# Patient Record
Sex: Female | Born: 1935 | Race: White | Hispanic: No | Marital: Single | Smoking: Never smoker
Health system: Southern US, Community
[De-identification: ages and names within clinical notes are randomized; demographics above are authoritative.]

## PROBLEM LIST (undated history)

## (undated) DIAGNOSIS — K5792 Diverticulitis of intestine, part unspecified, without perforation or abscess without bleeding: Secondary | ICD-10-CM

## (undated) DIAGNOSIS — I10 Essential (primary) hypertension: Secondary | ICD-10-CM

## (undated) DIAGNOSIS — F419 Anxiety disorder, unspecified: Secondary | ICD-10-CM

## (undated) DIAGNOSIS — R0600 Dyspnea, unspecified: Secondary | ICD-10-CM

## (undated) DIAGNOSIS — M1990 Unspecified osteoarthritis, unspecified site: Secondary | ICD-10-CM

## (undated) DIAGNOSIS — H9190 Unspecified hearing loss, unspecified ear: Secondary | ICD-10-CM

## (undated) DIAGNOSIS — F32A Depression, unspecified: Secondary | ICD-10-CM

## (undated) DIAGNOSIS — F329 Major depressive disorder, single episode, unspecified: Secondary | ICD-10-CM

## (undated) DIAGNOSIS — R609 Edema, unspecified: Secondary | ICD-10-CM

## (undated) HISTORY — PX: ABDOMINAL HYSTERECTOMY: SHX81

## (undated) HISTORY — PX: HYSTERECTOMY ABDOMINAL WITH SALPINGECTOMY: SHX6725

## (undated) HISTORY — PX: APPENDECTOMY: SHX54

## (undated) HISTORY — PX: CHOLECYSTECTOMY: SHX55

## (undated) HISTORY — PX: BACK SURGERY: SHX140

---

## 2005-07-30 ENCOUNTER — Ambulatory Visit: Payer: Self-pay | Admitting: *Deleted

## 2005-08-07 ENCOUNTER — Emergency Department: Payer: Self-pay | Admitting: Emergency Medicine

## 2005-08-07 ENCOUNTER — Other Ambulatory Visit: Payer: Self-pay

## 2005-08-11 ENCOUNTER — Ambulatory Visit: Payer: Self-pay | Admitting: *Deleted

## 2006-06-21 ENCOUNTER — Ambulatory Visit: Payer: Self-pay | Admitting: Unknown Physician Specialty

## 2006-07-13 ENCOUNTER — Other Ambulatory Visit: Payer: Self-pay

## 2006-07-13 ENCOUNTER — Inpatient Hospital Stay: Payer: Self-pay | Admitting: Unknown Physician Specialty

## 2008-04-13 ENCOUNTER — Ambulatory Visit: Payer: Self-pay | Admitting: *Deleted

## 2009-07-12 ENCOUNTER — Emergency Department: Payer: Self-pay | Admitting: Emergency Medicine

## 2011-08-24 ENCOUNTER — Emergency Department: Payer: Self-pay | Admitting: Emergency Medicine

## 2011-12-02 ENCOUNTER — Ambulatory Visit: Payer: Self-pay | Admitting: Unknown Physician Specialty

## 2012-08-25 LAB — CBC
HCT: 47 % (ref 35.0–47.0)
HGB: 15.9 g/dL (ref 12.0–16.0)
MCH: 29.4 pg (ref 26.0–34.0)
MCHC: 33.9 g/dL (ref 32.0–36.0)
MCV: 87 fL (ref 80–100)
Platelet: 344 10*3/uL (ref 150–440)
RBC: 5.41 10*6/uL — ABNORMAL HIGH (ref 3.80–5.20)
RDW: 14 % (ref 11.5–14.5)
WBC: 8.5 10*3/uL (ref 3.6–11.0)

## 2012-08-25 LAB — COMPREHENSIVE METABOLIC PANEL
Albumin: 3.6 g/dL (ref 3.4–5.0)
Alkaline Phosphatase: 92 U/L (ref 50–136)
Anion Gap: 13 (ref 7–16)
BUN: 10 mg/dL (ref 7–18)
Bilirubin,Total: 0.7 mg/dL (ref 0.2–1.0)
Calcium, Total: 8.8 mg/dL (ref 8.5–10.1)
Chloride: 104 mmol/L (ref 98–107)
Co2: 23 mmol/L (ref 21–32)
Creatinine: 1.12 mg/dL (ref 0.60–1.30)
EGFR (African American): 56 — ABNORMAL LOW
EGFR (Non-African Amer.): 48 — ABNORMAL LOW
Glucose: 93 mg/dL (ref 65–99)
Osmolality: 278 (ref 275–301)
Potassium: 3.2 mmol/L — ABNORMAL LOW (ref 3.5–5.1)
SGOT(AST): 28 U/L (ref 15–37)
SGPT (ALT): 16 U/L (ref 12–78)
Sodium: 140 mmol/L (ref 136–145)
Total Protein: 7.8 g/dL (ref 6.4–8.2)

## 2012-08-25 LAB — URINALYSIS, COMPLETE
Bilirubin,UR: NEGATIVE
Glucose,UR: NEGATIVE mg/dL (ref 0–75)
Hyaline Cast: 3
Nitrite: POSITIVE
Ph: 6 (ref 4.5–8.0)
Protein: 30
RBC,UR: 2 /HPF (ref 0–5)
Specific Gravity: 1.019 (ref 1.003–1.030)
Squamous Epithelial: 1
WBC UR: 86 /HPF (ref 0–5)

## 2012-08-25 LAB — LIPASE, BLOOD: Lipase: 140 U/L (ref 73–393)

## 2012-08-26 LAB — MAGNESIUM: Magnesium: 2 mg/dL

## 2012-08-27 ENCOUNTER — Inpatient Hospital Stay: Payer: Self-pay | Admitting: Internal Medicine

## 2012-08-27 LAB — CBC WITH DIFFERENTIAL/PLATELET
Basophil #: 0.1 10*3/uL (ref 0.0–0.1)
Basophil %: 2.2 %
Eosinophil #: 0.2 10*3/uL (ref 0.0–0.7)
Eosinophil %: 3.9 %
HCT: 40.6 % (ref 35.0–47.0)
HGB: 13.5 g/dL (ref 12.0–16.0)
Lymphocyte #: 0.9 10*3/uL — ABNORMAL LOW (ref 1.0–3.6)
Lymphocyte %: 20.2 %
MCH: 28.9 pg (ref 26.0–34.0)
MCHC: 33.3 g/dL (ref 32.0–36.0)
MCV: 87 fL (ref 80–100)
Monocyte #: 0.5 x10 3/mm (ref 0.2–0.9)
Monocyte %: 10.7 %
Neutrophil #: 2.8 10*3/uL (ref 1.4–6.5)
Neutrophil %: 63 %
Platelet: 297 10*3/uL (ref 150–440)
RBC: 4.68 10*6/uL (ref 3.80–5.20)
RDW: 14 % (ref 11.5–14.5)
WBC: 4.5 10*3/uL (ref 3.6–11.0)

## 2012-08-27 LAB — COMPREHENSIVE METABOLIC PANEL
Albumin: 2.8 g/dL — ABNORMAL LOW (ref 3.4–5.0)
Alkaline Phosphatase: 82 U/L (ref 50–136)
Anion Gap: 8 (ref 7–16)
BUN: 4 mg/dL — ABNORMAL LOW (ref 7–18)
Bilirubin,Total: 0.6 mg/dL (ref 0.2–1.0)
Calcium, Total: 7.8 mg/dL — ABNORMAL LOW (ref 8.5–10.1)
Chloride: 112 mmol/L — ABNORMAL HIGH (ref 98–107)
Co2: 25 mmol/L (ref 21–32)
Creatinine: 0.94 mg/dL (ref 0.60–1.30)
EGFR (African American): >60
EGFR (Non-African Amer.): 60 — ABNORMAL LOW
Glucose: 91 mg/dL (ref 65–99)
Osmolality: 285 (ref 275–301)
Potassium: 3.7 mmol/L (ref 3.5–5.1)
SGOT(AST): 31 U/L (ref 15–37)
SGPT (ALT): 15 U/L (ref 12–78)
Sodium: 145 mmol/L (ref 136–145)
Total Protein: 6.1 g/dL — ABNORMAL LOW (ref 6.4–8.2)

## 2012-08-27 LAB — OCCULT BLOOD X 1 CARD TO LAB, STOOL: Occult Blood, Feces: NEGATIVE

## 2012-08-27 LAB — CLOSTRIDIUM DIFFICILE BY PCR

## 2012-08-28 LAB — WBCS, STOOL

## 2012-08-28 LAB — URINE CULTURE

## 2012-08-30 LAB — STOOL CULTURE

## 2012-08-31 LAB — CULTURE, BLOOD (SINGLE)

## 2013-03-27 ENCOUNTER — Inpatient Hospital Stay: Payer: Self-pay | Admitting: Student

## 2013-03-27 LAB — COMPREHENSIVE METABOLIC PANEL
Albumin: 3.3 g/dL — ABNORMAL LOW (ref 3.4–5.0)
Alkaline Phosphatase: 97 U/L (ref 50–136)
Anion Gap: 8 (ref 7–16)
BUN: 19 mg/dL — ABNORMAL HIGH (ref 7–18)
Bilirubin,Total: 0.6 mg/dL (ref 0.2–1.0)
Calcium, Total: 8.8 mg/dL (ref 8.5–10.1)
Chloride: 102 mmol/L (ref 98–107)
Co2: 31 mmol/L (ref 21–32)
Creatinine: 1.11 mg/dL (ref 0.60–1.30)
EGFR (African American): 56 — ABNORMAL LOW
EGFR (Non-African Amer.): 49 — ABNORMAL LOW
Glucose: 109 mg/dL — ABNORMAL HIGH (ref 65–99)
Osmolality: 284 (ref 275–301)
Potassium: 3.2 mmol/L — ABNORMAL LOW (ref 3.5–5.1)
SGOT(AST): 26 U/L (ref 15–37)
SGPT (ALT): 18 U/L (ref 12–78)
Sodium: 141 mmol/L (ref 136–145)
Total Protein: 7.3 g/dL (ref 6.4–8.2)

## 2013-03-27 LAB — URINALYSIS, COMPLETE
Bilirubin,UR: NEGATIVE
Glucose,UR: NEGATIVE mg/dL (ref 0–75)
Hyaline Cast: 24
Leukocyte Esterase: NEGATIVE
Nitrite: NEGATIVE
Ph: 5 (ref 4.5–8.0)
Protein: 30
RBC,UR: 1 /HPF (ref 0–5)
Specific Gravity: 1.026 (ref 1.003–1.030)
Squamous Epithelial: 2
WBC UR: 1 /HPF (ref 0–5)

## 2013-03-27 LAB — CBC
HCT: 44.9 % (ref 35.0–47.0)
HGB: 15.2 g/dL (ref 12.0–16.0)
MCH: 28.8 pg (ref 26.0–34.0)
MCHC: 33.8 g/dL (ref 32.0–36.0)
MCV: 85 fL (ref 80–100)
Platelet: 310 10*3/uL (ref 150–440)
RBC: 5.27 10*6/uL — ABNORMAL HIGH (ref 3.80–5.20)
RDW: 13.3 % (ref 11.5–14.5)
WBC: 10.5 10*3/uL (ref 3.6–11.0)

## 2013-03-27 LAB — LIPASE, BLOOD: Lipase: 69 U/L — ABNORMAL LOW (ref 73–393)

## 2013-03-28 LAB — BASIC METABOLIC PANEL
Anion Gap: 6 — ABNORMAL LOW (ref 7–16)
BUN: 14 mg/dL (ref 7–18)
Calcium, Total: 7.9 mg/dL — ABNORMAL LOW (ref 8.5–10.1)
Chloride: 104 mmol/L (ref 98–107)
Co2: 28 mmol/L (ref 21–32)
Creatinine: 1.2 mg/dL (ref 0.60–1.30)
EGFR (African American): 51 — ABNORMAL LOW
EGFR (Non-African Amer.): 44 — ABNORMAL LOW
Glucose: 93 mg/dL (ref 65–99)
Osmolality: 276 (ref 275–301)
Potassium: 3.4 mmol/L — ABNORMAL LOW (ref 3.5–5.1)
Sodium: 138 mmol/L (ref 136–145)

## 2013-03-28 LAB — CBC WITH DIFFERENTIAL/PLATELET
Basophil #: 0 10*3/uL (ref 0.0–0.1)
Basophil %: 0.4 %
Eosinophil #: 0 10*3/uL (ref 0.0–0.7)
Eosinophil %: 0.5 %
HCT: 38.8 % (ref 35.0–47.0)
HGB: 13.2 g/dL (ref 12.0–16.0)
Lymphocyte #: 1.1 10*3/uL (ref 1.0–3.6)
Lymphocyte %: 14.7 %
MCH: 29 pg (ref 26.0–34.0)
MCHC: 34.1 g/dL (ref 32.0–36.0)
MCV: 85 fL (ref 80–100)
Monocyte #: 0.8 x10 3/mm (ref 0.2–0.9)
Monocyte %: 10.1 %
Neutrophil #: 5.8 10*3/uL (ref 1.4–6.5)
Neutrophil %: 74.3 %
Platelet: 269 10*3/uL (ref 150–440)
RBC: 4.56 10*6/uL (ref 3.80–5.20)
RDW: 13.3 % (ref 11.5–14.5)
WBC: 7.8 10*3/uL (ref 3.6–11.0)

## 2013-03-28 LAB — CLOSTRIDIUM DIFFICILE BY PCR

## 2013-03-29 LAB — BASIC METABOLIC PANEL
Anion Gap: 5 — ABNORMAL LOW (ref 7–16)
BUN: 5 mg/dL — ABNORMAL LOW (ref 7–18)
Calcium, Total: 7.7 mg/dL — ABNORMAL LOW (ref 8.5–10.1)
Chloride: 110 mmol/L — ABNORMAL HIGH (ref 98–107)
Co2: 27 mmol/L (ref 21–32)
Creatinine: 1.12 mg/dL (ref 0.60–1.30)
EGFR (African American): 56 — ABNORMAL LOW
EGFR (Non-African Amer.): 48 — ABNORMAL LOW
Glucose: 92 mg/dL (ref 65–99)
Osmolality: 280 (ref 275–301)
Potassium: 3.8 mmol/L (ref 3.5–5.1)
Sodium: 142 mmol/L (ref 136–145)

## 2013-03-29 LAB — STOOL CULTURE

## 2013-03-31 LAB — BASIC METABOLIC PANEL
Anion Gap: 6 — ABNORMAL LOW (ref 7–16)
BUN: 4 mg/dL — ABNORMAL LOW (ref 7–18)
Calcium, Total: 8 mg/dL — ABNORMAL LOW (ref 8.5–10.1)
Chloride: 109 mmol/L — ABNORMAL HIGH (ref 98–107)
Co2: 28 mmol/L (ref 21–32)
Creatinine: 0.95 mg/dL (ref 0.60–1.30)
EGFR (African American): >60
EGFR (Non-African Amer.): 59 — ABNORMAL LOW
Glucose: 109 mg/dL — ABNORMAL HIGH (ref 65–99)
Osmolality: 282 (ref 275–301)
Potassium: 3.7 mmol/L (ref 3.5–5.1)
Sodium: 143 mmol/L (ref 136–145)

## 2014-12-19 ENCOUNTER — Inpatient Hospital Stay: Payer: Self-pay | Admitting: Internal Medicine

## 2015-01-02 ENCOUNTER — Emergency Department: Payer: Self-pay | Admitting: Emergency Medicine

## 2015-01-21 ENCOUNTER — Other Ambulatory Visit: Payer: Self-pay | Admitting: Gastroenterology

## 2015-02-07 ENCOUNTER — Other Ambulatory Visit
Admit: 2015-02-07 | Disposition: A | Discharge status: Home / Self Care | Payer: Self-pay | Attending: Gastroenterology | Admitting: Gastroenterology

## 2015-02-07 LAB — CLOSTRIDIUM DIFFICILE(ARMC)

## 2015-02-19 NOTE — Consult Note (Signed)
Chief Complaint:   Subjective/Chief Complaint Patietn seen and examined chart reviewed, please see full Gi consult.  Admitted with n/v abdominal pain and reported diarrhea.  No bm since admission.  Previous EGD and colonoscopy in [id number].  Small hiatal hernia and diverticulosis and interal hemorrhoids.  No reported or noted blood in stool, black stools or mucus per patient.  Treated in the past with PPI and antispasmodics  for IBS.  Awaiting CT with contrast to rule out mesenteric ischemia.  Following.  Discussed with Dr Hilton SinclairWeiting.   Electronic Signatures: Barnetta ChapelSkulskie, Martin (MD)  (Signed 25-Oct-13 19:07)  Authored: Chief Complaint   Last Updated: 25-Oct-13 19:07 by Barnetta ChapelSkulskie, Martin (MD)

## 2015-02-19 NOTE — Consult Note (Signed)
Brief Consult Note: Diagnosis: GI consultation for recurrent N/V/D.  In the setting of known history of IBS.  Personal history of colonic polyps -TA.  Generalized abdominal pain with more localized discomfort to epigastrium and left lower quadrant.  UTI, acute on chronic.  Hypokalemia.   Consult note dictated.   Discussed with Attending MD.   Comments: Pateint's presentation discussed with Dr. Barnetta ChapelMartin Skulskie.  CT scan angiography ordered for mesentric involvement, ischemia.  Pending results.  Recognize personal history of adenomatous colonic polyps but recommendation for colonoscopy is on an outpatient at this point in time.  Given upper GI symptoms, recommendation is for patient to be placed on PPI therapy as NERD a consideration.  Continued antibiotic therapy.  Pending stool study results.  Electronic Signatures: Rodman KeyHarrison, Dawn S (NP)  (Signed 25-Oct-13 15:24)  Authored: Brief Consult Note   Last Updated: 25-Oct-13 15:24 by Rodman KeyHarrison, Dawn S (NP)

## 2015-02-19 NOTE — H&P (Signed)
PATIENT NAME:  Marilyn Andrews, Marilyn Andrews MR#:  [id number] DATE OF BIRTH:  10/04/1936  DATE OF ADMISSION:  08/26/2012  PRIMARY CARE PHYSICIAN: [facility]   CHIEF COMPLAINT: Intractable nausea, vomiting, and diarrhea associated with abdominal pain.   HISTORY OF PRESENT ILLNESS: The patient is a 79 year old female with a past medical history of irritable bowel syndrome, diverticulosis, hypertension, internal and external hemorrhoids, rheumatic fever with previous normal echocardiogram approximately in the year 2001, anxiety, chronic history of gastritis and duodenitis as per upper endoscopy in the year 2004, dysphagia and TIA who is presenting to the ER with the chief complaint of one week history of nausea and vomiting associated with diarrhea and abdominal pain. The patient is reporting that she is feeling sick and weak as she has been vomiting and having diarrhea for the past one week. Vomit was whitish-yellow in color, but denies any blood. Diarrhea was watery but no blood. The patient is so much worried that she vomits soon after she eats and also she has loose watery bowel movement 10 to 15 minutes after eating. She is saying that this is an ongoing problem for several years and was seen by gastroenterology, Dr. Mechele Collin.  The patient had an EGD and colonoscopy done in the past and she was diagnosed with polyps, as reported by the patient. The patient denies any recent history of travel or sick contacts. She is not quite sure if she took some antibiotics in the past for six weeks or not. The patient is complaining of diffuse abdominal pain, but more in the epigastric and left lower quadrant area. The patient's daughter-in-law and caregiver were at bedside and the patient's caregiver was worried as the patient has not been eating or drinking and this is an ongoing, recurrent problem for the past several years. The patient is requesting to continue her anxiety medication as she has history of anxiety. CAT scan of the  abdomen and pelvis was done in the ER as the patient is complaining of abdominal pain, but no acute findings were identified.  The patient is diagnosed with a urinary tract infection and hospitalist team is contacted for a patient with acute gastroenteritis and urinary tract infection.   PAST MEDICAL HISTORY:  1. Hypertension. 2. Irritable bowel syndrome. 3. Transient ischemic attack. 4. Anxiety disorder. 5. Gastritis/duodenitis diagnosed by 2004 upper endoscopy. 6. Diverticulosis/diverticulitis. 7. Rheumatic fever with previous normal echocardiogram approximately in the year 2001. 8. History of dysphagia with hoarseness with negative barium swallow in the year 2004.  PAST SURGICAL HISTORY:  1. Back surgery x2. 2. Right knee surgery in 1998. 3. Upper endoscopy in the year 2004. 4. Colonoscopy by Dr. Henrene Hawking in the year 2002, notable for diverticulosis. 5. Bladder tack with hysterectomy surgery. 6. Cholecystectomy. 7. Vaginal hysterectomy with unilateral salpingo-oophorectomy for uterine prolapse.  SOCIAL HISTORY: Denies smoking or alcohol. Living with caretaker for the past 40 years.   DRUG ALLERGIES: Penicillin and morphine.   HOME MEDICATIONS: 1. Tylenol extra-strength 2 tablets p.o. every 6 hours. 2. Norvasc 5 mg p.o. once daily. 3. Lexapro 20 mg one tablet p.o. once daily. 4. Fluticasone 50 mcg two sprays to each nostril once daily. 5. Clonazepam 0.5 mg one tablet p.o. twice a day for anxiety.  FAMILY HISTORY: Reviewed. Denies any diabetes mellitus or heart problems. Denies any history of cancer.   REVIEW OF SYSTEMS: CONSTITUTIONAL: The patient is complaining of fatigue and weakness but denies any fever. Denies any weight loss. EYES: Denies blurry vision or hearing loss.  Denies any epistaxis, discharge, snoring, or postnasal drip. RESPIRATORY: Denies coughing, wheezing, hemoptysis, or dyspnea. CARDIOVASCULAR: Denies any chest pain, orthopnea, edema, dyspnea on exertion, or  palpitations. GASTROINTESTINAL: Soft. Bowel sounds are positive in all four quadrants. Diffuse tenderness is present but no rebound tenderness. More tender in the epigastric and left lower quadrant area. No masses felt. ENDOCRINE: Denies polyuria, dysarthria, nocturia. NEURO: Awake, alert and oriented x3. Denies any vertigo, tremors, or ataxia. Denies any dysarthria.     PHYSICAL EXAMINATION:  VITALS: Temperature 97.9 degrees Fahrenheit, pulse 89, respiratory rate 18, and blood pressure 167/80.  GENERAL APPEARANCE: The patient looks dehydrated with dry mucous membranes.   HEENT: Normocephalic, atraumatic. Pupils are equally round and reactive to light and accommodation. No scleral icterus.  NECK: Supple. No JVD.   PULMONARY: Lungs are clear to auscultation bilaterally.   CARDIAC: S1 and S2 normal. Regular rate and rhythm.   ABDOMEN: Soft. Bowel sounds are positive in all four quadrants. Positive epigastric tenderness and left lower quadrant tenderness. No rebound tenderness.   NEURO: Awake, alert, and oriented x3. Cranial nerves II through XII grossly intact. Normal reflexes.   EXTREMITIES: No edema. No cyanosis. No clubbing.   LABS/IMAGING STUDIES: Glucose 93, sodium 140, potassium 3.2, chloride 104, CO2 23, BUN 10, creatinine 1.12.  White count 8,500, hemoglobin 15.9, hematocrit 7.0, platelet count 344,000.  Of note, the patient's hemoglobin was high in October 2012 and it was at 15.1 during that time also.   Urinalysis shows 1+ ketones, 2+ leukocyte esterase, positive nitrites.  CT of the abdomen has revealed no acute abnormality.  ASSESSMENT AND PLAN:  1. Acute gastroenteritis. 2. Acute respiratory distress. 3. Dehydration. 4. Hypokalemia.  SECONDARY ADMITTING DIAGNOSES: 1. Irritable bowel syndrome. 2. Chronic history of transient ischemic attack. 3. History of hypertension. 4. History of rheumatic fever with previous normal echocardiogram. 5. Anxiety with  depression 6. Transient ischemic attack. 7. Dysphagia with hoarseness with negative barium swallow in the year 2004.  PLAN:  1. Admit to observation status. 2. IV fluids, D5 0.5 normal with 20 mL potassium.  3. Check CBC in the a.m. 4. Check stool culture, Hemoccult, stool for WBC.  5. The patient will be placed on enteric precautions. 6. If these symptoms are recurring,and  no improvement in the next 24 hours  we will consider gastroenterology consultation. 7. Zofran on a p.r.n. basis. 8. We will continue anxiety medication, Klonopin, which is her home medication. 9. If there is no improvement in clinical situation, please consider adding gastroenterology consultation in the a.m. We will provide GI prophylaxis with Protonix IV. 10. DVT prophylaxis with Lovenox subcutaneous.   CODE STATUS: FULL CODE.  The assessment and plan of care was discussed in detail with the patient and caretaker who is present and they voiced their understanding of the plan.   TOTAL TIME SPENT: 55 minutes.  ____________________________ Ramonita LabAruna Deberah Adolf, MD ag:slb D: 08/26/2012 02:24:20 ET     T: 08/26/2012 07:57:06 ET        JOB#: [id number] cc: Ramonita LabAruna Ladelle Teodoro, MD, <Dictator> [facility] Ramonita LabARUNA Sherrel Ploch MD ELECTRONICALLY SIGNED 08/28/2012 0:51

## 2015-02-19 NOTE — Consult Note (Signed)
Chief Complaint:   Subjective/Chief Complaint some nausea today, no emesis.  some loose stool.   VITAL SIGNS/ANCILLARY NOTES: **Vital Signs.:   26-Oct-13 14:09   Vital Signs Type Routine   Temperature Temperature (F) 98.3   Celsius 36.8   Temperature Source Oral   Pulse Pulse 68   Respirations Respirations 18   Systolic BP Systolic BP 563   Diastolic BP (mmHg) Diastolic BP (mmHg) 80   Mean BP 95   Pulse Ox % Pulse Ox % 95   Pulse Ox Activity Level  At rest   Oxygen Delivery Room Air/ 21 %  *Intake and Output.:   26-Oct-13 09:08   Stool  liquid brown   Brief Assessment:   Cardiac Regular    Respiratory clear BS    Gastrointestinal details normal Soft  Nondistended  Bowel sounds normal  mild to moderate diffuse tenderness to palpation   Lab Results: Hepatic:  26-Oct-13 06:04    Bilirubin, Total 0.6   Alkaline Phosphatase 82   SGPT (ALT) 15   SGOT (AST) 31   Total Protein, Serum  6.1   Albumin, Serum  2.8  Routine Micro:  26-Oct-13 09:42    Micro Text Report CLOS.DIFF ASSAY, RT-PCR   COMMENT                   POSITIVE-CLOS.DIFFICILE TOXIN DETECTED BY PCR   ANTIBIOTIC                        Clostridium Diff Toxin by RT-PCR POSITIVE-CLOS.DIFFICILE TOXIN DETECTED BY PCR ---------------------------------- Test procedure integrates sample purification, nucleic acid amplification, and detection of the target Clostridium difficile sequence in simple or complex samples using real-time PCR and RT-PCR assays.  Routine Chem:  26-Oct-13 06:04    Glucose, Serum 91   BUN  4   Creatinine (comp) 0.94   Sodium, Serum 145   Potassium, Serum 3.7   Chloride, Serum  112   CO2, Serum 25   Calcium (Total), Serum  7.8   Osmolality (calc) 285   eGFR (African American) >60   eGFR (Non-African American)  60 (eGFR values <68m/min/1.73 m2 may be an indication of chronic kidney disease (CKD). Calculated eGFR is useful in patients with stable renal function. The eGFR calculation  will not be reliable in acutely ill patients when serum creatinine is changing rapidly. It is not useful in  patients on dialysis. The eGFR calculation may not be applicable to patients at the low and high extremes of body sizes, pregnant women, and vegetarians.)   Anion Gap 8    09:42    Result Comment POSITIVE C. DIFFICILE - NOTIFIED OF CRITICAL VALUE  - NYO TO LESLIE CULVERSON/1303/08-26-12  - READ-BACK PROCESS PERFORMED.  - COPY TO INFECTION CONTROL  Result(s) reported on 27 Aug 2012 at 01:05PM.  Routine Hem:  26-Oct-13 06:04    WBC (CBC) 4.5   RBC (CBC) 4.68   Hemoglobin (CBC) 13.5   Hematocrit (CBC) 40.6   Platelet Count (CBC) 297   MCV 87   MCH 28.9   MCHC 33.3   RDW 14.0   Neutrophil % 63.0   Lymphocyte % 20.2   Monocyte % 10.7   Eosinophil % 3.9   Basophil % 2.2   Neutrophil # 2.8   Lymphocyte #  0.9   Monocyte # 0.5   Eosinophil # 0.2   Basophil # 0.1 (Result(s) reported on 27 Aug 2012 at 07:03AM.)   Assessment/Plan:  Assessment/Plan:   Assessment 1) abdominal pain, n/v loose stools-some amount of chronic issues for several years, following with Dr Vira Agar as o/p.  Currently C. Diff positive and now on flagyl.    Plan 1) continue flagyl, awaiting result of CTA/   Electronic Signatures: Loistine Simas (MD)  (Signed 26-Oct-13 15:21)  Authored: Chief Complaint, VITAL SIGNS/ANCILLARY NOTES, Brief Assessment, Lab Results, Assessment/Plan   Last Updated: 26-Oct-13 15:21 by Loistine Simas (MD)

## 2015-02-19 NOTE — Consult Note (Signed)
Chief Complaint:   Subjective/Chief Complaint feeling better, no nausea or vomiting, abdominal apin improving, continues some loose stools.   VITAL SIGNS/ANCILLARY NOTES: **Vital Signs.:   27-Oct-13 14:45   Vital Signs Type Routine   Temperature Temperature (F) 98.2   Celsius 36.7   Temperature Source Oral   Pulse Pulse 68   Respirations Respirations 20   Systolic BP Systolic BP 161   Diastolic BP (mmHg) Diastolic BP (mmHg) 76   Mean BP 104   Pulse Ox % Pulse Ox % 94   Pulse Ox Activity Level  At rest   Oxygen Delivery Room Air/ 21 %  *Intake and Output.:   27-Oct-13 12:15   Stool  small brown mucus like stool   Brief Assessment:   Cardiac Regular    Respiratory clear BS    Gastrointestinal details normal Soft  Nontender  Nondistended  No masses palpable  Bowel sounds normal   Lab Results: Routine Micro:  27-Oct-13 10:05    Micro Text Report WBCS, STOOL   COMMENT                   NO RBC'S OR WBC'S SEEN   ANTIBIOTIC                        Comment .1. NO RBC'S OR WBC'S SEEN  Result(s) reported on 28 Aug 2012 at 03:19PM.  Routine Chem:  26-Oct-13 09:42    Result Comment POSITIVE C. DIFFICILE - NOTIFIED OF CRITICAL VALUE  - NYO TO LESLIE CULVERSON/1303/08-26-12  - READ-BACK PROCESS PERFORMED.  - COPY TO INFECTION CONTROL  Result(s) reported on 27 Aug 2012 at 01:05PM.  Routine Sero:  26-Oct-13 09:42    Occult Blood, Feces NEGATIVE (Result(s) reported on 27 Aug 2012 at 11:04PM.)   Assessment/Plan:  Assessment/Plan:   Assessment 1) abdominal pain n/v diarrhea-positive C diff-now on metronidazole, feeling some better.    2) history of dyspepsia and IBS    Plan 1) continue current, change metronidazole to po tomorrow. add FLORASTOR tomorrow 500 mg po bid.   Electronic Signatures: Barnetta ChapelSkulskie, Vyncent Overby (MD)  (Signed 27-Oct-13 17:06)  Authored: Chief Complaint, VITAL SIGNS/ANCILLARY NOTES, Brief Assessment, Lab Results, Assessment/Plan   Last Updated: 27-Oct-13 17:06  by Barnetta ChapelSkulskie, Yurem Viner (MD)

## 2015-02-19 NOTE — Consult Note (Signed)
PATIENT NAME:  Marilyn Andrews, Marilyn Andrews MR#:  [id number] DATE OF BIRTH:  September 02, 1936  DATE OF CONSULTATION:  08/26/2012  REFERRING PHYSICIAN:  Loletha Grayer, MD   CONSULTING PHYSICIAN:  Loistine Simas, MD/Dawn Ruthe Mannan, NP PRIMARY CARE PHYSICIAN: [facility] FOR CONSULTATION: Nausea, vomiting, abdominal pain.    HISTORY OF PRESENT ILLNESS: Ms. Lenart is a 79 year old Caucasian female with significant past medical history of irritable bowel syndrome, diverticulosis, hypertension, internal and external hemorrhoids, rheumatic fever with previous normal echocardiogram in 2001,  anxiety, chronic history of gastritis and duodenitis by upper endoscopy in 2004, dysphagia, and transient ischemic attack. Additionally, medical history is significant for personal history of colonic polyps, adenomatous one polyp was resected from ascending colon. Colonoscopy was performed by Dr. Gaylyn Cheers on 07/15/2006. Size of polyp was 4 mm.   The patient states that the symptoms which we are being consulted for are very similar in presentation. For years and years, the patient states that she has always suffered from nausea as well as intermittent episodes of vomiting. The patient says every time that she eats she ends up having a bowel movement on days in which she is experiencing loose stools, diarrhea. Other times she can go two to three days without a bowel movement. Previously, she had been prescribed NuLev 0.125 mg tablets to use sublingually as needed for pain by Dr. Vira Agar. She has not been seen in our office for several months. No rectal bleeding. No nausea, no vomiting. No melena. The vomiting does not occur on a daily basis, but she does state that she is "sick to her stomach on a daily basis," experiencing heartburn as well as dyspepsia symptoms at times. She denies any difficulty swallowing.   She has been treated intermittently for recurrent urinary tract infections. Significant for dysuria. She is unable to  recall the last time in which she was given antibiotic therapy other than being admitted at this time. She had a CT scan of the abdomen and pelvis without contrast done during her ER evaluation prior to admission which showed atelectasis versus infiltrate within the lung bases, otherwise no evidence of obstructive or inflammatory abnormalities.   LABORATORY, DIAGNOSTIC AND RADIOLOGICAL DATA: Laboratory evaluation thus far: Chemistry panel: Potassium was 3.2 EGFR was 48, lipase was 140, otherwise within normal limits. Magnesium is 2.0. Hepatic panel within normal limits. CBC: RBC is 5.41, otherwise within normal limits. Urinalysis:  +1 blood, +1 ketones, positive for nitrites, 30 mg/dL of protein, WBC 86 per high-power field, RBC is 2 per high-power field, +3 bacteria, and hyaline casts are 3 per low power field. EKG: Sinus rhythm. A colonoscopy was done 07/15/2006 with finding of one polyp in the ascending colon as previously noted, diverticulosis and internal hemorrhoids. EGD was done as well on this date which showed a small hiatal hernia. Stomach was normal, duodenum was normal. Stomach biopsies revealed gastric mucosa fundic type, no specific history of pathological findings, no  Helicobacter pylori organisms noted.   PAST MEDICAL HISTORY:  1. Hypertension. 2. Irritable bowel.  3. Transient ischemic attack.  4. Anxiety disorder. 5. Cataracts. 6. Gastritis/ duodenitis diagnosed is 2004 on upper endoscopy with reconfirmation on 2007.  7. Diverticulosis/diverticulitis.  8. Rheumatic fever with previous normal echocardiogram in 2001.  9. Dysphagia and hoarseness with negative barium swallow in 2004.  10. Personal history of colonic polyps.   PAST SURGICAL HISTORY:  1. Colonoscopy in 2002 by Dr. Rhona Leavens found diverticulosis. 2. Colonoscopy by Dr. Gaylyn Cheers with findings  of diverticulosis, internal hemorrhoids and one polyp being resected revealing to be tubular adenomatous.  3. Back  surgery x2.  4. Right knee surgery in 1998. 5. Upper endoscopy in 2004. 6. Upper endoscopy in 2007. 7. Colonoscopy in 2002. 8. Colonoscopy in 2007. 9. Bladder tack with hysterectomy. 10. Cholecystectomy and vaginal hysterectomy with unilateral oophorectomy for uterine prolapse.   FAMILY HISTORY: Brother with history of stomach cancer diagnosed at the age of 77. Aunt with breast cancer. Uncle with prostate cancer. No family history of colonic polyps.   SOCIAL HISTORY: No tobacco. No alcohol use. Living with a caretaker for the past 40 years.   ALLERGIES: Penicillin and morphine.  HOME MEDICATIONS:  1. Tylenol Extra Strength 2 tablets every 6 hours. 2. Norvasc 5 mg a day. 3. Lexapro 20 mg a day. 4. Fluconazole  58 mcg two sprays each nostril once a day.  5. Clonazepam 0.5 mg tablet b.i.d.     REVIEW OF SYSTEMS: CONSTITUTIONAL: Significant for fatigue, weakness. Denies any weight loss. EYES: No blurred vision, double vision. No epistaxis. Significant for nasal congestion. She states she has recently undergone evaluation by Dr. Tami Ribas. RESPIRATORY: No coughing, no wheezing, hemoptysis. CARDIOVASCULAR: No chest pain, orthopnea, heart palpitations. GI: See history of present illness. GU: Significant for dysuria, hesitancy with voiding. ENDOCRINE: No polyuria, polydipsia. NEUROLOGIC: History of transient ischemic attack. No history of seizure disorder. Denies headaches, some mild dizziness with feeling bad at this time. PSYCHIATRIC: Significant for anxiety. Otherwise, stated all 10 systems reviewed and checked, otherwise unremarkable as stated above   PHYSICAL EXAMINATION:  VITAL SIGNS: Temperature 98.0, pulse 65, respirations 20, blood pressure 156/84, pulse oximetry 93% on room air.   GENERAL: Well-developed, well-nourished 79 year old Caucasian female, no acute distress noted, appear pleasant, resting in bed grandchildren at bedside.   HEENT: Normocephalic, atraumatic. Pupils are equal,  reactive to light. Conjunctivae are clear. Sclerae are anicteric.   NECK: Supple. Trachea midline. No lymphadenopathy or thyromegaly.   PULMONARY: Symmetric rise and fall of chest. Clear to auscultation throughout.   CARDIOVASCULAR: Regular rate and rhythm, S1, S2. No murmurs, no gallops.   ABDOMEN: Soft, nondistended. Hypoactive bowel sounds. Moderate discomfort noted in the left lower quadrant as well as epigastric.   RECTAL: Deferred.   MUSCULOSKELETAL: Moving all four extremities. No contractures. No clubbing.  EXTREMITIES: No edema.   PSYCHIATRIC: Alert and oriented x4. Flat affect. Depressive mood.    NEUROLOGICAL: No gross neurological deficits   IMPRESSION:  1. Long-standing history of intractable nausea and vomiting.  2. History of gastritis as well as duodenitis.  3. Personal history of colonic polyps, adenomatous.  4. Long-standing history of abdominal pain as well which is probably in correlation with history of irritable bowel syndrome.  5. Diarrhea, which she was experiencing Tuesday of this week has since resolved. She has been able to defecate since being admitted.  6. Urinary tract infection. Known history of recurrent UTIs. 7. Hypertension.   PLAN:  1. The patient's presentation will be discussed with Dr. Loistine Simas. Pending stool study results of comprehensive stool occult blood feces, WBC fecal as well as C. difficile toxin A and B.  2. The patient is currently on Levaquin 750 mg IV every 48 hours for treatment of urinary tract infection. I do feel the patient would warrant endoscopic evaluation, upper endoscopy as well as a colonoscopy given her history of tubular adenomatous polyp. Timeframe to be decided whether to proceed as an inpatient versus an outpatient. Depending on the patient's  progress. We will continue to monitor.   These services provided by Payton Emerald, MS, APRN, [facility], FNP,  under collaborative agreement with Loistine Simas, MD.      ____________________________ Payton Emerald, NP dsh:cbb D: 08/26/2012 15:11:52 ET T: 08/26/2012 16:24:04 ET JOB#: [id number]  cc: Payton Emerald, NP, <Dictator> Payton Emerald MD ELECTRONICALLY SIGNED 09/05/2012 7:55

## 2015-02-19 NOTE — Consult Note (Signed)
Chief Complaint:   Subjective/Chief Complaint patient not feeling as well today, some mile nausea, denies abdominal pain currently.   VITAL SIGNS/ANCILLARY NOTES: **Vital Signs.:   28-Oct-13 08:40   Vital Signs Type Routine   Temperature Temperature (F) 98.2   Celsius 36.7   Temperature Source oral   Pulse Pulse 74   Respirations Respirations 20   Systolic BP Systolic BP 152   Diastolic BP (mmHg) Diastolic BP (mmHg) 69   Mean BP 96   Pulse Ox % Pulse Ox % 92   Pulse Ox Activity Level  At rest   Oxygen Delivery Room Air/ 21 %   Brief Assessment:   Cardiac Regular    Respiratory clear BS    Gastrointestinal details normal Soft  Nontender  Nondistended  No masses palpable  Bowel sounds normal   Assessment/Plan:  Assessment/Plan:   Assessment 1) n/v abdominal apin in the setting of new diagnosis of C diff.  increased nausea today, probably a effect of metronizole.    Plan 1) will change metronidazole to 250 mg po qid.  if nausea continues, may need to change to vanco or dificid.  following   Electronic Signatures: Barnetta ChapelSkulskie, Saide Lanuza (MD)  (Signed 28-Oct-13 15:47)  Authored: Chief Complaint, VITAL SIGNS/ANCILLARY NOTES, Brief Assessment, Assessment/Plan   Last Updated: 28-Oct-13 15:47 by Barnetta ChapelSkulskie, Ivylynn Hoppes (MD)

## 2015-02-19 NOTE — Consult Note (Signed)
Chief Complaint:   Subjective/Chief Complaint feeling better today, minimal nausea, no emesis, tolerating po.  stools soft forming.  no diarrhea. minimal llq abdominal discomfort.   VITAL SIGNS/ANCILLARY NOTES: **Vital Signs.:   29-Oct-13 14:48   Vital Signs Type Routine   Temperature Temperature (F) 98.7   Celsius 37   Temperature Source oral   Pulse Pulse 69   Respirations Respirations 18   Systolic BP Systolic BP 110   Diastolic BP (mmHg) Diastolic BP (mmHg) 70   Mean BP 83   Pulse Ox % Pulse Ox % 93   Pulse Ox Activity Level  At rest   Oxygen Delivery Room Air/ 21 %   Brief Assessment:   Cardiac Regular    Respiratory clear BS    Gastrointestinal details normal Soft  Nontender  Nondistended  No masses palpable  Bowel sounds normal   Assessment/Plan:  Assessment/Plan:   Assessment 1) abdominal pain, nausea, vomiting, diarrhea-improved.   2) c. diff diarrhea-improving on flagyl, now on 250 q 6 hours.    Plan 1) continue flagyl as above for 10 day course, taper one dose every 3 days until off.  Add FLORASTOR  500mg  po bid starting tomorrow, continue for one month post abx.  Will need antinausea meds for home, could d/c tomorrow, GI fu in 2 weeks.   Electronic Signatures: Barnetta ChapelSkulskie, Cyril Woodmansee (MD)  (Signed 29-Oct-13 18:54)  Authored: Chief Complaint, VITAL SIGNS/ANCILLARY NOTES, Brief Assessment, Assessment/Plan   Last Updated: 29-Oct-13 18:54 by Barnetta ChapelSkulskie, Gloria Ricardo (MD)

## 2015-02-19 NOTE — Discharge Summary (Signed)
PATIENT NAME:  Marilyn Andrews, Marilyn Andrews MR#:  [id number] DATE OF BIRTH:  Feb 09, 1936  DATE OF ADMISSION:  08/27/2012 DATE OF DISCHARGE:  08/31/2012  ADMISSION DIAGNOSIS: Intractable nausea, vomiting, and diarrhea with abdominal pain.   DISCHARGE DIAGNOSES:  1. Intractable nausea, vomiting, diarrhea due to Clostridium difficile colitis, now symptoms resolved.  2. Anxiety/depression.  3. Hypertension.  4. Electrolyte imbalances, status post replacement.  5. Urinary tract infection, status post treatment with antibiotics.  6. History of irritable bowel syndrome.  7. Previous history of transient ischemic attack.  8. Anxiety disorder.  9. History of gastritis, duodenitis in 2004.  10. History of diverticulosis and diverticulitis.  11. Rheumatic fever with previous normal echogram in 2001.  12. History of dysphagia with hoarseness, with negative barium swallow study in 2004.   CONSULTANTS: Dr. Marva PandaSkulskie.  PERTINENT LABORATORY, DIAGNOSTIC AND RADIOLOGICAL DATA: Admitting glucose was 93, BUN 10, creatinine was 1.12, sodium 140, potassium 3.2, chloride 104, CO2 23, calcium 8.8. LFTs were normal. WBC 8.5, hemoglobin 15.9, platelet count was 344. Urine culture showed greater than 100,000 Escherichia coli pansensitive. C. difficile was positive on October 26th. Stool cultures were negative. Urinalysis showed 2+ leukocytes, WBCs 86, bacteria 3+.   HOSPITAL COURSE: Please refer to History and Physical done by the admitting physician. The patient is a 79 year old female with past medical history of irritable bowel syndrome, diverticulitis, hypertension, internal/external hemorrhoids, rheumatic fever with previous history of normal echocardiogram, presented to the ED with history of nausea and vomiting associated with diarrhea. The patient also was having whitish-yellowish colored emesis. She was evaluated in the ED, and we were asked to admit the patient. She also had a CT scan of the abdomen and pelvis which showed no  acute abnormality. The patient was admitted for further evaluation. After being hospitalized for a few days, her stool cultures did come back positive for Clostridium difficile. The patient was treated with p.Andrews. vancomycin with resolution of her diarrhea. She also had a CT angiogram which was negative. The patient had to be switched over to p.Andrews. Flagyl in light of her inability to afford oral vancomycin. The patient on discharge was doing much better and is stable for discharge.   DISCHARGE MEDICATIONS:  1. Clonazepam 0.5, 1 tab p.Andrews. b.i.d. as needed. 2. Lexapro 20 mg daily.  3. Norvasc 5 mg daily.  4. Triamcinolone 0.1% topical cream, apply to affected area t.i.d.  5. Fluticasone 50 mcg, 2 sprays once a day.  6. Tylenol 650 mg every 6 hours p.r.n. for pain. 7. Flagyl 500 mg, 1 tab p.Andrews. every 8 hours to finish a course as written.  8. Promethazine 12.5 every 6 hours p.r.n. nausea. 9. Florastor 250, 2 caps b.i.d.  for 30 days.   DIET: Low sodium, low residual, regular consistency.   ACTIVITY: As tolerated.   FOLLOWUP: Follow up with [facility] in 1 to 2 weeks.   TIME SPENT:   35 minutes. ____________________________ Lacie ScottsShreyang H. Allena KatzPatel, MD shp:cbb D: 09/01/2012 14:49:37 ET T: 09/01/2012 18:18:33 ET JOB#: [id number]  cc: Tynetta Bachmann H. Allena KatzPatel, MD, <Dictator> [facility] Molinda BailiffH Nolia Tschantz MD ELECTRONICALLY SIGNED 09/02/2012 17:35

## 2015-02-22 NOTE — Discharge Summary (Signed)
PATIENT NAME:  Marilyn Andrews, Marilyn Andrews MR#:  [id number] DATE OF BIRTH:  July 02, 1936  DATE OF ADMISSION:  03/27/2013  DATArby Barrette OF DISCHARGE:  03/31/2013  PRIMARY CARE PHYSICIAN:  Dr. Ulanda EdisonEmma Williams.    CHIEF COMPLAINT:  Nausea, vomiting, abdominal pain.   DISCHARGE DIAGNOSES: 1.  Clostridium difficile colitis.  2.  History of Clostridium difficile in the past.  3.  Dehydration.  4.  Hypokalemia.  5.  Hypertension.  6.  Irritable bowel syndrome.  7.  History of depression.  8.  History of back surgery and cholecystectomy.   DISCHARGE MEDICATIONS:  1.  Flagyl 500 mg q.8h, to be taken for a total of 14 days.  2.  Clonazepam 0.5 mg 1 tab 2 times a day.  3.  Lexapro 20 mg daily.  4.  Norvasc 5 mg daily.  5.  Triamcinolone topical 0.1%, apply to effected area 3 times a day as needed for dryness.  6.  Fluticasone 50 mcg nasal spray 2 sprays once a day.  7.  Promethazine 12.5 mg every 6 hours as needed for nausea.  8.  Florastor 250 mg 2 caps 2 times a day.   DIET:  Low sodium.   ACTIVITY:  As tolerated.   FOLLOWUP:  Please follow with PCP within 1 to 2 weeks. Please follow to gastroenterologist within 1 to 2 weeks. If diarrhea comes back, call your doctor right away.   HISTORY OF PRESENT ILLNESS AND HOSPITAL COURSE:  For full details of H and P, please the dictation on 03/27/2013, but briefly this is a morbidly obese 79 year old female with history of C. diff in the past, irritable bowel syndrome, hypertension, who came in for the above chief complaint. Of note, the patient had recent UTI symptoms and was started on an antibiotic. She was admitted to the hospitalist service for observation. Stools on postop were sent. The patient was started on some IV fluids and electrolytes was repleted. Stool cultures came back positive for C. diff and was, therefore, started on Flagyl. The patient was treated with this and slowly got better. She has been diarrhea free for greater than 24 hours. I discussed with the  patient the need for being not very liberal with antibiotics and telling her PCP about a history of C. diff x 2 in the past year or so now. At this point, she is tolerating advanced diet, blood pressure remains stable and her diarrhea appears to have resolved and will be discharged.   ____________________________ Krystal EatonShayiq Reggie Welge, MD sa:nts D: 03/31/2013 18:44:58 ET T: 04/01/2013 10:47:53 ET JOB#: [id number]  cc: Krystal EatonShayiq Brock Larmon, MD, <Dictator> Lucillie GarfinkelEmma R. Mayford KnifeWilliams, MD Krystal EatonSHAYIQ Adonia Porada MD ELECTRONICALLY SIGNED 04/28/2013 20:56

## 2015-02-22 NOTE — H&P (Signed)
PATIENT NAME:  Marilyn BarretteWARD, Izzabell O MR#:  [id number] DATE OF BIRTH:  Dec 26, 1935  DATE OF ADMISSION:  03/27/2013  REFERRING PHYSICIAN: Dr. Cyril LoosenKinner  [facility].   REASON FOR ADMISSION: Abdominal pain with nausea and vomiting.   HISTORY OF PRESENT ILLNESS: The patient is a 79 year old female, who lives with her sister with a history of depression, irritable bowel syndrome and hypertension. Presents to the Emergency Room with a two-day history of constipation associated with nausea, vomiting, and abdominal pain. In the Emergency Room, the patient was given  IV Dilaudid and IV Zofran with no improvement of her symptoms. She is now admitted for further evaluation.   PAST MEDICAL HISTORY: 1.  Irritable bowel syndrome.  2.  History of C. difficile colitis.  3.  Benign hypertension.  4.  Depression.  5.  Status post cholecystectomy.  6.  Status post hysterectomy.  7.  Status post back surgery.   MEDICATIONS: 1.  Norvasc 5 mg p.o. daily.  2.  Flonase 2 puffs each nostril daily.  3.  Lexapro 20 mg p.o. daily.  4.  Klonopin 0.5 mg p.o. b.i.d.  5.  Probiotic one p.o. b.i.d.   ALLERGIES: PENICILLIN.   SOCIAL HISTORY: The patient lives with her sister. No history of alcohol or tobacco abuse.   FAMILY HISTORY: Positive for hypertension and coronary artery disease. Negative for diabetes, colon cancer or breast cancer.   REVIEW OF SYSTEMS: CONSTITUTIONAL: No fever or change in weight.  EYES: No blurred or double vision. No glaucoma.  ENT: No tinnitus or hearing loss. No nasal discharge or bleeding. No difficulty swallowing.  RESPIRATORY: No cough or wheezing. Denies hemoptysis. No painful respiration.  CARDIOVASCULAR: No chest pain or orthopnea. No palpitations or syncope.  GASTROINTESTINAL: No diarrhea. No melena or bright red blood per rectum. Denies hematemesis.  GENITOURINARY: No dysuria or hematuria. No incontinence.  ENDOCRINE: No polyuria or polydipsia. No heat or cold  intolerance.  HEMATOLOGIC: The patient denies anemia, easy bruising or bleeding.  LYMPHATIC: No swollen glands.  MUSCULOSKELETAL: The patient denies pain in her neck, back, shoulders, knees or hips. No gout.  NEUROLOGIC: No numbness or migraines. Does have weakness. Denies stroke or seizures.  PSYCHIATRIC: The patient denies anxiety, insomnia or depression.   PHYSICAL EXAMINATION: GENERAL: The patient is in no acute distress.  VITAL SIGNS: Vital signs are currently remarkable for a blood pressure of 124/61, with a heart rate of 90 and a respiratory rate of 18. She is afebrile.  HEENT: Normocephalic, atraumatic. Pupils equally round and reactive to light and accommodation. Extraocular movements are intact. Sclerae anicteric. Conjunctivae are clear.  Oropharynx is clear.    NECK: Supple without jugular venous distention or bruits. No adenopathy or thyromegaly was noted.  LUNGS: Clear to auscultation and percussion without wheezes, rales or rhonchi. No dullness. Respiratory effort is normal.  CARDIAC: Regular rate and rhythm with normal S1, S2. No significant rubs, murmurs or gallops. PMI is nondisplaced. Chest wall is nontender.  ABDOMEN: Soft, but tender in the epigastrium and left upper quadrant. No rebound or guarding. Normoactive bowel sounds. No organomegaly or masses were appreciated. No hernias or bruits were noted.  EXTREMITIES: Without clubbing, cyanosis or edema. Pulses were 2+ bilaterally.  SKIN: Warm and dry without rash or lesions.   NEUROLOGIC: Cranial nerves II through XII grossly intact. Deep tendon reflexes were symmetric. Motor and sensory examination is nonfocal.   PSYCHIATRIC: Revealed the patient was alert and oriented to person, place and time. She was  cooperative and used good judgment.   LABORATORY, RADIOLOGICAL AND DIAGNOSTIC DATA: Lipase was 69 with a glucose of 109 and a BUN of 19, creatinine of 1.11 with a GFR of 49 and a potassium of 3.2. White count was 10.5 with a  hemoglobin of 15.2.   Urinalysis was unremarkable.   Abdominal/pelvic CT revealed no acute abnormality.   ASSESSMENT: 1.  Abdominal pain.  2.  Nausea and vomiting.  3.  Dehydration.  4.  Hypokalemia.   PLAN: The patient will be observed in the hospital for presumed gastroenteritis. She will be placed on IV fluids with potassium supplementation. We will begin a clear liquid diet at this time and use morphine and Zofran as needed for pain and nausea. We will follow up routine abdominal films in the morning as well as routine labs. Further treatment and evaluation will depend upon the patient's progress.   TOTAL TIME SPENT ON THIS PATIENT: 45 minutes.    ____________________________ Duane Lope Judithann Sheen, MD jds:cc D: 03/27/2013 17:51:07 ET T: 03/27/2013 18:39:47 ET JOB#: [id number]  cc: Duane Lope. Judithann Sheen, MD, <Dictator> [facility] Bernard Donahoo Rodena Medin MD ELECTRONICALLY SIGNED 03/27/2013 21:44

## 2015-03-03 NOTE — H&P (Signed)
History of Present Illness [age] yo G2P2F who presented to outside clinic with LLQ pain and dysuria. The outside provider was concerned for diverticulitis, but also sent a UA. This revealed an infection, but it is unclear if this was treated or not. [facility]. She was diagnosed with UTI and admitted for hydration, antiemetics and antibiotics.   Over the course of admission Ms. Gielow developed vaginal pain and itching. She denies vaginal bleeding. She is s/p hysterectomy. CT did not reveal pelvic pathology.   Past History PMH: HTN, anxiety/depression, diverticulitis, C diff (per notes) PSH: Cholecystectomy, Back surgery x 2, knee surgery, hysterectomy   Past Med/Surgical Hx:  Diverticulitis:   cdiff:   partial hysterectomy:   Depression:   Hypertension:   gall bladder removed:   back surgery:   ALLERGIES:  Penicillin: Unknown  Lexapro: Other  HOME MEDICATIONS:  Medication Instructions Status  promethazine 12.5 mg oral tablet 1 tab(s) orally every 6 hours, As Needed - for Nausea, Vomiting Active  K-Tab 20 mEq oral tablet, extended release 1 tab twice a day for 3 days then 1 tab once a day for 3 days. Active  Cipro 250 mg tablet 1 tab(s) orally every 12 hours x 7 days Active  Flagyl 500 mg oral tablet 1 tab(s) orally every 8 hours Active  Florastor 250 mg oral capsule 2 cap(s) orally 2 times a day x 30 days Active  clonazepam 0.5 mg oral tablet 1 tab(s) orally 2 times a day, As Needed for anxiety Active  Lexapro 20 mg oral tablet 1 tab(s) orally once a day Active  Norvasc 5 mg oral tablet 1 tab(s) orally once a day Active  triamcinolone 0.1% topical cream Apply topically to affected area 3 times a day, As Needed for dryness Active  fluticasone 50 mcg/inh nasal spray 2 spray(s) nasal once a day Active   Family and Social History:  Family History Non-Contributory    Place of Living Home    Review of Systems:  Subjective/Chief Complaint  Vaginal itching/burning   Physical Exam:  ABD Mild suprapubic tenderness    GU superpubic tenderness  Vaginal cuff intact, no palpable masses. S/p hysterectomy    Radiology Results:  CT:    16-Feb-16 15:47, CT Abdomen and Pelvis With Contrast  CT Abdomen and Pelvis With Contrast  REASON FOR EXAM:    (1) abd pain , remote and questionable hx of   diverticulitis; (2) abd pain , remo  COMMENTS:       PROCEDURE: CT  - CT ABDOMEN / PELVIS  W  - Dec 18 2014  3:47PM     CLINICAL DATA:  Intermittent LEFT lower quadrant pain for 2 months,  history of diverticulitis, recent diagnosis UTI, pain increased over  past 24 hours, nausea, vomiting, diarrhea, hypertension    EXAM:  CT ABDOMEN AND PELVIS WITH CONTRAST    TECHNIQUE:  Multidetector CT imaging of the abdomen and pelvis was performed  using the standard protocol following bolus administration of  intravenous contrast. Sagittal and coronal MPR images reconstructed  from axial data set.    CONTRAST:  100 cc Omnipaque 300 IV.  Dilute oral contrast.    COMPARISON:  03/27/2013    FINDINGS:  Lung bases clear.    Gallbladder and uterus surgically absent.    Tiny suspected cyst 4 mm diameter RIGHT lobe liver image 29.  Liver, spleen, pancreas, kidneys, and adrenal glands normal.    Appendix not visualized.  Sigmoid diverticulosis without definite evidence of diverticulitis.    Stomach and bowel loops otherwise normal appearance.    Bladder, ureters and ovaries unremarkable.    Splenule adjacent to spleen.    No mass, adenopathy, free fluid, free air, or inflammatory process.    Tiny umbilical hernia containing fat.  Bones slightly demineralized without acute abnormalities.     IMPRESSION:  Sigmoid diverticulosis without definite evidence of acute  diverticulitis.    Tiny umbilical hernia.    No definite acute intra-abdominal or intrapelvic abnormalities.      Electronically Signed    By: Ulyses Southward M.D.     On: 12/18/2014 16:01     Verified By: Lollie Marrow, M.D.,    Assessment/Admission Diagnosis Vaginal candidiasis s/p antibiotic treatment for complicated UTI   Plan 1. Vaginal candidiasis: This is a common finding after antibiotic treatment. Wet prep revealed yeast which is consistent with history and physical. Would recommend treatment with flucanzole  PO x 1 and repeat in 48-72 hrs given her prolonged antibiotic exposure.   Electronic Signatures: Areta Haber (MD)  (Signed 19-Feb-16 12:27)  Authored: CHIEF COMPLAINT and HISTORY, PAST MEDICAL/SURGIAL HISTORY, ALLERGIES, HOME MEDICATIONS, FAMILY AND SOCIAL HISTORY, REVIEW OF SYSTEMS, PHYSICAL EXAM, Radiology, ASSESSMENT AND PLAN   Last Updated: 19-Feb-16 12:27 by Areta Haber (MD)

## 2015-03-03 NOTE — Discharge Summary (Signed)
PATIENT NAME:  Marilyn Andrews, Marilyn Andrews MR#:  [id number] DATE OF BIRTH:  [birth year]  DATE OF ADMISSION:  12/19/2014 DATE OF DISCHARGE:  12/25/2014  DISCHARGE DIAGNOSES:  1.  Clostridium difficile colitis.  2.  Intractable nausea and vomiting.  3.  Urinary tract infection.  4.  Yeast vaginitis.  5.  Depression.   MEDICATIONS ON DISCHARGE:  1.  Clonazepam 0.5 mg oral tablet 2 times a day as needed for anxiety.  2.  Norvasc 5 mg oral once a day.  3.  Fluticasone 2 sprays nasal once a day.  4.  Escitalopram 20 mg oral tablet once a day.  5.  Flagyl 500 mg oral every 8 hours for 9 days.  6.  Acetaminophen 325 mg oral take 2 tablets every 4 hours as needed for pain and fever.  7.  Percocet 5 mg tablets 4 times a day as needed for moderate pain.  8.  Ondansetron 4 mg oral tablet disintegrating 3 times a day as needed for nausea and vomiting.  9.  Vancomycin 250 mg oral every 6 hours for 9 days.  10.  Lactobacilli acidophilus 1 capsule 2 times a day.   DISCHARGE INSTRUCTIONS:  1.  Advised to have physical therapy nurse, nurse aide, and psychiatric nurse at home on discharge, which was arranged by case manager.  2.  Low sodium and regular consistency diet.  3.  Followup in GI clinic in 1 to 2 weeks by Dr. Lutricia FeilPaul Oh.   HISTORY OF PRESENTING ILLNESS: A 79 year old Caucasian female with a history of hypertension, depression, presenting with abdominal pain, nausea and vomiting, described intermittent abdominal pain for 2 months in total duration, left lower quadrant location, sharp in the quality, and somewhat between 6 and 7 out of 10 in intensity, worsening, associated with nausea and vomiting, nonbilious vomiting, and with decreased p.o. intake, so she came to the hospital for primarily this complaint. She was treated recently for UTI in outpatient clinic. Symptoms still persisted after being treated for that.   HOSPITAL COURSE AND STAY:  1.  C. difficile colitis: Initially on admission, the patient's  symptoms appeared to be resolving  and she was treated for cystitis with antibiotic as she was treated outpatient, but still the symptoms persisted. We are assuming it is mainly the episode of UTI, but the patient had some loose stool episodes also, which were not collected initially. After 3 days the sample was sent to the laboratory, which found to be having C. difficile. I strongly suspect this was present and was the main reason for her presentation on admission and we started treating her with Flagyl. After 2 days treatment with Flagyl her diarrhea still persisted, so GI suggested to add vancomycin to her treatment. The patient was also supposed to have a colonoscopy last year which she missed the appointment, so GI suggested to follow in the clinic after her C. difficile resolves within 1 to 2 weeks and they can schedule her as outpatient for that. Her nausea and diarrhea came under control and so we were able to discharge her with oral antibiotics to finish the course.  2.  Acute cystitis: She failed outpatient treatment. In the hospital continued Rocephin. Urine culture did not grow anything, so after 5 to 6 days of course we stopped Rocephin in the hospital.  3.  Intractable nausea: It resolved later on after treating C. difficile and we continued IV fluid for hydration initially, and the patient was able to tolerate diet.  4.  Vulvovaginal candidiasis: The patient had complaint of itching in her genitals. There was some whitish secretions. We called a GYN consult and they suggested Diflucan 150 mg 2 doses. That was given and the patient had improvement in the condition.  5.  Hypokalemia: We replaced the potassium and it stayed stable.  6.  Hypertension: We continued Norvasc.  7.  Depression: The patient had a flat affect and was very sad. Psychiatric consult was called in and they started her on Lexapro. We discharged with that.  8.  Generalized weakness: Physical therapy evaluation was done and she  was discharged with PT and nurse on discharge at home.   CONSULTANTS IN THE HOSPITAL: Teena Irani. Derrill Kay, MD for GYN, Ezzard Standing. Bluford Kaufmann, MD for GI, and Audery Amel, MD for psychiatric.  IMPORTANT LABORATORY RESULTS IN THE HOSPITAL: On presentation, troponin was negative. WBC 6.1, hemoglobin 14.6, and platelet count 316,000, MCV was 87. BUN was 6, creatinine 0.89, sodium 141, and potassium 2.8 on presentation, chloride 105, CO2 of 28, and calcium level of 8.4. Magnesium 1.8. Urinalysis was positive with 14 WBCs and 2+ leukocyte esterase. C. difficile which was February 20 reported to be positive for antigen and toxigenic C. difficile.   TOTAL TIME SPENT ON THIS DISCHARGE: 40 minutes.    ____________________________ Marilyn Andrews Marilyn Pigeon, MD vgv:ts D: 12/28/2014 09:25:00 ET T: 12/28/2014 22:24:38 ET JOB#: [id number]  cc: Marilyn Andrews. Marilyn Pigeon, MD, <Dictator> Ezzard Standing. Bluford Kaufmann, MD   Altamese Dilling MD ELECTRONICALLY SIGNED 01/14/2015 12:54

## 2015-03-03 NOTE — Consult Note (Signed)
Chief Complaint:  Subjective/Chief Complaint Feels worse today than yesterday. 3 BM;s so far.   VITAL SIGNS/ANCILLARY NOTES: **Vital Signs.:   22-Feb-16 08:12  Vital Signs Type Q 8hr  Temperature Temperature (F) 98.7  Celsius 37  Pulse Pulse 72  Respirations Respirations 18  Systolic BP Systolic BP 128  Diastolic BP (mmHg) Diastolic BP (mmHg) 62  Mean BP 84  Pulse Ox % Pulse Ox % 94  Pulse Ox Activity Level  At rest  Oxygen Delivery Room Air/ 21 %; Trach Aerosol Mask   Brief Assessment:  GEN no acute distress   Cardiac Regular   Respiratory clear BS   Gastrointestinal mild tenderness   Lab Results: Routine Hem:  21-Feb-16 04:39   Platelet Count (CBC) 243 (Result(s) reported on 23 Dec 2014 at 05:09AM.)   Assessment/Plan:  Assessment/Plan:  Assessment C.diff   Plan Can add vanco 250 qid to flagyl. Would prefer to wait for colonoscopy until later so infection can clear.   Electronic Signatures: Lutricia Feilh, Devone Bonilla (MD)  (Signed [phone]-Feb-16 16:03)  Authored: Chief Complaint, VITAL SIGNS/ANCILLARY NOTES, Brief Assessment, Lab Results, Assessment/Plan   Last Updated: 22-Feb-16 16:03 by Lutricia Feilh, Lyall Faciane (MD)

## 2015-03-03 NOTE — H&P (Signed)
PATIENT NAME:  Marilyn Andrews, RAHILLY MR#:  [id number] DATE OF BIRTH:  July 23, 1936  DATE OF ADMISSION:  12/18/2014  REFERRING PHYSICIAN:  Darien Ramus, MD  PRIMARY CARE PHYSICIAN:  Lucillie Garfinkel. Mayford Knife, MD  CHIEF COMPLAINT:  Abdominal pain.   HISTORY OF PRESENT ILLNESS:  This is a 79 year old Caucasian female with a history of hypertension and depression, presenting with abdominal pain, nausea, and vomiting. She describes intermittent abdominal pain for 2 months in total duration, left lower quadrant in location, sharp in quality, somewhere between 6 and 7 out of 10 in intensity, worsening recently with associated nausea and vomiting with nonbloody, nonbilious emesis and decreased p.o. intake. She denies any diarrhea, constipation, fevers, or chills; however, positive for dysuria as well as increased urinary frequency. She was recently treated with antibiotics for a UTI. Despite this, symptoms remain. She is uncertain of which antibiotic she actually received.   REVIEW OF SYSTEMS: CONSTITUTIONAL:  Denies fevers or chills. Positive for fatigue and weakness.  EYES:  Denies blurred vision, double vision, or eye pain.  EARS, NOSE, AND THROAT:  Denies tinnitus, ear pain, or hearing loss.  RESPIRATORY:  Denies cough or shortness of breath. CARDIOVASCULAR:  Denies chest pain or palpitations.  GASTROINTESTINAL:  Positive for nausea, vomiting, and abdominal pain as described above.  GENITOURINARY:  Positive for dysuria as well as increased urinary frequency.  ENDOCRINE:  Denies nocturia or thyroid problems.  HEMATOLOGIC AND LYMPHATIC:  Denies easy bruising or bleeding. SKIN:  Denies rash or lesions.  MUSCULOSKELETAL:  Denies pain in the neck, back, shoulders, knees, or hips or arthritic symptoms.  NEUROLOGIC:  Denies paralysis or paresthesias.  PSYCHIATRIC:  Denies anxiety or depressive symptoms.  Otherwise, full review of systems performed by me is negative.   PAST MEDICAL HISTORY:  Includes essential  hypertension and history of diverticulitis as well as depression not otherwise specified.   SOCIAL HISTORY:  No alcohol, tobacco, or drug usage.   FAMILY HISTORY:  Positive for coronary artery disease, as well as hypertension.   ALLERGIES:  LEXAPRO, PENICILLIN.   HOME MEDICATIONS:  Include clonazepam 0.5 mg p.o. b.i.d. as needed for anxiety, Lexapro 20 mg p.o. daily, and Norvasc 5 mg p.o. daily.   PHYSICAL EXAMINATION: VITAL SIGNS:  Temperature is 97.9, heart rate 66, respirations 20, blood pressure 127/60, and saturating 95% on room air. Weight 78.5 kg, BMI 33.8.  GENERAL:  Weak-appearing Caucasian female currently in no acute distress.  HEAD:  Normocephalic, atraumatic.  EYES:  Pupils are equal, round, and reactive to light. Extraocular muscles are intact. No scleral icterus.  MOUTH:  Moist mucosal membrane. Dentition is intact. No abscess noted.  EARS, NOSE, AND THROAT:  Clear without exudates. No external lesions.  NECK:  Supple. No thyromegaly. No nodules. No JVD.  PULMONARY:  Clear to auscultation bilaterally without wheezes, rales, or rhonchi. No use of accessory muscles. Good respiratory effort.  CHEST:  Nontender to palpation.  CARDIOVASCULAR:  S1 and S2, regular rate and rhythm. No murmurs, rubs, or gallops. No edema. Pedal pulses are 2+ bilaterally. GASTROINTESTINAL:  Soft. Minimal tenderness to palpation periumbilically as well as left lower quadrant without rebound, guarding, or motion tenderness. No distention. Positive bowel sounds. No hepatosplenomegaly.  MUSCULOSKELETAL:  No swelling, clubbing, or edema. Range of motion is full in all extremities.  NEUROLOGIC:  Cranial nerves II through XII are intact. No gross focal neurological deficits. Sensation is intact. Reflexes are intact.  SKIN:  No ulceration, lesion, rashes, or cyanosis. Skin is warm  and dry. Turgor is intact.  PSYCHIATRIC:  Mood and affect are blunted. She is awake, alert, and oriented x 3. Insight and judgment  are intact.   LABORATORY DATA:  Sodium is 141, potassium 2.8, chloride 105, bicarbonate 28, BUN 6, creatinine 0.89, glucose 87, and albumin is 3.1; otherwise, LFTs are within normal limits. WBC is 6.1, hemoglobin 14.6, and platelets are 316,000.   Urinalysis:  WBC 14, RBC less than 1, leukocyte esterase 2+, epithelial cells 2, bacteria 3+.   CT of the abdomen and pelvis performed, which reveals no acute intra-abdominal processes.   ASSESSMENT AND PLAN:  This is a 79 year old Caucasian female with a history of essential hypertension as well as depression not otherwise specified, presenting with abdominal pain and nausea.   1.  Intractable nausea. Continue with p.r.n. medications and intravenous fluid hydration.  2.  Urinary tract infection, failed outpatient treatment. We will dose with ceftriaxone. Uncertain of what she actually received as an outpatient. We will follow urine culture.  3.  Hypokalemia. Replace potassium with goal of 4 to 5. We will check a magnesium level.  4.  Hypertension, essential. Continue with home dosage of Norvasc.  5.  Venous thromboembolism prophylaxis with heparin subcutaneously.   CODE STATUS:  The patient is a full code.   TIME SPENT:  45 minutes.    ____________________________ Cletis Athensavid K. Hower, MD dkh:nb D: 12/18/2014 21:51:47 ET T: 12/18/2014 22:34:56 ET JOB#: [id number]  cc: Cletis Athensavid K. Hower, MD, <Dictator> DAVID Synetta ShadowK HOWER MD ELECTRONICALLY SIGNED 12/19/2014 20:40

## 2015-03-03 NOTE — Consult Note (Signed)
PATIENT NAME:  Marilyn Andrews, [name] MR#:  [id number] DATE OF BIRTH:  February 21, 1936  DATE OF CONSULTATION:  12/23/2014  REASON FOR REFERRAL: Clostridium difficile infection.   DESCRIPTION: The patient is a 79 year old white female, who was admitted with cystitis, vaginal candidiasis and intractable nausea. The nausea is finally better. However, the patient was found to have positive C. diff. The patient was just placed on Flagyl yesterday, which is 12/22/2014. The patient had been complaining of left lower quadrant abdominal pain on and off for the past 3 months or so. She does have a history of colon polyps. The last colonoscopy was back in 2007 when the polyps were removed. She had a bout of C. diff. in 2013 after antibiotic use. She was supposed to have a routine colonoscopy in 2012, but she never followed through. The nausea, again, it has improved, but she still has this persistent abdominal pain and some diarrhea.   PAST MEDICAL HISTORY: Notable for diverticulitis and C. difficile and anxiety, depression.   PAST SURGICAL HISTORY: Includes back surgery, cholecystectomy hysterectomy.   HOME MEDICATIONS: Include Phenergan, potassium, Cipro and Flagyl and clonazepam, Lexapro, Norvasc.   FAMILY HISTORY: Noncontributory   PHYSICAL EXAMINATION:  GENERAL: The patient is in no acute distress right now.  VITAL SIGNS: Stable. She is afebrile.  HEAD AND NECK: Within normal limits.  CARDIOVASCULAR: Regular rhythm and rate.  LUNGS: Clear bilaterally.  ABDOMEN: Normoactive bowel sounds, soft. There is mainly tenderness in the left lower quadrant abdominal area. There is no rebound or guarding. There is no hepatomegaly.  EXTREMITIES: No clubbing, cyanosis, or edema.   LABORATORY, DIAGNOSTIC AND RADIOLOGICAL DATA: On 12/22/2014, electrolytes normal, except for chloride 108, BUN was 4, liver enzymes were normal on admission, hemoglobin was 12.8. White count 7.4 and platelet count was 262 on admission on 12/19/2014.  Again, stool is positive for C. difficile.  This is her second bout of C. difficile. It is unclear whether the C. difficile is what is causing the pain, although the patient has had this pain for the last 3 months or so. She also has a known history of colon polyps in the past. I recommend that we check this C. difficile infection first. If the pain persists, she certainly needs to have a repeat colonoscopy done. That can be done while she is in the hospital, if the infection is prolonged. Otherwise, we can arrange the colonoscopy as an outpatient later after the infection is cleared.   Thank you for the referral.  ____________________________ Ezzard StandingPaul Y. Bluford Kaufmannh, MD pyo:ap D: 12/26/2014 11:31:46 ET T: 12/26/2014 11:52:47 ET JOB#: [id number]  cc: Ezzard StandingPaul Y. Bluford Kaufmannh, MD, <Dictator> Ezzard StandingPAUL Y Takeo Harts MD ELECTRONICALLY SIGNED 12/26/2014 13:26

## 2015-03-03 NOTE — Consult Note (Signed)
PATIENT NAME:  Marilyn, Andrews MR#:  [id number] DATE OF BIRTH:  18-Nov-1935  DATE OF CONSULTATION:  12/21/2014  REFERRING PHYSICIAN:   CONSULTING PHYSICIAN:  Audery Amel, MD  IDENTIFYING INFORMATION AND CHIEF COMPLAINT: A 79 year old woman with a longstanding history of depression and anxiety who has been admitted to the hospital for evaluation of abdominal pain and vomiting. Consult is for anxiety and depression.   HISTORY OF PRESENT ILLNESS: Information obtained from the patient and the chart. The patient came into the hospital because of ongoing complaints of lower abdomen pain as well as persistent vomiting. The patient reports to me that she has had chronic depression and anxiety, but for the last month or so it has been getting worse. She feels nervous and worried all the time. Her main focus of her anxiety is that her outpatient prescriber has informed her that she will no longer prescribe clonazepam. Apparently the patient switched to a new physician's assistant recently who is cutting her off from her clonazepam for what the patient perceives as being no particular reason. The patient has been able to continue scraping together some clonazepam so far, but is very worried about what will happen when she runs out. Her mood stays nervous and mildly depressed much of the time. Her sleep habits are erratic. She denies any suicidal ideation. Denies homicidal ideation. Denies hallucinations. She says she has occasional panic attacks of about once-a-week frequency. Also worries chronically about her own health as well as the health of her sister.   PAST PSYCHIATRIC HISTORY: The patient has been treated with medication for years. She had been on several serotonin reuptake inhibitors. She also has been on clonazepam 0.5 mg twice a day for many years at a stable rate. She has been on her current Lexapro for quite a while. This combination looks like it had been working as well as anything for her. No clear  past history of psychiatric hospitalization. No history of suicide attempts.   FAMILY HISTORY: Positive for anxiety.   SOCIAL HISTORY: The patient lives with her sister. She does have adult children and grandchildren with whom she stays in close contact.   PAST MEDICAL HISTORY: The patient has a history of recurrent abdominal symptoms including pain and nausea. Also has a history of high blood pressure.   SUBSTANCE ABUSE HISTORY: Denies any history of alcohol or drug abuse. No evidence that she has had a real history of abusing any of her prescriptions.   REVIEW OF SYSTEMS: Mood is stated as anxious. Denies suicidal ideation. Denies hallucinations. Having abdominal pain, mild nausea, but no acute vomiting right now.   MENTAL STATUS EXAMINATION: Neatly groomed woman, looks her stated age, interviewed in a hospital bed. Makes good eye contact. Psychomotor activity normal given her circumstances. Speech normal rate, tone, and volume. Affect is anxious. She has a slightly paranoid tone about her. She refused to speak to me while the nurse was in the room, which seemed a little unusual. Denies, however, any hallucinations. No evidence of actual psychotic thinking. No suicidal or homicidal ideation. She could repeat 3 words immediately, 2 out of 3 at 3 minutes. Alert and oriented x 4. Intelligence normal.   LABORATORY RESULTS: Today, chemistry panel low BUN at 4, low calcium 7.9. Most recent CBC all normal.   VITAL SIGNS: Blood pressure 149/81, respirations 18, pulse 65, temperature 98.7.   ASSESSMENT: A 79 year old woman with a history of chronic anxiety and depression. Mostly she is anxious about the possibility  of having her clonazepam discontinued. I note that she is actually being prescribed the clonazepam at her usual dose here in the hospital. She is also getting her Lexapro at the usual dose. I do not see any indication right now to change any of that.   TREATMENT PLAN: Continue Lexapro 20 mg a  day and clonazepam 0.5 twice a day. I did some psychoeducation and informed the patient that if she was dissatisfied with her outpatient provider, she could look into finding a different one. I told her I could not promise what her current provider would do after discharge, but it seemed reasonable to me to continue her clonazepam at the current dose for now. I will follow up after the weekend if she is still in the hospital.   DIAGNOSIS, PRINCIPAL AND PRIMARY:  AXIS I: Depression, not otherwise specified.   SECONDARY DIAGNOSIS: Generalized anxiety disorder.  AXIS II: Deferred.  AXIS III: Abdominal pain of unclear etiology    ____________________________ Audery AmelJohn T. Clapacs, MD jtc:ST D: 12/21/2014 23:45:18 ET T: 12/22/2014 00:03:33 ET JOB#: [id number]  cc: Audery AmelJohn T. Clapacs, MD, <Dictator> Audery AmelJOHN T CLAPACS MD ELECTRONICALLY SIGNED 01/08/2015 10:35

## 2015-03-03 NOTE — Consult Note (Signed)
Pt seen and examined. Admitted with cystitis, vaginal candidiasis, intractable nausea. Nausea finally better. However, now has positive c.diff. On flagyl since yesterday. C/O LLQ abdominal pain on and off x 3 months. Hx of colon polyps. Last colon in 2007 when polyp removed. Had a bout of c.diff in 2013. Was supposed to repeat colon by 2012 but never followed through. Pt does need to have repeat colonoscopy repeated. However, would like to treat c.diff 1st for at least few days before considering colonscopy. Will follow. Thanks.  Electronic Signatures: Lutricia Feilh, Remas Sobel (MD)  (Signed on 21-Feb-16 11:02)  Authored  Last Updated: 21-Feb-16 11:02 by Lutricia Feilh, Nekayla Heider (MD)

## 2015-03-18 ENCOUNTER — Encounter: Payer: Self-pay | Admitting: Certified Registered Nurse Anesthetist

## 2015-03-18 ENCOUNTER — Encounter: Admission: RE | Payer: Self-pay | Source: Ambulatory Visit

## 2015-03-18 ENCOUNTER — Ambulatory Visit: Admission: RE | Admit: 2015-03-18 | Payer: Medicare Other | Source: Ambulatory Visit | Admitting: Gastroenterology

## 2015-03-18 SURGERY — COLONOSCOPY WITH PROPOFOL
Anesthesia: Monitor Anesthesia Care

## 2016-02-28 ENCOUNTER — Emergency Department: Payer: Medicare Other

## 2016-02-28 ENCOUNTER — Emergency Department
Admission: EM | Admit: 2016-02-28 | Discharge: 2016-02-28 | Disposition: A | Discharge status: Home / Self Care | Payer: Medicare Other | Attending: Emergency Medicine | Admitting: Emergency Medicine

## 2016-02-28 ENCOUNTER — Encounter: Payer: Self-pay | Admitting: *Deleted

## 2016-02-28 DIAGNOSIS — N390 Urinary tract infection, site not specified: Secondary | ICD-10-CM | POA: Insufficient documentation

## 2016-02-28 DIAGNOSIS — R0602 Shortness of breath: Secondary | ICD-10-CM | POA: Insufficient documentation

## 2016-02-28 DIAGNOSIS — R109 Unspecified abdominal pain: Secondary | ICD-10-CM | POA: Diagnosis present

## 2016-02-28 DIAGNOSIS — R0981 Nasal congestion: Secondary | ICD-10-CM | POA: Diagnosis not present

## 2016-02-28 DIAGNOSIS — Z79899 Other long term (current) drug therapy: Secondary | ICD-10-CM | POA: Insufficient documentation

## 2016-02-28 DIAGNOSIS — I10 Essential (primary) hypertension: Secondary | ICD-10-CM | POA: Insufficient documentation

## 2016-02-28 HISTORY — DX: Essential (primary) hypertension: I10

## 2016-02-28 LAB — COMPREHENSIVE METABOLIC PANEL
ALT: 12 U/L — ABNORMAL LOW (ref 14–54)
AST: 20 U/L (ref 15–41)
Albumin: 3.7 g/dL (ref 3.5–5.0)
Alkaline Phosphatase: 67 U/L (ref 38–126)
Anion gap: 10 (ref 5–15)
BUN: 12 mg/dL (ref 6–20)
CO2: 23 mmol/L (ref 22–32)
Calcium: 8.8 mg/dL — ABNORMAL LOW (ref 8.9–10.3)
Chloride: 107 mmol/L (ref 101–111)
Creatinine, Ser: 0.86 mg/dL (ref 0.44–1.00)
GFR calc Af Amer: >60 mL/min (ref >60)
GFR calc non Af Amer: >60 mL/min (ref >60)
Glucose, Bld: 102 mg/dL — ABNORMAL HIGH (ref 65–99)
Potassium: 3 mmol/L — ABNORMAL LOW (ref 3.5–5.1)
Sodium: 140 mmol/L (ref 135–145)
Total Bilirubin: 0.6 mg/dL (ref 0.3–1.2)
Total Protein: 6.9 g/dL (ref 6.5–8.1)

## 2016-02-28 LAB — URINALYSIS COMPLETE WITH MICROSCOPIC (ARMC ONLY)
Bilirubin Urine: NEGATIVE
Glucose, UA: NEGATIVE mg/dL
Hgb urine dipstick: NEGATIVE
Ketones, ur: NEGATIVE mg/dL
Nitrite: NEGATIVE
Protein, ur: NEGATIVE mg/dL
Specific Gravity, Urine: 1.014 (ref 1.005–1.030)
pH: 5 (ref 5.0–8.0)

## 2016-02-28 LAB — CBC
HCT: 40.3 % (ref 35.0–47.0)
Hemoglobin: 13.8 g/dL (ref 12.0–16.0)
MCH: 30 pg (ref 26.0–34.0)
MCHC: 34.3 g/dL (ref 32.0–36.0)
MCV: 87.6 fL (ref 80.0–100.0)
Platelets: 319 10*3/uL (ref 150–440)
RBC: 4.6 MIL/uL (ref 3.80–5.20)
RDW: 13.7 % (ref 11.5–14.5)
WBC: 7.1 10*3/uL (ref 3.6–11.0)

## 2016-02-28 LAB — TROPONIN I: Troponin I: <0.03 ng/mL (ref <0.031)

## 2016-02-28 LAB — LIPASE, BLOOD: Lipase: 26 U/L (ref 11–51)

## 2016-02-28 MED ORDER — ONDANSETRON 4 MG PO TBDP: 4.0000 mg | ORAL_TABLET | Freq: Four times a day (QID) | ORAL | Status: DC | PRN

## 2016-02-28 MED ORDER — CEFTRIAXONE SODIUM 1 G IJ SOLR
1.0000 g | Freq: Once | INTRAMUSCULAR | Status: AC
  Administered 2016-02-28: 1 g via INTRAVENOUS
  Filled 2016-02-28: qty 10

## 2016-02-28 MED ORDER — MORPHINE SULFATE (PF) 4 MG/ML IV SOLN
4.0000 mg | Freq: Once | INTRAVENOUS | Status: AC
  Administered 2016-02-28: 4 mg via INTRAVENOUS
  Filled 2016-02-28: qty 1

## 2016-02-28 MED ORDER — POTASSIUM CHLORIDE CRYS ER 20 MEQ PO TBCR
40.0000 meq | EXTENDED_RELEASE_TABLET | Freq: Once | ORAL | Status: AC
  Administered 2016-02-28: 40 meq via ORAL
  Filled 2016-02-28: qty 2

## 2016-02-28 MED ORDER — DIATRIZOATE MEGLUMINE & SODIUM 66-10 % PO SOLN
15.0000 mL | Freq: Once | ORAL | Status: AC
  Administered 2016-02-28: 15 mL via ORAL
  Filled 2016-02-28: qty 30

## 2016-02-28 MED ORDER — CEPHALEXIN 500 MG PO CAPS: 500.0000 mg | ORAL_CAPSULE | Freq: Four times a day (QID) | ORAL | Status: DC

## 2016-02-28 MED ORDER — DIPHENHYDRAMINE HCL 50 MG/ML IJ SOLN
INTRAMUSCULAR | Status: AC
  Filled 2016-02-28: qty 1

## 2016-02-28 MED ORDER — ONDANSETRON HCL 4 MG/2ML IJ SOLN
4.0000 mg | INTRAMUSCULAR | Status: AC
  Administered 2016-02-28: 4 mg via INTRAVENOUS
  Filled 2016-02-28: qty 2

## 2016-02-28 MED ORDER — DIPHENHYDRAMINE HCL 50 MG/ML IJ SOLN
25.0000 mg | Freq: Once | INTRAMUSCULAR | Status: AC
  Administered 2016-02-28: 25 mg via INTRAVENOUS

## 2016-02-28 MED ORDER — IOPAMIDOL (ISOVUE-300) INJECTION 61%
100.0000 mL | Freq: Once | INTRAVENOUS | Status: AC | PRN
  Administered 2016-02-28: 100 mL via INTRAVENOUS

## 2016-02-28 NOTE — Discharge Instructions (Signed)
You have been seen in the Emergency Department (ED) today for pain when urinating.  Your workup today suggests that you have a urinary tract infection (UTI). He also believes that the slight feeling of nasal congestion and having may be related to some seasonal-type allergies, however recommend close follow-up with her primary care doctor for both issues. Is very important to return to the emergency room if you develop trouble breathing, chest pain, have increasing pain in the lower abdomen or weakness, or other new concerns arise.  Please take your antibiotic as prescribed and over-the-counter pain medication (Tylenol or Motrin) as needed, but no more than recommended on the label instructions.  Drink PLENTY of fluids.  Call your regular doctor to schedule the next available appointment to follow up on todays ED visit, or return immediately to the ED if your pain worsens, you have decreased urine production, develop fever, persistent vomiting, or other symptoms that concern you.   Urinary Tract Infection Urinary tract infections (UTIs) can develop anywhere along your urinary tract. Your urinary tract is your body's drainage system for removing wastes and extra water. Your urinary tract includes two kidneys, two ureters, a bladder, and a urethra. Your kidneys are a pair of bean-shaped organs. Each kidney is about the size of your fist. They are located below your ribs, one on each side of your spine. CAUSES Infections are caused by microbes, which are microscopic organisms, including fungi, viruses, and bacteria. These organisms are so small that they can only be seen through a microscope. Bacteria are the microbes that most commonly cause UTIs. SYMPTOMS  Symptoms of UTIs may vary by age and gender of the patient and by the location of the infection. Symptoms in young women typically include a frequent and intense urge to urinate and a painful, burning feeling in the bladder or urethra during urination.  Older women and men are more likely to be tired, shaky, and weak and have muscle aches and abdominal pain. A fever may mean the infection is in your kidneys. Other symptoms of a kidney infection include pain in your back or sides below the ribs, nausea, and vomiting. DIAGNOSIS To diagnose a UTI, your caregiver will ask you about your symptoms. Your caregiver will also ask you to provide a urine sample. The urine sample will be tested for bacteria and white blood cells. White blood cells are made by your body to help fight infection. TREATMENT  Typically, UTIs can be treated with medication. Because most UTIs are caused by a bacterial infection, they usually can be treated with the use of antibiotics. The choice of antibiotic and length of treatment depend on your symptoms and the type of bacteria causing your infection. HOME CARE INSTRUCTIONS  If you were prescribed antibiotics, take them exactly as your caregiver instructs you. Finish the medication even if you feel better after you have only taken some of the medication.  Drink enough water and fluids to keep your urine clear or pale yellow.  Avoid caffeine, tea, and carbonated beverages. They tend to irritate your bladder.  Empty your bladder often. Avoid holding urine for long periods of time.  Empty your bladder before and after sexual intercourse.  After a bowel movement, women should cleanse from front to back. Use each tissue only once. SEEK MEDICAL CARE IF:   You have back pain.  You develop a fever.  Your symptoms do not begin to resolve within 3 days. SEEK IMMEDIATE MEDICAL CARE IF:   You have severe  back pain or lower abdominal pain.  You develop chills.  You have nausea or vomiting.  You have continued burning or discomfort with urination. MAKE SURE YOU:   Understand these instructions.  Will watch your condition.  Will get help right away if you are not doing well or get worse.   This information is not  intended to replace advice given to you by your health care provider. Make sure you discuss any questions you have with your health care provider.   Document Released: 07/29/2005 Document Revised: 07/10/2015 Document Reviewed: 11/27/2011 [employer] Interactive Patient Education [employer]

## 2016-02-28 NOTE — ED Provider Notes (Signed)
[facility] Emergency Department Provider Note  ____________________________________________  Time seen: Approximately 5:55 PM  I have reviewed the triage vital signs and the nursing notes.   HISTORY  Chief Complaint Shortness of Breath    HPI Marilyn Andrews is a 80 y.o. female previous history of C. difficile colitis, treated with oral vancomycin.  The patient notes that she's had increasing discomfort over the left side of the abdomen, some occasional crampy left-sided abdominal pain and increased loose stools over the last month. She reports his symptoms just gradually seem to be worsening. She denies any fevers or chills. She feels nauseated almost every day for the last 6 months, reports that this is not new but she has mild ongoing nausea today.  She also reports that she is having a feeling of some mild congestion. She describes as feeling like her sinuses are flaring up, and she is occasionally feeling like it's hard to breathe through her nose. She denies any wheezing, shortness of breath, nausea or vomiting to me. She describes instead rather more of a nasal congestion the makes her feel little short of breath at night, but she denies actually feeling like she cannot breathe or that she is actually short of breath.   Past Medical History  Diagnosis Date  . Hypertension     There are no active problems to display for this patient.   History reviewed. No pertinent past surgical history.  Current Outpatient Rx  Name  Route  Sig  Dispense  Refill  . acetaminophen (TYLENOL) 325 MG tablet   Oral   Take 650 mg by mouth every 6 (six) hours as needed.         Marland Kitchen. amLODipine (NORVASC) 5 MG tablet   Oral   Take 5 mg by mouth daily.         . cephALEXin (KEFLEX) 500 MG capsule   Oral   Take 1 capsule (500 mg total) by mouth 4 (four) times daily.   40 capsule   0   . clonazePAM (KLONOPIN) 0.5 MG tablet   Oral   Take 0.5 mg by mouth daily.          Marland Kitchen. escitalopram (LEXAPRO) 20 MG tablet   Oral   Take 20 mg by mouth daily.         Marland Kitchen. ibuprofen (ADVIL,MOTRIN) 600 MG tablet   Oral   Take 600 mg by mouth every 8 (eight) hours as needed for moderate pain.         Marland Kitchen. ondansetron (ZOFRAN ODT) 4 MG disintegrating tablet   Oral   Take 1 tablet (4 mg total) by mouth every 6 (six) hours as needed for nausea or vomiting.   20 tablet   0   . ondansetron (ZOFRAN) 4 MG tablet   Oral   Take 4 mg by mouth every 8 (eight) hours as needed for nausea or vomiting.         . ranitidine (ZANTAC) 150 MG tablet   Oral   Take 150 mg by mouth daily.         Marland Kitchen. saccharomyces boulardii (FLORASTOR) 250 MG capsule   Oral   Take 250 mg by mouth 2 (two) times daily.         Marland Kitchen. triamcinolone cream (KENALOG) 0.1 %   Topical   Apply 1 application topically daily as needed (rash on hands).           Allergies Penicillins  History reviewed. No pertinent  family history.  Social History Social History  Substance Use Topics  . Smoking status: Never Smoker   . Smokeless tobacco: None  . Alcohol Use: None    Review of Systems Constitutional: No fever/chills Eyes: No visual changes. ENT: No sore throat.She does report that occasionally her throat feels slightly scratchy, that she's had a slight runny nose and nasal congestion. Cardiovascular: Denies chest pain. Respiratory: Denies shortness of breath. Gastrointestinal: no vomiting.  No constipation Genitourinary: Negative for dysuria. Musculoskeletal: Negative for back pain. Skin: Negative for rash. Neurological: Negative for headaches, focal weakness or numbness.  10-point ROS otherwise negative.  ____________________________________________   PHYSICAL EXAM:  VITAL SIGNS: ED Triage Vitals  Enc Vitals Group     BP 02/28/16 1638 131/71 mmHg     Pulse Rate 02/28/16 1638 68     Resp 02/28/16 1638 25     Temp 02/28/16 1638 98.3 F (36.8 C)     Temp Source 02/28/16 1638 Oral      SpO2 02/28/16 1638 94 %     Weight 02/28/16 1638 190 lb (86.183 kg)     Height 02/28/16 1638 5' (1.524 m)     Head Cir --      Peak Flow --      Pain Score 02/28/16 1644 9     Pain Loc --      Pain Edu? --      Excl. in GC? --    Constitutional: Alert and oriented. Well appearing and in no acute distress. Eyes: Conjunctivae are normal. PERRL. EOMI. Head: Atraumatic. Nose: Minimal clear nasal congestion with slight clear curry's. Mouth/Throat: Mucous membranes are moist.  Oropharynx non-erythematous. Neck: No stridor.   Cardiovascular: Normal rate, regular rhythm. Grossly normal heart sounds.  Good peripheral circulation. Respiratory: Normal respiratory effort.  No retractions. Lungs CTAB. Speaks in full and normal sentences. Oxygen saturation noted 97% on room air. Gastrointestinal: Soft and nontender except for some mild discomfort primarily suprapubically without rebound or guarding. No distention. No abdominal bruits. No CVA tenderness. Musculoskeletal: No lower extremity tenderness nor edema.  No joint effusions. Neurologic:  Normal speech and language. No gross focal neurologic deficits are appreciated. Skin:  Skin is warm, dry and intact. No rash noted. Psychiatric: Mood and affect are normal. Speech and behavior are normal.  ____________________________________________   LABS (all labs ordered are listed, but only abnormal results are displayed)  Labs Reviewed  COMPREHENSIVE METABOLIC PANEL - Abnormal; Notable for the following:    Potassium 3.0 (*)    Glucose, Bld 102 (*)    Calcium 8.8 (*)    ALT 12 (*)    All other components within normal limits  URINALYSIS COMPLETEWITH MICROSCOPIC ([facility] ONLY) - Abnormal; Notable for the following:    Color, Urine YELLOW (*)    APPearance HAZY (*)    Leukocytes, UA 3+ (*)    Bacteria, UA MANY (*)    Squamous Epithelial / LPF 6-30 (*)    All other components within normal limits  C DIFFICILE QUICK SCREEN W PCR REFLEX   GASTROINTESTINAL PANEL BY PCR, STOOL (REPLACES STOOL CULTURE)  LIPASE, BLOOD  CBC  TROPONIN I   ____________________________________________  EKG  Reviewed and are only at 1635 Ventricular rate 70 PR 160 QRS 75 QTC 420 Reviewed and interpreted as normal sinus rhythm, nonspecific flattening and minimal inversions noted in 1 and aVL, no evidence of ST elevation MI or an obvious ischemic change. In reviewing her previous EKG from February 2016, the patient  had fairly flat-appearing T waves in similar leads. A suspect no significant change noted. ____________________________________________  RADIOLOGY   CT Abdomen Pelvis W Contrast (Final result) Result time: 02/28/16 20:47:14   Final result by Rad Results In Interface (02/28/16 20:47:14)   Narrative:   CLINICAL DATA: Right flank pain, nausea, vomiting, diarrhea.  EXAM: CT ABDOMEN AND PELVIS WITH CONTRAST  TECHNIQUE: Multidetector CT imaging of the abdomen and pelvis was performed using the standard protocol following bolus administration of intravenous contrast.  CONTRAST: ISOVUE-300 IOPAMIDOL (ISOVUE-300) INJECTION 61%  COMPARISON: CT scan of December 18, 2014.  FINDINGS: Multilevel degenerative disc disease is noted in the lumbar spine. Visualized lung bases appear intact.  Status post cholecystectomy. Stable hepatic cysts are noted. Mild intrahepatic and extrahepatic biliary dilatation is stable consistent with post cholecystectomy status. The spleen and pancreas appear normal. Adrenal glands and kidneys appear normal. No hydronephrosis or renal obstruction is noted. No renal or ureteral calculi are noted. There is no evidence of bowel obstruction. No abnormal fluid collection is noted. Atherosclerosis of abdominal aorta is noted without aneurysm formation. Status post hysterectomy. Ovaries are unremarkable. Urinary bladder appears normal. Sigmoid diverticulosis is noted without inflammation. No  significant adenopathy is noted.  IMPRESSION: Sigmoid diverticulosis is noted without inflammation.  Atherosclerosis of abdominal aorta is noted without aneurysm formation.  No acute abnormality seen in the abdomen or pelvis.   Electronically Signed By: Lupita Raider, M.D. On: 02/28/2016 20:47          DG Chest 2 View (Final result) Result time: 02/28/16 17:27:17   Final result by Rad Results In Interface (02/28/16 17:27:17)   Narrative:   CLINICAL DATA: 80 year old female with worsening shortness of breath and lower abdominal pain which radiates into the pelvis and both legs. Intermittent nausea and diarrhea for the past 6 months.  EXAM: CHEST 2 VIEW  COMPARISON: Chest x-ray 07/14/2006.  FINDINGS: Lung volumes are normal. No consolidative airspace disease. No pleural effusions. No pneumothorax. No pulmonary nodule or mass noted. Pulmonary vasculature and the cardiomediastinal silhouette are within normal limits. Atherosclerosis in the thoracic aorta.  IMPRESSION: 1. No radiographic evidence of acute cardiopulmonary disease. 2. Atherosclerosis.   Electronically Signed By: Trudie Reed M.D. On: 02/28/2016 17:27    ____________________________________________   PROCEDURES  Procedure(s) performed: None  Critical Care performed: No  ____________________________________________   INITIAL IMPRESSION / ASSESSMENT AND PLAN / ED COURSE  Pertinent labs & imaging results that were available during my care of the patient were reviewed by me and considered in my medical decision making (see chart for details).  Patient presents for evaluation of abdominal discomfort, cramping and loose stools. She reports lower and left-sided abdominal discomfort. Overall her clinical examination is reassuring and only demonstrate some mild suprapubic tenderness at this time. She denies fevers chills chest pain or trouble breathing. She does report however  having some nasal congestion, and she does demonstrate some mild clear rhinorrhea which she's been having for several days. Her oxygen saturation is normal on room air, and she speaking in full normal sentences.  No acute cardiac or pulmonary symptoms. EKG reassuring and normal troponin. Her chest x-ray demonstrates no acute abnormality. She denies any pleuritic pain, unilateral edema, and she really does not have any significant risk factors for pulmonary embolism is not complaining of any chest pain or other symptoms.  ----------------------------------------- 9:21 PM on 02/28/2016 -----------------------------------------  Would like to be able to check a C. difficile test, but the patient has not stooled in  it's been several hours in the ER. I will for refer back to primary care doctor for further evaluation. Does appear she may have a urinary tract infection which likely would explain some of the crampy lower abdominal discomfort.  She is awake alert, amicable and in no distress this time. Discharged with prescription for cephalexin and close follow-up. Careful return precautions advised. ____________________________________________   FINAL CLINICAL IMPRESSION(S) / ED DIAGNOSES  Final diagnoses:  Lower urinary tract infection, acute  Nasal congestion      Sharyn Creamer, MD 02/28/16 2121

## 2016-02-28 NOTE — ED Notes (Addendum)
States SOB and lower abd pain and groin pain, states this has been going on 6 months but worsening SOB, pt awake and alert, speaking in full sentances

## 2016-02-28 NOTE — ED Notes (Signed)
Pt reports n/v/d x 6 months.  States it seems worse x 3 wks.  Also reports pain in right flank are and bilat legs and sob.  No significant edema noted.  No labored breathing or resp distress. Skin w/d with good color.  2L O2 applied for comfort.

## 2016-02-28 NOTE — ED Notes (Signed)
Pt also states vomiting and diarrhea for 6 months

## 2016-06-30 ENCOUNTER — Inpatient Hospital Stay
Admission: EM | Admit: 2016-06-30 | Discharge: 2016-07-07 | DRG: 392 | Disposition: A | Discharge status: Skilled Nursing Facility | Payer: Medicare Other | Attending: Internal Medicine | Admitting: Internal Medicine

## 2016-06-30 ENCOUNTER — Emergency Department: Payer: Medicare Other

## 2016-06-30 DIAGNOSIS — R531 Weakness: Secondary | ICD-10-CM | POA: Diagnosis present

## 2016-06-30 DIAGNOSIS — K5792 Diverticulitis of intestine, part unspecified, without perforation or abscess without bleeding: Secondary | ICD-10-CM | POA: Diagnosis present

## 2016-06-30 DIAGNOSIS — K5732 Diverticulitis of large intestine without perforation or abscess without bleeding: Secondary | ICD-10-CM | POA: Diagnosis not present

## 2016-06-30 DIAGNOSIS — F419 Anxiety disorder, unspecified: Secondary | ICD-10-CM | POA: Diagnosis present

## 2016-06-30 DIAGNOSIS — R1310 Dysphagia, unspecified: Secondary | ICD-10-CM | POA: Diagnosis present

## 2016-06-30 DIAGNOSIS — R109 Unspecified abdominal pain: Secondary | ICD-10-CM

## 2016-06-30 DIAGNOSIS — F329 Major depressive disorder, single episode, unspecified: Secondary | ICD-10-CM | POA: Diagnosis present

## 2016-06-30 DIAGNOSIS — Z79899 Other long term (current) drug therapy: Secondary | ICD-10-CM

## 2016-06-30 DIAGNOSIS — Z880 Allergy status to penicillin: Secondary | ICD-10-CM

## 2016-06-30 DIAGNOSIS — Z8249 Family history of ischemic heart disease and other diseases of the circulatory system: Secondary | ICD-10-CM

## 2016-06-30 DIAGNOSIS — I10 Essential (primary) hypertension: Secondary | ICD-10-CM | POA: Diagnosis present

## 2016-06-30 HISTORY — DX: Anxiety disorder, unspecified: F41.9

## 2016-06-30 LAB — DIFFERENTIAL
Basophils Absolute: 0.1 10*3/uL (ref 0–0.1)
Basophils Relative: 1 %
Eosinophils Absolute: 0.2 10*3/uL (ref 0–0.7)
Eosinophils Relative: 2 %
Lymphocytes Relative: 14 %
Lymphs Abs: 1.2 10*3/uL (ref 1.0–3.6)
Monocytes Absolute: 0.7 10*3/uL (ref 0.2–0.9)
Monocytes Relative: 9 %
Neutro Abs: 6 10*3/uL (ref 1.4–6.5)
Neutrophils Relative %: 74 %

## 2016-06-30 LAB — URINALYSIS COMPLETE WITH MICROSCOPIC (ARMC ONLY)
Bilirubin Urine: NEGATIVE
Glucose, UA: NEGATIVE mg/dL
Hgb urine dipstick: NEGATIVE
Ketones, ur: NEGATIVE mg/dL
Nitrite: NEGATIVE
Protein, ur: NEGATIVE mg/dL
Specific Gravity, Urine: 1.004 — ABNORMAL LOW (ref 1.005–1.030)
pH: 8 (ref 5.0–8.0)

## 2016-06-30 LAB — CBC
HCT: 40 % (ref 35.0–47.0)
Hemoglobin: 13.6 g/dL (ref 12.0–16.0)
MCH: 31.3 pg (ref 26.0–34.0)
MCHC: 34.1 g/dL (ref 32.0–36.0)
MCV: 91.7 fL (ref 80.0–100.0)
Platelets: 307 10*3/uL (ref 150–440)
RBC: 4.37 MIL/uL (ref 3.80–5.20)
RDW: 13.1 % (ref 11.5–14.5)
WBC: 8.1 10*3/uL (ref 3.6–11.0)

## 2016-06-30 LAB — COMPREHENSIVE METABOLIC PANEL
ALT: 12 U/L — ABNORMAL LOW (ref 14–54)
AST: 23 U/L (ref 15–41)
Albumin: 3.9 g/dL (ref 3.5–5.0)
Alkaline Phosphatase: 73 U/L (ref 38–126)
Anion gap: 7 (ref 5–15)
BUN: 10 mg/dL (ref 6–20)
CO2: 28 mmol/L (ref 22–32)
Calcium: 8.7 mg/dL — ABNORMAL LOW (ref 8.9–10.3)
Chloride: 105 mmol/L (ref 101–111)
Creatinine, Ser: 0.94 mg/dL (ref 0.44–1.00)
GFR calc Af Amer: >60 mL/min (ref >60)
GFR calc non Af Amer: 57 mL/min — ABNORMAL LOW (ref >60)
Glucose, Bld: 100 mg/dL — ABNORMAL HIGH (ref 65–99)
Potassium: 3.8 mmol/L (ref 3.5–5.1)
Sodium: 140 mmol/L (ref 135–145)
Total Bilirubin: 0.6 mg/dL (ref 0.3–1.2)
Total Protein: 7.6 g/dL (ref 6.5–8.1)

## 2016-06-30 MED ORDER — ONDANSETRON HCL 4 MG/2ML IJ SOLN
INTRAMUSCULAR | Status: AC
  Administered 2016-06-30: 4 mg via INTRAVENOUS
  Filled 2016-06-30: qty 2

## 2016-06-30 MED ORDER — ONDANSETRON HCL 4 MG/2ML IJ SOLN
4.0000 mg | Freq: Once | INTRAMUSCULAR | Status: AC
  Administered 2016-06-30: 4 mg via INTRAVENOUS

## 2016-06-30 MED ORDER — IOPAMIDOL (ISOVUE-300) INJECTION 61%
30.0000 mL | Freq: Once | INTRAVENOUS | Status: AC | PRN
  Administered 2016-06-30: 30 mL via ORAL
  Filled 2016-06-30: qty 30

## 2016-06-30 MED ORDER — IOPAMIDOL (ISOVUE-300) INJECTION 61%
100.0000 mL | Freq: Once | INTRAVENOUS | Status: AC | PRN
  Administered 2016-07-01: 100 mL via INTRAVENOUS

## 2016-06-30 NOTE — ED Triage Notes (Signed)
Per EMS, pt has had LLQ abdominal pain that radiates to lower L back x4 days.  Pt was having issues with constipation 4 days ago, and upon finally having a BM states that there was a tearing sensation.  Pt's PCP told her to take laxatives/stool softeners in order to prevent the "tearing" sensation again.  Pt states that she has been having regular BMs since them.  Pt states that there is no foul odor or frequency/pain with urination.  Pt has had a hysterectomy, has had gallbladder removed and had had appendix removed.  Pt denies nausea and vomiting at this time.

## 2016-06-30 NOTE — ED Provider Notes (Signed)
[facility] Emergency Department Provider Note    ____________________________________________   I have reviewed the triage vital signs and the nursing notes.   HISTORY  Chief Complaint Abdominal Pain   History limited by: Not Limited   HPI Marilyn Andrews is a 80 y.o. female who presents to the emergency department today via EMS because of concerns for abdominal pain. The pain is located in the left lower quadrant. The pain started roughly 4-5 days ago. It started while the patient was trying to strain to have a bowel movement.She had been constipated. While she was straining she felt a tearing sensation. She has since had that pain. It has been constant and will become severe at times. The patient has since then had further bowel movements. She has not noticed any blood with bowel movements. She has not had any associated nausea or vomiting. No fevers.   Past Medical History:  Diagnosis Date  . Anxiety   . Hypertension     There are no active problems to display for this patient.   Past Surgical History:  Procedure Laterality Date  . APPENDECTOMY    . CHOLECYSTECTOMY    . HYSTERECTOMY ABDOMINAL WITH SALPINGECTOMY      Prior to Admission medications   Medication Sig Start Date End Date Taking? Authorizing Provider  acetaminophen (TYLENOL) 325 MG tablet Take 650 mg by mouth every 6 (six) hours as needed.    Historical Provider, MD  amLODipine (NORVASC) 5 MG tablet Take 5 mg by mouth daily.    Historical Provider, MD  cephALEXin (KEFLEX) 500 MG capsule Take 1 capsule (500 mg total) by mouth 4 (four) times daily. 02/28/16   Sharyn CreamerMark Quale, MD  clonazePAM (KLONOPIN) 0.5 MG tablet Take 0.5 mg by mouth daily.    Historical Provider, MD  escitalopram (LEXAPRO) 20 MG tablet Take 20 mg by mouth daily.    Historical Provider, MD  ibuprofen (ADVIL,MOTRIN) 600 MG tablet Take 600 mg by mouth every 8 (eight) hours as needed for moderate pain.    Historical Provider, MD   ondansetron (ZOFRAN ODT) 4 MG disintegrating tablet Take 1 tablet (4 mg total) by mouth every 6 (six) hours as needed for nausea or vomiting. 02/28/16   Sharyn CreamerMark Quale, MD  ondansetron (ZOFRAN) 4 MG tablet Take 4 mg by mouth every 8 (eight) hours as needed for nausea or vomiting.    Historical Provider, MD  ranitidine (ZANTAC) 150 MG tablet Take 150 mg by mouth daily.    Historical Provider, MD  saccharomyces boulardii (FLORASTOR) 250 MG capsule Take 250 mg by mouth 2 (two) times daily.    Historical Provider, MD  triamcinolone cream (KENALOG) 0.1 % Apply 1 application topically daily as needed (rash on hands).    Historical Provider, MD    Allergies Penicillins  No family history on file.  Social History Social History  Substance Use Topics  . Smoking status: Never Smoker  . Smokeless tobacco: Never Used  . Alcohol use No    Review of Systems  Constitutional: Negative for fever. Cardiovascular: Negative for chest pain. Respiratory: Negative for shortness of breath. Gastrointestinal: Positive for abdominal pain. Neurological: Negative for headaches, focal weakness or numbness.  10-point ROS otherwise negative.  ____________________________________________   PHYSICAL EXAM:  VITAL SIGNS: ED Triage Vitals  Enc Vitals Group     BP 06/30/16 2211 (!) 182/73     Pulse Rate 06/30/16 2211 67     Resp --      Temp 06/30/16  2211 98.2 F (36.8 C)     Temp Source 06/30/16 2211 Oral     SpO2 06/30/16 2211 96 %     Weight 06/30/16 2212 175 lb (79.4 kg)     Height 06/30/16 2212 5' (1.524 m)     Head Circumference --      Peak Flow --      Pain Score 06/30/16 2213 5   Constitutional: Alert and oriented. Well appearing and in no distress. Eyes: Conjunctivae are normal. Normal extraocular movements. ENT   Head: Normocephalic and atraumatic.   Nose: No congestion/rhinnorhea.   Mouth/Throat: Mucous membranes are moist.   Neck: No  stridor. Hematological/Lymphatic/Immunilogical: No cervical lymphadenopathy. Cardiovascular: Normal rate, regular rhythm.  No murmurs, rubs, or gallops. Respiratory: Normal respiratory effort without tachypnea nor retractions. Breath sounds are clear and equal bilaterally. No wheezes/rales/rhonchi. Gastrointestinal: Soft and tender to palpation in the left lower quadrant. No rebound. No guarding.  Genitourinary: Deferred Musculoskeletal: Normal range of motion in all extremities. No lower extremity edema. Neurologic:  Normal speech and language. No gross focal neurologic deficits are appreciated.  Skin:  Skin is warm, dry and intact. No rash noted. Psychiatric: Mood and affect are normal. Speech and behavior are normal. Patient exhibits appropriate insight and judgment.  ____________________________________________    LABS (pertinent positives/negatives)  Pending  ____________________________________________   EKG  None  ____________________________________________    RADIOLOGY  CT abd/pel pending  ____________________________________________   PROCEDURES  Procedures  ____________________________________________   INITIAL IMPRESSION / ASSESSMENT AND PLAN / ED COURSE  Pertinent labs & imaging results that were available during my care of the patient were reviewed by me and considered in my medical decision making (see chart for details).  Patient presents to the emergency department today because of concerns for left lower quadrant abdominal pain for 4-5 days. Pain did start after the patient was treated for bowel movement. On exam she is tender to palpation in the left lower quadrant. We have concern for diverticulitis, volvulus or other intestinal disorder. Additionally they have concern for potential aortic pathology. Will plan on getting blood work and ct. ____________________________________________   FINAL CLINICAL IMPRESSION(S) / ED DIAGNOSES  Abdominal  pain  Note: This dictation was prepared with Dragon dictation. Any transcriptional errors that result from this process are unintentional    Phineas Semen, MD 06/30/16 2300

## 2016-06-30 NOTE — ED Notes (Signed)
Unsuccessful IV attempt x 2. MD notified and Iris, RN will attempt IV access.

## 2016-07-01 ENCOUNTER — Encounter: Payer: Self-pay | Admitting: Internal Medicine

## 2016-07-01 DIAGNOSIS — R531 Weakness: Secondary | ICD-10-CM | POA: Diagnosis present

## 2016-07-01 DIAGNOSIS — K5732 Diverticulitis of large intestine without perforation or abscess without bleeding: Secondary | ICD-10-CM | POA: Diagnosis present

## 2016-07-01 DIAGNOSIS — I10 Essential (primary) hypertension: Secondary | ICD-10-CM | POA: Diagnosis present

## 2016-07-01 DIAGNOSIS — F419 Anxiety disorder, unspecified: Secondary | ICD-10-CM | POA: Diagnosis present

## 2016-07-01 DIAGNOSIS — K5792 Diverticulitis of intestine, part unspecified, without perforation or abscess without bleeding: Secondary | ICD-10-CM | POA: Diagnosis present

## 2016-07-01 DIAGNOSIS — R1310 Dysphagia, unspecified: Secondary | ICD-10-CM | POA: Diagnosis present

## 2016-07-01 DIAGNOSIS — Z8249 Family history of ischemic heart disease and other diseases of the circulatory system: Secondary | ICD-10-CM | POA: Diagnosis not present

## 2016-07-01 DIAGNOSIS — Z79899 Other long term (current) drug therapy: Secondary | ICD-10-CM | POA: Diagnosis not present

## 2016-07-01 DIAGNOSIS — Z880 Allergy status to penicillin: Secondary | ICD-10-CM | POA: Diagnosis not present

## 2016-07-01 DIAGNOSIS — F329 Major depressive disorder, single episode, unspecified: Secondary | ICD-10-CM | POA: Diagnosis present

## 2016-07-01 LAB — HEMOGLOBIN A1C: Hgb A1c MFr Bld: 5.3 % (ref 4.0–6.0)

## 2016-07-01 LAB — TSH: TSH: 1.709 u[IU]/mL (ref 0.350–4.500)

## 2016-07-01 MED ORDER — SODIUM CHLORIDE 0.9 % IV SOLN
INTRAVENOUS | Status: DC
  Administered 2016-07-01 – 2016-07-03 (×2): via INTRAVENOUS

## 2016-07-01 MED ORDER — PROMETHAZINE HCL 25 MG/ML IJ SOLN
12.5000 mg | INTRAMUSCULAR | Status: DC | PRN
  Administered 2016-07-01 – 2016-07-06 (×11): 12.5 mg via INTRAVENOUS
  Filled 2016-07-01 (×11): qty 1

## 2016-07-01 MED ORDER — MORPHINE SULFATE (PF) 2 MG/ML IV SOLN
2.0000 mg | Freq: Once | INTRAVENOUS | Status: AC
  Administered 2016-07-01: 2 mg via INTRAVENOUS
  Filled 2016-07-01: qty 1

## 2016-07-01 MED ORDER — ONDANSETRON HCL 4 MG/2ML IJ SOLN
4.0000 mg | Freq: Once | INTRAMUSCULAR | Status: AC
  Administered 2016-07-01: 4 mg via INTRAVENOUS

## 2016-07-01 MED ORDER — CIPROFLOXACIN HCL 500 MG PO TABS
500.0000 mg | ORAL_TABLET | Freq: Once | ORAL | Status: DC
  Filled 2016-07-01: qty 1

## 2016-07-01 MED ORDER — CIPROFLOXACIN IN D5W 400 MG/200ML IV SOLN
400.0000 mg | Freq: Two times a day (BID) | INTRAVENOUS | Status: DC
  Administered 2016-07-01 – 2016-07-07 (×13): 400 mg via INTRAVENOUS
  Filled 2016-07-01 (×14): qty 200

## 2016-07-01 MED ORDER — FAMOTIDINE 20 MG PO TABS
20.0000 mg | ORAL_TABLET | Freq: Every day | ORAL | Status: DC
  Administered 2016-07-01: 20 mg via ORAL
  Filled 2016-07-01: qty 1

## 2016-07-01 MED ORDER — METRONIDAZOLE 500 MG PO TABS
500.0000 mg | ORAL_TABLET | Freq: Once | ORAL | Status: DC
  Filled 2016-07-01: qty 1

## 2016-07-01 MED ORDER — MORPHINE SULFATE (PF) 2 MG/ML IV SOLN
2.0000 mg | INTRAVENOUS | Status: DC | PRN
  Administered 2016-07-01 – 2016-07-06 (×6): 2 mg via INTRAVENOUS
  Filled 2016-07-01 (×8): qty 1

## 2016-07-01 MED ORDER — DOCUSATE SODIUM 100 MG PO CAPS
100.0000 mg | ORAL_CAPSULE | Freq: Two times a day (BID) | ORAL | Status: DC
  Administered 2016-07-01 – 2016-07-03 (×5): 100 mg via ORAL
  Filled 2016-07-01 (×5): qty 1

## 2016-07-01 MED ORDER — ONDANSETRON HCL 4 MG PO TABS: 4.0000 mg | ORAL_TABLET | Freq: Four times a day (QID) | ORAL | Status: DC | PRN

## 2016-07-01 MED ORDER — METRONIDAZOLE IN NACL 5-0.79 MG/ML-% IV SOLN
500.0000 mg | Freq: Three times a day (TID) | INTRAVENOUS | Status: DC
  Administered 2016-07-01 – 2016-07-07 (×19): 500 mg via INTRAVENOUS
  Filled 2016-07-01 (×21): qty 100

## 2016-07-01 MED ORDER — HYDRALAZINE HCL 20 MG/ML IJ SOLN: 10.0000 mg | INTRAMUSCULAR | Status: DC | PRN

## 2016-07-01 MED ORDER — ACETAMINOPHEN 650 MG RE SUPP
650.0000 mg | Freq: Four times a day (QID) | RECTAL | Status: DC | PRN
  Filled 2016-07-01: qty 1

## 2016-07-01 MED ORDER — ONDANSETRON HCL 4 MG/2ML IJ SOLN
4.0000 mg | Freq: Once | INTRAMUSCULAR | Status: AC
  Administered 2016-07-01: 4 mg via INTRAVENOUS
  Filled 2016-07-01: qty 2

## 2016-07-01 MED ORDER — ONDANSETRON HCL 4 MG/2ML IJ SOLN
INTRAMUSCULAR | Status: AC
  Administered 2016-07-01: 4 mg via INTRAVENOUS
  Filled 2016-07-01: qty 2

## 2016-07-01 MED ORDER — ACETAMINOPHEN 325 MG PO TABS: 650.0000 mg | ORAL_TABLET | Freq: Four times a day (QID) | ORAL | Status: DC | PRN

## 2016-07-01 MED ORDER — OXYCODONE HCL 5 MG PO TABS
5.0000 mg | ORAL_TABLET | ORAL | Status: DC | PRN
  Administered 2016-07-02 – 2016-07-06 (×16): 5 mg via ORAL
  Filled 2016-07-01 (×16): qty 1

## 2016-07-01 MED ORDER — ONDANSETRON HCL 4 MG/2ML IJ SOLN
4.0000 mg | Freq: Four times a day (QID) | INTRAMUSCULAR | Status: DC | PRN
  Administered 2016-07-01 – 2016-07-02 (×2): 4 mg via INTRAVENOUS
  Filled 2016-07-01 (×2): qty 2

## 2016-07-01 MED ORDER — ESCITALOPRAM OXALATE 10 MG PO TABS
20.0000 mg | ORAL_TABLET | Freq: Every day | ORAL | Status: DC
  Administered 2016-07-01: 20 mg via ORAL
  Filled 2016-07-01: qty 2

## 2016-07-01 MED ORDER — AMLODIPINE BESYLATE 5 MG PO TABS
5.0000 mg | ORAL_TABLET | Freq: Every day | ORAL | Status: DC
  Administered 2016-07-01 – 2016-07-07 (×7): 5 mg via ORAL
  Filled 2016-07-01 (×7): qty 1

## 2016-07-01 MED ORDER — ENOXAPARIN SODIUM 40 MG/0.4ML ~~LOC~~ SOLN
40.0000 mg | SUBCUTANEOUS | Status: DC
  Administered 2016-07-01 – 2016-07-07 (×6): 40 mg via SUBCUTANEOUS
  Filled 2016-07-01 (×6): qty 0.4

## 2016-07-01 NOTE — H&P (Signed)
Marilyn Andrews is an 80 y.o. female.   Chief Complaint: Abdominal pain HPI: The patient with past medical history of hypertension and diverticulitis presents to the emergency department complaining of abdominal pain. It is located in her left lower quadrant and has been gradually worsening over the last 3-4 days. The patient denies blood in her stool but admits to feeling nauseous. She has had a number of episodes of emesis although none in the emergency department. The patient states that her chest hurts in that she is short of breath and that pain seems to be radiating from her abdomen all over her body. Notably, the patient has had shortness of breath for more than 6 months. In the emergency department, oxygen saturations were 100% on room air. CT of the abdomen revealed acute diverticulitis of the descending colon which prompted the emergency department to call for admission.  Past Medical History:  Diagnosis Date  . Anxiety   . Hypertension     Past Surgical History:  Procedure Laterality Date  . APPENDECTOMY    . CHOLECYSTECTOMY    . HYSTERECTOMY ABDOMINAL WITH SALPINGECTOMY      Family History  Problem Relation Age of Onset  . Hypertension Father   . CAD Brother    Social History:  reports that she has never smoked. She has never used smokeless tobacco. She reports that she does not drink alcohol. Her drug history is not on file.  Allergies:  Allergies  Allergen Reactions  . Penicillins Other (See Comments)    Reaction: unknown    Prior to Admission medications   Medication Sig Start Date End Date Taking? Authorizing Provider  acetaminophen (TYLENOL) 325 MG tablet Take 650 mg by mouth every 6 (six) hours as needed for mild pain.    Yes Historical Provider, MD  amLODipine (NORVASC) 5 MG tablet Take 5 mg by mouth daily.   Yes Historical Provider, MD  escitalopram (LEXAPRO) 20 MG tablet Take 20 mg by mouth daily.   Yes Historical Provider, MD  ibuprofen (ADVIL,MOTRIN) 600 MG  tablet Take 600 mg by mouth every 8 (eight) hours as needed for moderate pain.   Yes Historical Provider, MD  ondansetron (ZOFRAN ODT) 4 MG disintegrating tablet Take 1 tablet (4 mg total) by mouth every 6 (six) hours as needed for nausea or vomiting. 02/28/16  Yes Delman Kitten, MD  ranitidine (ZANTAC) 150 MG tablet Take 150 mg by mouth daily.   Yes Historical Provider, MD  triamcinolone cream (KENALOG) 0.1 % Apply 1 application topically daily as needed (rash on hands).   Yes Historical Provider, MD     Results for orders placed or performed during the hospital encounter of 06/30/16 (from the past 48 hour(s))  Comprehensive metabolic panel     Status: Abnormal   Collection Time: 06/30/16 10:20 PM  Result Value Ref Range   Sodium 140 135 - 145 mmol/L   Potassium 3.8 3.5 - 5.1 mmol/L   Chloride 105 101 - 111 mmol/L   CO2 28 22 - 32 mmol/L   Glucose, Bld 100 (H) 65 - 99 mg/dL   BUN 10 6 - 20 mg/dL   Creatinine, Ser 0.94 0.44 - 1.00 mg/dL   Calcium 8.7 (L) 8.9 - 10.3 mg/dL   Total Protein 7.6 6.5 - 8.1 g/dL   Albumin 3.9 3.5 - 5.0 g/dL   AST 23 15 - 41 U/L   ALT 12 (L) 14 - 54 U/L   Alkaline Phosphatase 73 38 - 126 U/L  Total Bilirubin 0.6 0.3 - 1.2 mg/dL   GFR calc non Af Amer 57 (L) >60 mL/min   GFR calc Af Amer >60 >60 mL/min    Comment: (NOTE) The eGFR has been calculated using the CKD EPI equation. This calculation has not been validated in all clinical situations. eGFR's persistently <60 mL/min signify possible Chronic Kidney Disease.    Anion gap 7 5 - 15  CBC     Status: None   Collection Time: 06/30/16 10:20 PM  Result Value Ref Range   WBC 8.1 3.6 - 11.0 K/uL   RBC 4.37 3.80 - 5.20 MIL/uL   Hemoglobin 13.6 12.0 - 16.0 g/dL   HCT 40.0 35.0 - 47.0 %   MCV 91.7 80.0 - 100.0 fL   MCH 31.3 26.0 - 34.0 pg   MCHC 34.1 32.0 - 36.0 g/dL   RDW 13.1 11.5 - 14.5 %   Platelets 307 150 - 440 K/uL  Differential     Status: None   Collection Time: 06/30/16 10:20 PM  Result Value  Ref Range   Neutrophils Relative % 74 %   Neutro Abs 6.0 1.4 - 6.5 K/uL   Lymphocytes Relative 14 %   Lymphs Abs 1.2 1.0 - 3.6 K/uL   Monocytes Relative 9 %   Monocytes Absolute 0.7 0.2 - 0.9 K/uL   Eosinophils Relative 2 %   Eosinophils Absolute 0.2 0 - 0.7 K/uL   Basophils Relative 1 %   Basophils Absolute 0.1 0 - 0.1 K/uL  Urinalysis complete, with microscopic ([facility] only)     Status: Abnormal   Collection Time: 06/30/16 11:08 PM  Result Value Ref Range   Color, Urine STRAW (A) YELLOW   APPearance CLEAR (A) CLEAR   Glucose, UA NEGATIVE NEGATIVE mg/dL   Bilirubin Urine NEGATIVE NEGATIVE   Ketones, ur NEGATIVE NEGATIVE mg/dL   Specific Gravity, Urine 1.004 (L) 1.005 - 1.030   Hgb urine dipstick NEGATIVE NEGATIVE   pH 8.0 5.0 - 8.0   Protein, ur NEGATIVE NEGATIVE mg/dL   Nitrite NEGATIVE NEGATIVE   Leukocytes, UA 2+ (A) NEGATIVE   RBC / HPF 0-5 0 - 5 RBC/hpf   WBC, UA 0-5 0 - 5 WBC/hpf   Bacteria, UA RARE (A) NONE SEEN   Squamous Epithelial / LPF 0-5 (A) NONE SEEN   Mucous PRESENT    Ct Abdomen Pelvis W Contrast  Result Date: 07/01/2016 CLINICAL DATA:  Abdominal pain, left lower quadrant. Duration 5 days. EXAM: CT ABDOMEN AND PELVIS WITH CONTRAST TECHNIQUE: Multidetector CT imaging of the abdomen and pelvis was performed using the standard protocol following bolus administration of intravenous contrast. CONTRAST:  151m ISOVUE-300 IOPAMIDOL (ISOVUE-300) INJECTION 61% COMPARISON:  02/28/2016 FINDINGS: Lower chest:  No significant abnormality Hepatobiliary: The liver is remarkable only for a benign-appearing sub cm hypodensity in the posterior right hepatic lobe, unchanged. This most likely is a cyst although it is too small for definitive characterization. There is mild post cholecystectomy expansion of the extrahepatic bile ducts. Pancreas: Normal Spleen: Normal Adrenals/Urinary Tract: The adrenals and kidneys are normal in appearance. There is no urinary calculus evident. There is no  hydronephrosis or ureteral dilatation. Collecting systems and ureters appear unremarkable. The urinary bladder is unremarkable. Stomach/Bowel: There is acute inflammation surrounding a diverticulum of the distal descending colon with appearances typical of acute diverticulitis. There is no abscess. There is no bowel obstruction. Moderate diverticulosis is present the remainder of the left hemicolon. The stomach and small bowel are unremarkable. Vascular/Lymphatic: The  abdominal aorta is normal in caliber. There is mild atherosclerotic calcification. There is no adenopathy in the abdomen or pelvis. Reproductive: Hysterectomy.  No adnexal abnormalities. Other: No ascites. Musculoskeletal: No significant skeletal lesion. Moderately severe lower lumbar facet arthropathy, greater on the left. IMPRESSION: Acute diverticulitis, distal descending colon.  No abscess. Electronically Signed   By: Andreas Newport M.D.   On: 07/01/2016 00:53    Review of Systems  Constitutional: Negative for chills and fever.  HENT: Negative for sore throat and tinnitus.   Eyes: Negative for blurred vision and redness.  Respiratory: Positive for shortness of breath (chronic). Negative for cough.   Cardiovascular: Negative for chest pain, palpitations, orthopnea and PND.  Gastrointestinal: Positive for abdominal pain and nausea. Negative for diarrhea and vomiting.  Genitourinary: Negative for dysuria, frequency and urgency.  Musculoskeletal: Negative for joint pain and myalgias.  Skin: Negative for rash.       No lesions  Neurological: Negative for speech change, focal weakness and weakness.  Endo/Heme/Allergies: Does not bruise/bleed easily.       No temperature intolerance  Psychiatric/Behavioral: Negative for depression and suicidal ideas.    Blood pressure (!) 149/52, pulse 84, temperature 98.2 F (36.8 C), temperature source Oral, resp. rate (!) 22, height 5' (1.524 m), weight 79.4 kg (175 lb), SpO2 97 %. Physical  Exam  Vitals reviewed. Constitutional: She is oriented to person, place, and time. She appears well-developed and well-nourished. No distress.  HENT:  Head: Normocephalic and atraumatic.  Mouth/Throat: Oropharynx is clear and moist.  Eyes: Conjunctivae and EOM are normal. Pupils are equal, round, and reactive to light. No scleral icterus.  Neck: Normal range of motion. Neck supple. No JVD present. No tracheal deviation present. No thyromegaly present.  Cardiovascular: Normal rate, regular rhythm and normal heart sounds.  Exam reveals no gallop and no friction rub.   No murmur heard. Respiratory: Effort normal and breath sounds normal.  GI: Soft. Bowel sounds are normal. She exhibits no distension and no mass. There is tenderness (LLQ). There is guarding (voluntary). There is no rebound.  Genitourinary:  Genitourinary Comments: Deferred  Musculoskeletal: Normal range of motion. She exhibits no edema.  Lymphadenopathy:    She has no cervical adenopathy.  Neurological: She is alert and oriented to person, place, and time. No cranial nerve deficit. She exhibits normal muscle tone.  Skin: Skin is warm and dry. No rash noted. No erythema.  Psychiatric: She has a normal mood and affect. Her behavior is normal. Judgment and thought content normal.     Assessment/Plan This is a 80 year old female admitted for diverticulitis.  1. Diverticulitis: The patient has no signs or symptoms of sepsis. She was unable to tolerate oral antibiotics in the emergency department thus we will administer Cipro and Flagyl IV. I place patient on a clear liquid diet which we will advance as tolerated. 2. Hypertension: Controlled; continue amlodipine 3. Depression: Continue Lexapro 4. DVT prophylaxis: Lovenox 5. GI prophylaxis: Pepcid The patient is a full code. Time spent on admission orders and patient care approximately 45 minutes  Harrie Foreman, MD 07/01/2016, 5:27 AM

## 2016-07-01 NOTE — Progress Notes (Signed)
Patient was sleeping and noted left leg twitching. Assessed BP to find 171/60. PRN Hydralazine ordered, not in parameters to give. Notified MD. IV access lost, multiple attempts w/o success, notified nurse supervisor.

## 2016-07-01 NOTE — ED Notes (Signed)
Dr Diamond at bedside. 

## 2016-07-01 NOTE — ED Notes (Signed)
Upon this RN entry to room pt sleeping, respirations even and unlabored. Pt awoken to inform pt will be transferred to admitting room.

## 2016-07-01 NOTE — ED Notes (Signed)
Patient transported to CT via stretcher.

## 2016-07-01 NOTE — Progress Notes (Signed)
[facility] [place] [facility]   PATIENT NAME: Marilyn Andrews    MRN#:  [id number]  DATE OF BIRTH:  [birth year]  SUBJECTIVE:  Hospital Day: 0 days Marilyn Andrews is a 80 y.o. female presenting with Abdominal Pain .   Overnight events: No acute overnight events Interval Events: Complains of nausea since this morning  REVIEW OF SYSTEMS:  CONSTITUTIONAL: No fever, fatigue or weakness.  EYES: No blurred or double vision.  EARS, NOSE, AND THROAT: No tinnitus or ear pain.  RESPIRATORY: No cough, shortness of breath, wheezing or hemoptysis.  CARDIOVASCULAR: No chest pain, orthopnea, edema.  GASTROINTESTINAL: Positive nausea, vomiting,  abdominal pain. Denies diarrhea constipation GENITOURINARY: No dysuria, hematuria.  ENDOCRINE: No polyuria, nocturia,  HEMATOLOGY: No anemia, easy bruising or bleeding SKIN: No rash or lesion. MUSCULOSKELETAL: No joint pain or arthritis.   NEUROLOGIC: No tingling, numbness, weakness.  PSYCHIATRY: No anxiety or depression.   DRUG ALLERGIES:   Allergies  Allergen Reactions  . Penicillins Other (See Comments)    Reaction: unknown    VITALS:  Blood pressure (!) 152/65, pulse 96, temperature 97.9 F (36.6 C), temperature source Oral, resp. rate 18, height 5' (1.524 m), weight 79.4 kg (175 lb), SpO2 94 %.  PHYSICAL EXAMINATION:  VITAL SIGNS: Vitals:   07/01/16 0551 07/01/16 0740  BP: (!) 183/74 (!) 152/65  Pulse: 90 96  Resp: 16 18  Temp: 97.8 F (36.6 C) 97.9 F (36.6 C)   GENERAL:80 y.o.female currently in no acute distress.  HEAD: Normocephalic, atraumatic.  EYES: Pupils equal, round, reactive to light. Extraocular muscles intact. No scleral icterus.  MOUTH: Moist mucosal membrane. Dentition intact. No abscess noted.  EAR, NOSE, THROAT: Clear without exudates. No external lesions.  NECK: Supple. No thyromegaly. No nodules. No JVD.  PULMONARY: Clear to ascultation, without wheeze rails or rhonci. No use of accessory  muscles, Good respiratory effort. good air entry bilaterally CHEST: Nontender to palpation.  CARDIOVASCULAR: S1 and S2. Regular rate and rhythm. No murmurs, rubs, or gallops. No edema. Pedal pulses 2+ bilaterally.  GASTROINTESTINAL: Soft, nontender, nondistended. No masses. Positive bowel sounds. No hepatosplenomegaly.  MUSCULOSKELETAL: No swelling, clubbing, or edema. Range of motion full in all extremities.  NEUROLOGIC: Cranial nerves II through XII are intact. No gross focal neurological deficits. Sensation intact. Reflexes intact.  SKIN: No ulceration, lesions, rashes, or cyanosis. Skin warm and dry. Turgor intact.  PSYCHIATRIC: Mood, affect within normal limits. The patient is awake, alert and oriented x 3. Insight, judgment intact.      LABORATORY PANEL:   CBC  Recent Labs Lab 06/30/16 2220  WBC 8.1  HGB 13.6  HCT 40.0  PLT 307   ------------------------------------------------------------------------------------------------------------------  Chemistries   Recent Labs Lab 06/30/16 2220  NA 140  K 3.8  CL 105  CO2 28  GLUCOSE 100*  BUN 10  CREATININE 0.94  CALCIUM 8.7*  AST 23  ALT 12*  ALKPHOS 73  BILITOT 0.6   ------------------------------------------------------------------------------------------------------------------  Cardiac Enzymes No results for input(s): TROPONINI in the last 168 hours. ------------------------------------------------------------------------------------------------------------------  RADIOLOGY:  Ct Abdomen Pelvis W Contrast  Result Date: 07/01/2016 CLINICAL DATA:  Abdominal pain, left lower quadrant. Duration 5 days. EXAM: CT ABDOMEN AND PELVIS WITH CONTRAST TECHNIQUE: Multidetector CT imaging of the abdomen and pelvis was performed using the standard protocol following bolus administration of intravenous contrast. CONTRAST:  ISOVUE-300 IOPAMIDOL (ISOVUE-300) INJECTION 61% COMPARISON:  02/28/2016 FINDINGS: Lower chest:  No  significant abnormality Hepatobiliary: The liver is remarkable only  for a benign-appearing sub cm hypodensity in the posterior right hepatic lobe, unchanged. This most likely is a cyst although it is too small for definitive characterization. There is mild post cholecystectomy expansion of the extrahepatic bile ducts. Pancreas: Normal Spleen: Normal Adrenals/Urinary Tract: The adrenals and kidneys are normal in appearance. There is no urinary calculus evident. There is no hydronephrosis or ureteral dilatation. Collecting systems and ureters appear unremarkable. The urinary bladder is unremarkable. Stomach/Bowel: There is acute inflammation surrounding a diverticulum of the distal descending colon with appearances typical of acute diverticulitis. There is no abscess. There is no bowel obstruction. Moderate diverticulosis is present the remainder of the left hemicolon. The stomach and small bowel are unremarkable. Vascular/Lymphatic: The abdominal aorta is normal in caliber. There is mild atherosclerotic calcification. There is no adenopathy in the abdomen or pelvis. Reproductive: Hysterectomy.  No adnexal abnormalities. Other: No ascites. Musculoskeletal: No significant skeletal lesion. Moderately severe lower lumbar facet arthropathy, greater on the left. IMPRESSION: Acute diverticulitis, distal descending colon.  No abscess. Electronically Signed   By: Ellery Plunkaniel R Mitchell M.D.   On: 07/01/2016 00:53    EKG:   Orders placed or performed during the hospital encounter of 02/28/16  . EKG 12-Lead  . EKG 12-Lead  . ED EKG  . ED EKG  . EKG    ASSESSMENT AND PLAN:   Marilyn Andrews is a 80 y.o. female presenting with Abdominal Pain . Admitted 06/30/2016 : Day #: 0 days 1. Acute diverticulitis: Continue Cipro Flagyl, change diet nothing by mouth as she is unable to tolerate 2. Essential hypertension Norvasc, add as needed hydralazine 3. Venous thromboembolism prophylactic: Lovenox   All the records are  reviewed and case discussed with Care Management/Social Workerr. Management plans discussed with the patient, family and they are in agreement.  CODE STATUS: full TOTAL TIME TAKING CARE OF THIS PATIENT: 28 minutes.   POSSIBLE D/C IN 2-3DAYS, DEPENDING ON CLINICAL CONDITION.   Khyren Hing,  Mardi MainlandDavid K M.D on 07/01/2016 at 12:09 PM  Between 7am to 6pm - Pager - [phone]  After 6pm: House Pager: - [phone]  Fabio [facility]  Office  [phone]  CC: Primary care physician; [facility]

## 2016-07-01 NOTE — ED Provider Notes (Signed)
I assumed care of the patient from Dr. Derrill KayGoodman 11:00 p.m. CT scan of the abdomen and pelvis revealed: CLINICAL DATA:  Abdominal pain, left lower quadrant. Duration 5 days.  EXAM: CT ABDOMEN AND PELVIS WITH CONTRAST  TECHNIQUE: Multidetector CT imaging of the abdomen and pelvis was performed using the standard protocol following bolus administration of intravenous contrast.  CONTRAST:  100mL ISOVUE-300 IOPAMIDOL (ISOVUE-300) INJECTION 61%  COMPARISON:  02/28/2016  FINDINGS: Lower chest:  No significant abnormality  Hepatobiliary: The liver is remarkable only for a benign-appearing sub cm hypodensity in the posterior right hepatic lobe, unchanged. This most likely is a cyst although it is too small for definitive characterization. There is mild post cholecystectomy expansion of the extrahepatic bile ducts.  Pancreas: Normal  Spleen: Normal  Adrenals/Urinary Tract: The adrenals and kidneys are normal in appearance. There is no urinary calculus evident. There is no hydronephrosis or ureteral dilatation. Collecting systems and ureters appear unremarkable. The urinary bladder is unremarkable.  Stomach/Bowel: There is acute inflammation surrounding a diverticulum of the distal descending colon with appearances typical of acute diverticulitis. There is no abscess. There is no bowel obstruction. Moderate diverticulosis is present the remainder of the left hemicolon.  The stomach and small bowel are unremarkable.  Vascular/Lymphatic: The abdominal aorta is normal in caliber. There is mild atherosclerotic calcification. There is no adenopathy in the abdomen or pelvis.  Reproductive: Hysterectomy.  No adnexal abnormalities.  Other: No ascites.  Musculoskeletal: No significant skeletal lesion. Moderately severe lower lumbar facet arthropathy, greater on the left.  IMPRESSION: Acute diverticulitis, distal descending colon.  No abscess.   Electronically  Signed   By: Ellery Plunkaniel R Mitchell M.D.   On: 07/01/2016 00:53  Given patient's ongoing pain and inability to control her pain with IV analgesic patient discussed with Dr. Everlene FarrierPabon general surgery who will consult on the patient. Patient admitted to Dr. Sheryle Haildiamond hospitalist   Darci Currentandolph N Emily Forse, MD 07/01/16 [phone]

## 2016-07-01 NOTE — Progress Notes (Signed)
Pharmacy Antibiotic Note  Marilyn Andrews is a 80 y.o. female admitted on 06/30/2016 with IAI.  Pharmacy has been consulted for ciprofloxacin dosing.  Plan: Ciprofloxacin 400 mg IV Q12H  Height: 5' (152.4 cm) Weight: 175 lb (79.4 kg) IBW/kg (Calculated) : 45.5  Temp (24hrs), Avg:98 F (36.7 C), Min:97.8 F (36.6 C), Max:98.2 F (36.8 C)   Recent Labs Lab 06/30/16 2220  WBC 8.1  CREATININE 0.94    Estimated Creatinine Clearance: 46 mL/min (by C-G formula based on SCr of 0.94 mg/dL).    Allergies  Allergen Reactions  . Penicillins Other (See Comments)    Reaction: unknown    Thank you for allowing pharmacy to be a part of this patient's care.  Marilyn Andrews, Pharm.D., BCPS Clinical Pharmacist 07/01/2016 6:07 AM

## 2016-07-01 NOTE — ED Notes (Signed)
Pt voided on pads, had a diarrhea episode. MD notified. Pt cleaned and new bed sheets provided on stretcher. Warm blankets provided to patient.

## 2016-07-02 ENCOUNTER — Encounter: Payer: Self-pay | Admitting: Internal Medicine

## 2016-07-02 MED ORDER — ESCITALOPRAM OXALATE 10 MG PO TABS
20.0000 mg | ORAL_TABLET | Freq: Every day | ORAL | Status: DC
  Administered 2016-07-02 – 2016-07-07 (×6): 20 mg via ORAL
  Filled 2016-07-02 (×6): qty 2

## 2016-07-02 MED ORDER — ONDANSETRON HCL 4 MG/2ML IJ SOLN
4.0000 mg | Freq: Four times a day (QID) | INTRAMUSCULAR | Status: DC | PRN
  Administered 2016-07-02 – 2016-07-05 (×8): 4 mg via INTRAVENOUS
  Filled 2016-07-02 (×9): qty 2

## 2016-07-02 MED ORDER — ONDANSETRON HCL 4 MG PO TABS
4.0000 mg | ORAL_TABLET | Freq: Four times a day (QID) | ORAL | Status: DC | PRN
  Administered 2016-07-03 – 2016-07-07 (×8): 4 mg via ORAL
  Filled 2016-07-02 (×8): qty 1

## 2016-07-02 NOTE — Plan of Care (Signed)
Problem: Bowel/Gastric: Goal: Will not experience complications related to bowel motility Outcome: Progressing Pt has remained free of falls/injury this shift. Pt is making minimal progress toward goals.

## 2016-07-02 NOTE — Care Management Note (Signed)
Case Management Note  Patient Details  Name: Laure Kidneyeggy M Foot MRN: [id number] Date of Birth: [birth year]  Subjective/Objective: Spoke with patient for discharge planning. Patient is up to bedside chair. Son is present in room . Patient stated that she lives at home with here sister and that her sister isn't able to help her. She stated that her sister is home with the dog and the dog takes care of her. Her son stated that the patient sister drives sometimes to grocery store and local places and that his brother and sister in law live close by and visit frequently and help out. The patient has a rolling walker at home ans stated she ambulates using that hand holding on to the walls. Son stated that the patient just got pain medications and that she was a little out of it right now. Will continue to follow.                     Action/Plan: Anticipated discharge plan is home with Home Health.   Expected Discharge Date:                  Expected Discharge Plan:  Home w Home Health Services  In-House Referral:     Discharge planning Services  CM Consult  Post Acute Care Choice:    Choice offered to:     DME Arranged:    DME Agency:     HH Arranged:    HH Agency:     Status of Service:  In process, will continue to follow  If discussed at Long Length of Stay Meetings, dates discussed:    Additional Comments:  Adonis HugueninBerkhead, Neeraj Housand L, RN 07/02/2016, 3:49 PM

## 2016-07-02 NOTE — Progress Notes (Signed)
[facility] [place] at [facility]   PATIENT NAME: Marilyn Andrews    MRN#:  [id number]  DATE OF BIRTH:  [birth year]  SUBJECTIVE:  Hospital Day: 1 day Marilyn Andrews is a 80 y.o. female presenting with Abdominal Pain .  Continues to have lower abdominal pain. Nausea but no vomiting. Feels extremely weak.  REVIEW OF SYSTEMS:  CONSTITUTIONAL: No fever, fatigue or weakness.  EYES: No blurred or double vision.  EARS, NOSE, AND THROAT: No tinnitus or ear pain.  RESPIRATORY: No cough, shortness of breath, wheezing or hemoptysis.  CARDIOVASCULAR: No chest pain, orthopnea, edema.  GASTROINTESTINAL: Positive nausea, vomiting,  abdominal pain. Denies diarrhea constipation GENITOURINARY: No dysuria, hematuria.  ENDOCRINE: No polyuria, nocturia,  HEMATOLOGY: No anemia, easy bruising or bleeding SKIN: No rash or lesion. MUSCULOSKELETAL: No joint pain or arthritis.   NEUROLOGIC: No tingling, numbness, weakness.  PSYCHIATRY: No anxiety or depression.   DRUG ALLERGIES:   Allergies  Allergen Reactions  . Penicillins Other (See Comments)    Reaction as a child, patient does not recall  Has patient had a PCN reaction causing immediate rash, facial/tongue/throat swelling, SOB or lightheadedness with hypotension: unknown Has patient had a PCN reaction causing severe rash involving mucus membranes or skin necrosis: unknown Has patient had a PCN reaction that required hospitalization: unknown Has patient had a PCN reaction occurring within the last 10 years: No If all of the above answers are "NO", then may proceed with Cephalosporin use.    VITALS:  Blood pressure (!) 148/55, pulse 62, temperature 98.4 F (36.9 C), temperature source Oral, resp. rate 16, height 5' (1.524 m), weight 80.6 kg (177 lb 12.3 oz), SpO2 94 %.  PHYSICAL EXAMINATION:  VITAL SIGNS: Vitals:   07/02/16 0403 07/02/16 0722  BP: (!) 159/67 (!) 148/55  Pulse: 66 62  Resp: 16   Temp: 98 F (36.7 C) 98.4  F (36.9 C)   GENERAL:80 y.o.female currently in no acute distress.  HEAD: Normocephalic, atraumatic.  EYES: Pupils equal, round, reactive to light. Extraocular muscles intact. No scleral icterus.  MOUTH: Moist mucosal membrane. Dentition intact. No abscess noted.  EAR, NOSE, THROAT: Clear without exudates. No external lesions.  NECK: Supple. No thyromegaly. No nodules. No JVD.  PULMONARY: Clear to ascultation, without wheeze rails or rhonci. No use of accessory muscles, Good respiratory effort. good air entry bilaterally CHEST: Nontender to palpation.  CARDIOVASCULAR: S1 and S2. Regular rate and rhythm. No murmurs, rubs, or gallops. No edema. Pedal pulses 2+ bilaterally.  GASTROINTESTINAL: Soft, nondistended. No masses. Positive bowel sounds. No hepatosplenomegaly. Lower abdominal tenderness MUSCULOSKELETAL: No swelling, clubbing, or edema. Range of motion full in all extremities.  NEUROLOGIC: Cranial nerves II through XII are intact. No gross focal neurological deficits. Sensation intact. Reflexes intact.  SKIN: No ulceration, lesions, rashes, or cyanosis. Skin warm and dry. Turgor intact.  PSYCHIATRIC: Mood, affect within normal limits. The patient is awake, alert and oriented x 3. Insight, judgment intact.   LABORATORY PANEL:   CBC  Recent Labs Lab 06/30/16 2220  WBC 8.1  HGB 13.6  HCT 40.0  PLT 307   ------------------------------------------------------------------------------------------------------------------  Chemistries   Recent Labs Lab 06/30/16 2220  NA 140  K 3.8  CL 105  CO2 28  GLUCOSE 100*  BUN 10  CREATININE 0.94  CALCIUM 8.7*  AST 23  ALT 12*  ALKPHOS 73  BILITOT 0.6   ------------------------------------------------------------------------------------------------------------------  Cardiac Enzymes No results for input(s): TROPONINI in the last 168  hours. ------------------------------------------------------------------------------------------------------------------  RADIOLOGY:  Ct Abdomen Pelvis W Contrast  Result Date: 07/01/2016 CLINICAL DATA:  Abdominal pain, left lower quadrant. Duration 5 days. EXAM: CT ABDOMEN AND PELVIS WITH CONTRAST TECHNIQUE: Multidetector CT imaging of the abdomen and pelvis was performed using the standard protocol following bolus administration of intravenous contrast. CONTRAST:  ISOVUE-300 IOPAMIDOL (ISOVUE-300) INJECTION 61% COMPARISON:  02/28/2016 FINDINGS: Lower chest:  No significant abnormality Hepatobiliary: The liver is remarkable only for a benign-appearing sub cm hypodensity in the posterior right hepatic lobe, unchanged. This most likely is a cyst although it is too small for definitive characterization. There is mild post cholecystectomy expansion of the extrahepatic bile ducts. Pancreas: Normal Spleen: Normal Adrenals/Urinary Tract: The adrenals and kidneys are normal in appearance. There is no urinary calculus evident. There is no hydronephrosis or ureteral dilatation. Collecting systems and ureters appear unremarkable. The urinary bladder is unremarkable. Stomach/Bowel: There is acute inflammation surrounding a diverticulum of the distal descending colon with appearances typical of acute diverticulitis. There is no abscess. There is no bowel obstruction. Moderate diverticulosis is present the remainder of the left hemicolon. The stomach and small bowel are unremarkable. Vascular/Lymphatic: The abdominal aorta is normal in caliber. There is mild atherosclerotic calcification. There is no adenopathy in the abdomen or pelvis. Reproductive: Hysterectomy.  No adnexal abnormalities. Other: No ascites. Musculoskeletal: No significant skeletal lesion. Moderately severe lower lumbar facet arthropathy, greater on the left. IMPRESSION: Acute diverticulitis, distal descending colon.  No abscess. Electronically  Signed   By: Ellery Plunk M.D.   On: 07/01/2016 00:53    EKG:   Orders placed or performed during the hospital encounter of 06/30/16  . ED EKG  . ED EKG    ASSESSMENT AND PLAN:   Marilyn Andrews is a 80 y.o. female presenting with Abdominal Pain  Admitted 06/30/2016 : Day #: 1 day   1. Acute diverticulitis, descending colon No improvement Clear liquids. IV Cipro and Flagyl.  2. Essential hypertension Norvasc, add as needed hydralazine  3. Venous thromboembolism prophylactic: Lovenox  4. Weakness Consult physical therapy  All the records are reviewed and case discussed with Care Management/Social Workerr. Management plans discussed with the patient, family and they are in agreement.  CODE STATUS: full   TOTAL TIME TAKING CARE OF THIS PATIENT: 30 minutes.   POSSIBLE D/C IN 2-3 DAYS, DEPENDING ON CLINICAL CONDITION.  Milagros Loll R M.D on 07/02/2016 at 10:50 AM  Between 7am to 6pm - Pager - [phone]  After 6pm: House Pager: - [phone]  [facility]  Office  [phone]  CC: Primary care physician; [facility]

## 2016-07-02 NOTE — Progress Notes (Signed)
Shift assessment completed at 0800. Pt is HOH, flat affect, answered question with soft voice. Respirations are shallow, lungs are clear bilat, pt is alert and oriented. HR si regular, abdomen is soft, pt c/o pain to her lower quads, is incontinent of urine, bs are hyperactive. Pt stated she cannot recall when her last bm was. PPP, no edema noted. PIV#22 intact to L hand with iv ns infusign at 17600mls/hr, PIV#22 intact to Rac, both sites are free of redness and swelling. Pt received phenergan for nasuea and oxycodone for pain at 1000. Pt has had no vomiting. Dr. Elpidio AnisSudini rounded on pt, increased pt's diet to clear liquids, pt stated she was unable to tolerate this at lunch. IVF has been decreased to 7350mls/hr. Physical therapy is working with pt at this time.

## 2016-07-02 NOTE — Evaluation (Signed)
Physical Therapy Evaluation Patient Details Name: Marilyn Andrews MRN: [id number] DOB: [birth year] Today's Date: 07/02/2016   History of Present Illness  Pt is a 80 y.o. female presenting to hospital with L lower quadrant abdominal pain.  Pt admitted with acute diverticulitis of descending colon.  PMH includes htn and anxiety.  Clinical Impression  Prior to admission, pt was independent (pt reports sometimes using RW for ambulation as needed).  Pt lives with her sister in 1 level home with 4 STE with B railing.  Currently pt is SBA supine to sit and CGA with transfers and ambulation with RW a few feet bed to chair.  Limited distance ambulated d/t pt c/o significant dizziness sitting on edge of bed and dizziness unchanged once getting to chair (pt agreeable to sitting in chair though end of session).  Pt then reporting she had the same amount of dizziness laying in bed (prior to getting up with PT).  BP 143/73 in sitting with HR 65 bpm and O2 91% on RA (nursing notified of vitals and dizziness).  Pt would benefit from skilled PT to address noted impairments and functional limitations.  Anticipate once pt's dizziness improves, pt will progress well with functional mobility and be able to discharge home with HHPT and use of RW when medically appropriate.     Follow Up Recommendations  (HHPT pending pt's progress)    Equipment Recommendations   (pt already owns RW)    Recommendations for Other Services       Precautions / Restrictions Precautions Precautions: Fall Restrictions Weight Bearing Restrictions: No      Mobility  Bed Mobility Overal bed mobility: Needs Assistance Bed Mobility: Supine to Sit     Supine to sit: Supervision;HOB elevated     General bed mobility comments: mild increased time to perform; SBA  Transfers Overall transfer level: Needs assistance Equipment used: Rolling walker (2 wheeled) Transfers: Sit to/from Stand Sit to Stand: Min guard         General  transfer comment: mild increased time to perform but steady without loss of balance  Ambulation/Gait Ambulation/Gait assistance: Min guard Ambulation Distance (Feet): 3 Feet (bed to chair) Assistive device:  (youth sized RW)   Gait velocity: decreased   General Gait Details: decreased B step length/foot clearance/heelstrike; mild unsteadiness but no overt loss of balance  Stairs            Wheelchair Mobility    Modified Rankin (Stroke Patients Only)       Balance Overall balance assessment: Needs assistance Sitting-balance support: Bilateral upper extremity supported;Feet supported Sitting balance-Leahy Scale: Good     Standing balance support: Bilateral upper extremity supported (on RW) Standing balance-Leahy Scale: Fair                               Pertinent Vitals/Pain Pain Assessment: 0-10 Pain Score: 8  Pain Location: L lower quadrant abdominal pain Pain Descriptors / Indicators: Tender;Aching Pain Intervention(s): Limited activity within patient's tolerance;Monitored during session;Repositioned;Patient requesting pain meds-RN notified    Home Living Family/patient expects to be discharged to:: Private residence Living Arrangements: Other relatives (Sister) Available Help at Discharge: Family Type of Home: House Home Access: Stairs to enter Entrance Stairs-Rails: Right;Left;Can reach both Secretary/administrator of Steps: 4 Home Layout: One level Home Equipment: Walker - 2 wheels      Prior Function Level of Independence: Independent with assistive device(s)  Comments: Pt sometimes uses RW but otherwise no AD.     Hand Dominance        Extremity/Trunk Assessment   Upper Extremity Assessment: Generalized weakness           Lower Extremity Assessment: Generalized weakness         Communication   Communication: HOH  Cognition Arousal/Alertness: Awake/alert Behavior During Therapy: WFL for tasks  assessed/performed Overall Cognitive Status: Within Functional Limits for tasks assessed                      General Comments General comments (skin integrity, edema, etc.): Pt laying in bed resting upon PT arrival.  Pt agreeable to PT session.    Exercises Other Exercises Other Exercises: Pt refused LE ex's d/t concerns of making her "legs hurt"      Assessment/Plan    PT Assessment Patient needs continued PT services  PT Diagnosis Difficulty walking;Generalized weakness   PT Problem List Decreased strength;Decreased activity tolerance;Decreased balance;Decreased mobility  PT Treatment Interventions DME instruction;Gait training;Stair training;Functional mobility training;Therapeutic activities;Therapeutic exercise;Balance training;Patient/family education   PT Goals (Current goals can be found in the Care Plan section) Acute Rehab PT Goals Patient Stated Goal: to have less pain and not be dizzy PT Goal Formulation: With patient Time For Goal Achievement: 07/16/16 Potential to Achieve Goals: Fair    Frequency Min 2X/week   Barriers to discharge        Co-evaluation               End of Session Equipment Utilized During Treatment: Gait belt Activity Tolerance: Other (comment) (Limited d/t c/o dizziness (nursing notified)) Patient left: in chair;with call bell/phone within reach;with chair alarm set Nurse Communication: Mobility status;Precautions (dizziness and vitals)         Time: [id number] PT Time Calculation (min) (ACUTE ONLY): 26 min   Charges:   PT Evaluation $PT Eval Low Complexity: 1 Procedure     PT G CodesHendricks Limes:        Giles Currie 07/02/2016, 3:26 PM Hendricks LimesEmily Aisea Bouldin, PT [phone]

## 2016-07-02 NOTE — Progress Notes (Signed)
Pt continues to c/o nausea, did not attempt to tolerate clears for dinner. Pt stated that she is dizzy when this writer asked, pt does not know how long she has been dizzy, or when the dizziness started,etc. Family member at bedside questioning if meds for pain/nausea could be causing the dizziness. This Clinical research associatewriter explained that pt did not mention this symptom when the doctor rounded this morning, only when physical therapy worked with pt. At this time, pt is sitting up in bed and talking to visitor. Pt is able to move all four extremities, is not leaning or favoring one side, denied headache, etc. Pt has had no emesis this shift.

## 2016-07-03 ENCOUNTER — Inpatient Hospital Stay: Payer: Medicare Other

## 2016-07-03 MED ORDER — HYDROCORTISONE 0.5 % EX CREA
TOPICAL_CREAM | CUTANEOUS | Status: DC | PRN
  Administered 2016-07-03 – 2016-07-04 (×4): via TOPICAL
  Filled 2016-07-03: qty 28.35

## 2016-07-03 MED ORDER — SENNOSIDES-DOCUSATE SODIUM 8.6-50 MG PO TABS
2.0000 | ORAL_TABLET | Freq: Two times a day (BID) | ORAL | Status: DC
  Administered 2016-07-03 – 2016-07-07 (×8): 2 via ORAL
  Filled 2016-07-03 (×8): qty 2

## 2016-07-03 MED ORDER — POLYETHYLENE GLYCOL 3350 17 G PO PACK: 17.0000 g | PACK | Freq: Every day | ORAL | Status: DC | PRN

## 2016-07-03 NOTE — Care Management Important Message (Signed)
Important Message  Patient Details  Name: Marilyn Andrews MRN: [id number] Date of Birth: 03/08/1936   Medicare Important Message Given:  Yes    Adonis HugueninBerkhead, Karl Erway L, RN 07/03/2016, 8:02 AM

## 2016-07-03 NOTE — Progress Notes (Signed)
PT Cancellation Note  Patient Details Name: Laure Kidneyeggy M Cravens MRN: [id number] DOB: February 22, 1936   Cancelled Treatment:    Reason Eval/Treat Not Completed: Patient declined, no reason specified.  Upon PT entering room, pt laying in bed with washcloth to forehead and emesis bag in lap.  Pt reports not feeling well and with emesis earlier (nursing aware).  Will re-attempt PT treatment at a later date/time as able.   Hendricks Limesmily Guida Asman 07/03/2016, 2:21 PM Hendricks LimesEmily Miller [place], PT [phone]

## 2016-07-03 NOTE — Progress Notes (Signed)
Pharmacy Antibiotic Note  Cleotis Nippereggy M Calcaterra is a 80 y.o. female admitted on 06/30/2016 with IAI.  Pharmacy has been consulted for ciprofloxacin dosing.  Plan: Ciprofloxacin 400 mg IV Q12H  Patient continues to complain of nausea/vomiting, requiring antiemetics and not tolerating diet. Will continue IV antibiotics at this time.   Height: 5' (152.4 cm) Weight: 177 lb 4.8 oz (80.4 kg) IBW/kg (Calculated) : 45.5  Temp (24hrs), Avg:98.3 F (36.8 C), Min:98.1 F (36.7 C), Max:98.6 F (37 C)   Recent Labs Lab 06/30/16 2220  WBC 8.1  CREATININE 0.94    Estimated Creatinine Clearance: 46.3 mL/min (by C-G formula based on SCr of 0.94 mg/dL).    Allergies  Allergen Reactions  . Penicillins Other (See Comments)    Reaction as a child, patient does not recall  Has patient had a PCN reaction causing immediate rash, facial/tongue/throat swelling, SOB or lightheadedness with hypotension: unknown Has patient had a PCN reaction causing severe rash involving mucus membranes or skin necrosis: unknown Has patient had a PCN reaction that required hospitalization: unknown Has patient had a PCN reaction occurring within the last 10 years: No If all of the above answers are "NO", then may proceed with Cephalosporin use.    Thank you for allowing pharmacy to be a part of this patient's care.  Lotta Frankenfield C, Pharm.D., BCPS Clinical Pharmacist 07/03/2016 9:43 AM

## 2016-07-03 NOTE — Progress Notes (Signed)
[facility] [place] at [facility]   PATIENT NAME: Marilyn Andrews    MRN#:  [id number]  DATE OF BIRTH:  [birth year]  SUBJECTIVE:  Hospital Day: 2 days Marilyn Andrews is a 80 y.o. female presenting with Abdominal Pain .  Continues to have lower abdominal pain. Worsening vomiting. No improvement in abdominal pain. Afebrile.  REVIEW OF SYSTEMS:  CONSTITUTIONAL: Positive fatigue or weakness.  EYES: No blurred or double vision.  EARS, NOSE, AND THROAT: No tinnitus or ear pain.  RESPIRATORY: No cough, shortness of breath, wheezing or hemoptysis.  CARDIOVASCULAR: No chest pain, orthopnea, edema.  GASTROINTESTINAL: Positive nausea, vomiting,  abdominal pain. Denies diarrhea constipation GENITOURINARY: No dysuria, hematuria.  ENDOCRINE: No polyuria, nocturia,  HEMATOLOGY: No anemia, easy bruising or bleeding SKIN: No rash or lesion. MUSCULOSKELETAL: No joint pain or arthritis.   NEUROLOGIC: No tingling, numbness, weakness.  PSYCHIATRY: No anxiety or depression.   DRUG ALLERGIES:   Allergies  Allergen Reactions  . Penicillins Other (See Comments)    Reaction as a child, patient does not recall  Has patient had a PCN reaction causing immediate rash, facial/tongue/throat swelling, SOB or lightheadedness with hypotension: unknown Has patient had a PCN reaction causing severe rash involving mucus membranes or skin necrosis: unknown Has patient had a PCN reaction that required hospitalization: unknown Has patient had a PCN reaction occurring within the last 10 years: No If all of the above answers are "NO", then may proceed with Cephalosporin use.    VITALS:  Blood pressure (!) 150/63, pulse 78, temperature 98.4 F (36.9 C), temperature source Oral, resp. rate 18, height 5' (1.524 m), weight 80.4 kg (177 lb 4.8 oz), SpO2 (!) 89 %.  PHYSICAL EXAMINATION:  VITAL SIGNS: Vitals:   07/03/16 0714 07/03/16 1110  BP: (!) 138/58 (!) 150/63  Pulse: 71 78  Resp: 18    Temp: 98.1 F (36.7 C) 98.4 F (36.9 C)   GENERAL:80 y.o.female currently in no acute distress. Decreased hearing HEAD: Normocephalic, atraumatic.  EYES: Pupils equal, round, reactive to light. Extraocular muscles intact. No scleral icterus.  MOUTH: Moist mucosal membrane. Dentition intact. No abscess noted.  EAR, NOSE, THROAT: Clear without exudates. No external lesions.  NECK: Supple. No thyromegaly. No nodules. No JVD.  PULMONARY: Clear to ascultation, without wheeze rails or rhonci. No use of accessory muscles, Good respiratory effort. good air entry bilaterally CHEST: Nontender to palpation.  CARDIOVASCULAR: S1 and S2. Regular rate and rhythm. No murmurs, rubs, or gallops. No edema. Pedal pulses 2+ bilaterally.  GASTROINTESTINAL: Soft, nondistended. No masses. Positive bowel sounds. No hepatosplenomegaly. Lower abdominal tenderness MUSCULOSKELETAL: No swelling, clubbing, or edema. Range of motion full in all extremities.  NEUROLOGIC: Cranial nerves II through XII are intact. No gross focal neurological deficits. Sensation intact. Reflexes intact.  SKIN: No ulceration, lesions, rashes, or cyanosis. Skin warm and dry. Turgor intact.  PSYCHIATRIC: Mood, affect within normal limits. The patient is awake, alert and oriented x 3. Insight, judgment intact.   LABORATORY PANEL:   CBC  Recent Labs Lab 06/30/16 2220  WBC 8.1  HGB 13.6  HCT 40.0  PLT 307   ------------------------------------------------------------------------------------------------------------------  Chemistries   Recent Labs Lab 06/30/16 2220  NA 140  K 3.8  CL 105  CO2 28  GLUCOSE 100*  BUN 10  CREATININE 0.94  CALCIUM 8.7*  AST 23  ALT 12*  ALKPHOS 73  BILITOT 0.6   ------------------------------------------------------------------------------------------------------------------  Cardiac Enzymes No results for input(s): TROPONINI in the last 168  hours. ------------------------------------------------------------------------------------------------------------------  RADIOLOGY:  No results found.  EKG:   Orders placed or performed during the hospital encounter of 06/30/16  . ED EKG  . ED EKG    ASSESSMENT AND PLAN:   Marilyn Andrews is a 80 y.o. female presenting with Abdominal Pain  Admitted 06/30/2016 : Day #: 2 days   1. Acute diverticulitis, descending colon No improvement Clear liquids. IV Cipro and Flagyl.  Worsening vomiting. Check Abd xray for ileus/SBO.  2. Essential hypertension Norvasc needed hydralazine  3. Venous thromboembolism prophylactic: Lovenox  4. Weakness Consulted physical therapy  All the records are reviewed and case discussed with Care Management/Social Workerr. Management plans discussed with the patient, family and they are in agreement.  CODE STATUS: full   TOTAL TIME TAKING CARE OF THIS PATIENT: 30 minutes.   POSSIBLE D/C IN 2-3 DAYS, DEPENDING ON CLINICAL CONDITION.  Milagros Loll R M.D on 07/03/2016 at 11:13 AM  Between 7am to 6pm - Pager - [phone]  After 6pm: House Pager: - [phone]  [facility]  Office  [phone]  CC: Primary care physician; [facility]

## 2016-07-03 NOTE — Care Management Note (Signed)
Case Management Note  Patient Details  Name: Marilyn Kidneyeggy M Andrews MRN: [id number] Date of Birth: [birth year]  Subjective/Objective:     Spoke with patient for continued assessment. Discussed Home Health with patient who is agreeable to this service. Reviewed agencies and patient would like to go with [facility]. Referral placed with Abby at [facility].                 Action/Plan: Plan at this time is Home with Home Health    Expected Discharge Date:                  Expected Discharge Plan:  Home w Home Health Services  In-House Referral:     Discharge planning Services  CM Consult  Post Acute Care Choice:    Choice offered to:     DME Arranged:    DME Agency:     HH Arranged:    HH Agency:     Status of Service:  In process, will continue to follow  If discussed at Long Length of Stay Meetings, dates discussed:    Additional Comments:  Adonis HugueninBerkhead, Jamil Armwood L, RN 07/03/2016, 2:09 PM

## 2016-07-04 LAB — CBC WITH DIFFERENTIAL/PLATELET
Basophils Absolute: 0 10*3/uL (ref 0–0.1)
Basophils Relative: 1 %
Eosinophils Absolute: 0.3 10*3/uL (ref 0–0.7)
Eosinophils Relative: 4 %
HCT: 39.6 % (ref 35.0–47.0)
Hemoglobin: 13.4 g/dL (ref 12.0–16.0)
Lymphocytes Relative: 16 %
Lymphs Abs: 1 10*3/uL (ref 1.0–3.6)
MCH: 31.5 pg (ref 26.0–34.0)
MCHC: 33.8 g/dL (ref 32.0–36.0)
MCV: 93.2 fL (ref 80.0–100.0)
Monocytes Absolute: 0.6 10*3/uL (ref 0.2–0.9)
Monocytes Relative: 9 %
Neutro Abs: 4.5 10*3/uL (ref 1.4–6.5)
Neutrophils Relative %: 70 %
Platelets: 289 10*3/uL (ref 150–440)
RBC: 4.24 MIL/uL (ref 3.80–5.20)
RDW: 13.3 % (ref 11.5–14.5)
WBC: 6.4 10*3/uL (ref 3.6–11.0)

## 2016-07-04 LAB — URINALYSIS COMPLETE WITH MICROSCOPIC (ARMC ONLY)
Bilirubin Urine: NEGATIVE
Glucose, UA: NEGATIVE mg/dL
Hgb urine dipstick: NEGATIVE
Nitrite: NEGATIVE
Protein, ur: NEGATIVE mg/dL
Specific Gravity, Urine: 1.005 (ref 1.005–1.030)
pH: 6 (ref 5.0–8.0)

## 2016-07-04 LAB — BASIC METABOLIC PANEL
Anion gap: 4 — ABNORMAL LOW (ref 5–15)
BUN: 7 mg/dL (ref 6–20)
CO2: 27 mmol/L (ref 22–32)
Calcium: 8 mg/dL — ABNORMAL LOW (ref 8.9–10.3)
Chloride: 108 mmol/L (ref 101–111)
Creatinine, Ser: 0.86 mg/dL (ref 0.44–1.00)
GFR calc Af Amer: >60 mL/min (ref >60)
GFR calc non Af Amer: >60 mL/min (ref >60)
Glucose, Bld: 92 mg/dL (ref 65–99)
Potassium: 3.3 mmol/L — ABNORMAL LOW (ref 3.5–5.1)
Sodium: 139 mmol/L (ref 135–145)

## 2016-07-04 MED ORDER — POLYETHYLENE GLYCOL 3350 17 G PO PACK
17.0000 g | PACK | Freq: Every day | ORAL | Status: AC
  Administered 2016-07-04 – 2016-07-06 (×2): 17 g via ORAL
  Filled 2016-07-04 (×3): qty 1

## 2016-07-04 MED ORDER — POTASSIUM CHLORIDE 10 MEQ/100ML IV SOLN
10.0000 meq | Freq: Once | INTRAVENOUS | Status: AC
  Administered 2016-07-04: 10 meq via INTRAVENOUS
  Filled 2016-07-04: qty 100

## 2016-07-04 MED ORDER — POTASSIUM CHLORIDE CRYS ER 20 MEQ PO TBCR
40.0000 meq | EXTENDED_RELEASE_TABLET | ORAL | Status: DC
  Administered 2016-07-04: 40 meq via ORAL

## 2016-07-04 NOTE — Progress Notes (Signed)
[facility] [place] at [facility]   PATIENT NAME: Marilyn Andrews    MRN#:  [id number]  DATE OF BIRTH:  June 08, 1936  SUBJECTIVE:  Hospital Day: 3 days Marilyn Andrews is a 80 y.o. female presenting with Abdominal Pain .  Continues to have lower abdominal pain. Some vomiting. Afebrile.  REVIEW OF SYSTEMS:  CONSTITUTIONAL: Positive fatigue or weakness.  EYES: No blurred or double vision.  EARS, NOSE, AND THROAT: No tinnitus or ear pain.  RESPIRATORY: No cough, shortness of breath, wheezing or hemoptysis.  CARDIOVASCULAR: No chest pain, orthopnea, edema.  GASTROINTESTINAL: Positive nausea, vomiting,  abdominal pain. Denies diarrhea constipation GENITOURINARY: No dysuria, hematuria.  ENDOCRINE: No polyuria, nocturia,  HEMATOLOGY: No anemia, easy bruising or bleeding SKIN: No rash or lesion. MUSCULOSKELETAL: No joint pain or arthritis.   NEUROLOGIC: No tingling, numbness, weakness.  PSYCHIATRY: No anxiety or depression.   DRUG ALLERGIES:   Allergies  Allergen Reactions  . Penicillins Other (See Comments)    Reaction as a child, patient does not recall  Has patient had a PCN reaction causing immediate rash, facial/tongue/throat swelling, SOB or lightheadedness with hypotension: unknown Has patient had a PCN reaction causing severe rash involving mucus membranes or skin necrosis: unknown Has patient had a PCN reaction that required hospitalization: unknown Has patient had a PCN reaction occurring within the last 10 years: No If all of the above answers are "NO", then may proceed with Cephalosporin use.    VITALS:  Blood pressure (!) 168/63, pulse 88, temperature 98.3 F (36.8 C), temperature source Oral, resp. rate 18, height 5' (1.524 m), weight 80.9 kg (178 lb 4.8 oz), SpO2 95 %.  PHYSICAL EXAMINATION:  VITAL SIGNS: Vitals:   07/04/16 0814 07/04/16 0815  BP: (!) 168/63 (!) 168/63  Pulse:  88  Resp:  18  Temp:  98.3 F (36.8 C)   GENERAL:80  y.o.female currently in no acute distress. Decreased hearing HEAD: Normocephalic, atraumatic.  EYES: Pupils equal, round, reactive to light. Extraocular muscles intact. No scleral icterus.  MOUTH: Moist mucosal membrane. Dentition intact. No abscess noted.  EAR, NOSE, THROAT: Clear without exudates. No external lesions.  NECK: Supple. No thyromegaly. No nodules. No JVD.  PULMONARY: Clear to ascultation, without wheeze rails or rhonci. No use of accessory muscles, Good respiratory effort. good air entry bilaterally CHEST: Nontender to palpation.  CARDIOVASCULAR: S1 and S2. Regular rate and rhythm. No murmurs, rubs, or gallops. No edema. Pedal pulses 2+ bilaterally.  GASTROINTESTINAL: Soft, nondistended. No masses. Positive bowel sounds. No hepatosplenomegaly. Lower abdominal tenderness MUSCULOSKELETAL: No swelling, clubbing, or edema. Range of motion full in all extremities.  NEUROLOGIC: Cranial nerves II through XII are intact. No gross focal neurological deficits. Sensation intact. Reflexes intact.  SKIN: No ulceration, lesions, rashes, or cyanosis. Skin warm and dry. Turgor intact.  PSYCHIATRIC: Mood, affect within normal limits. The patient is awake, alert and oriented x 3. Insight, judgment intact.   LABORATORY PANEL:   CBC  Recent Labs Lab 07/04/16 0408  WBC 6.4  HGB 13.4  HCT 39.6  PLT 289   ------------------------------------------------------------------------------------------------------------------  Chemistries   Recent Labs Lab 06/30/16 2220 07/04/16 0408  NA 140 139  K 3.8 3.3*  CL 105 108  CO2 28 27  GLUCOSE 100* 92  BUN 10 7  CREATININE 0.94 0.86  CALCIUM 8.7* 8.0*  AST 23  --   ALT 12*  --   ALKPHOS 73  --   BILITOT 0.6  --    ------------------------------------------------------------------------------------------------------------------  Cardiac Enzymes No results for input(s): TROPONINI in the last 168  hours. ------------------------------------------------------------------------------------------------------------------  RADIOLOGY:  Dg Abd 2 Views  Result Date: 07/03/2016 CLINICAL DATA:  Abdominal pain and vomiting. History of hypertension and previous appendectomy, hysterectomy, and salpingectomy. EXAM: ABDOMEN - 2 VIEW COMPARISON:  Scout radiograph from an abdominal CT scan of July 01, 2016 FINDINGS: There is contrast within the mid and distal portion of the colon. There are few loops of gas-filled small bowel which exhibit minimal distention. No free extraluminal gas collections are observed. There are no abnormal soft tissue calcifications. The bony structures exhibit no acute abnormality. IMPRESSION: Distal passage of the orally administered contrast has occurred. There is no evidence of obstruction or perforation. Electronically Signed   By: David  [place] M.D.   On: 07/03/2016 15:14    EKG:   Orders placed or performed during the hospital encounter of 06/30/16  . ED EKG  . ED EKG    ASSESSMENT AND PLAN:   Marilyn Andrews is a 80 y.o. female presenting with Abdominal Pain  Admitted 06/30/2016 : Day #: 3 days   1. Acute diverticulitis, descending colon Minimal improvement. Afebrile. Normal WBC. No acute abdomen Clear liquids. IV Cipro and Flagyl. Xray showed no obstruction or ileus  2. Essential hypertension Norvasc As needed hydralazine  3. Venous thromboembolism prophylactic: Lovenox  4. Weakness Consulted physical therapy  All the records are reviewed and case discussed with Care Management/Social Workerr. Management plans discussed with the patient, family and they are in agreement.  CODE STATUS: full   TOTAL TIME TAKING CARE OF THIS PATIENT: 30 minutes.   POSSIBLE D/C IN 2-3 DAYS, DEPENDING ON CLINICAL CONDITION.  Milagros Loll R M.D on 07/04/2016 at 11:53 AM  Between 7am to 6pm - Pager - [phone]  After 6pm: House Pager: - [phone]  Fabio Neighbors  Hospitalists  Office  [phone]  CC: Primary care physician; [facility]

## 2016-07-04 NOTE — Progress Notes (Signed)
Patient is refusing to eat, get up to the chair, and bed alarm. MD notified.  Harvie HeckMelanie Kellsey Sansone, RN

## 2016-07-04 NOTE — Plan of Care (Signed)
Problem: Activity: Goal: Risk for activity intolerance will decrease Outcome: Not Progressing Patient is refusing to ambulate and to sit up in her recliner.  Harvie HeckMelanie Edwyn Inclan, RN

## 2016-07-05 ENCOUNTER — Inpatient Hospital Stay: Payer: Medicare Other

## 2016-07-05 LAB — URINE CULTURE: Culture: <10000 — AB

## 2016-07-05 MED ORDER — FLUTICASONE PROPIONATE 50 MCG/ACT NA SUSP
1.0000 | Freq: Every day | NASAL | Status: DC
  Administered 2016-07-06 – 2016-07-07 (×2): 1 via NASAL
  Filled 2016-07-05: qty 16

## 2016-07-05 MED ORDER — IOPAMIDOL (ISOVUE-300) INJECTION 61%
15.0000 mL | INTRAVENOUS | Status: AC
  Administered 2016-07-05 (×2): 15 mL via ORAL

## 2016-07-05 MED ORDER — IOPAMIDOL (ISOVUE-300) INJECTION 61%
100.0000 mL | Freq: Once | INTRAVENOUS | Status: AC | PRN
  Administered 2016-07-05: 100 mL via INTRAVENOUS

## 2016-07-05 MED ORDER — FUROSEMIDE 40 MG PO TABS
40.0000 mg | ORAL_TABLET | Freq: Once | ORAL | Status: AC
  Administered 2016-07-05: 40 mg via ORAL
  Filled 2016-07-05: qty 1

## 2016-07-05 NOTE — Progress Notes (Signed)
Patient had complaints of lower abdominal pain and nausea today. Admin phenergan and morphine offered some relief.

## 2016-07-05 NOTE — Progress Notes (Signed)
[facility] - [place] at [facility]   PATIENT NAME: Marilyn Andrews    MRN#:  [id number]  DATE OF BIRTH:  1935/12/12  SUBJECTIVE:  Hospital Day: 4 days Marilyn Andrews is a 80 y.o. female presenting with Abdominal Pain   lower abdominal pain. Some vomiting.mostly nausea Afebrile.  Sinus congestion  REVIEW OF SYSTEMS:  CONSTITUTIONAL: Positive fatigue or weakness.  EYES: No blurred or double vision.  EARS, NOSE, AND THROAT: No tinnitus or ear pain.  RESPIRATORY: No cough, shortness of breath, wheezing or hemoptysis.  CARDIOVASCULAR: No chest pain, orthopnea, edema.  GASTROINTESTINAL: Positive nausea, vomiting,  abdominal pain. Denies diarrhea constipation GENITOURINARY: No dysuria, hematuria.  ENDOCRINE: No polyuria, nocturia,  HEMATOLOGY: No anemia, easy bruising or bleeding SKIN: No rash or lesion. MUSCULOSKELETAL: No joint pain or arthritis.   NEUROLOGIC: No tingling, numbness, weakness.  PSYCHIATRY: No anxiety or depression.   DRUG ALLERGIES:   Allergies  Allergen Reactions  . Penicillins Other (See Comments)    Reaction as a child, patient does not recall  Has patient had a PCN reaction causing immediate rash, facial/tongue/throat swelling, SOB or lightheadedness with hypotension: unknown Has patient had a PCN reaction causing severe rash involving mucus membranes or skin necrosis: unknown Has patient had a PCN reaction that required hospitalization: unknown Has patient had a PCN reaction occurring within the last 10 years: No If all of the above answers are "NO", then may proceed with Cephalosporin use.    VITALS:  Blood pressure (!) 140/53, pulse 62, temperature 98.5 F (36.9 C), temperature source Oral, resp. rate 19, height 5' (1.524 m), weight 83.5 kg (184 lb), SpO2 95 %.  PHYSICAL EXAMINATION:  VITAL SIGNS: Vitals:   07/05/16 0340 07/05/16 0740  BP: (!) 145/59 (!) 140/53  Pulse: 66 62  Resp: 20 19  Temp: 98.4 F (36.9 C) 98.5 F (36.9  C)   GENERAL:80 y.o.female currently in no acute distress. Decreased hearing HEAD: Normocephalic, atraumatic.  EYES: Pupils equal, round, reactive to light. Extraocular muscles intact. No scleral icterus.  MOUTH: Moist mucosal membrane. Dentition intact. No abscess noted.  EAR, NOSE, THROAT: Clear without exudates. No external lesions.  NECK: Supple. No thyromegaly. No nodules. No JVD.  PULMONARY: Clear to ascultation, without wheeze rails or rhonci. No use of accessory muscles, Good respiratory effort. good air entry bilaterally CHEST: Nontender to palpation.  CARDIOVASCULAR: S1 and S2. Regular rate and rhythm. No murmurs, rubs, or gallops. No edema. Pedal pulses 2+ bilaterally.  GASTROINTESTINAL: Soft, nondistended. No masses. Positive bowel sounds. No hepatosplenomegaly. Lower abdominal tenderness MUSCULOSKELETAL: No swelling, clubbing, or edema. Range of motion full in all extremities.  NEUROLOGIC: Cranial nerves II through XII are intact. No gross focal neurological deficits. Sensation intact. Reflexes intact.  SKIN: No ulceration, lesions, rashes, or cyanosis. Skin warm and dry. Turgor intact.  PSYCHIATRIC: Mood, affect within normal limits. The patient is awake, alert and oriented x 3. Insight, judgment intact.   LABORATORY PANEL:   CBC  Recent Labs Lab 07/04/16 0408  WBC 6.4  HGB 13.4  HCT 39.6  PLT 289   ------------------------------------------------------------------------------------------------------------------  Chemistries   Recent Labs Lab 06/30/16 2220 07/04/16 0408  NA 140 139  K 3.8 3.3*  CL 105 108  CO2 28 27  GLUCOSE 100* 92  BUN 10 7  CREATININE 0.94 0.86  CALCIUM 8.7* 8.0*  AST 23  --   ALT 12*  --   ALKPHOS 73  --   BILITOT 0.6  --    ------------------------------------------------------------------------------------------------------------------  Cardiac Enzymes No results for input(s): TROPONINI in the last 168  hours. ------------------------------------------------------------------------------------------------------------------  RADIOLOGY:  Dg Abd 2 Views  Result Date: 07/03/2016 CLINICAL DATA:  Abdominal pain and vomiting. History of hypertension and previous appendectomy, hysterectomy, and salpingectomy. EXAM: ABDOMEN - 2 VIEW COMPARISON:  Scout radiograph from an abdominal CT scan of July 01, 2016 FINDINGS: There is contrast within the mid and distal portion of the colon. There are few loops of gas-filled small bowel which exhibit minimal distention. No free extraluminal gas collections are observed. There are no abnormal soft tissue calcifications. The bony structures exhibit no acute abnormality. IMPRESSION: Distal passage of the orally administered contrast has occurred. There is no evidence of obstruction or perforation. Electronically Signed   By: David  SwazilandJordan M.D.   On: 07/03/2016 15:14    EKG:   Orders placed or performed during the hospital encounter of 06/30/16  . ED EKG  . ED EKG    ASSESSMENT AND PLAN:   Marilyn Andrews is a 80 y.o. female presenting with Abdominal Pain  Admitted 06/30/2016 : Day #: 4 days   1. Acute diverticulitis, descending colon - Continues to have significant pain Afebrile. Normal WBC. Clear liquids. IV Cipro and Flagyl. Xray showed no obstruction or ileus  Repeat CT abd today as pain is not improving  2. Essential hypertension Norvasc As needed hydralazine  3. Venous thromboembolism prophylactic: Lovenox  4. Weakness Consulted physical therapy  All the records are reviewed and case discussed with Care Management/Social Workerr. Management plans discussed with the patient, family and they are in agreement.  CODE STATUS: full   TOTAL TIME TAKING CARE OF THIS PATIENT: 30 minutes.   POSSIBLE D/C IN 2-3 DAYS, DEPENDING ON CLINICAL CONDITION.  Milagros LollSudini, Ocean Schildt R M.D on 07/05/2016 at 11:20 AM  Between 7am to 6pm - Pager - [phone]  After 6pm:  House Pager: [phone]  [facility]  Office  [phone]  CC: Primary care physician; [facility]

## 2016-07-06 MED ORDER — OXYCODONE HCL 5 MG PO TABS
5.0000 mg | ORAL_TABLET | ORAL | Status: DC | PRN
  Administered 2016-07-06 – 2016-07-07 (×4): 5 mg via ORAL
  Filled 2016-07-06 (×4): qty 1

## 2016-07-06 NOTE — Progress Notes (Signed)
[facility] [place] [facility]   PATIENT NAME: Marilyn Andrews    MRN#:  [id number]  DATE OF BIRTH:  Dec 29, 1935  SUBJECTIVE:  Hospital Day: 5 days Marilyn Andrews is a 80 y.o. female presenting with Abdominal Pain  Continues to have lower abdominal pain and nausea. Patient mentions she has had these symptoms for many months. Now worse with diverticulitis. Slowly improving. Afebrile. Tolerating clear liquids but poor appetite.  Has mild sore throat with difficulty swallowing.  REVIEW OF SYSTEMS:  CONSTITUTIONAL: Positive fatigue or weakness.  EYES: No blurred or double vision.  EARS, NOSE, AND THROAT: No tinnitus or ear pain.  RESPIRATORY: No cough, shortness of breath, wheezing or hemoptysis.  CARDIOVASCULAR: No chest pain, orthopnea, edema.  GASTROINTESTINAL: Positive nausea, vomiting,  abdominal pain. Denies diarrhea constipation GENITOURINARY: No dysuria, hematuria.  ENDOCRINE: No polyuria, nocturia,  HEMATOLOGY: No anemia, easy bruising or bleeding SKIN: No rash or lesion. MUSCULOSKELETAL: No joint pain or arthritis.   NEUROLOGIC: No tingling, numbness, weakness.  PSYCHIATRY: No anxiety or depression.   DRUG ALLERGIES:   Allergies  Allergen Reactions  . Penicillins Other (See Comments)    Reaction as a child, patient does not recall  Has patient had a PCN reaction causing immediate rash, facial/tongue/throat swelling, SOB or lightheadedness with hypotension: unknown Has patient had a PCN reaction causing severe rash involving mucus membranes or skin necrosis: unknown Has patient had a PCN reaction that required hospitalization: unknown Has patient had a PCN reaction occurring within the last 10 years: No If all of the above answers are "NO", then may proceed with Cephalosporin use.    VITALS:  Blood pressure (!) 141/57, pulse 65, temperature 98.3 F (36.8 C), temperature source Oral, resp. rate 18, height 5' (1.524 m), weight 81 kg (178 lb 7.5  oz), SpO2 97 %.  PHYSICAL EXAMINATION:  VITAL SIGNS: Vitals:   07/06/16 0359 07/06/16 0750  BP: (!) 150/56 (!) 141/57  Pulse: 65 65  Resp: 16 18  Temp: 97.4 F (36.3 C) 98.3 F (36.8 C)   GENERAL:80 y.o.female currently in no acute distress. Decreased hearing HEAD: Normocephalic, atraumatic.  EYES: Pupils equal, round, reactive to light. Extraocular muscles intact. No scleral icterus.  MOUTH: Moist mucosal membrane. Dentition intact. No abscess noted.  EAR, NOSE, THROAT: Clear without exudates. No external lesions.  NECK: Supple. No thyromegaly. No nodules. No JVD.  PULMONARY: Clear to ascultation, without wheeze rails or rhonci. No use of accessory muscles, Good respiratory effort. good air entry bilaterally CHEST: Nontender to palpation.  CARDIOVASCULAR: S1 and S2. Regular rate and rhythm. No murmurs, rubs, or gallops. No edema. Pedal pulses 2+ bilaterally.  GASTROINTESTINAL: Soft, nondistended. No masses. Positive bowel sounds. No hepatosplenomegaly. Lower abdominal tenderness MUSCULOSKELETAL: No swelling, clubbing, or edema. Range of motion full in all extremities.  NEUROLOGIC: Cranial nerves II through XII are intact. No gross focal neurological deficits. Sensation intact. Reflexes intact.  SKIN: No ulceration, lesions, rashes, or cyanosis. Skin warm and dry. Turgor intact.  PSYCHIATRIC: Mood, affect within normal limits. The patient is awake, alert and oriented x 3. Insight, judgment intact.   LABORATORY PANEL:   CBC  Recent Labs Lab 07/04/16 0408  WBC 6.4  HGB 13.4  HCT 39.6  PLT 289   ------------------------------------------------------------------------------------------------------------------  Chemistries   Recent Labs Lab 06/30/16 2220 07/04/16 0408  NA 140 139  K 3.8 3.3*  CL 105 108  CO2 28 27  GLUCOSE 100* 92  BUN 10 7  CREATININE 0.94  0.86  CALCIUM 8.7* 8.0*  AST 23  --   ALT 12*  --   ALKPHOS 73  --   BILITOT 0.6  --     ------------------------------------------------------------------------------------------------------------------  Cardiac Enzymes No results for input(s): TROPONINI in the last 168 hours. ------------------------------------------------------------------------------------------------------------------  RADIOLOGY:  Ct Abdomen Pelvis W Contrast  Result Date: 07/05/2016 CLINICAL DATA:  Lower abdominal pain x4 days EXAM: CT ABDOMEN AND PELVIS WITH CONTRAST TECHNIQUE: Multidetector CT imaging of the abdomen and pelvis was performed using the standard protocol following bolus administration of intravenous contrast. CONTRAST:  ISOVUE-300 IOPAMIDOL (ISOVUE-300) INJECTION 61% COMPARISON:  07/01/2016 FINDINGS: Lower chest: Mild linear scarring/ atelectasis in the bilateral lower lobes. Hepatobiliary: Liver is within normal limits. Status post cholecystectomy. No intrahepatic ductal dilatation. Common duct measures 14 mm but smoothly tapers at the ampulla. Pancreas: Within normal limits. Spleen: Within normal limits. Adrenals/Urinary Tract: Adrenal glands are within normal limits. Kidneys are within normal limits.  No renal calculi. Mild fullness of the bilateral renal collecting systems. Bladder is within normal limits. Stomach/Bowel: Stomach is within normal limits. Prior appendectomy. Extensive colonic diverticulosis. Associated acute diverticulitis involving the distal descending colon is less conspicuous on the current CT. No drainable fluid collection/ abscess.  No free air. Vascular/Lymphatic: Atherosclerotic calcifications of the abdominal aorta and branch vessels. No evidence of abdominal aortic aneurysm. No suspicious abdominopelvic lymphadenopathy. Reproductive: Status post hysterectomy. Bilateral ovaries are unremarkable. Other: No abdominopelvic ascites. Musculoskeletal: Mild degenerative changes of the visualized thoracolumbar spine. IMPRESSION: Extensive colonic diverticulosis. Associated  acute diverticulitis involving the distal descending colon is less conspicuous on the current CT. No drainable fluid collection/ abscess.  No free air. Additional ancillary findings as above. Electronically Signed   By: Charline Bills M.D.   On: 07/05/2016 13:25    EKG:   Orders placed or performed during the hospital encounter of 06/30/16  . ED EKG  . ED EKG    ASSESSMENT AND PLAN:   Marilyn Andrews is a 80 y.o. female presenting with Abdominal Pain  Admitted 06/30/2016 : Day #: 5 days   1. Acute diverticulitis, descending colon - Continues to have significant pain Afebrile. Normal WBC. Clear liquids. Advance to soft diet today IV Cipro and Flagyl. Xray showed no obstruction or ileus Repeat CT abd 07/05/2016 showed improving diverticulitis. No abcess/perforation.  2. Essential hypertension Norvasc As needed hydralazine  3. Venous thromboembolism prophylactic: Lovenox  4. Weakness Consulted physical therapy. Patient did not participate earlier.   5. Dysphagia Likely due to vomiting. Will need outpatient GI follow-up for diverticulitis and mild dysphagia.  All the records are reviewed and case discussed with Care Management/Social Workerr. Management plans discussed with the patient, family and they are in agreement.  CODE STATUS: full   TOTAL TIME TAKING CARE OF THIS PATIENT: 30 minutes.   Likely discharge tomorrow with oral antibiotics.  Milagros Loll R M.D on 07/06/2016 at 12:03 PM  Between 7am to 6pm - Pager - [phone]  After 6pm: House Pager: - [phone]  [facility]io Ne[facility]ors [facility]  Office  [phone]  CC: Primary care physician; [facility]

## 2016-07-06 NOTE — Care Management (Addendum)
Continues with IV ABX, poor po intake. plan is for discharge tomorrow after transition to Oral ABX. Home Health with [facility]

## 2016-07-07 MED ORDER — POTASSIUM CHLORIDE CRYS ER 20 MEQ PO TBCR: 20.0000 meq | EXTENDED_RELEASE_TABLET | Freq: Every day | ORAL | 0 refills | Status: DC

## 2016-07-07 MED ORDER — ONDANSETRON 4 MG PO TBDP: 4.0000 mg | ORAL_TABLET | Freq: Four times a day (QID) | ORAL | 0 refills | Status: DC | PRN

## 2016-07-07 MED ORDER — CIPROFLOXACIN HCL 500 MG PO TABS: 500.0000 mg | ORAL_TABLET | Freq: Two times a day (BID) | ORAL | 0 refills | Status: DC

## 2016-07-07 MED ORDER — METRONIDAZOLE 250 MG PO TABS: 250.0000 mg | ORAL_TABLET | Freq: Three times a day (TID) | ORAL | 0 refills | Status: DC

## 2016-07-07 MED ORDER — POTASSIUM CHLORIDE CRYS ER 20 MEQ PO TBCR
20.0000 meq | EXTENDED_RELEASE_TABLET | Freq: Two times a day (BID) | ORAL | Status: DC
  Administered 2016-07-07: 20 meq via ORAL
  Filled 2016-07-07: qty 1

## 2016-07-07 NOTE — Care Management Important Message (Signed)
Important Message  Patient Details  Name: Marilyn Andrews MRN: [id number] Date of Birth: Jul 18, 1936   Medicare Important Message Given:  Yes    Eber HongGreene, Ludger Bones R, RN 07/07/2016, 10:31 AM

## 2016-07-07 NOTE — Progress Notes (Signed)
Physical Therapy Treatment Patient Details Name: Marilyn Andrews MRN: [id number] DOB: Sep 13, 1936 Today's Date: 07/07/2016    History of Present Illness Pt is a 80 y.o. female presenting to hospital with L lower quadrant abdominal pain.  Pt admitted with acute diverticulitis of descending colon.  PMH includes htn and anxiety.    PT Comments    Pt demonstrates decreased activity tolerance and requiring increased assist compared to initial eval last week.  Pt ambulated 10 feet with RW with intermittent loss of balance requiring assist to steady; limited distance d/t pt c/o fatigue and feeling "woozy".   Pt appears to have performed minimal activity over the weekend. D/t increased assist levels and impairments noted above, recommend pt discharge to STR (CM and SW notified).   Follow Up Recommendations  SNF     Equipment Recommendations  Other (comment) (pt already owns RW)    Recommendations for Other Services       Precautions / Restrictions Precautions Precautions: Fall Restrictions Weight Bearing Restrictions: No    Mobility  Bed Mobility Overal bed mobility: Needs Assistance Bed Mobility: Supine to Sit     Supine to sit: Supervision;HOB elevated     General bed mobility comments: significant increased time to perform and heavy use of bedrail noted  Transfers Overall transfer level: Needs assistance Equipment used: Rolling walker (2 wheeled) Transfers: Sit to/from Stand Sit to Stand: Mod assist         General transfer comment: assist to initiate stand and come to full upright position  Ambulation/Gait Ambulation/Gait assistance: Min guard;Min assist Ambulation Distance (Feet): 10 Feet Assistive device: Rolling walker (2 wheeled)   Gait velocity: decreased   General Gait Details: significant decreased B step length/foot clearance/heelstrike; intermittent loss of balance requiring assist to steady; limited distance d/t fatigue and feeling "woozy"   Stairs             Wheelchair Mobility    Modified Rankin (Stroke Patients Only)       Balance Overall balance assessment: Needs assistance Sitting-balance support: Bilateral upper extremity supported;Feet supported Sitting balance-Leahy Scale: Good     Standing balance support: Bilateral upper extremity supported (on RW) Standing balance-Leahy Scale: Fair Standing balance comment: intermittent loss of balance with ambulation                    Cognition Arousal/Alertness: Awake/alert Behavior During Therapy: WFL for tasks assessed/performed Overall Cognitive Status: Within Functional Limits for tasks assessed                      Exercises General Exercises - Lower Extremity Ankle Circles/Pumps: AROM;Strengthening;Both;10 reps;Seated Long Arc Quad: AROM;Strengthening;Both;10 reps;Seated (gentle resistance for flexion and extension range) Hip ABduction/ADduction: AROM;Strengthening;Both;10 reps;Seated (Hip ADduction isometrics x1 second holds with pillow between pt's knees) Hip Flexion/Marching: AROM;Strengthening;Both;10 reps;Seated  Pt c/o R ADductor pain/spasms with ex's (pt repositioned in chair for comfort).    General Comments   Nursing cleared pt for participation in physical therapy.  Pt agreeable to PT session.      Pertinent Vitals/Pain Pain Assessment: 0-10 Pain Score: 7  Pain Location: abdominal pain Pain Descriptors / Indicators: Aching;Tender Pain Intervention(s): Limited activity within patient's tolerance;Monitored during session;Repositioned (Nursing notified)    Home Living                      Prior Function            PT Goals (current goals can now be  found in the care plan section) Progress towards PT goals: PT to reassess next treatment    Frequency  Min 2X/week    PT Plan Discharge plan needs to be updated    Co-evaluation             End of Session Equipment Utilized During Treatment: Gait belt Activity  Tolerance: Patient limited by fatigue Patient left: in chair;with call bell/phone within reach;with chair alarm set     Time: 5329-9242 PT Time Calculation (min) (ACUTE ONLY): 28 min  Charges:  $Therapeutic Exercise: 8-22 mins $Therapeutic Activity: 8-22 mins                    G CodesHendricks Limes Jul 12, 2016, 11:27 AM Hendricks Limes, PT [phone]

## 2016-07-07 NOTE — Clinical Social Work Note (Signed)
Clinical Social Work Assessment  Patient Details  Name: Marilyn Andrews MRN: [id number] Date of Birth: 03/15/1936  Date of referral:  07/07/16               Reason for consult:  Facility Placement                Permission sought to share information with:  Chartered certified accountant granted to share information::  Yes, Verbal Permission Granted  Name::      Marilyn Andrews::   [place]   Relationship::     Contact Information:     Housing/Transportation Living arrangements for the past 2 months:  [place] of Information:  Patient, Adult Children Patient Interpreter Needed:  None Criminal Activity/Legal Involvement Pertinent to Current Situation/Hospitalization:  No - Comment as needed Significant Relationships:  Adult Children Lives with:  Siblings Do you feel safe going back to the place where you live?  Yes Need for family participation in patient care:  Yes (Comment)  Care giving concerns:  Patient lives in [place] with her sister.    Social Worker assessment / plan:  Holiday representative (CSW) received verbal consult from PT that the recommendation has changed from home health to SNF. CSW met with patient to discuss D/C plan. Patient was alert and oriented and is very hard of hearing. CSW introduced self and explained role of CSW department. Patient reported that she lives in [place] with her sister and is agreeable to SNF search. CSW presented bed offers. Patient chose [facility].   Patient is medically stable for D/C to [facility] today. Per Seth Bake admissions coordinator at [facility] patient will go to room 226. RN will call report at [phone] and arrange EMS for transport. CSW sent D/C orders to [facility] via [place]. Patient is aware of above. CSW contacted patient's son Marilyn Andrews and made him aware of above. Please reconsult if future social work needs arise. CSW signing off.   Employment status:   Retired Forensic scientist:  Medicare PT Recommendations:  [place] / Referral to community resources:  [place]  Patient/Family's Response to care:  Patient is agreeable to go to [facility] today.   Patient/Family's Understanding of and Emotional Response to Diagnosis, Current Treatment, and Prognosis:  Patient and son were pleasant and thanked CSW for assistance.   Emotional Assessment Appearance:  Appears stated age Attitude/Demeanor/Rapport:    Affect (typically observed):  Accepting, Adaptable, Pleasant Orientation:  Oriented to Self, Oriented to Place, Oriented to  Time, Oriented to Situation Alcohol / Substance use:  Not Applicable Psych involvement (Current and /or in the community):  No (Comment)  Discharge Needs  Concerns to be addressed:  Discharge Planning Concerns Readmission within the last 30 days:  No Current discharge risk:  None Barriers to Discharge:  No Barriers Identified   Vickee Mormino, Veronia Beets, LCSW 07/07/2016, 1:29 PM

## 2016-07-07 NOTE — Discharge Summary (Signed)
[facility] [place] [facility]   PATIENT NAME: Marilyn Andrews    MR#:  [id number]  DATE OF BIRTH:  16-Jan-1936  DATE OF ADMISSION:  06/30/2016 ADMITTING PHYSICIAN: Arnaldo Natal, MD  DATE OF DISCHARGE: 07/07/2016  PRIMARY CARE PHYSICIAN: [facility]    ADMISSION DIAGNOSIS:  abd pain  DISCHARGE DIAGNOSIS:  Active Problems:   Diverticulitis   SECONDARY DIAGNOSIS:   Past Medical History:  Diagnosis Date  . Anxiety   . Hypertension     HOSPITAL COURSE:   1. Acute diverticulitis, descending colon - Continues to have significant pain Afebrile. Normal WBC. Clear liquids. Advanced to soft diet IV Cipro and Flagyl. Xray showed no obstruction or ileus Repeat CT abd 07/05/2016 showed improving diverticulitis. No abcess/perforation.  pt felt better- d/c with 7 days of oral Abx and follow up in Gi clinic.  2. Essential hypertension Norvasc As needed hydralazine   Stable.  3. Venous thromboembolism prophylactic: Lovenox  4. Weakness Consulted physical therapy. Patient did not participate earlier.   later PT suggested SNF placement.  5. Dysphagia Likely due to vomiting. Will need outpatient GI follow-up for diverticulitis and mild dysphagia.  no difficulty in swellowing food now.   DISCHARGE CONDITIONS:   Stable.  CONSULTS OBTAINED:  Treatment Team:  Altamese Dilling, MD  DRUG ALLERGIES:   Allergies  Allergen Reactions  . Penicillins Other (See Comments)    Reaction as a child, patient does not recall  Has patient had a PCN reaction causing immediate rash, facial/tongue/throat swelling, SOB or lightheadedness with hypotension: unknown Has patient had a PCN reaction causing severe rash involving mucus membranes or skin necrosis: unknown Has patient had a PCN reaction that required hospitalization: unknown Has patient had a PCN reaction occurring within the last 10 years: No If all of the above answers are  "NO", then may proceed with Cephalosporin use.    DISCHARGE MEDICATIONS:   Current Discharge Medication List    START taking these medications   Details  ciprofloxacin (CIPRO) 500 MG tablet Take 1 tablet (500 mg total) by mouth 2 (two) times daily. Qty: 14 tablet, Refills: 0    metroNIDAZOLE (FLAGYL) 250 MG tablet Take 1 tablet (250 mg total) by mouth 3 (three) times daily. Qty: 21 tablet, Refills: 0    potassium chloride SA (K-DUR,KLOR-CON) 20 MEQ tablet Take 1 tablet (20 mEq total) by mouth daily. Qty: 3 tablet, Refills: 0      CONTINUE these medications which have CHANGED   Details  ondansetron (ZOFRAN ODT) 4 MG disintegrating tablet Take 1 tablet (4 mg total) by mouth every 6 (six) hours as needed for nausea or vomiting. Qty: 20 tablet, Refills: 0      CONTINUE these medications which have NOT CHANGED   Details  acetaminophen (TYLENOL) 325 MG tablet Take 650 mg by mouth every 6 (six) hours as needed for mild pain.     amLODipine (NORVASC) 5 MG tablet Take 5 mg by mouth daily.    escitalopram (LEXAPRO) 20 MG tablet Take 20 mg by mouth daily.    ibuprofen (ADVIL,MOTRIN) 600 MG tablet Take 600 mg by mouth every 8 (eight) hours as needed for moderate pain.    ranitidine (ZANTAC) 150 MG tablet Take 150 mg by mouth daily.    triamcinolone cream (KENALOG) 0.1 % Apply 1 application topically daily as needed (rash on hands).         DISCHARGE INSTRUCTIONS:    Follow with Gi clinic.  If you experience worsening of your admission symptoms, develop shortness of breath, life threatening emergency, suicidal or homicidal thoughts you must seek medical attention immediately by calling 911 or calling your MD immediately  if symptoms less severe.  You Must read complete instructions/literature along with all the possible adverse reactions/side effects for all the Medicines you take and that have been prescribed to you. Take any new Medicines after you have completely understood  and accept all the possible adverse reactions/side effects.   Please note  You were cared for by a hospitalist during your hospital stay. If you have any questions about your discharge medications or the care you received while you were in the hospital after you are discharged, you can call the unit and asked to speak with the hospitalist on call if the hospitalist that took care of you is not available. Once you are discharged, your primary care physician will handle any further medical issues. Please note that NO REFILLS for any discharge medications will be authorized once you are discharged, as it is imperative that you return to your primary care physician (or establish a relationship with a primary care physician if you do not have one) for your aftercare needs so that they can reassess your need for medications and monitor your lab values.    Today   CHIEF COMPLAINT:   Chief Complaint  Patient presents with  . Abdominal Pain    HISTORY OF PRESENT ILLNESS:  Marilyn Andrews  is a 80 y.o. female with a known history of hypertension and diverticulitis presents to the emergency department complaining of abdominal pain. It is located in her left lower quadrant and has been gradually worsening over the last 3-4 days. The patient denies blood in her stool but admits to feeling nauseous. She has had a number of episodes of emesis although none in the emergency department. The patient states that her chest hurts in that she is short of breath and that pain seems to be radiating from her abdomen all over her body. Notably, the patient has had shortness of breath for more than 6 months. In the emergency department, oxygen saturations were 100% on room air. CT of the abdomen revealed acute diverticulitis of the descending colon which prompted the emergency department to call for admission.   VITAL SIGNS:  Blood pressure (!) 144/54, pulse 69, temperature 98.9 F (37.2 C), temperature source Oral, resp.  rate 16, height 5' (1.524 m), weight 81 kg (178 lb 7.5 oz), SpO2 94 %.  I/O:   Intake/Output Summary (Last 24 hours) at 07/07/16 1223 Last data filed at 07/07/16 0900  Gross per 24 hour  Intake             1800 ml  Output                0 ml  Net             1800 ml    PHYSICAL EXAMINATION:   GENERAL:80 y.o.female currently in no acute distress. Decreased hearing HEAD: Normocephalic, atraumatic.  EYES: Pupils equal, round, reactive to light. Extraocular muscles intact. No scleral icterus.  MOUTH: Moist mucosal membrane. Dentition intact. No abscess noted.  EAR, NOSE, THROAT: Clear without exudates. No external lesions.  NECK: Supple. No thyromegaly. No nodules. No JVD.  PULMONARY: Clear to ascultation, without wheeze rails or rhonci. No use of accessory muscles, Good respiratory effort. good air entry bilaterally CHEST: Nontender to palpation.  CARDIOVASCULAR: S1 and S2. Regular rate and  rhythm. No murmurs, rubs, or gallops. No edema. Pedal pulses 2+ bilaterally.  GASTROINTESTINAL: Soft, nondistended. No masses. Positive bowel sounds. No hepatosplenomegaly. Lower abdominal tenderness MUSCULOSKELETAL: No swelling, clubbing, or edema. Range of motion full in all extremities.  NEUROLOGIC: Cranial nerves II through XII are intact. No gross focal neurological deficits. Sensation intact. Reflexes intact.  SKIN: No ulceration, lesions, rashes, or cyanosis. Skin warm and dry. Turgor intact.  PSYCHIATRIC: Mood, affect within normal limits. The patient is awake, alert and oriented x 3. Insight, judgment intact.   DATA REVIEW:   CBC  Recent Labs Lab 07/04/16 0408  WBC 6.4  HGB 13.4  HCT 39.6  PLT 289    Chemistries   Recent Labs Lab 06/30/16 2220 07/04/16 0408  NA 140 139  K 3.8 3.3*  CL 105 108  CO2 28 27  GLUCOSE 100* 92  BUN 10 7  CREATININE 0.94 0.86  CALCIUM 8.7* 8.0*  AST 23  --   ALT 12*  --   ALKPHOS 73  --   BILITOT 0.6  --     Cardiac Enzymes No results  for input(s): TROPONINI in the last 168 hours.  Microbiology Results  Results for orders placed or performed during the hospital encounter of 06/30/16  Urine culture     Status: Abnormal   Collection Time: 07/04/16  2:58 PM  Result Value Ref Range Status   Specimen Description URINE, RANDOM  Final   Special Requests NONE  Final   Culture (A)  Final    <10,000 COLONIES/mL INSIGNIFICANT GROWTH Performed at [facility]    Report Status 07/05/2016 FINAL  Final    RADIOLOGY:  Ct Abdomen Pelvis W Contrast  Result Date: 07/05/2016 CLINICAL DATA:  Lower abdominal pain x4 days EXAM: CT ABDOMEN AND PELVIS WITH CONTRAST TECHNIQUE: Multidetector CT imaging of the abdomen and pelvis was performed using the standard protocol following bolus administration of intravenous contrast. CONTRAST:  ISOVUE-300 IOPAMIDOL (ISOVUE-300) INJECTION 61% COMPARISON:  07/01/2016 FINDINGS: Lower chest: Mild linear scarring/ atelectasis in the bilateral lower lobes. Hepatobiliary: Liver is within normal limits. Status post cholecystectomy. No intrahepatic ductal dilatation. Common duct measures 14 mm but smoothly tapers at the ampulla. Pancreas: Within normal limits. Spleen: Within normal limits. Adrenals/Urinary Tract: Adrenal glands are within normal limits. Kidneys are within normal limits.  No renal calculi. Mild fullness of the bilateral renal collecting systems. Bladder is within normal limits. Stomach/Bowel: Stomach is within normal limits. Prior appendectomy. Extensive colonic diverticulosis. Associated acute diverticulitis involving the distal descending colon is less conspicuous on the current CT. No drainable fluid collection/ abscess.  No free air. Vascular/Lymphatic: Atherosclerotic calcifications of the abdominal aorta and branch vessels. No evidence of abdominal aortic aneurysm. No suspicious abdominopelvic lymphadenopathy. Reproductive: Status post hysterectomy. Bilateral ovaries are unremarkable.  Other: No abdominopelvic ascites. Musculoskeletal: Mild degenerative changes of the visualized thoracolumbar spine. IMPRESSION: Extensive colonic diverticulosis. Associated acute diverticulitis involving the distal descending colon is less conspicuous on the current CT. No drainable fluid collection/ abscess.  No free air. Additional ancillary findings as above. Electronically Signed   By: Charline Bills M.D.   On: 07/05/2016 13:25    EKG:   Orders placed or performed during the hospital encounter of 06/30/16  . ED EKG  . ED EKG    Management plans discussed with the patient, family and they are in agreement.  CODE STATUS:     Code Status Orders        Start  Ordered   07/01/16 0550  Full code  Continuous     07/01/16 0550    Code Status History    Date Active Date Inactive Code Status Order ID Comments User Context   This patient has a current code status but no historical code status.      TOTAL TIME TAKING CARE OF THIS PATIENT: 35 minutes.    Altamese Dilling M.D on 07/07/2016 at 12:23 PM  Between 7am to 6pm - Pager - [phone]  After 6pm go to [email or web address] - Social research officer, government  [facility]  Office  [phone]  CC: Primary care physician; [facility]   Note: This dictation was prepared with Dragon dictation along with smaller phrase technology. Any transcriptional errors that result from this process are unintentional.

## 2016-07-07 NOTE — Progress Notes (Signed)
Pt alert and oriented. Pt still asking for nausea medication frequently. Medicated for pain x1 with good results. Able to sleep in between care. Refusing to get up to bedside commode, has been incontinent of urine all shift.

## 2016-07-07 NOTE — Progress Notes (Signed)
PT Cancellation Note  Patient Details Name: Marilyn Andrews MRN: [id number] DOB: 1936/01/03   Cancelled Treatment:    Reason Eval/Treat Not Completed: Other (comment).  Upon PT entering room, pt c/o nausea.  PT encouraged pt to participate in therapy but pt reporting she needed to eat breakfast first even though she was nauseas.  CM/SW notified.  Will re-attempt PT treatment at a later date/time.   Marilyn Andrews 07/07/2016, 8:59 AM Marilyn Andrews, PT [phone]

## 2016-07-07 NOTE — NC FL2 (Signed)
[place] MEDICAID FL2 LEVEL OF CARE SCREENING TOOL     IDENTIFICATION  Patient Name: Marilyn Andrews Birthdate: [birth year] Sex: female Admission Date (Current Location): 06/30/2016  [place]   Facility and Address:  [facility], [address], [place], [place] [address]      Provider Number: [id number]  Attending Physician Name and Address:  Altamese DillingVaibhavkumar Kindall Swaby, MD  Relative Name and Phone Number:       Current Level of Care: Hospital Recommended Level of Care: Skilled Nursing Facility Prior Approval Number:    Date Approved/Denied:   PASRR Number:  [id number])  Discharge Plan: SNF    Current Diagnoses: Patient Active Problem List   Diagnosis Date Noted  . Diverticulitis 07/01/2016    Orientation RESPIRATION BLADDER Height & Weight     Self, Time, Situation, Place  Normal Incontinent Weight: 178 lb 7.5 oz (81 kg) Height:  5' (152.4 cm)  BEHAVIORAL SYMPTOMS/MOOD NEUROLOGICAL BOWEL NUTRITION STATUS   (none)  (none) Continent Diet (Diet: Soft )  AMBULATORY STATUS COMMUNICATION OF NEEDS Skin   Extensive Assist Verbally Normal                       Personal Care Assistance Level of Assistance  Bathing, Feeding, Dressing Bathing Assistance: Limited assistance Feeding assistance: Independent Dressing Assistance: Limited assistance     Functional Limitations Info  Sight, Hearing, Speech Sight Info: Adequate Hearing Info: Adequate Speech Info: Adequate    SPECIAL CARE FACTORS FREQUENCY  PT (By licensed PT), OT (By licensed OT)     PT Frequency:  (5) OT Frequency:  (5)            Contractures      Additional Factors Info  Code Status, Allergies Code Status Info:  (Full Code. ) Allergies Info:  (Penicillins )           Current Medications (07/07/2016):  This is the current hospital active medication list Current Facility-Administered Medications  Medication Dose Route Frequency  Provider Last Rate Last Dose  . acetaminophen (TYLENOL) tablet 650 mg  650 mg Oral Q6H PRN Arnaldo NatalMichael S Diamond, MD       Or  . acetaminophen (TYLENOL) suppository 650 mg  650 mg Rectal Q6H PRN Arnaldo NatalMichael S Diamond, MD      . amLODipine (NORVASC) tablet 5 mg  5 mg Oral Daily Arnaldo NatalMichael S Diamond, MD   5 mg at 07/07/16 [id number]  . ciprofloxacin (CIPRO) IVPB 400 mg  400 mg Intravenous BID Wyatt Hasteavid K Hower, MD   400 mg at 07/07/16 0745  . enoxaparin (LOVENOX) injection 40 mg  40 mg Subcutaneous Q24H Arnaldo NatalMichael S Diamond, MD   40 mg at 07/07/16 0509  . escitalopram (LEXAPRO) tablet 20 mg  20 mg Oral Daily Milagros LollSrikar Sudini, MD   20 mg at 07/07/16 0958  . fluticasone (FLONASE) 50 MCG/ACT nasal spray 1 spray  1 spray Each Nare Daily Milagros LollSrikar Sudini, MD   1 spray at 07/07/16 0958  . hydrALAZINE (APRESOLINE) injection 10 mg  10 mg Intravenous Q4H PRN Wyatt Hasteavid K Hower, MD      . hydrocortisone cream 0.5 %   Topical PRN Srikar Sudini, MD      . metroNIDAZOLE (FLAGYL) IVPB 500 mg  500 mg Intravenous Q8H Arnaldo NatalMichael S Diamond, MD   500 mg at 07/07/16 0509  . ondansetron (ZOFRAN) tablet 4 mg  4 mg Oral Q6H PRN Milagros LollSrikar Sudini, MD   4 mg at  07/07/16 0241   Or  . ondansetron (ZOFRAN) injection 4 mg  4 mg Intravenous Q6H PRN Milagros Loll, MD   4 mg at 07/05/16 0742  . oxyCODONE (Oxy IR/ROXICODONE) immediate release tablet 5 mg  5 mg Oral Q4H PRN Milagros Loll, MD   5 mg at 07/07/16 0631  . promethazine (PHENERGAN) injection 12.5 mg  12.5 mg Intravenous Q4H PRN Wyatt Haste, MD   12.5 mg at 07/06/16 1610  . senna-docusate (Senokot-S) tablet 2 tablet  2 tablet Oral BID Milagros Loll, MD   2 tablet at 07/07/16 9604     Discharge Medications: Please see discharge summary for a list of discharge medications.  Relevant Imaging Results:  Relevant Lab Results:   Additional Information  (SSN: [id number])  Sample, Darleen Crocker, LCSW

## 2016-07-07 NOTE — Care Management (Signed)
Patient has not gotten up out of bed over the holiday weekend.  She has refused to get up out of bed to bedside commode and refused to get up in the recliner.  Today, physical therapy is recommending skilled nursing facility placement.  spoke with patient.  She acknowledges that she can not stand or ambulate without max assist and that this would be more than her sister would be able to manage.  Says that she is reluctant to go to a facility because she does not have any clothes.  Says she has gained so much weight that none of her clothes fit.  She is in  agreement with bed search.  Discussed the only other disposition would be to discharge home with home health but she would be at risk for injury due to her debility and lack of physical able body caregiver support.  Updated csw .  It was reported to this CM that discharge is anticipated for today.

## 2016-07-07 NOTE — Progress Notes (Signed)
Pt being discharged to [facility] later today. PIV removed. [facility] to [facility], all questions answered. She was educated on need to complete antibiotic therapy, she verbalized understanding. Family and pt are aware and in agreement with plan. She is leaving with all her belongings, will be transported via EMS.

## 2016-07-07 NOTE — Clinical Social Work Placement (Signed)
   CLINICAL SOCIAL WORK PLACEMENT  NOTE  Date:  07/07/2016  Patient Details  Name: Cleotis Nippereggy M Ficken MRN: [id number] Date of Birth: Aug 03, 1936  Clinical Social Work is seeking post-discharge placement for this patient at the Skilled  Nursing Facility level of care (*CSW will initial, date and re-position this form in  chart as items are completed):  Yes   Patient/family provided with [place] Clinical Social Work Department's list of facilities offering this level of care within the geographic area requested by the patient (or if unable, by the patient's family).  Yes   Patient/family informed of their freedom to choose among providers that offer the needed level of care, that participate in Medicare, Medicaid or managed care program needed by the patient, have an available bed and are willing to accept the patient.  Yes   Patient/family informed of [facility]'s ownership interest in [facility] and [facility], as well as of the fact that they are under no obligation to receive care at these facilities.  PASRR submitted to EDS on 07/07/16     PASRR number received on 07/07/16     Existing PASRR number confirmed on       FL2 transmitted to all facilities in geographic area requested by pt/family on 07/07/16     FL2 transmitted to all facilities within larger geographic area on       Patient informed that his/her managed care company has contracts with or will negotiate with certain facilities, including the following:        Yes   Patient/family informed of bed offers received.  Patient chooses bed at  [facility] )     Physician recommends and patient chooses bed at      Patient to be transferred to  [facility] ) on 07/07/16.  Patient to be transferred to facility by  [facility] )     Patient family notified on 07/07/16 of transfer.  Name of family member notified:   (Patient's son Alinda Moneyony is aware of D/C today. )     PHYSICIAN       Additional Comment:     _______________________________________________ Masaru Chamberlin, Darleen CrockerBailey M, LCSW 07/07/2016, 1:28 PM

## 2016-07-10 ENCOUNTER — Telehealth: Payer: Self-pay

## 2016-07-10 NOTE — Telephone Encounter (Signed)
Pt son called Marilyn Andrews(Blaine) stating ms Chimenti was in [facility] for infection weak/diverticias.  [facility] wanted to discharge her to [facility] and have you see her there and have her do PT.  Pt  signed herself out not wanting to go to [facility].  Pt has been going [facility].  Son wanted to get pt set up here with you.  Can I make this appointment if so what date and time?  [facility] took her off all her meds.  Pt was discharged from [facility] 07/15/16 Pt went to [facility] and signed herself out 07/15/16 night Pt doesn't have a pcp Best number to call [phone] or [phone]

## 2016-07-11 NOTE — Telephone Encounter (Signed)
I was going to see her Thursday morning at [facility] and had set up that appointment before she left. I am okay with adding her on but don't know how quickly I can get her in at the office (explain that that is a totally different schedule). You can see what you can work out to get her in

## 2016-07-13 NOTE — Telephone Encounter (Signed)
Better make it this week--just sent out of hospital She is in Epic--so hopefully 30 minutes should be enough

## 2016-07-13 NOTE — Telephone Encounter (Signed)
Do you want patient to come in on 07/16/16 at 4:00 or 07/28/16 at 12:00?

## 2016-07-13 NOTE — Telephone Encounter (Signed)
Marilyn Andrews  Can you help me with this Thanks

## 2016-07-13 NOTE — Telephone Encounter (Signed)
Okay; thanks.

## 2016-07-13 NOTE — Telephone Encounter (Signed)
I spoke with patient and asked her to be here at 12:00 this Friday.  Patient couldn't come tomorrow.  I rescheduled patient at 12:00 on Friday to 11:15, so Dr.Letvak can have 45 minutes with patient.

## 2016-07-17 ENCOUNTER — Ambulatory Visit: Payer: Medicare Other | Admitting: Internal Medicine

## 2016-07-17 ENCOUNTER — Telehealth: Payer: Self-pay

## 2016-07-17 ENCOUNTER — Telehealth: Payer: Self-pay | Admitting: Internal Medicine

## 2016-07-17 ENCOUNTER — Encounter: Payer: Self-pay | Admitting: Emergency Medicine

## 2016-07-17 ENCOUNTER — Emergency Department: Payer: Medicare Other

## 2016-07-17 ENCOUNTER — Emergency Department
Admission: EM | Admit: 2016-07-17 | Discharge: 2016-07-18 | Disposition: A | Discharge status: Home / Self Care | Payer: Medicare Other | Attending: Emergency Medicine | Admitting: Emergency Medicine

## 2016-07-17 DIAGNOSIS — Z791 Long term (current) use of non-steroidal anti-inflammatories (NSAID): Secondary | ICD-10-CM | POA: Insufficient documentation

## 2016-07-17 DIAGNOSIS — N390 Urinary tract infection, site not specified: Secondary | ICD-10-CM | POA: Diagnosis not present

## 2016-07-17 DIAGNOSIS — R109 Unspecified abdominal pain: Secondary | ICD-10-CM

## 2016-07-17 DIAGNOSIS — E876 Hypokalemia: Secondary | ICD-10-CM

## 2016-07-17 DIAGNOSIS — I10 Essential (primary) hypertension: Secondary | ICD-10-CM | POA: Insufficient documentation

## 2016-07-17 DIAGNOSIS — Z79899 Other long term (current) drug therapy: Secondary | ICD-10-CM | POA: Diagnosis not present

## 2016-07-17 DIAGNOSIS — R1032 Left lower quadrant pain: Secondary | ICD-10-CM | POA: Diagnosis present

## 2016-07-17 DIAGNOSIS — R112 Nausea with vomiting, unspecified: Secondary | ICD-10-CM

## 2016-07-17 HISTORY — DX: Diverticulitis of intestine, part unspecified, without perforation or abscess without bleeding: K57.92

## 2016-07-17 LAB — URINALYSIS COMPLETE WITH MICROSCOPIC (ARMC ONLY)
Bilirubin Urine: NEGATIVE
Glucose, UA: NEGATIVE mg/dL
Hgb urine dipstick: NEGATIVE
Ketones, ur: NEGATIVE mg/dL
Nitrite: NEGATIVE
Protein, ur: 100 mg/dL — AB
Specific Gravity, Urine: 1.023 (ref 1.005–1.030)
Trans Epithel, UA: 1
pH: 5 (ref 5.0–8.0)

## 2016-07-17 LAB — COMPREHENSIVE METABOLIC PANEL
ALT: 10 U/L — ABNORMAL LOW (ref 14–54)
AST: 19 U/L (ref 15–41)
Albumin: 3.7 g/dL (ref 3.5–5.0)
Alkaline Phosphatase: 65 U/L (ref 38–126)
Anion gap: 8 (ref 5–15)
BUN: 7 mg/dL (ref 6–20)
CO2: 27 mmol/L (ref 22–32)
Calcium: 8.6 mg/dL — ABNORMAL LOW (ref 8.9–10.3)
Chloride: 104 mmol/L (ref 101–111)
Creatinine, Ser: 1 mg/dL (ref 0.44–1.00)
GFR calc Af Amer: >60 mL/min (ref >60)
GFR calc non Af Amer: 53 mL/min — ABNORMAL LOW (ref >60)
Glucose, Bld: 97 mg/dL (ref 65–99)
Potassium: 2.7 mmol/L — CL (ref 3.5–5.1)
Sodium: 139 mmol/L (ref 135–145)
Total Bilirubin: 0.5 mg/dL (ref 0.3–1.2)
Total Protein: 7.3 g/dL (ref 6.5–8.1)

## 2016-07-17 LAB — CBC
HCT: 43.9 % (ref 35.0–47.0)
Hemoglobin: 14.7 g/dL (ref 12.0–16.0)
MCH: 30.7 pg (ref 26.0–34.0)
MCHC: 33.4 g/dL (ref 32.0–36.0)
MCV: 91.7 fL (ref 80.0–100.0)
Platelets: 487 10*3/uL — ABNORMAL HIGH (ref 150–440)
RBC: 4.79 MIL/uL (ref 3.80–5.20)
RDW: 13.3 % (ref 11.5–14.5)
WBC: 8 10*3/uL (ref 3.6–11.0)

## 2016-07-17 LAB — LIPASE, BLOOD: Lipase: 23 U/L (ref 11–51)

## 2016-07-17 MED ORDER — ONDANSETRON 4 MG PO TBDP: 4.0000 mg | ORAL_TABLET | Freq: Three times a day (TID) | ORAL | 0 refills | Status: DC | PRN

## 2016-07-17 MED ORDER — OXYCODONE-ACETAMINOPHEN 5-325 MG PO TABS: 1.0000 | ORAL_TABLET | ORAL | 0 refills | Status: DC | PRN

## 2016-07-17 MED ORDER — KETOROLAC TROMETHAMINE 30 MG/ML IJ SOLN
15.0000 mg | Freq: Once | INTRAMUSCULAR | Status: AC
  Administered 2016-07-17: 15 mg via INTRAVENOUS
  Filled 2016-07-17: qty 1

## 2016-07-17 MED ORDER — DEXTROSE 5 % IV SOLN
1.0000 g | Freq: Once | INTRAVENOUS | Status: AC
  Administered 2016-07-17: 1 g via INTRAVENOUS
  Filled 2016-07-17: qty 10

## 2016-07-17 MED ORDER — POTASSIUM CHLORIDE 10 MEQ/100ML IV SOLN
10.0000 meq | Freq: Once | INTRAVENOUS | Status: AC
  Administered 2016-07-17: 10 meq via INTRAVENOUS
  Filled 2016-07-17: qty 100

## 2016-07-17 MED ORDER — MORPHINE SULFATE (PF) 4 MG/ML IV SOLN
4.0000 mg | Freq: Once | INTRAVENOUS | Status: AC
  Administered 2016-07-17: 4 mg via INTRAVENOUS
  Filled 2016-07-17: qty 1

## 2016-07-17 MED ORDER — POTASSIUM CHLORIDE ER 10 MEQ PO TBCR: 10.0000 meq | EXTENDED_RELEASE_TABLET | Freq: Every day | ORAL | 1 refills | Status: DC

## 2016-07-17 MED ORDER — IOPAMIDOL (ISOVUE-300) INJECTION 61%
100.0000 mL | Freq: Once | INTRAVENOUS | Status: AC | PRN
  Administered 2016-07-17: 100 mL via INTRAVENOUS

## 2016-07-17 MED ORDER — POTASSIUM CHLORIDE CRYS ER 20 MEQ PO TBCR
40.0000 meq | EXTENDED_RELEASE_TABLET | Freq: Once | ORAL | Status: DC
  Filled 2016-07-17: qty 2

## 2016-07-17 MED ORDER — CEPHALEXIN 500 MG PO CAPS: 500.0000 mg | ORAL_CAPSULE | Freq: Four times a day (QID) | ORAL | 0 refills | Status: AC

## 2016-07-17 MED ORDER — ONDANSETRON HCL 4 MG/2ML IJ SOLN
4.0000 mg | Freq: Once | INTRAMUSCULAR | Status: AC
  Administered 2016-07-17: 4 mg via INTRAVENOUS
  Filled 2016-07-17: qty 2

## 2016-07-17 MED ORDER — SODIUM CHLORIDE 0.9 % IV BOLUS (SEPSIS)
1000.0000 mL | Freq: Once | INTRAVENOUS | Status: AC
  Administered 2016-07-17: 1000 mL via INTRAVENOUS

## 2016-07-17 MED ORDER — IOPAMIDOL (ISOVUE-300) INJECTION 61%
30.0000 mL | Freq: Once | INTRAVENOUS | Status: AC | PRN
  Administered 2016-07-17: 30 mL via ORAL

## 2016-07-17 MED ORDER — PROMETHAZINE HCL 25 MG/ML IJ SOLN
12.5000 mg | Freq: Once | INTRAMUSCULAR | Status: AC
Start: 2016-07-17 — End: 2016-07-17
  Administered 2016-07-17: 12.5 mg via INTRAVENOUS
  Filled 2016-07-17: qty 1

## 2016-07-17 NOTE — Discharge Instructions (Signed)

## 2016-07-17 NOTE — Telephone Encounter (Signed)
Tried call number  No answer.  Per shannon schedule in next new patient appointment   Next new patient appointment in 2018

## 2016-07-17 NOTE — Telephone Encounter (Signed)
This is basically 2 cancellations by her. I will not do an earlier accomodation again. If they still want appt, make it in 2018

## 2016-07-17 NOTE — Telephone Encounter (Signed)
Lebron ConnersMarcy called to cancel pt appointment.  She stated she could not get pt out of bed pt stated she didn't feel good. Lebron ConnersMarcy wanted to r/s appointment please advise date and time.  Sent marcy to [facility].

## 2016-07-17 NOTE — ED Triage Notes (Signed)
Pt presents to ED from home with c/o abdominal pain and left side pain. Pt c/o LLQ which was similar to the pain she had when she was admitted recently. Pt states she was diagnosed with diverticulitis which has not resolved. Pt c/o pain that worsened yesterday. Pt states when she was discharged from the hospital she was still in pain.

## 2016-07-17 NOTE — Telephone Encounter (Signed)
[facility] Day - Client TELEPHONE ADVICE RECORD [facility] Patient Name: Marilyn Andrews DOB: [birth year] Initial Comment Caller states mother in law c/o abdominal pain. Caller states she has her 1st appt with Dr. Alphonsus SiasLetvak today at 12pm. Nurse Assessment Nurse: Debera Latalston, RN, Tinnie GensJeffrey Date/Time Lamount Cohen(Eastern Time): 07/17/2016 11:58:45 AM Confirm and document reason for call. If symptomatic, describe symptoms. You must click the next button to save text entered. ---Caller states mother in law c/o abdominal pain. Caller states she has her 1st appt with Dr. Alphonsus SiasLetvak today at 12pm. Abdominal pain for 2 weeks. Was in hospital and discharged last Friday with diverticulitis. Has the patient traveled out of the country within the last 30 days? ---No Does the patient have any new or worsening symptoms? ---Yes Will a triage be completed? ---Yes Related visit to physician within the last 2 weeks? ---Yes Does the PT have any chronic conditions? (i.e. diabetes, asthma, etc.) ---No Is this a behavioral health or substance abuse call? ---No Guidelines Guideline Title Affirmed Question Affirmed Notes Abdominal Pain - Female [1] MILD-MODERATE pain AND [2] constant AND [3] present > 2 hours Final Disposition User See Physician within 4 Hours (or PCP triage) Debera Latalston, RN, Tinnie GensJeffrey Comments Called advised that patient refused to go to appointment with MD and is refusing to go to hospital. Referrals GO TO FACILITY REFUSED Disagree/Comply: Comply

## 2016-07-17 NOTE — ED Triage Notes (Signed)
Crit K+ 2.7, lab called now.

## 2016-07-17 NOTE — ED Provider Notes (Signed)
[facility] Emergency Department Provider Note  ____________________________________________  Time seen: Approximately 7:57 PM  I have reviewed the triage vital signs and the nursing notes.   HISTORY  Chief Complaint Abdominal Pain and Flank Pain   HPI Marilyn Andrews is a 80 y.o. female with a history of hypertension and diverticulitis who presents for evaluation of abdominal pain. Patient was discharged from the hospital on 07/07/16 when she was admitted for diverticulitis. She still on antibiotics for it. She reports that since going home she has had persistent pain however the pain has gotten worse today. She is also started to have nausea and multiple episodes of nonbloody nonbilious emesis today. She also endorses dysuria that has been going on for 2 days. She denies fever or chills, diarrhea, hematuria, prior history of kidney stones. She reports that the pain feels similar to her prior episodes of diverticulitis. She currently endorses severe pain, located in the left lower quadrant, radiating to her left flank, sharp, constant.  Past Medical History:  Diagnosis Date  . Anxiety   . Diverticulitis   . Hypertension     Patient Active Problem List   Diagnosis Date Noted  . Diverticulitis 07/01/2016    Past Surgical History:  Procedure Laterality Date  . ABDOMINAL HYSTERECTOMY    . APPENDECTOMY    . CHOLECYSTECTOMY    . HYSTERECTOMY ABDOMINAL WITH SALPINGECTOMY      Prior to Admission medications   Medication Sig Start Date End Date Taking? Authorizing Provider  acetaminophen (TYLENOL) 325 MG tablet Take 650 mg by mouth every 6 (six) hours as needed for mild pain.     Historical Provider, MD  amLODipine (NORVASC) 5 MG tablet Take 5 mg by mouth daily.    Historical Provider, MD  cephALEXin (KEFLEX) 500 MG capsule Take 1 capsule (500 mg total) by mouth 4 (four) times daily. 07/17/16 07/27/16  Nita Sickle, MD  ciprofloxacin (CIPRO) 500 MG tablet  Take 1 tablet (500 mg total) by mouth 2 (two) times daily. 07/07/16   Altamese Dilling, MD  escitalopram (LEXAPRO) 20 MG tablet Take 20 mg by mouth daily.    Historical Provider, MD  ibuprofen (ADVIL,MOTRIN) 600 MG tablet Take 600 mg by mouth every 8 (eight) hours as needed for moderate pain.    Historical Provider, MD  metroNIDAZOLE (FLAGYL) 250 MG tablet Take 1 tablet (250 mg total) by mouth 3 (three) times daily. 07/07/16   Altamese Dilling, MD  ondansetron (ZOFRAN ODT) 4 MG disintegrating tablet Take 1 tablet (4 mg total) by mouth every 8 (eight) hours as needed for nausea or vomiting. 07/17/16   Nita Sickle, MD  oxyCODONE-acetaminophen (ROXICET) 5-325 MG tablet Take 1 tablet by mouth every 4 (four) hours as needed for severe pain. 07/17/16   Nita Sickle, MD  potassium chloride (KLOR-CON 10) 10 MEQ tablet Take 1 tablet (10 mEq total) by mouth daily. 07/17/16   Nita Sickle, MD  potassium chloride SA (K-DUR,KLOR-CON) 20 MEQ tablet Take 1 tablet (20 mEq total) by mouth daily. 07/07/16   Altamese Dilling, MD  ranitidine (ZANTAC) 150 MG tablet Take 150 mg by mouth daily.    Historical Provider, MD  triamcinolone cream (KENALOG) 0.1 % Apply 1 application topically daily as needed (rash on hands).    Historical Provider, MD    Allergies Penicillins  Family History  Problem Relation Age of Onset  . Hypertension Father   . CAD Brother     Social History Social History  Substance Use  Topics  . Smoking status: Never Smoker  . Smokeless tobacco: Never Used  . Alcohol use No    Review of Systems  Constitutional: Negative for fever. Eyes: Negative for visual changes. ENT: Negative for sore throat. Cardiovascular: Negative for chest pain. Respiratory: Negative for shortness of breath. Gastrointestinal: + left sided abdominal pain, nausea. and vomiting. No diarrhea. Genitourinary: + dysuria. Musculoskeletal: Negative for back pain. Skin: Negative for  rash. Neurological: Negative for headaches, weakness or numbness.  ____________________________________________   PHYSICAL EXAM:  VITAL SIGNS: ED Triage Vitals [07/17/16 1846]  Enc Vitals Group     BP 139/69     Pulse Rate (!) 58     Resp 16     Temp 98.1 F (36.7 C)     Temp Source Oral     SpO2 98 %     Weight 178 lb (80.7 kg)     Height 5' (1.524 m)     Head Circumference      Peak Flow      Pain Score 8     Pain Loc      Pain Edu?      Excl. in GC?     Constitutional: Alert and oriented. Well appearing and in no apparent distress. HEENT:      Head: Normocephalic and atraumatic.         Eyes: Conjunctivae are normal. Sclera is non-icteric. EOMI. PERRL      Mouth/Throat: Mucous membranes are moist.       Neck: Supple with no signs of meningismus. Cardiovascular: Regular rate and rhythm. No murmurs, gallops, or rubs. 2+ symmetrical distal pulses are present in all extremities. No JVD. Respiratory: Normal respiratory effort. Lungs are clear to auscultation bilaterally. No wheezes, crackles, or rhonchi.  Gastrointestinal: Soft, ttp over the LLQ, and non distended with positive bowel sounds. No rebound or guarding. Genitourinary: Left CVA tenderness. Musculoskeletal: Nontender with normal range of motion in all extremities. No edema, cyanosis, or erythema of extremities. Neurologic: Normal speech and language. Face is symmetric. Moving all extremities. No gross focal neurologic deficits are appreciated. Skin: Skin is warm, dry and intact. No rash noted. Psychiatric: Mood and affect are normal. Speech and behavior are normal.  ____________________________________________   LABS (all labs ordered are listed, but only abnormal results are displayed)  Labs Reviewed  COMPREHENSIVE METABOLIC PANEL - Abnormal; Notable for the following:       Result Value   Potassium 2.7 (*)    Calcium 8.6 (*)    ALT 10 (*)    GFR calc non Af Amer 53 (*)    All other components within  normal limits  CBC - Abnormal; Notable for the following:    Platelets 487 (*)    All other components within normal limits  URINALYSIS COMPLETEWITH MICROSCOPIC ([facility] ONLY) - Abnormal; Notable for the following:    Color, Urine AMBER (*)    APPearance CLOUDY (*)    Protein, ur 100 (*)    Leukocytes, UA 2+ (*)    Bacteria, UA FEW (*)    Squamous Epithelial / LPF TOO NUMEROUS TO COUNT (*)    All other components within normal limits  URINE CULTURE  LIPASE, BLOOD   ____________________________________________  EKG  none ____________________________________________  RADIOLOGY  CT a/p: 1. Recent acute diverticulitis with only minimal residual or recurrent soft tissue stranding involving a diverticulum of the distal descending colon. No perforation or new areas of diverticular inflammation. 2. Bladder wall thickening suspicious for cystitis/urinary tract  infection. No urolithiasis or CT findings of pyelonephritis.  ____________________________________________   PROCEDURES  Procedure(s) performed: None Procedures Critical Care performed:  None ____________________________________________   INITIAL IMPRESSION / ASSESSMENT AND PLAN / ED COURSE  80 y.o. female with a history of hypertension and diverticulitis who presents for evaluation of persistent left-sided abdominal pain now radiating to her left flank since being diagnosed with diverticulitis 10 days ago. She still on antibiotics. Today the pain got worse and she had multiple episodes of nonbloody nonbilious emesis. She endorses severe nausea. Also having dysuria for 2 days. On exam patient is well-appearing, in no distress, her vital signs are within normal limits, she has left-sided flank tenderness and left lower quadrant tenderness. Her labs show normal white count, normal kidney function, hypokalemia with K of 2.7, and a UA with RBCs, WBCs, bacteria, leukocytes. Differential diagnoses including diverticulitis plus a UTI  versus pyelonephritis versus possibly superinfected kidney stone with blood in her urine. Plan to repeat CT abdomen and pelvis at this time. We'll treat her symptoms with IV fluids, IV Zofran, IV morphine.  Clinical Course  Comment By Time  CT showing resolved diverticulitis with inflammation around the bladder. No evidence of kidney stone. Patient with normal white count and normal vital signs. She is still complaining of abdominal pain. Give her second round of pain medications, IV fluids, IV ceftriaxone, we'll replete her hypokalemia. I anticipate discharge on Keflex and close follow-up with primary care doctor. Nita Sickle, MD 09/15 2243    Pertinent labs & imaging results that were available during my care of the patient were reviewed by me and considered in my medical decision making (see chart for details).    ____________________________________________   FINAL CLINICAL IMPRESSION(S) / ED DIAGNOSES  Final diagnoses:  UTI (lower urinary tract infection)  Hypokalemia  Abdominal pain, unspecified abdominal location  Non-intractable vomiting with nausea, vomiting of unspecified type      NEW MEDICATIONS STARTED DURING THIS VISIT:  New Prescriptions   CEPHALEXIN (KEFLEX) 500 MG CAPSULE    Take 1 capsule (500 mg total) by mouth 4 (four) times daily.   ONDANSETRON (ZOFRAN ODT) 4 MG DISINTEGRATING TABLET    Take 1 tablet (4 mg total) by mouth every 8 (eight) hours as needed for nausea or vomiting.   OXYCODONE-ACETAMINOPHEN (ROXICET) 5-325 MG TABLET    Take 1 tablet by mouth every 4 (four) hours as needed for severe pain.   POTASSIUM CHLORIDE (KLOR-CON 10) 10 MEQ TABLET    Take 1 tablet (10 mEq total) by mouth daily.     Note:  This document was prepared using Dragon voice recognition software and may include unintentional dictation errors.    Nita Sickle, MD 07/17/16 2250

## 2016-07-17 NOTE — ED Notes (Signed)
Pt co nausea, unable to drink contrast

## 2016-07-17 NOTE — Telephone Encounter (Signed)
I called and spoke with pt ; pt said pain level for abd pain is 8. Pt has never seen Dr Alphonsus SiasLetvak and cancelled new pt appt today. Pt agreed if she could find a way to ED she would go; Spoke with Kathie RhodesBetty who is at pts home and Kathie RhodesBetty will get daughter in law to take pt to [facility] ED. FYI to Dr Alphonsus SiasLetvak.

## 2016-07-18 MED ORDER — HYDROMORPHONE HCL 2 MG PO TABS: 2.0000 mg | ORAL_TABLET | Freq: Once | ORAL | Status: DC

## 2016-07-18 MED ORDER — POTASSIUM CHLORIDE 20 MEQ PO PACK
40.0000 meq | PACK | Freq: Two times a day (BID) | ORAL | Status: DC
  Administered 2016-07-18: 40 meq via ORAL
  Filled 2016-07-18: qty 2

## 2016-07-18 MED ORDER — ONDANSETRON 4 MG PO TBDP
4.0000 mg | ORAL_TABLET | Freq: Once | ORAL | Status: AC
  Administered 2016-07-18: 4 mg via ORAL

## 2016-07-18 MED ORDER — ONDANSETRON 4 MG PO TBDP
ORAL_TABLET | ORAL | Status: AC
  Administered 2016-07-18: 4 mg via ORAL
  Filled 2016-07-18: qty 1

## 2016-07-18 NOTE — Telephone Encounter (Signed)
Noted She is not really my patient at this point--they never had an appointment

## 2016-07-18 NOTE — ED Provider Notes (Signed)
-----------------------------------------   12:37 AM on 07/18/2016 -----------------------------------------   Blood pressure 137/68, pulse 79, temperature 98.1 F (36.7 C), temperature source Oral, resp. rate (!) 25, height 5' (1.524 m), weight 178 lb (80.7 kg), SpO2 94 %.  Assuming care from Dr.  Don PerkingVeronese.  In short, Marilyn Andrews is a 80 y.o. female with a chief complaint of Abdominal Pain and Flank Pain .  Refer to the original H&P for additional details.  The current plan of care is to *reexamine the patient is still having some persistent pain I gave her some Dilaudid though she doesn't appear to be in discomfort she still describes left-sided abdominal pain. Patient tolerated her ceftriaxone and does not exhibit signs of sepsis and I felt was hemodynamically stable and did not feel there was any reason to admit the patient to the hospital at this time. She'll be given prescriptions for pain and antibiotic therapy that was prescribed by Dr.Veronese    Marilyn MoccasinBrian S Quigley, MD 07/18/16 [phone]

## 2016-07-19 LAB — URINE CULTURE

## 2017-03-10 ENCOUNTER — Emergency Department: Payer: Medicare Other

## 2017-03-10 ENCOUNTER — Emergency Department
Admission: EM | Admit: 2017-03-10 | Discharge: 2017-03-10 | Disposition: A | Discharge status: Home / Self Care | Payer: Medicare Other | Attending: Emergency Medicine | Admitting: Emergency Medicine

## 2017-03-10 ENCOUNTER — Encounter: Payer: Self-pay | Admitting: *Deleted

## 2017-03-10 DIAGNOSIS — N390 Urinary tract infection, site not specified: Secondary | ICD-10-CM | POA: Diagnosis not present

## 2017-03-10 DIAGNOSIS — R1030 Lower abdominal pain, unspecified: Secondary | ICD-10-CM | POA: Diagnosis present

## 2017-03-10 DIAGNOSIS — Z791 Long term (current) use of non-steroidal anti-inflammatories (NSAID): Secondary | ICD-10-CM | POA: Insufficient documentation

## 2017-03-10 DIAGNOSIS — I10 Essential (primary) hypertension: Secondary | ICD-10-CM | POA: Diagnosis not present

## 2017-03-10 DIAGNOSIS — M79604 Pain in right leg: Secondary | ICD-10-CM | POA: Diagnosis not present

## 2017-03-10 DIAGNOSIS — Z79899 Other long term (current) drug therapy: Secondary | ICD-10-CM | POA: Diagnosis not present

## 2017-03-10 LAB — URINALYSIS, COMPLETE (UACMP) WITH MICROSCOPIC
Bilirubin Urine: NEGATIVE
Glucose, UA: NEGATIVE mg/dL
Hgb urine dipstick: NEGATIVE
Ketones, ur: NEGATIVE mg/dL
Nitrite: POSITIVE — AB
Protein, ur: 30 mg/dL — AB
Specific Gravity, Urine: 1.016 (ref 1.005–1.030)
pH: 5 (ref 5.0–8.0)

## 2017-03-10 LAB — CBC
HCT: 42.9 % (ref 35.0–47.0)
Hemoglobin: 14.4 g/dL (ref 12.0–16.0)
MCH: 28.4 pg (ref 26.0–34.0)
MCHC: 33.6 g/dL (ref 32.0–36.0)
MCV: 84.8 fL (ref 80.0–100.0)
Platelets: 357 10*3/uL (ref 150–440)
RBC: 5.06 MIL/uL (ref 3.80–5.20)
RDW: 14.1 % (ref 11.5–14.5)
WBC: 8.4 10*3/uL (ref 3.6–11.0)

## 2017-03-10 LAB — COMPREHENSIVE METABOLIC PANEL
ALT: 12 U/L — ABNORMAL LOW (ref 14–54)
AST: 35 U/L (ref 15–41)
Albumin: 3.8 g/dL (ref 3.5–5.0)
Alkaline Phosphatase: 73 U/L (ref 38–126)
Anion gap: 11 (ref 5–15)
BUN: 9 mg/dL (ref 6–20)
CO2: 26 mmol/L (ref 22–32)
Calcium: 9 mg/dL (ref 8.9–10.3)
Chloride: 100 mmol/L — ABNORMAL LOW (ref 101–111)
Creatinine, Ser: 1.09 mg/dL — ABNORMAL HIGH (ref 0.44–1.00)
GFR calc Af Amer: 54 mL/min — ABNORMAL LOW (ref >60)
GFR calc non Af Amer: 47 mL/min — ABNORMAL LOW (ref >60)
Glucose, Bld: 99 mg/dL (ref 65–99)
Potassium: 3 mmol/L — ABNORMAL LOW (ref 3.5–5.1)
Sodium: 137 mmol/L (ref 135–145)
Total Bilirubin: 0.7 mg/dL (ref 0.3–1.2)
Total Protein: 7.1 g/dL (ref 6.5–8.1)

## 2017-03-10 LAB — TROPONIN I
Troponin I: <0.03 ng/mL (ref <0.03)
Troponin I: <0.03 ng/mL (ref <0.03)

## 2017-03-10 LAB — LIPASE, BLOOD: Lipase: 22 U/L (ref 11–51)

## 2017-03-10 MED ORDER — LEVOFLOXACIN IN D5W 500 MG/100ML IV SOLN
500.0000 mg | Freq: Once | INTRAVENOUS | Status: AC
  Administered 2017-03-10: 500 mg via INTRAVENOUS
  Filled 2017-03-10: qty 100

## 2017-03-10 MED ORDER — ONDANSETRON HCL 4 MG/2ML IJ SOLN
4.0000 mg | Freq: Once | INTRAMUSCULAR | Status: AC
  Administered 2017-03-10: 4 mg via INTRAVENOUS

## 2017-03-10 MED ORDER — HYDROCODONE-ACETAMINOPHEN 5-325 MG PO TABS: 1.0000 | ORAL_TABLET | ORAL | 0 refills | Status: DC | PRN

## 2017-03-10 MED ORDER — ONDANSETRON HCL 4 MG/2ML IJ SOLN
INTRAMUSCULAR | Status: AC
  Administered 2017-03-10: 4 mg via INTRAVENOUS
  Filled 2017-03-10: qty 2

## 2017-03-10 MED ORDER — ONDANSETRON 4 MG PO TBDP: 4.0000 mg | ORAL_TABLET | Freq: Three times a day (TID) | ORAL | 0 refills | Status: DC | PRN

## 2017-03-10 MED ORDER — ONDANSETRON 4 MG PO TBDP
4.0000 mg | ORAL_TABLET | Freq: Once | ORAL | Status: AC
  Administered 2017-03-10: 4 mg via ORAL
  Filled 2017-03-10: qty 1

## 2017-03-10 MED ORDER — OXYCODONE-ACETAMINOPHEN 5-325 MG PO TABS
1.0000 | ORAL_TABLET | Freq: Once | ORAL | Status: AC
  Administered 2017-03-10: 1 via ORAL
  Filled 2017-03-10: qty 1

## 2017-03-10 MED ORDER — MORPHINE SULFATE (PF) 4 MG/ML IV SOLN
4.0000 mg | Freq: Once | INTRAVENOUS | Status: AC
  Administered 2017-03-10: 4 mg via INTRAVENOUS
  Filled 2017-03-10: qty 1

## 2017-03-10 MED ORDER — ACETAMINOPHEN 325 MG PO TABS
650.0000 mg | ORAL_TABLET | Freq: Once | ORAL | Status: AC
  Administered 2017-03-10: 650 mg via ORAL
  Filled 2017-03-10: qty 2

## 2017-03-10 MED ORDER — LEVOFLOXACIN 500 MG PO TABS
500.0000 mg | ORAL_TABLET | Freq: Every day | ORAL | 0 refills | Status: AC
Start: 2017-03-10 — End: 2017-03-17

## 2017-03-10 MED ORDER — ONDANSETRON HCL 4 MG/2ML IJ SOLN
INTRAMUSCULAR | Status: AC
  Filled 2017-03-10: qty 2

## 2017-03-10 NOTE — ED Provider Notes (Addendum)
[facility] Emergency Department Provider Note  Time seen: 11:41 AM  I have reviewed the triage vital signs and the nursing notes.   HISTORY  Chief Complaint Abdominal Pain    HPI Marilyn Andrews is a 81 y.o. female with a past medical history of anxiety, diverticulitis, hypertension, presents to the emergency department right lower extremity pain. According to the patient for the past 3 days she has been experiencing increased right lower extremity pain which starts from the right hip and goes down into the calf. States the pain is somewhat worse with movement. Denies any trauma. She states the pain has now moved proximally and involves the right side of her abdomen as well. Denies any diarrhea, nausea, vomiting, dysuria but does state cloudy urine. States the pain radiates to her right lower back as well. Describes the pain as moderate to significant, 9/10 in severity located mostly in the right thigh. States mild right-sided abdominal discomfort.  Past Medical History:  Diagnosis Date  . Anxiety   . Diverticulitis   . Hypertension     Patient Active Problem List   Diagnosis Date Noted  . Diverticulitis 07/01/2016    Past Surgical History:  Procedure Laterality Date  . ABDOMINAL HYSTERECTOMY    . APPENDECTOMY    . CHOLECYSTECTOMY    . HYSTERECTOMY ABDOMINAL WITH SALPINGECTOMY      Prior to Admission medications   Medication Sig Start Date End Date Taking? Authorizing Provider  acetaminophen (TYLENOL) 325 MG tablet Take 650 mg by mouth every 6 (six) hours as needed for mild pain.     [provider]  amLODipine (NORVASC) 5 MG tablet Take 5 mg by mouth daily.    [provider]  ciprofloxacin (CIPRO) 500 MG tablet Take 1 tablet (500 mg total) by mouth 2 (two) times daily. 07/07/16   Altamese DillingVachhani, Vaibhavkumar, MD  escitalopram (LEXAPRO) 20 MG tablet Take 20 mg by mouth daily.    [provider]  ibuprofen (ADVIL,MOTRIN) 600 MG tablet  Take 600 mg by mouth every 8 (eight) hours as needed for moderate pain.    [provider]  metroNIDAZOLE (FLAGYL) 250 MG tablet Take 1 tablet (250 mg total) by mouth 3 (three) times daily. 07/07/16   Altamese DillingVachhani, Vaibhavkumar, MD  ondansetron (ZOFRAN ODT) 4 MG disintegrating tablet Take 1 tablet (4 mg total) by mouth every 8 (eight) hours as needed for nausea or vomiting. 07/17/16   Don PerkingVeronese, [place], MD  oxyCODONE-acetaminophen (ROXICET) 5-325 MG tablet Take 1 tablet by mouth every 4 (four) hours as needed for severe pain. 07/17/16   Don PerkingVeronese, [place], MD  potassium chloride (KLOR-CON 10) 10 MEQ tablet Take 1 tablet (10 mEq total) by mouth daily. 07/17/16   Nita SickleVeronese, [place], MD  potassium chloride SA (K-DUR,KLOR-CON) 20 MEQ tablet Take 1 tablet (20 mEq total) by mouth daily. 07/07/16   Altamese DillingVachhani, Vaibhavkumar, MD  ranitidine (ZANTAC) 150 MG tablet Take 150 mg by mouth daily.    [provider]  triamcinolone cream (KENALOG) 0.1 % Apply 1 application topically daily as needed (rash on hands).    [provider]    Allergies  Allergen Reactions  . Penicillins Other (See Comments)    Reaction as a child, patient does not recall  Has patient had a PCN reaction causing immediate rash, facial/tongue/throat swelling, SOB or lightheadedness with hypotension: unknown Has patient had a PCN reaction causing severe rash involving mucus membranes or skin necrosis: unknown Has patient had a PCN reaction that required  hospitalization: unknown Has patient had a PCN reaction occurring within the last 10 years: No If all of the above answers are "NO", then may proceed with Cephalosporin use.  . Sulfa Antibiotics     Family History  Problem Relation Age of Onset  . Hypertension Father   . CAD Brother     Social History Social History  Substance Use Topics  . Smoking status: Never Smoker  . Smokeless tobacco: Never Used  . Alcohol use No    Review of Systems Constitutional:  Negative for fever. Eyes: Negative for visual changes. ENT: Mild congestion Cardiovascular: Negative for chest pain. Respiratory: Negative for shortness of breath. Gastrointestinal: Mild right abdominal pain. Negative nausea and vomiting or diarrhea. Genitourinary: Negative for dysuria. Positive for cloudy urine. Musculoskeletal: Moderate right lower back pain. Significant right lower extremity pain. Skin: Negative for rash. Neurological: Negative for headaches, focal weakness or numbness. All other ROS negative  ____________________________________________   PHYSICAL EXAM:  VITAL SIGNS: ED Triage Vitals  Enc Vitals Group     BP 03/10/17 0856 (!) 166/57     Pulse Rate 03/10/17 0856 62     Resp 03/10/17 0856 15     Temp 03/10/17 0856 98.9 F (37.2 C)     Temp Source 03/10/17 0856 Oral     SpO2 03/10/17 0856 98 %     Weight --      Height --      Head Circumference --      Peak Flow --      Pain Score 03/10/17 0907 9     Pain Loc --      Pain Edu? --      Excl. in GC? --     Constitutional: Alert and oriented. Well appearing and in no distress. Eyes: Normal exam ENT   Head: Normocephalic and atraumatic.   Mouth/Throat: Mucous membranes are moist. Cardiovascular: Normal rate, regular rhythm.  Respiratory: Normal respiratory effort without tachypnea nor retractions. Breath sounds are clear  Gastrointestinal: Soft, mild suprapubic tenderness palpation, otherwise abdomen is largely benign. Musculoskeletal: Right lower extremity is largely nontender to palpation, no appreciable edema. 2+ DP pulse. Sensation is intact and normal per patient. Great range of motion in all joints including the hip without increased discomfort. Patient is actively massaging her thighs saying that it is hurting. Neurologic:  Normal speech and language. No gross focal neurologic deficits Skin:  Skin is warm, dry and intact. No redness. Psychiatric: Mood and affect are normal.    ____________________________________________   RADIOLOGY  CT negative Ultrasound negative for DVT  ____________________________________________   INITIAL IMPRESSION / ASSESSMENT AND PLAN / ED COURSE  Pertinent labs & imaging results that were available during my care of the patient were reviewed by me and considered in my medical decision making (see chart for details).  Patient presents to the emergency department with right lower extremity pain which has been worsening over the past 3 days. Patient is actively massaging her thighs saying this is where it hurts. Does not appear to overlie the area of vasculature however given the increased pain we'll obtain an ultrasound to rule out DVT. Patient states the pain now appears to have moved into the right side of her abdomen. She also states cloudy urine. We will check a urinalysis, CT renal scan. Overall the patient appears well. We will dose oxycodone, Tylenol, Zofran.  CT the abdomen/pelvis is negative. Ultrasound of the right lower extremity is negative for DVT. Patient's urinalysis does show a  significant urinary tract infection, nitrite positive. We will treat with IV Levaquin given a penicillin anaphylactic allergy. Patient will be discharged with Levaquin for the next 7 days. We will also discharge with a short course of pain medication and orthopedic follow-up given continued right lower extremity pain with an otherwise negative workup.  ----------------------------------------- 2:04 PM on 03/10/2017 -----------------------------------------  Upon attempted discharge the patient now states she is also experiencing chest pain in addition to her right leg pain and abdominal pain. We'll obtain a troponin. Overall the patient continues to appear well, no acute distress.  EKG reviewed and interpreted by myself shows normal sinus rhythm at 81 bpm with a narrow QRS and normal axis, largely normal intervals besides a slight QTC  prolongation of 517 ms, nonspecific ST changes. No ST elevation.  ----------------------------------------- 5:35 PM on 03/10/2017 -----------------------------------------  Repeat troponin is negative. Patient will be discharged home with PCP follow-up. ____________________________________________   FINAL CLINICAL IMPRESSION(S) / ED DIAGNOSES  Right lower extremity pain Right flank pain Urinary tract infection   Minna Antis, MD 03/10/17 1318    Minna Antis, MD 03/10/17 1735

## 2017-03-10 NOTE — ED Triage Notes (Signed)
States right leg pain that began a few days ago, states the pain has now gone up in her abd and pt states nausea, states hx of diverticulitis, denies any urinary issues , last BM 5/8

## 2017-03-10 NOTE — ED Notes (Signed)
Pt reported central non radiating chest pain, back pain and vomiting. EKG performed and Dr. Lenard LancePaduchowski notified.

## 2017-03-10 NOTE — Discharge Instructions (Signed)
Please take your antibiotics for their entire prescribed course. Please take your pain medication as needed, as written. Return to the emergency department for any worsening pain or any other symptom personally concerning to yourself. Otherwise please follow-up with orthopedics and your primary care doctor this week.

## 2017-03-12 LAB — URINE CULTURE: Culture: >=100000 — AB

## 2017-03-16 ENCOUNTER — Emergency Department
Admission: EM | Admit: 2017-03-16 | Discharge: 2017-03-17 | Disposition: A | Discharge status: Home / Self Care | Payer: Medicare Other | Attending: Emergency Medicine | Admitting: Emergency Medicine

## 2017-03-16 DIAGNOSIS — M5441 Lumbago with sciatica, right side: Secondary | ICD-10-CM | POA: Insufficient documentation

## 2017-03-16 DIAGNOSIS — Z79899 Other long term (current) drug therapy: Secondary | ICD-10-CM | POA: Diagnosis not present

## 2017-03-16 DIAGNOSIS — M25551 Pain in right hip: Secondary | ICD-10-CM | POA: Insufficient documentation

## 2017-03-16 DIAGNOSIS — G8929 Other chronic pain: Secondary | ICD-10-CM

## 2017-03-16 DIAGNOSIS — M5431 Sciatica, right side: Secondary | ICD-10-CM

## 2017-03-16 DIAGNOSIS — I10 Essential (primary) hypertension: Secondary | ICD-10-CM | POA: Insufficient documentation

## 2017-03-16 DIAGNOSIS — M545 Low back pain: Secondary | ICD-10-CM | POA: Diagnosis present

## 2017-03-16 HISTORY — DX: Unspecified osteoarthritis, unspecified site: M19.90

## 2017-03-16 NOTE — ED Triage Notes (Signed)
Pt presents to ED via [facility] from home with c/o RIGHT-sided lower back pain with radiation down the right leg. Pt seen here last week, d/x'd with UTI and r/x'd ABX (pt reports 1 dose left for tomorrow AM).

## 2017-03-17 ENCOUNTER — Emergency Department: Payer: Medicare Other

## 2017-03-17 LAB — BASIC METABOLIC PANEL
Anion gap: 7 (ref 5–15)
BUN: 15 mg/dL (ref 6–20)
CO2: 28 mmol/L (ref 22–32)
Calcium: 8.7 mg/dL — ABNORMAL LOW (ref 8.9–10.3)
Chloride: 101 mmol/L (ref 101–111)
Creatinine, Ser: 0.98 mg/dL (ref 0.44–1.00)
GFR calc Af Amer: >60 mL/min (ref >60)
GFR calc non Af Amer: 53 mL/min — ABNORMAL LOW (ref >60)
Glucose, Bld: 103 mg/dL — ABNORMAL HIGH (ref 65–99)
Potassium: 3 mmol/L — ABNORMAL LOW (ref 3.5–5.1)
Sodium: 136 mmol/L (ref 135–145)

## 2017-03-17 LAB — URINALYSIS, COMPLETE (UACMP) WITH MICROSCOPIC
Bacteria, UA: NONE SEEN
Bilirubin Urine: NEGATIVE
Glucose, UA: NEGATIVE mg/dL
Hgb urine dipstick: NEGATIVE
Ketones, ur: NEGATIVE mg/dL
Leukocytes, UA: NEGATIVE
Nitrite: NEGATIVE
Protein, ur: NEGATIVE mg/dL
Specific Gravity, Urine: 1.005 (ref 1.005–1.030)
pH: 6 (ref 5.0–8.0)

## 2017-03-17 MED ORDER — DIAZEPAM 5 MG PO TABS: 5.0000 mg | ORAL_TABLET | Freq: Three times a day (TID) | ORAL | 0 refills | Status: DC | PRN

## 2017-03-17 MED ORDER — METOCLOPRAMIDE HCL 10 MG PO TABS
ORAL_TABLET | ORAL | Status: AC
  Filled 2017-03-17: qty 1

## 2017-03-17 MED ORDER — KETOROLAC TROMETHAMINE 30 MG/ML IJ SOLN
INTRAMUSCULAR | Status: AC
  Administered 2017-03-17: 60 mg via INTRAMUSCULAR
  Filled 2017-03-17: qty 2

## 2017-03-17 MED ORDER — KETOROLAC TROMETHAMINE 60 MG/2ML IM SOLN
60.0000 mg | Freq: Once | INTRAMUSCULAR | Status: AC
  Administered 2017-03-17: 60 mg via INTRAMUSCULAR

## 2017-03-17 MED ORDER — KETOROLAC TROMETHAMINE 30 MG/ML IJ SOLN
INTRAMUSCULAR | Status: AC
  Filled 2017-03-17: qty 2

## 2017-03-17 MED ORDER — LIDOCAINE 5 % EX PTCH
1.0000 | MEDICATED_PATCH | CUTANEOUS | Status: DC
  Administered 2017-03-17: 1 via TRANSDERMAL
  Filled 2017-03-17 (×2): qty 1

## 2017-03-17 MED ORDER — LIDOCAINE 5 % EX PTCH: 1.0000 | MEDICATED_PATCH | Freq: Two times a day (BID) | CUTANEOUS | 0 refills | Status: DC

## 2017-03-17 MED ORDER — ETODOLAC 200 MG PO CAPS: 200.0000 mg | ORAL_CAPSULE | Freq: Three times a day (TID) | ORAL | 0 refills | Status: DC

## 2017-03-17 MED ORDER — TRAMADOL HCL 50 MG PO TABS: 50.0000 mg | ORAL_TABLET | Freq: Four times a day (QID) | ORAL | 0 refills | Status: DC | PRN

## 2017-03-17 MED ORDER — ONDANSETRON 4 MG PO TBDP
4.0000 mg | ORAL_TABLET | Freq: Once | ORAL | Status: AC
  Administered 2017-03-17: 4 mg via ORAL
  Filled 2017-03-17: qty 1

## 2017-03-17 MED ORDER — KETOROLAC TROMETHAMINE 10 MG PO TABS
ORAL_TABLET | ORAL | Status: AC
  Filled 2017-03-17: qty 2

## 2017-03-17 MED ORDER — METOCLOPRAMIDE HCL 10 MG PO TABS
10.0000 mg | ORAL_TABLET | Freq: Once | ORAL | Status: AC
  Administered 2017-03-17: 10 mg via ORAL

## 2017-03-17 MED ORDER — DIAZEPAM 2 MG PO TABS
2.0000 mg | ORAL_TABLET | Freq: Once | ORAL | Status: AC
  Administered 2017-03-17: 2 mg via ORAL
  Filled 2017-03-17: qty 1

## 2017-03-17 MED ORDER — OXYCODONE-ACETAMINOPHEN 5-325 MG PO TABS
1.0000 | ORAL_TABLET | Freq: Once | ORAL | Status: AC
  Administered 2017-03-17: 1 via ORAL
  Filled 2017-03-17: qty 1

## 2017-03-17 NOTE — ED Provider Notes (Signed)
[facility] Emergency Department Provider Note   ____________________________________________   First MD Initiated Contact with Patient 03/16/17 2342     (approximate)  I have reviewed the triage vital signs and the nursing notes.   HISTORY  Chief Complaint Back Pain    HPI Marilyn Andrews is a 81 y.o. female whocomes into the hospital today with some lower back pain and right hip pain. The patient reports that this started a few months ago but it was worse last week. The patient came over here and was seen and was sent home with some medication. She reports though that the pain is gone worse since she's gotten home. She reports that she took whatever medicine was given to her as well as some ibuprofen but it has not been helping. The patient is also been rubbing Aspercreme on her leg. The patient reports that she has pain into her groin as well as into her leg. The patient reports that she had seen her primary care doctor last week prior to coming to the ER but they would not give her anything for pain. She reports that they checked her urine and just asked her questions. The patient rates her pain an 9 out of 10 in intensity. She states that she tried as hard as she could to deal with the pain but she is unable to do so at this time. She is here today for evaluation.   Past Medical History:  Diagnosis Date  . Anxiety   . Arthritis   . Diverticulitis   . Hypertension     Patient Active Problem List   Diagnosis Date Noted  . Diverticulitis 07/01/2016    Past Surgical History:  Procedure Laterality Date  . ABDOMINAL HYSTERECTOMY    . APPENDECTOMY    . CHOLECYSTECTOMY    . HYSTERECTOMY ABDOMINAL WITH SALPINGECTOMY      Prior to Admission medications   Medication Sig Start Date End Date Taking? Authorizing Provider  acetaminophen (TYLENOL) 325 MG tablet Take 650 mg by mouth every 6 (six) hours as needed for mild pain.     [provider]    amLODipine (NORVASC) 5 MG tablet Take 5 mg by mouth daily.    [provider]  ciprofloxacin (CIPRO) 500 MG tablet Take 1 tablet (500 mg total) by mouth 2 (two) times daily. 07/07/16   Altamese Dilling, MD  diazepam (VALIUM) 5 MG tablet Take 1 tablet (5 mg total) by mouth every 8 (eight) hours as needed for anxiety. 03/17/17 03/17/18  Rebecka Apley, MD  escitalopram (LEXAPRO) 20 MG tablet Take 20 mg by mouth daily.    [provider]  etodolac (LODINE) 200 MG capsule Take 1 capsule (200 mg total) by mouth every 8 (eight) hours. 03/17/17   Rebecka Apley, MD  HYDROcodone-acetaminophen (NORCO/VICODIN) 5-325 MG tablet Take 1 tablet by mouth every 4 (four) hours as needed for moderate pain. 03/10/17 03/10/18  Minna Antis, MD  ibuprofen (ADVIL,MOTRIN) 600 MG tablet Take 600 mg by mouth every 8 (eight) hours as needed for moderate pain.    [provider]  levofloxacin (LEVAQUIN) 500 MG tablet Take 1 tablet (500 mg total) by mouth daily. 03/10/17 03/17/17  Minna Antis, MD  lidocaine (LIDODERM) 5 % Place 1 patch onto the skin every 12 (twelve) hours. Remove & Discard patch within 12 hours or as directed by MD 03/17/17 03/17/18  Rebecka Apley, MD  metroNIDAZOLE (FLAGYL) 250 MG tablet Take 1 tablet (250  mg total) by mouth 3 (three) times daily. 07/07/16   Altamese DillingVachhani, Vaibhavkumar, MD  ondansetron (ZOFRAN ODT) 4 MG disintegrating tablet Take 1 tablet (4 mg total) by mouth every 8 (eight) hours as needed for nausea or vomiting. 03/10/17   Minna AntisPaduchowski, Kevin, MD  oxyCODONE-acetaminophen (ROXICET) 5-325 MG tablet Take 1 tablet by mouth every 4 (four) hours as needed for severe pain. 07/17/16   Don PerkingVeronese, [place], MD  potassium chloride (KLOR-CON 10) 10 MEQ tablet Take 1 tablet (10 mEq total) by mouth daily. 07/17/16   Nita SickleVeronese, [place], MD  potassium chloride SA (K-DUR,KLOR-CON) 20 MEQ tablet Take 1 tablet (20 mEq total) by mouth daily. 07/07/16   Altamese DillingVachhani, Vaibhavkumar, MD   ranitidine (ZANTAC) 150 MG tablet Take 150 mg by mouth daily.    [provider]  traMADol (ULTRAM) 50 MG tablet Take 1 tablet (50 mg total) by mouth every 6 (six) hours as needed. 03/17/17   Rebecka ApleyWebster, Lauriana Denes P, MD  triamcinolone cream (KENALOG) 0.1 % Apply 1 application topically daily as needed (rash on hands).    [provider]    Allergies Penicillins and Sulfa antibiotics  Family History  Problem Relation Age of Onset  . Hypertension Father   . CAD Brother     Social History Social History  Substance Use Topics  . Smoking status: Never Smoker  . Smokeless tobacco: Never Used  . Alcohol use No    Review of Systems  Constitutional: No fever/chills Eyes: No visual changes. ENT: No sore throat. Cardiovascular: Denies chest pain. Respiratory: Denies shortness of breath. Gastrointestinal: No abdominal pain.  No nausea, no vomiting.  No diarrhea.  No constipation. Genitourinary: Negative for dysuria. Musculoskeletal: back pain, right leg pain Skin: Negative for rash. Neurological: Negative for headaches, focal weakness or numbness.   ____________________________________________   PHYSICAL EXAM:  VITAL SIGNS: ED Triage Vitals  Enc Vitals Group     BP 03/16/17 2340 98/82     Pulse Rate 03/16/17 2340 64     Resp 03/16/17 2340 18     Temp 03/16/17 2340 98.2 F (36.8 C)     Temp Source 03/16/17 2340 Oral     SpO2 03/16/17 2340 98 %     Weight 03/16/17 2341 183 lb (83 kg)     Height 03/16/17 2341 5' (1.524 m)     Head Circumference --      Peak Flow --      Pain Score 03/16/17 2339 9     Pain Loc --      Pain Edu? --      Excl. in GC? --    Constitutional: Alert and oriented. Well appearing and in Moderate distress. Eyes: Conjunctivae are normal. PERRL. EOMI. Head: Atraumatic. Nose: No congestion/rhinnorhea. Mouth/Throat: Mucous membranes are moist.  Oropharynx non-erythematous. Cardiovascular: Normal rate, regular rhythm. Grossly normal  heart sounds.  Good peripheral circulation. Respiratory: Normal respiratory effort.  No retractions. Lungs CTAB. Gastrointestinal: Soft and nontender. No distention. Positive bowel sounds Musculoskeletal: Tenderness to palpation over right SI joint and positive straight leg raise  Neurologic:  Normal speech and language.  Skin:  Skin is warm, dry and intact.  Psychiatric: Mood and affect are normal.   ____________________________________________   LABS (all labs ordered are listed, but only abnormal results are displayed)  Labs Reviewed  URINALYSIS, COMPLETE (UACMP) WITH MICROSCOPIC - Abnormal; Notable for the following:       Result Value   Color, Urine STRAW (*)    APPearance CLEAR (*)  Squamous Epithelial / LPF 0-5 (*)    All other components within normal limits  BASIC METABOLIC PANEL - Abnormal; Notable for the following:    Potassium 3.0 (*)    Glucose, Bld 103 (*)    Calcium 8.7 (*)    GFR calc non Af Amer 53 (*)    All other components within normal limits   ____________________________________________  EKG  none ____________________________________________  RADIOLOGY  DG right hip xray ____________________________________________   PROCEDURES  Procedure(s) performed: None  Procedures  Critical Care performed: No  ____________________________________________   INITIAL IMPRESSION / ASSESSMENT AND PLAN / ED COURSE  Pertinent labs & imaging results that were available during my care of the patient were reviewed by me and considered in my medical decision making (see chart for details).  This is a 81 year old female who comes into the hospital today with some right leg pain as well as some back pain. The patient does have tenderness to palpation over SI joint as well as some positive straight leg raise. The patient had a CT scan when she was seen here yesterday with no aneurysm. The patient also had a UTI when she was seen here last week. I will give the  patient a letter patch, Toradol and Valium. I will send the patient for an x-ray to look for arthritis in her hip and I will recheck the patient's urinalysis. We'll reassess the patient.  Clinical Course as of Mar 18 507  Wed Mar 17, 2017  0126 No fracture or dislocation of the right hip.  Osteopenia. DG Hip Unilat W or Wo Pelvis 1 View Right [AW]    Clinical Course User Index [AW] Rebecka Apley, MD   The patient's x-ray is unremarkable. I did give the patient is well up Percocet and some Zofran and Reglan. The patient was saying that the last time she was here she received morphine but I informed her that since she did not have any acute injury or acute fracture and did not feel it appropriate to give her narcotic pain medicines. The patient was very frustrated but I did encourage her that she should follow up with orthopedics for them to further evaluate and possibly do an injection in her hip. The patient has had this pain for months. She will be discharged to home to follow-up with orthopedic surgery.  ____________________________________________   FINAL CLINICAL IMPRESSION(S) / ED DIAGNOSES  Final diagnoses:  Sciatica of right side  Chronic right-sided low back pain with right-sided sciatica      NEW MEDICATIONS STARTED DURING THIS VISIT:  New Prescriptions   DIAZEPAM (VALIUM) 5 MG TABLET    Take 1 tablet (5 mg total) by mouth every 8 (eight) hours as needed for anxiety.   ETODOLAC (LODINE) 200 MG CAPSULE    Take 1 capsule (200 mg total) by mouth every 8 (eight) hours.   LIDOCAINE (LIDODERM) 5 %    Place 1 patch onto the skin every 12 (twelve) hours. Remove & Discard patch within 12 hours or as directed by MD   TRAMADOL (ULTRAM) 50 MG TABLET    Take 1 tablet (50 mg total) by mouth every 6 (six) hours as needed.     Note:  This document was prepared using Dragon voice recognition software and may include unintentional dictation errors.    Rebecka Apley, MD 03/17/17  [phone]

## 2017-03-17 NOTE — Discharge Instructions (Signed)
Please follow up with orthopedics for further evaluation of this pain

## 2017-04-21 ENCOUNTER — Emergency Department: Payer: Medicare Other

## 2017-04-21 ENCOUNTER — Emergency Department
Admission: EM | Admit: 2017-04-21 | Discharge: 2017-04-21 | Disposition: A | Discharge status: Home / Self Care | Payer: Medicare Other | Attending: Emergency Medicine | Admitting: Emergency Medicine

## 2017-04-21 ENCOUNTER — Encounter: Payer: Self-pay | Admitting: *Deleted

## 2017-04-21 DIAGNOSIS — N3001 Acute cystitis with hematuria: Secondary | ICD-10-CM | POA: Insufficient documentation

## 2017-04-21 DIAGNOSIS — I10 Essential (primary) hypertension: Secondary | ICD-10-CM | POA: Insufficient documentation

## 2017-04-21 DIAGNOSIS — R51 Headache: Secondary | ICD-10-CM | POA: Insufficient documentation

## 2017-04-21 DIAGNOSIS — R1084 Generalized abdominal pain: Secondary | ICD-10-CM | POA: Diagnosis not present

## 2017-04-21 DIAGNOSIS — K5790 Diverticulosis of intestine, part unspecified, without perforation or abscess without bleeding: Secondary | ICD-10-CM | POA: Diagnosis not present

## 2017-04-21 DIAGNOSIS — R110 Nausea: Secondary | ICD-10-CM | POA: Insufficient documentation

## 2017-04-21 DIAGNOSIS — Z79899 Other long term (current) drug therapy: Secondary | ICD-10-CM | POA: Diagnosis not present

## 2017-04-21 DIAGNOSIS — R519 Headache, unspecified: Secondary | ICD-10-CM

## 2017-04-21 LAB — BASIC METABOLIC PANEL
Anion gap: 10 (ref 5–15)
BUN: 13 mg/dL (ref 6–20)
CO2: 24 mmol/L (ref 22–32)
Calcium: 9.6 mg/dL (ref 8.9–10.3)
Chloride: 106 mmol/L (ref 101–111)
Creatinine, Ser: 1.01 mg/dL — ABNORMAL HIGH (ref 0.44–1.00)
GFR calc Af Amer: 60 mL/min — ABNORMAL LOW (ref >60)
GFR calc non Af Amer: 52 mL/min — ABNORMAL LOW (ref >60)
Glucose, Bld: 132 mg/dL — ABNORMAL HIGH (ref 65–99)
Potassium: 3.5 mmol/L (ref 3.5–5.1)
Sodium: 140 mmol/L (ref 135–145)

## 2017-04-21 LAB — CBC WITH DIFFERENTIAL/PLATELET
Basophils Absolute: 0.1 10*3/uL (ref 0–0.1)
Basophils Relative: 1 %
Eosinophils Absolute: 0 10*3/uL (ref 0–0.7)
Eosinophils Relative: 0 %
HCT: 42.9 % (ref 35.0–47.0)
Hemoglobin: 14.5 g/dL (ref 12.0–16.0)
Lymphocytes Relative: 5 %
Lymphs Abs: 0.7 10*3/uL — ABNORMAL LOW (ref 1.0–3.6)
MCH: 28.7 pg (ref 26.0–34.0)
MCHC: 33.7 g/dL (ref 32.0–36.0)
MCV: 85.4 fL (ref 80.0–100.0)
Monocytes Absolute: 0.4 10*3/uL (ref 0.2–0.9)
Monocytes Relative: 3 %
Neutro Abs: 11.5 10*3/uL — ABNORMAL HIGH (ref 1.4–6.5)
Neutrophils Relative %: 91 %
Platelets: 351 10*3/uL (ref 150–440)
RBC: 5.03 MIL/uL (ref 3.80–5.20)
RDW: 13.6 % (ref 11.5–14.5)
WBC: 12.6 10*3/uL — ABNORMAL HIGH (ref 3.6–11.0)

## 2017-04-21 LAB — URINALYSIS, COMPLETE (UACMP) WITH MICROSCOPIC
Bacteria, UA: NONE SEEN
Bilirubin Urine: NEGATIVE
Glucose, UA: NEGATIVE mg/dL
Ketones, ur: 15 mg/dL — AB
Nitrite: NEGATIVE
Protein, ur: 30 mg/dL — AB
Specific Gravity, Urine: 1.015 (ref 1.005–1.030)
pH: 7.5 (ref 5.0–8.0)

## 2017-04-21 LAB — TROPONIN I: Troponin I: <0.03 ng/mL (ref <0.03)

## 2017-04-21 LAB — HEPATIC FUNCTION PANEL
ALT: 19 U/L (ref 14–54)
AST: 35 U/L (ref 15–41)
Albumin: 3.5 g/dL (ref 3.5–5.0)
Alkaline Phosphatase: 60 U/L (ref 38–126)
Bilirubin, Direct: 0.2 mg/dL (ref 0.1–0.5)
Indirect Bilirubin: 0.7 mg/dL (ref 0.3–0.9)
Total Bilirubin: 0.9 mg/dL (ref 0.3–1.2)
Total Protein: 7 g/dL (ref 6.5–8.1)

## 2017-04-21 LAB — LIPASE, BLOOD: Lipase: 16 U/L (ref 11–51)

## 2017-04-21 MED ORDER — DIPHENHYDRAMINE HCL 50 MG/ML IJ SOLN
12.5000 mg | Freq: Once | INTRAMUSCULAR | Status: AC
  Administered 2017-04-21: 12.5 mg via INTRAVENOUS
  Filled 2017-04-21: qty 1

## 2017-04-21 MED ORDER — SODIUM CHLORIDE 0.9 % IV BOLUS (SEPSIS)
1000.0000 mL | Freq: Once | INTRAVENOUS | Status: AC
  Administered 2017-04-21: 1000 mL via INTRAVENOUS

## 2017-04-21 MED ORDER — ACETAMINOPHEN 500 MG PO TABS
1000.0000 mg | ORAL_TABLET | Freq: Once | ORAL | Status: AC
  Administered 2017-04-21: 1000 mg via ORAL
  Filled 2017-04-21: qty 2

## 2017-04-21 MED ORDER — ONDANSETRON 4 MG PO TBDP
4.0000 mg | ORAL_TABLET | Freq: Three times a day (TID) | ORAL | 0 refills | Status: DC | PRN
Start: 2017-04-21 — End: 2018-03-15

## 2017-04-21 MED ORDER — MORPHINE SULFATE (PF) 2 MG/ML IV SOLN
2.0000 mg | Freq: Once | INTRAVENOUS | Status: AC
  Administered 2017-04-21: 2 mg via INTRAVENOUS
  Filled 2017-04-21: qty 1

## 2017-04-21 MED ORDER — ACETAMINOPHEN 500 MG PO TABS: 1000.0000 mg | ORAL_TABLET | Freq: Once | ORAL | Status: DC

## 2017-04-21 MED ORDER — CEPHALEXIN 500 MG PO CAPS: 500.0000 mg | ORAL_CAPSULE | Freq: Three times a day (TID) | ORAL | 0 refills | Status: AC

## 2017-04-21 MED ORDER — IOPAMIDOL (ISOVUE-300) INJECTION 61%
100.0000 mL | Freq: Once | INTRAVENOUS | Status: AC | PRN
  Administered 2017-04-21: 100 mL via INTRAVENOUS

## 2017-04-21 MED ORDER — METOCLOPRAMIDE HCL 5 MG/ML IJ SOLN
10.0000 mg | Freq: Once | INTRAMUSCULAR | Status: AC
  Administered 2017-04-21: 10 mg via INTRAVENOUS
  Filled 2017-04-21: qty 2

## 2017-04-21 MED ORDER — ONDANSETRON HCL 4 MG/2ML IJ SOLN
INTRAMUSCULAR | Status: AC
  Filled 2017-04-21: qty 2

## 2017-04-21 MED ORDER — ONDANSETRON 4 MG PO TBDP
ORAL_TABLET | ORAL | Status: AC
  Filled 2017-04-21: qty 1

## 2017-04-21 MED ORDER — DEXTROSE 5 % IV SOLN
1.0000 g | Freq: Once | INTRAVENOUS | Status: AC
  Administered 2017-04-21: 1 g via INTRAVENOUS
  Filled 2017-04-21: qty 10

## 2017-04-21 MED ORDER — ONDANSETRON HCL 4 MG/2ML IJ SOLN
4.0000 mg | Freq: Once | INTRAMUSCULAR | Status: AC
  Administered 2017-04-21: 4 mg via INTRAVENOUS

## 2017-04-21 MED ORDER — LORAZEPAM 2 MG/ML IJ SOLN
0.5000 mg | Freq: Once | INTRAMUSCULAR | Status: AC
  Administered 2017-04-21: 0.5 mg via INTRAVENOUS
  Filled 2017-04-21: qty 1

## 2017-04-21 MED ORDER — ONDANSETRON HCL 4 MG/2ML IJ SOLN
4.0000 mg | Freq: Once | INTRAMUSCULAR | Status: AC
  Administered 2017-04-21: 4 mg via INTRAVENOUS
  Filled 2017-04-21: qty 2

## 2017-04-21 NOTE — ED Provider Notes (Signed)
[facility] Emergency Department Provider Note  ____________________________________________  Time seen: Approximately 8:27 AM  I have reviewed the triage vital signs and the nursing notes.   HISTORY  Chief Complaint Nausea   HPI Cleotis Nippereggy M Currier is a 81 y.o. female with a history of hypertension, diverticulitis, IBS, gastritis who presents for evaluation of nausea. Patient reports 2-3 days of left-sided abdominal pain that she describes as mild, dull, constant. She reports that the pain is similar to her prior bouts of diverticulitis. Yesterday afternoon she started with nausea and has had a few episodes of nonbloody nonbilious emesis. No constipation or diarrhea, no dysuria or hematuria. Patient also reports that yesterday she received cortisone shots in her lower back which has made her nausea worse. She also has a throbbing generalized severe headache that has been going on since yesterday afternoon. No thunderclap headache, no neck stiffness, no fever or chills. Patient denies back pain, saddle anesthesia, weakness or numbness of her extremities, urinary or bowel incontinence or retention.  Past Medical History:  Diagnosis Date  . Anxiety   . Arthritis   . Diverticulitis   . Hypertension     Patient Active Problem List   Diagnosis Date Noted  . Diverticulitis 07/01/2016    Past Surgical History:  Procedure Laterality Date  . ABDOMINAL HYSTERECTOMY    . APPENDECTOMY    . CHOLECYSTECTOMY    . HYSTERECTOMY ABDOMINAL WITH SALPINGECTOMY      Prior to Admission medications   Medication Sig Start Date End Date Taking? Authorizing Provider  amLODipine (NORVASC) 5 MG tablet Take 5 mg by mouth daily.   Yes [provider]  escitalopram (LEXAPRO) 20 MG tablet Take 20 mg by mouth daily.   Yes [provider]  fluticasone (FLONASE) 50 MCG/ACT nasal spray Place 2 sprays into both nostrils 2 (two) times daily as needed for allergies or  rhinitis.    Yes [provider]  ibuprofen (ADVIL,MOTRIN) 600 MG tablet Take 600 mg by mouth every 8 (eight) hours as needed for moderate pain.   Yes [provider]  ondansetron (ZOFRAN) 4 MG tablet Take 4 mg by mouth every 6 (six) hours as needed for nausea or vomiting.  04/15/17  Yes [provider]  oxyCODONE (OXY IR/ROXICODONE) 5 MG immediate release tablet Take 5 mg by mouth every 4 (four) hours as needed for moderate pain or severe pain.  04/15/17  Yes [provider]  ranitidine (ZANTAC) 150 MG tablet Take 150 mg by mouth daily.   Yes [provider]  triamcinolone cream (KENALOG) 0.1 % Apply 1 application topically daily as needed. Rash on hands   Yes [provider]  cephALEXin (KEFLEX) 500 MG capsule Take 1 capsule (500 mg total) by mouth 3 (three) times daily. 04/21/17 04/28/17  Nita SickleVeronese, [place], MD  diazepam (VALIUM) 5 MG tablet Take 1 tablet (5 mg total) by mouth every 8 (eight) hours as needed for anxiety. Patient not taking: Reported on 04/21/2017 03/17/17 03/17/18  Rebecka ApleyWebster, Allison P, MD  etodolac (LODINE) 200 MG capsule Take 1 capsule (200 mg total) by mouth every 8 (eight) hours. Patient not taking: Reported on 04/21/2017 03/17/17   Rebecka ApleyWebster, Allison P, MD  HYDROcodone-acetaminophen (NORCO/VICODIN) 5-325 MG tablet Take 1 tablet by mouth every 4 (four) hours as needed for moderate pain. Patient not taking: Reported on 04/21/2017 03/10/17 03/10/18  Minna AntisPaduchowski, Kevin, MD  lidocaine (LIDODERM) 5 % Place 1 patch onto the skin every 12 (twelve) hours. Remove &  Discard patch within 12 hours or as directed by MD Patient not taking: Reported on 04/21/2017 03/17/17 03/17/18  Rebecka Apley, MD  ondansetron (ZOFRAN ODT) 4 MG disintegrating tablet Take 1 tablet (4 mg total) by mouth every 8 (eight) hours as needed for nausea or vomiting. 04/21/17   Don Perking, [place], MD  traMADol (ULTRAM) 50 MG tablet Take 1 tablet (50 mg total) by mouth every 6  (six) hours as needed. Patient not taking: Reported on 04/21/2017 03/17/17   Rebecka Apley, MD    Allergies Penicillins and Sulfa antibiotics  Family History  Problem Relation Age of Onset  . Hypertension Father   . CAD Brother     Social History Social History  Substance Use Topics  . Smoking status: Never Smoker  . Smokeless tobacco: Never Used  . Alcohol use No    Review of Systems  Constitutional: Negative for fever. Eyes: Negative for visual changes. ENT: Negative for sore throat. Neck: No neck pain  Cardiovascular: Negative for chest pain. Respiratory: Negative for shortness of breath. Gastrointestinal: + left sided abdominal pain, nausea, and vomiting. No diarrhea. Genitourinary: Negative for dysuria. Musculoskeletal: Negative for back pain. Skin: Negative for rash. Neurological: Negative for weakness or numbness. + HA Psych: No SI or HI  ____________________________________________   PHYSICAL EXAM:  VITAL SIGNS: ED Triage Vitals  Enc Vitals Group     BP 04/21/17 0722 100/65     Pulse Rate 04/21/17 0722 66     Resp 04/21/17 0722 18     Temp 04/21/17 0722 98.6 F (37 C)     Temp Source 04/21/17 0722 Oral     SpO2 04/21/17 0722 98 %     Weight 04/21/17 0723 180 lb (81.6 kg)     Height 04/21/17 0723 5' (1.524 m)     Head Circumference --      Peak Flow --      Pain Score 04/21/17 0722 10     Pain Loc --      Pain Edu? --      Excl. in GC? --     Constitutional: Alert and oriented. Well appearing and in no apparent distress. HEENT:      Head: Normocephalic and atraumatic.         Eyes: Conjunctivae are normal. Sclera is non-icteric.       Mouth/Throat: Mucous membranes are moist.       Neck: Supple with no signs of meningismus. Cardiovascular: Regular rate and rhythm. No murmurs, gallops, or rubs. 2+ symmetrical distal pulses are present in all extremities. No JVD. Respiratory: Normal respiratory effort. Lungs are clear to auscultation  bilaterally. No wheezes, crackles, or rhonchi.  Gastrointestinal: Soft, ttp over the L quadrants, non distended with positive bowel sounds. No rebound or guarding. Genitourinary: No CVA tenderness. Musculoskeletal: Nontender with normal range of motion in all extremities. No edema, cyanosis, or erythema of extremities. Neurologic: Normal speech and language. A & O x3, PERRL, no nystagmus, CN II-XII intact, motor testing reveals good tone and bulk throughout. There is no evidence of pronator drift or dysmetria. Muscle strength is 5/5 throughout. Sensory examination is intact. Gait deferred Skin: Skin is warm, dry and intact. No rash noted. Psychiatric: Mood and affect are normal. Speech and behavior are normal.  ____________________________________________   LABS (all labs ordered are listed, but only abnormal results are displayed)  Labs Reviewed  URINE CULTURE - Abnormal; Notable for the following:       Result Value  Culture MULTIPLE SPECIES PRESENT, SUGGEST RECOLLECTION (*)    All other components within normal limits  CBC WITH DIFFERENTIAL/PLATELET - Abnormal; Notable for the following:    WBC 12.6 (*)    Neutro Abs 11.5 (*)    Lymphs Abs 0.7 (*)    All other components within normal limits  BASIC METABOLIC PANEL - Abnormal; Notable for the following:    Glucose, Bld 132 (*)    Creatinine, Ser 1.01 (*)    GFR calc non Af Amer 52 (*)    GFR calc Af Amer 60 (*)    All other components within normal limits  URINALYSIS, COMPLETE (UACMP) WITH MICROSCOPIC - Abnormal; Notable for the following:    Hgb urine dipstick MODERATE (*)    Ketones, ur 15 (*)    Protein, ur 30 (*)    Leukocytes, UA SMALL (*)    Squamous Epithelial / LPF 6-30 (*)    All other components within normal limits  TROPONIN I  HEPATIC FUNCTION PANEL  LIPASE, BLOOD   ____________________________________________  EKG  ED ECG REPORT I, Nita Sickle, the attending physician, personally viewed and  interpreted this ECG.  Normal sinus rhythm, rate of 65, normal intervals, normal axis, no ST elevations or depressions. Flattening T waves in the inferior and anterior leads. No significant changes when compared to prior   ____________________________________________  RADIOLOGY  CT a/p: Mild chronic ischemic white matter disease.  Curvilinear high density seen in the left occipital lobe concerning for possible vascular malformation; further evaluation with MRI with and without CT is recommended. ____________________________________________   PROCEDURES  Procedure(s) performed: None Procedures Critical Care performed:  None ____________________________________________   INITIAL IMPRESSION / ASSESSMENT AND PLAN / ED COURSE  81 y.o. female with a history of hypertension, diverticulitis, IBS, gastritis who presents for evaluation of nausea, 3 days of left sided abdominal pain and 2 days of HA. Patient is well-appearing, in no distress, normal vital signs, she is neurologically intact, no meningeal signs, no fever. No clinical suspicion for more serious etiology such as meningitis, stroke, or head bleed. Abdomen is tender to palpation in the left lower quadrant concerning for diverticulitis versus kidney stone versus pyelonephritis. No signs or symptoms of SBO at this time. Patient has mild leukocytosis with white count of 12.6. Also seem slightly dehydrated with a creatinine of 1.01. Blood glucose slightly elevated at 132 consistent with cortisone shots received yesterday. Normal anion gap. We'll send patient for CT abdomen and pelvis. We'll treat her symptoms with morphine, fluids,  Clinical Course as of Apr 24 1144  Wed Apr 21, 2017  0856 CT a/p with no acute findings. Patient continues to have nausea and vomiting and complain of HA therefore will send for head CT to rule out intracranial pathology. Mild leukocytosis and hyperglycemia consistent with steroid injection. No other acute  findings.  [CV]  1328 UA concerning for UTI with mild leukocytosis white count of 12.6. Patient was given Rocephin and will be discharged home on Keflex. CT abdomen and pelvis with no acute findings. CT head with an abnormal finding and recommended MRI to rule out a arteriovenous malformation. MRI was attempted and patient was given Ativan however patient refused the study at this time. I explained to her that if this is a malformation it can lead to brain bleed which can cause death or severe neurological disability. The patient will follow-up with her primary care doctor for further evaluation of this problem. Her headache is resolved. Her abdominal pain is  resolved. She continues to endorse mild nausea but is tolerating by mouth. She is going to be given a prescription for Zofran. Recommended close follow-up with PCP by in the next 1-2 days.  [CV]    Clinical Course User Index [CV] Nita Sickle, MD    Pertinent labs & imaging results that were available during my care of the patient were reviewed by me and considered in my medical decision making (see chart for details).    ____________________________________________   FINAL CLINICAL IMPRESSION(S) / ED DIAGNOSES  Final diagnoses:  Nausea  Acute nonintractable headache, unspecified headache type  Generalized abdominal pain  Acute cystitis with hematuria      NEW MEDICATIONS STARTED DURING THIS VISIT:  Discharge Medication List as of 04/21/2017  1:28 PM    START taking these medications   Details  cephALEXin (KEFLEX) 500 MG capsule Take 1 capsule (500 mg total) by mouth 3 (three) times daily., Starting Wed 04/21/2017, Until Wed 04/28/2017, Print    ondansetron (ZOFRAN ODT) 4 MG disintegrating tablet Take 1 tablet (4 mg total) by mouth every 8 (eight) hours as needed for nausea or vomiting., Starting Wed 04/21/2017, Print         Note:  This document was prepared using Dragon voice recognition software and may include  unintentional dictation errors.    Don Perking, [place], MD 04/24/17 1145

## 2017-04-21 NOTE — ED Notes (Signed)
Pt returned from MRI, pt states she could not tolerate the machine and did nto want any more medication, Dr. Don PerkingVeronese made aware

## 2017-04-21 NOTE — ED Notes (Signed)
Upon reviewing discharge pt continues to state "I dont understand", when asked what she dosent understand pt states "I dont know", discharge instructions return precautions reviewed in detail with pt, pt continues to ask for pain medication

## 2017-04-21 NOTE — ED Notes (Signed)
Pt returned from CT, pain meds given, pt requested nausea meds, orders received, after morphine pt state she felt sob, o2 placed on pt, vitals WDL, will continue to monitor

## 2017-04-21 NOTE — ED Notes (Signed)
Patient transported to CT 

## 2017-04-21 NOTE — Discharge Instructions (Signed)
As I explained to you, your head CT showed an abnormality that needs to be re-evaluated with a MRI. We attempted to perform an MRI here but you refused it. Make sure to follow up with your doctor for further evaluation. If this is an abnormal blood vessel it can rupture causing brain bleed, death, or severe neurological dysfunction. If you develop a sudden severe headache, slurred speech, facial droop, unilateral weakness or numbness please return to the emergency room emergently for evaluation

## 2017-04-21 NOTE — ED Notes (Signed)
Pt awaiting ride, states she called her daughter in law who will be here in an hour

## 2017-04-21 NOTE — ED Notes (Signed)
Pt resting in bed, pt talking to MRI

## 2017-04-21 NOTE — ED Notes (Signed)
Patient transported to MRI 

## 2017-04-21 NOTE — ED Notes (Signed)
EDP at bedside  

## 2017-04-21 NOTE — ED Notes (Signed)
Pt given PO challenge, gingerale and crackers

## 2017-04-21 NOTE — ED Triage Notes (Signed)
Pt arrives via EMS from home, pt states she had a cortisone injection in her back yesterday and the developed nausea and a headache, pts roommate told EMS she has been sick for 3 days,  reports taking tylenol and zofran pta, pt also states headache, awake and alert

## 2017-04-22 LAB — URINE CULTURE

## 2018-03-07 ENCOUNTER — Other Ambulatory Visit: Payer: Self-pay

## 2018-03-07 ENCOUNTER — Inpatient Hospital Stay
Admission: EM | Admit: 2018-03-07 | Discharge: 2018-03-15 | DRG: 392 | Disposition: A | Discharge status: Skilled Nursing Facility | Payer: Medicare Other | Attending: Internal Medicine | Admitting: Internal Medicine

## 2018-03-07 ENCOUNTER — Emergency Department: Payer: Medicare Other

## 2018-03-07 DIAGNOSIS — K5792 Diverticulitis of intestine, part unspecified, without perforation or abscess without bleeding: Secondary | ICD-10-CM

## 2018-03-07 DIAGNOSIS — F332 Major depressive disorder, recurrent severe without psychotic features: Secondary | ICD-10-CM | POA: Diagnosis present

## 2018-03-07 DIAGNOSIS — Z79899 Other long term (current) drug therapy: Secondary | ICD-10-CM

## 2018-03-07 DIAGNOSIS — Z66 Do not resuscitate: Secondary | ICD-10-CM | POA: Diagnosis present

## 2018-03-07 DIAGNOSIS — K2900 Acute gastritis without bleeding: Secondary | ICD-10-CM | POA: Diagnosis present

## 2018-03-07 DIAGNOSIS — I10 Essential (primary) hypertension: Secondary | ICD-10-CM | POA: Diagnosis present

## 2018-03-07 DIAGNOSIS — F419 Anxiety disorder, unspecified: Secondary | ICD-10-CM | POA: Diagnosis present

## 2018-03-07 DIAGNOSIS — K5732 Diverticulitis of large intestine without perforation or abscess without bleeding: Secondary | ICD-10-CM | POA: Diagnosis not present

## 2018-03-07 HISTORY — DX: Diverticulitis of intestine, part unspecified, without perforation or abscess without bleeding: K57.92

## 2018-03-07 LAB — COMPREHENSIVE METABOLIC PANEL
ALT: 9 U/L — ABNORMAL LOW (ref 14–54)
AST: 21 U/L (ref 15–41)
Albumin: 3.6 g/dL (ref 3.5–5.0)
Alkaline Phosphatase: 91 U/L (ref 38–126)
Anion gap: 8 (ref 5–15)
BUN: 11 mg/dL (ref 6–20)
CO2: 27 mmol/L (ref 22–32)
Calcium: 8.4 mg/dL — ABNORMAL LOW (ref 8.9–10.3)
Chloride: 103 mmol/L (ref 101–111)
Creatinine, Ser: 1.1 mg/dL — ABNORMAL HIGH (ref 0.44–1.00)
GFR calc Af Amer: 53 mL/min — ABNORMAL LOW (ref >60)
GFR calc non Af Amer: 46 mL/min — ABNORMAL LOW (ref >60)
Glucose, Bld: 105 mg/dL — ABNORMAL HIGH (ref 65–99)
Potassium: 3.7 mmol/L (ref 3.5–5.1)
Sodium: 138 mmol/L (ref 135–145)
Total Bilirubin: 0.8 mg/dL (ref 0.3–1.2)
Total Protein: 7.3 g/dL (ref 6.5–8.1)

## 2018-03-07 LAB — CBC WITH DIFFERENTIAL/PLATELET
Basophils Absolute: 0 10*3/uL (ref 0–0.1)
Basophils Relative: 0 %
Eosinophils Absolute: 0.1 10*3/uL (ref 0–0.7)
Eosinophils Relative: 0 %
HCT: 45.6 % (ref 35.0–47.0)
Hemoglobin: 15.3 g/dL (ref 12.0–16.0)
Lymphocytes Relative: 6 %
Lymphs Abs: 0.8 10*3/uL — ABNORMAL LOW (ref 1.0–3.6)
MCH: 28.3 pg (ref 26.0–34.0)
MCHC: 33.5 g/dL (ref 32.0–36.0)
MCV: 84.5 fL (ref 80.0–100.0)
Monocytes Absolute: 0.6 10*3/uL (ref 0.2–0.9)
Monocytes Relative: 5 %
Neutro Abs: 11.6 10*3/uL — ABNORMAL HIGH (ref 1.4–6.5)
Neutrophils Relative %: 89 %
Platelets: 383 10*3/uL (ref 150–440)
RBC: 5.4 MIL/uL — ABNORMAL HIGH (ref 3.80–5.20)
RDW: 14.4 % (ref 11.5–14.5)
WBC: 13.1 10*3/uL — ABNORMAL HIGH (ref 3.6–11.0)

## 2018-03-07 LAB — LIPASE, BLOOD: Lipase: 25 U/L (ref 11–51)

## 2018-03-07 MED ORDER — METRONIDAZOLE 500 MG PO TABS
500.0000 mg | ORAL_TABLET | Freq: Three times a day (TID) | ORAL | Status: DC
  Administered 2018-03-07 – 2018-03-10 (×8): 500 mg via ORAL
  Filled 2018-03-07 (×8): qty 1

## 2018-03-07 MED ORDER — ALBUTEROL SULFATE (2.5 MG/3ML) 0.083% IN NEBU: 2.5000 mg | INHALATION_SOLUTION | RESPIRATORY_TRACT | Status: DC | PRN

## 2018-03-07 MED ORDER — METRONIDAZOLE IN NACL 5-0.79 MG/ML-% IV SOLN
500.0000 mg | Freq: Once | INTRAVENOUS | Status: AC
  Administered 2018-03-07: 500 mg via INTRAVENOUS
  Filled 2018-03-07: qty 100

## 2018-03-07 MED ORDER — POLYETHYLENE GLYCOL 3350 17 G PO PACK: 17.0000 g | PACK | Freq: Every day | ORAL | Status: DC | PRN

## 2018-03-07 MED ORDER — MORPHINE SULFATE (PF) 4 MG/ML IV SOLN
4.0000 mg | Freq: Once | INTRAVENOUS | Status: AC
  Administered 2018-03-07: 4 mg via INTRAVENOUS

## 2018-03-07 MED ORDER — HYDROCODONE-ACETAMINOPHEN 5-325 MG PO TABS
1.0000 | ORAL_TABLET | ORAL | Status: DC | PRN
  Administered 2018-03-09: 15:00:00 2 via ORAL
  Administered 2018-03-09 (×2): 1 via ORAL
  Administered 2018-03-10: 2 via ORAL
  Administered 2018-03-10: 02:00:00 1 via ORAL
  Administered 2018-03-10 – 2018-03-11 (×5): 2 via ORAL
  Administered 2018-03-11: 01:00:00 1 via ORAL
  Administered 2018-03-12 (×4): 2 via ORAL
  Administered 2018-03-13 – 2018-03-15 (×3): 1 via ORAL
  Administered 2018-03-15: 2 via ORAL
  Filled 2018-03-07 (×2): qty 2
  Filled 2018-03-07: qty 1
  Filled 2018-03-07 (×6): qty 2
  Filled 2018-03-07: qty 1
  Filled 2018-03-07 (×2): qty 2
  Filled 2018-03-07: qty 1
  Filled 2018-03-07: qty 2
  Filled 2018-03-07 (×2): qty 1
  Filled 2018-03-07: qty 2
  Filled 2018-03-07: qty 1
  Filled 2018-03-07: qty 2

## 2018-03-07 MED ORDER — LACTATED RINGERS IV SOLN
INTRAVENOUS | Status: DC
  Administered 2018-03-07 – 2018-03-08 (×3): via INTRAVENOUS

## 2018-03-07 MED ORDER — MORPHINE SULFATE (PF) 4 MG/ML IV SOLN
4.0000 mg | Freq: Once | INTRAVENOUS | Status: AC
  Administered 2018-03-07: 4 mg via INTRAVENOUS
  Filled 2018-03-07: qty 1

## 2018-03-07 MED ORDER — CIPROFLOXACIN IN D5W 400 MG/200ML IV SOLN
400.0000 mg | Freq: Two times a day (BID) | INTRAVENOUS | Status: DC
Start: 2018-03-08 — End: 2018-03-10
  Administered 2018-03-08 – 2018-03-10 (×5): 400 mg via INTRAVENOUS
  Filled 2018-03-07 (×6): qty 200

## 2018-03-07 MED ORDER — CIPROFLOXACIN IN D5W 400 MG/200ML IV SOLN
400.0000 mg | Freq: Once | INTRAVENOUS | Status: AC
  Administered 2018-03-07: 400 mg via INTRAVENOUS
  Filled 2018-03-07: qty 200

## 2018-03-07 MED ORDER — ONDANSETRON HCL 4 MG PO TABS: 4.0000 mg | ORAL_TABLET | Freq: Four times a day (QID) | ORAL | Status: DC | PRN

## 2018-03-07 MED ORDER — ONDANSETRON HCL 4 MG/2ML IJ SOLN
4.0000 mg | Freq: Once | INTRAMUSCULAR | Status: AC
  Administered 2018-03-07: 4 mg via INTRAVENOUS

## 2018-03-07 MED ORDER — ONDANSETRON HCL 4 MG/2ML IJ SOLN
4.0000 mg | Freq: Four times a day (QID) | INTRAMUSCULAR | Status: DC | PRN
  Administered 2018-03-07 – 2018-03-08 (×2): 4 mg via INTRAVENOUS
  Filled 2018-03-07 (×3): qty 2

## 2018-03-07 MED ORDER — ENOXAPARIN SODIUM 40 MG/0.4ML ~~LOC~~ SOLN
40.0000 mg | SUBCUTANEOUS | Status: DC
  Administered 2018-03-07 – 2018-03-14 (×8): 40 mg via SUBCUTANEOUS
  Filled 2018-03-07 (×8): qty 0.4

## 2018-03-07 MED ORDER — IOPAMIDOL (ISOVUE-370) INJECTION 76%
75.0000 mL | Freq: Once | INTRAVENOUS | Status: AC | PRN
  Administered 2018-03-07: 75 mL via INTRAVENOUS

## 2018-03-07 MED ORDER — MORPHINE SULFATE (PF) 2 MG/ML IV SOLN
2.0000 mg | INTRAVENOUS | Status: DC | PRN
  Administered 2018-03-07 – 2018-03-15 (×8): 2 mg via INTRAVENOUS
  Filled 2018-03-07 (×8): qty 1

## 2018-03-07 MED ORDER — MORPHINE SULFATE (PF) 4 MG/ML IV SOLN
INTRAVENOUS | Status: AC
  Filled 2018-03-07: qty 1

## 2018-03-07 MED ORDER — ACETAMINOPHEN 325 MG PO TABS
650.0000 mg | ORAL_TABLET | Freq: Four times a day (QID) | ORAL | Status: DC | PRN
  Administered 2018-03-14 (×2): 650 mg via ORAL
  Filled 2018-03-07 (×2): qty 2

## 2018-03-07 MED ORDER — ONDANSETRON HCL 4 MG/2ML IJ SOLN
INTRAMUSCULAR | Status: AC
  Filled 2018-03-07: qty 2

## 2018-03-07 MED ORDER — ACETAMINOPHEN 650 MG RE SUPP: 650.0000 mg | Freq: Four times a day (QID) | RECTAL | Status: DC | PRN

## 2018-03-07 MED ORDER — HYDRALAZINE HCL 20 MG/ML IJ SOLN
10.0000 mg | Freq: Four times a day (QID) | INTRAMUSCULAR | Status: DC | PRN
  Administered 2018-03-13: 10 mg via INTRAVENOUS
  Filled 2018-03-07: qty 1

## 2018-03-07 MED ORDER — ONDANSETRON HCL 4 MG/2ML IJ SOLN
4.0000 mg | Freq: Once | INTRAMUSCULAR | Status: AC
  Administered 2018-03-07: 4 mg via INTRAVENOUS
  Filled 2018-03-07: qty 2

## 2018-03-07 NOTE — Progress Notes (Signed)
Pharmacy Antibiotic Note  Marilyn Andrews is a 82 y.o. female admitted on 03/07/2018 with IAI.  Pharmacy has been consulted for ciprofloxacin dosing. Patient is also ordered metronidazole.   Plan: Ciprofloxacin 400 mg iv q 12 hours.   Height: 5' (152.4 cm) Weight: 167 lb 12.8 oz (76.1 kg) IBW/kg (Calculated) : 45.5  Temp (24hrs), Avg:98.4 F (36.9 C), Min:98.4 F (36.9 C), Max:98.4 F (36.9 C)  Recent Labs  Lab 03/07/18 1244  WBC 13.1*  CREATININE 1.10*    Estimated Creatinine Clearance: 37.2 mL/min (A) (by C-G formula based on SCr of 1.1 mg/dL (H)).    Allergies  Allergen Reactions  . Penicillins Other (See Comments)    Reaction as a child, patient does not recall  Has patient had a PCN reaction causing immediate rash, facial/tongue/throat swelling, SOB or lightheadedness with hypotension: unknown Has patient had a PCN reaction causing severe rash involving mucus membranes or skin necrosis: unknown Has patient had a PCN reaction that required hospitalization: unknown Has patient had a PCN reaction occurring within the last 10 years: No If all of the above answers are "NO", then may proceed with Cephalosporin use.  . Sulfa Antibiotics     Antimicrobials this admission: Cipro 5/6 >>  metronidazole 5/6 >>   Dose adjustments this admission:  Microbiology results:  Thank you for allowing pharmacy to be a part of this patient's care.  Luisa Hart D 03/07/2018 2:54 PM

## 2018-03-07 NOTE — ED Notes (Signed)
This tech put pt on bedpan. Pt was able to use the bathroom.

## 2018-03-07 NOTE — ED Provider Notes (Signed)
[facility] Emergency Department Provider Note  ____________________________________________   First MD Initiated Contact with Patient 03/07/18 1219     (approximate)  I have reviewed the triage vital signs and the nursing notes.   HISTORY  Chief Complaint Abdominal Pain   HPI Marilyn Andrews is a 82 y.o. female who comes to the emergency department via EMS with roughly 1 day of worsening left lower quadrant pain.  She was given 4 mg of Zofran in route with minimal improvement in her nausea.  She has a long-standing history of diverticulitis "multiple times".  She has never had surgery.  She has had pain intermittently for several months but it became acutely worse this morning.  Some diarrhea.  No constipation.  Her pain is moderate severity constant throbbing aching worse with movement improved with rest nonradiating.  Past Medical History:  Diagnosis Date  . Anxiety   . Arthritis   . Diverticulitis   . Hypertension     Patient Active Problem List   Diagnosis Date Noted  . Severe recurrent major depression without psychotic features (HCC) 03/08/2018  . Acute diverticulitis 03/07/2018  . Diverticulitis 07/01/2016    Past Surgical History:  Procedure Laterality Date  . ABDOMINAL HYSTERECTOMY    . APPENDECTOMY    . CHOLECYSTECTOMY    . HYSTERECTOMY ABDOMINAL WITH SALPINGECTOMY      Prior to Admission medications   Medication Sig Start Date End Date Taking? Authorizing Provider  amLODipine (NORVASC) 5 MG tablet Take 5 mg by mouth daily.   Yes [provider]  escitalopram (LEXAPRO) 20 MG tablet Take 20 mg by mouth daily.   Yes [provider]  fluticasone (FLONASE) 50 MCG/ACT nasal spray Place 2 sprays into both nostrils 2 (two) times daily as needed for allergies or rhinitis.    Yes [provider]  ranitidine (ZANTAC) 150 MG tablet Take 150 mg by mouth daily.   Yes [provider]  diazepam (VALIUM) 5 MG tablet  Take 1 tablet (5 mg total) by mouth every 8 (eight) hours as needed for anxiety. Patient not taking: Reported on 04/21/2017 03/17/17 03/17/18  Rebecka Apley, MD  etodolac (LODINE) 200 MG capsule Take 1 capsule (200 mg total) by mouth every 8 (eight) hours. Patient not taking: Reported on 04/21/2017 03/17/17   Rebecka Apley, MD  HYDROcodone-acetaminophen (NORCO/VICODIN) 5-325 MG tablet Take 1 tablet by mouth every 4 (four) hours as needed for moderate pain. Patient not taking: Reported on 04/21/2017 03/10/17 03/10/18  Minna Antis, MD  ibuprofen (ADVIL,MOTRIN) 600 MG tablet Take 600 mg by mouth every 8 (eight) hours as needed for moderate pain.    [provider]  lidocaine (LIDODERM) 5 % Place 1 patch onto the skin every 12 (twelve) hours. Remove & Discard patch within 12 hours or as directed by MD Patient not taking: Reported on 04/21/2017 03/17/17 03/17/18  Rebecka Apley, MD  ondansetron (ZOFRAN ODT) 4 MG disintegrating tablet Take 1 tablet (4 mg total) by mouth every 8 (eight) hours as needed for nausea or vomiting. Patient not taking: Reported on 03/07/2018 04/21/17   Nita Sickle, MD  oxyCODONE (OXY IR/ROXICODONE) 5 MG immediate release tablet Take 5 mg by mouth every 4 (four) hours as needed for moderate pain or severe pain.  04/15/17   [provider]  traMADol (ULTRAM) 50 MG tablet Take 1 tablet (50 mg total) by mouth every 6 (six) hours as needed. Patient not taking: Reported on 04/21/2017 03/17/17  Rebecka Apley, MD  triamcinolone cream (KENALOG) 0.1 % Apply 1 application topically daily as needed. Rash on hands    [provider]    Allergies Penicillins and Sulfa antibiotics  Family History  Problem Relation Age of Onset  . Hypertension Father   . CAD Brother     Social History Social History   Tobacco Use  . Smoking status: Never Smoker  . Smokeless tobacco: Never Used  Substance Use Topics  . Alcohol use: No  . Drug use: No     Review of Systems Constitutional: No fever/chills Eyes: No visual changes. ENT: No sore throat. Cardiovascular: Denies chest pain. Respiratory: Denies shortness of breath. Gastrointestinal: Positive for abdominal pain.  Positive for nausea, no vomiting.  Positive for diarrhea.  No constipation. Genitourinary: Negative for dysuria. Musculoskeletal: Negative for back pain. Skin: Negative for rash. Neurological: Negative for headaches, focal weakness or numbness.   ____________________________________________   PHYSICAL EXAM:  VITAL SIGNS: ED Triage Vitals  Enc Vitals Group     BP      Pulse      Resp      Temp      Temp src      SpO2      Weight      Height      Head Circumference      Peak Flow      Pain Score      Pain Loc      Pain Edu?      Excl. in GC?     Constitutional: Alert and oriented x4 appears obviously uncomfortable nontoxic no diaphoresis speaks full clear sentences Eyes: PERRL EOMI. Head: Atraumatic. Nose: No congestion/rhinnorhea. Mouth/Throat: No trismus Neck: No stridor.   Cardiovascular: Normal rate, regular rhythm. Grossly normal heart sounds.  Good peripheral circulation. Respiratory: Normal respiratory effort.  No retractions. Lungs CTAB and moving good air Gastrointestinal: Soft abdomen she is tender in the left lower quadrant with some rebound although no frank peritonitis Musculoskeletal: No lower extremity edema   Neurologic:  Normal speech and language. No gross focal neurologic deficits are appreciated. Skin:  Skin is warm, dry and intact. No rash noted. Psychiatric: Mood and affect are normal. Speech and behavior are normal.    ____________________________________________   DIFFERENTIAL includes but not limited to  Diverticulitis, appendicitis, volvulus, small bowel obstruction, pyelonephritis, nephrolithiasis ____________________________________________   LABS (all labs ordered are listed, but only abnormal results are  displayed)  Labs Reviewed  COMPREHENSIVE METABOLIC PANEL - Abnormal; Notable for the following components:      Result Value   Glucose, Bld 105 (*)    Creatinine, Ser 1.10 (*)    Calcium 8.4 (*)    ALT 9 (*)    GFR calc non Af Amer 46 (*)    GFR calc Af Amer 53 (*)    All other components within normal limits  CBC WITH DIFFERENTIAL/PLATELET - Abnormal; Notable for the following components:   WBC 13.1 (*)    RBC 5.40 (*)    Neutro Abs 11.6 (*)    Lymphs Abs 0.8 (*)    All other components within normal limits  BASIC METABOLIC PANEL - Abnormal; Notable for the following components:   Creatinine, Ser 1.10 (*)    Calcium 7.8 (*)    GFR calc non Af Amer 46 (*)    GFR calc Af Amer 53 (*)    All other components within normal limits  URINALYSIS, COMPLETE (UACMP) WITH MICROSCOPIC - Abnormal;  Notable for the following components:   Color, Urine YELLOW (*)    APPearance CLOUDY (*)    Bacteria, UA RARE (*)    All other components within normal limits  LIPASE, BLOOD  CBC    Lab work reviewed by me with slightly elevated white count which is nonspecific and could be secondary to infection __________________________________________  EKG   ____________________________________________  RADIOLOGY  CT abdomen pelvis reviewed by me consistent with uncomplicated diverticulitis ____________________________________________   PROCEDURES  Procedure(s) performed: no  Procedures  Critical Care performed: no  Observation: no ____________________________________________   INITIAL IMPRESSION / ASSESSMENT AND PLAN / ED COURSE  Pertinent labs & imaging results that were available during my care of the patient were reviewed by me and considered in my medical decision making (see chart for details).  Patient arrives obviously uncomfortable appearing and given IV morphine with some improvement in her symptoms.  Given her advanced age and multiple episodes of diverticulitis in the past  she requires a CT scan with IV contrast.     ----------------------------------------- 2:05 PM on 03/07/2018 ----------------------------------------- The patient CT scan shows uncomplicated diverticulitis with no abscess no obvious perforation.  Her abdomen is still somewhat tender and she is required multiple doses of IV morphine.  I offered the patient inpatient admission for continued IV antibiotics and pain control which I think is entirely reasonable given the severity of her symptoms.  I then discussed with the hospitalist who has graciously agreed to admit the patient to his service ____________________________________________   FINAL CLINICAL IMPRESSION(S) / ED DIAGNOSES  Final diagnoses:  Sigmoid diverticulitis      NEW MEDICATIONS STARTED DURING THIS VISIT:  Current Discharge Medication List       Note:  This document was prepared using Dragon voice recognition software and may include unintentional dictation errors.     Merrily Brittle, MD 03/08/18 2238

## 2018-03-07 NOTE — ED Triage Notes (Signed)
Pt is HOH. Arrives [facility] from home. States L sided abd pain. States urinary frequency but not much comes out. Nausea, EMS gave  zofran IM. States "it's been hurting for months, just nagging, but it got worse this morning." pt states she has been taking pain medicine at home along with nausea medicine. Denies diarrhrea. Alert, oriented.

## 2018-03-07 NOTE — H&P (Signed)
[facility] [place] [facility]   PATIENT NAME: Marilyn Andrews    MR#:  [id number]  DATE OF BIRTH:  September 10, 1936  DATE OF ADMISSION:  03/07/2018  PRIMARY CARE PHYSICIAN: [facility], [facility]   REQUESTING/REFERRING PHYSICIAN: Dr. Lamont Snowball  CHIEF COMPLAINT:   Chief Complaint  Patient presents with  . Abdominal Pain    HISTORY OF PRESENT ILLNESS:  Marilyn Andrews  is a 82 y.o. female with a known history of HTN, prior diverticulitis, chronic abdominal pain here with worsening lower abd pain. Nausea. Found to have acute diverticulitis of descending and sigmoid colon. No abscess. Elevated WBC and severe pain. Being admitted to hospital.  PAST MEDICAL HISTORY:   Past Medical History:  Diagnosis Date  . Anxiety   . Arthritis   . Diverticulitis   . Hypertension     PAST SURGICAL HISTORY:   Past Surgical History:  Procedure Laterality Date  . ABDOMINAL HYSTERECTOMY    . APPENDECTOMY    . CHOLECYSTECTOMY    . HYSTERECTOMY ABDOMINAL WITH SALPINGECTOMY      SOCIAL HISTORY:   Social History   Tobacco Use  . Smoking status: Never Smoker  . Smokeless tobacco: Never Used  Substance Use Topics  . Alcohol use: No    FAMILY HISTORY:   Family History  Problem Relation Age of Onset  . Hypertension Father   . CAD Brother     DRUG ALLERGIES:   Allergies  Allergen Reactions  . Penicillins Other (See Comments)    Reaction as a child, patient does not recall  Has patient had a PCN reaction causing immediate rash, facial/tongue/throat swelling, SOB or lightheadedness with hypotension: unknown Has patient had a PCN reaction causing severe rash involving mucus membranes or skin necrosis: unknown Has patient had a PCN reaction that required hospitalization: unknown Has patient had a PCN reaction occurring within the last 10 years: No If all of the above answers are "NO", then may proceed with Cephalosporin use.  . Sulfa Antibiotics     REVIEW OF  SYSTEMS:   Review of Systems  Constitutional: Positive for chills and malaise/fatigue. Negative for fever.  HENT: Negative for sore throat.   Eyes: Negative for blurred vision, double vision and pain.  Respiratory: Negative for cough, hemoptysis, shortness of breath and wheezing.   Cardiovascular: Negative for chest pain, palpitations, orthopnea and leg swelling.  Gastrointestinal: Positive for abdominal pain and nausea. Negative for constipation, diarrhea, heartburn and vomiting.  Genitourinary: Negative for dysuria and hematuria.  Musculoskeletal: Negative for back pain and joint pain.  Skin: Negative for rash.  Neurological: Negative for sensory change, speech change, focal weakness and headaches.  Endo/Heme/Allergies: Does not bruise/bleed easily.  Psychiatric/Behavioral: Negative for depression. The patient is not nervous/anxious.     MEDICATIONS AT HOME:   Prior to Admission medications   Medication Sig Start Date End Date Taking? Authorizing Provider  amLODipine (NORVASC) 5 MG tablet Take 5 mg by mouth daily.   Yes [provider]  escitalopram (LEXAPRO) 20 MG tablet Take 20 mg by mouth daily.   Yes [provider]  fluticasone (FLONASE) 50 MCG/ACT nasal spray Place 2 sprays into both nostrils 2 (two) times daily as needed for allergies or rhinitis.    Yes [provider]  ranitidine (ZANTAC) 150 MG tablet Take 150 mg by mouth daily.   Yes [provider]  diazepam (VALIUM) 5 MG tablet Take 1 tablet (5 mg total) by mouth every 8 (eight) hours as needed  for anxiety. Patient not taking: Reported on 04/21/2017 03/17/17 03/17/18  Rebecka Apley, MD  etodolac (LODINE) 200 MG capsule Take 1 capsule (200 mg total) by mouth every 8 (eight) hours. Patient not taking: Reported on 04/21/2017 03/17/17   Rebecka Apley, MD  HYDROcodone-acetaminophen (NORCO/VICODIN) 5-325 MG tablet Take 1 tablet by mouth every 4 (four) hours as needed for moderate  pain. Patient not taking: Reported on 04/21/2017 03/10/17 03/10/18  Minna Antis, MD  ibuprofen (ADVIL,MOTRIN) 600 MG tablet Take 600 mg by mouth every 8 (eight) hours as needed for moderate pain.    [provider]  lidocaine (LIDODERM) 5 % Place 1 patch onto the skin every 12 (twelve) hours. Remove & Discard patch within 12 hours or as directed by MD Patient not taking: Reported on 04/21/2017 03/17/17 03/17/18  Rebecka Apley, MD  ondansetron (ZOFRAN ODT) 4 MG disintegrating tablet Take 1 tablet (4 mg total) by mouth every 8 (eight) hours as needed for nausea or vomiting. Patient not taking: Reported on 03/07/2018 04/21/17   Nita Sickle, MD  oxyCODONE (OXY IR/ROXICODONE) 5 MG immediate release tablet Take 5 mg by mouth every 4 (four) hours as needed for moderate pain or severe pain.  04/15/17   [provider]  traMADol (ULTRAM) 50 MG tablet Take 1 tablet (50 mg total) by mouth every 6 (six) hours as needed. Patient not taking: Reported on 04/21/2017 03/17/17   Rebecka Apley, MD  triamcinolone cream (KENALOG) 0.1 % Apply 1 application topically daily as needed. Rash on hands    [provider]     VITAL SIGNS:  Blood pressure (!) 154/52, pulse 70, temperature 98.4 F (36.9 C), temperature source Oral, resp. rate 20, height 5' (1.524 m), weight 76.1 kg (167 lb 12.8 oz), SpO2 95 %.  PHYSICAL EXAMINATION:  Physical Exam  GENERAL:  82 y.o.-year-old patient lying in the bed with no acute distress.  EYES: Pupils equal, round, reactive to light and accommodation. No scleral icterus. Extraocular muscles intact.  HEENT: Head atraumatic, normocephalic. Oropharynx and nasopharynx clear. No oropharyngeal erythema, moist oral mucosa  NECK:  Supple, no jugular venous distention. No thyroid enlargement, no tenderness.  LUNGS: Normal breath sounds bilaterally, no wheezing, rales, rhonchi. No use of accessory muscles of respiration.  CARDIOVASCULAR: S1, S2 normal. No  murmurs, rubs, or gallops.  ABDOMEN: Soft, tender lower abdomen, nondistended. Bowel sounds present. No organomegaly or mass.  EXTREMITIES: No pedal edema, cyanosis, or clubbing. + 2 pedal & radial pulses b/l.   NEUROLOGIC: Cranial nerves II through XII are intact. No focal Motor or sensory deficits appreciated b/l PSYCHIATRIC: The patient is alert and oriented x 3. Good affect.  SKIN: No obvious rash, lesion, or ulcer.   LABORATORY PANEL:   CBC Recent Labs  Lab 03/07/18 1244  WBC 13.1*  HGB 15.3  HCT 45.6  PLT 383   ------------------------------------------------------------------------------------------------------------------  Chemistries  Recent Labs  Lab 03/07/18 1244  NA 138  K 3.7  CL 103  CO2 27  GLUCOSE 105*  BUN 11  CREATININE 1.10*  CALCIUM 8.4*  AST 21  ALT 9*  ALKPHOS 91  BILITOT 0.8   ------------------------------------------------------------------------------------------------------------------  Cardiac Enzymes No results for input(s): TROPONINI in the last 168 hours. ------------------------------------------------------------------------------------------------------------------  RADIOLOGY:  Ct Abdomen Pelvis W Contrast  Result Date: 03/07/2018 CLINICAL DATA:  Left-sided abdominal pain. Nausea. Set increase in symptoms this morning. EXAM: CT ABDOMEN AND PELVIS WITH CONTRAST TECHNIQUE: Multidetector CT imaging of the abdomen and pelvis was performed  using the standard protocol following bolus administration of intravenous contrast. CONTRAST:  75mL ISOVUE-370 IOPAMIDOL (ISOVUE-370) INJECTION 76% COMPARISON:  CT abdomen and pelvis 04/21/2017 FINDINGS: Lower chest: Mild dependent atelectasis and scarring is again noted. The heart size is normal. No other focal nodule or mass lesion is present. No significant pleural or pericardial effusion is present. Hepatobiliary: Intra and extrahepatic biliary dilation has progressed since the prior exam. No obstructing  mass lesion is present. The common bile duct now measures 0.6 cm compared with 1.1 cm on the prior exam. No focal hepatic mass lesions are present. Pancreas: The common bile duct is dilated within the pancreatic head. Pancreatic duct is not dilated. No focal mass lesion is present. There is no significant cystic disease. Spleen: Choose 1 Adrenals/Urinary Tract: Adrenal glands are normal bilaterally. Kidneys and ureters are within normal limits. The urinary bladder is unremarkable. Stomach/Bowel: The stomach and duodenum are within normal limits. The small bowel is unremarkable. Terminal ileum is within normal limits. Appendectomy is noted. Ascending and transverse colon are within normal limits. Diverticular changes are present in the descending colon. There is mild inflammatory change in the distal descending colon compatible with acute diverticulitis. Additional diverticular changes are present throughout the sigmoid colon without other areas of focal inflammation. There is no evidence for bowel perforation or abscess. Vascular/Lymphatic: Atherosclerotic calcifications are present in the aorta without aneurysm or stenosis. No significant adenopathy is present. Reproductive: Status post hysterectomy. No adnexal masses. Other: No abdominal wall hernia or abnormality. No abdominopelvic ascites. Musculoskeletal: Vertebral body heights are maintained. Endplate changes are present at L4-5 and L5-S1. Moderate facet hypertrophy is noted bilaterally. Bilateral foraminal narrowing is present at L4-5 and L5-S1. IMPRESSION: 1. Descending and sigmoid diverticulitis without evidence for bowel perforation or abscess. 2. Additional diverticular changes throughout the sigmoid colon without other areas of focal inflammation. 3.  Aortic Atherosclerosis (ICD10-I70.0). 4. Increasing intra and have extrahepatic biliary dilation. There is a mild increase and alkaline phosphatase, still within normal range. No obstructing mass lesion is  present. Electronically Signed   By: Marin Roberts M.D.   On: 03/07/2018 13:42     IMPRESSION AND PLAN:   * Acute descending and sigmoid diverticulitis Start ciprofloxacin and flagyl NPO except meds till pain improves IVF Will need surgical referral as OP at discharge due to recurrent diverticulitis and chronic abd pain. May need partial colectomy.  * HTN Continue norvasc from home. Elevated today likely due to pain. Hydralazine IV  * DVT prophylaxis Lovenox  All the records are reviewed and case discussed with ED provider. Management plans discussed with the patient, family and they are in agreement.  CODE STATUS: DNR  TOTAL TIME TAKING CARE OF THIS PATIENT: 40 minutes.   Molinda Bailiff Seraphim Trow M.D on 03/07/2018 at 2:22 PM  Between 7am to 6pm - Pager - [phone]  After 6pm go to [email or web address] - password [email or web address]  [facility] [place] [facility]  Office  [phone]  CC: Primary care physician; [facility]  Note: This dictation was prepared with Dragon dictation along with smaller phrase technology. Any transcriptional errors that result from this process are unintentional.

## 2018-03-07 NOTE — ED Notes (Signed)
Patient transported to room 104 by this EDT. 

## 2018-03-08 ENCOUNTER — Encounter: Payer: Self-pay | Admitting: Internal Medicine

## 2018-03-08 DIAGNOSIS — F332 Major depressive disorder, recurrent severe without psychotic features: Secondary | ICD-10-CM

## 2018-03-08 LAB — BASIC METABOLIC PANEL
Anion gap: 5 (ref 5–15)
BUN: 12 mg/dL (ref 6–20)
CO2: 30 mmol/L (ref 22–32)
Calcium: 7.8 mg/dL — ABNORMAL LOW (ref 8.9–10.3)
Chloride: 104 mmol/L (ref 101–111)
Creatinine, Ser: 1.1 mg/dL — ABNORMAL HIGH (ref 0.44–1.00)
GFR calc Af Amer: 53 mL/min — ABNORMAL LOW (ref >60)
GFR calc non Af Amer: 46 mL/min — ABNORMAL LOW (ref >60)
Glucose, Bld: 94 mg/dL (ref 65–99)
Potassium: 3.6 mmol/L (ref 3.5–5.1)
Sodium: 139 mmol/L (ref 135–145)

## 2018-03-08 LAB — URINALYSIS, COMPLETE (UACMP) WITH MICROSCOPIC
Bilirubin Urine: NEGATIVE
Glucose, UA: NEGATIVE mg/dL
Hgb urine dipstick: NEGATIVE
Ketones, ur: NEGATIVE mg/dL
Leukocytes, UA: NEGATIVE
Nitrite: NEGATIVE
Protein, ur: NEGATIVE mg/dL
Specific Gravity, Urine: 1.014 (ref 1.005–1.030)
Squamous Epithelial / LPF: NONE SEEN (ref 0–5)
pH: 8 (ref 5.0–8.0)

## 2018-03-08 LAB — CBC
HCT: 39.4 % (ref 35.0–47.0)
Hemoglobin: 13.3 g/dL (ref 12.0–16.0)
MCH: 28.7 pg (ref 26.0–34.0)
MCHC: 33.7 g/dL (ref 32.0–36.0)
MCV: 85.1 fL (ref 80.0–100.0)
Platelets: 331 10*3/uL (ref 150–440)
RBC: 4.63 MIL/uL (ref 3.80–5.20)
RDW: 14.2 % (ref 11.5–14.5)
WBC: 7.1 10*3/uL (ref 3.6–11.0)

## 2018-03-08 MED ORDER — ONDANSETRON HCL 4 MG/2ML IJ SOLN
4.0000 mg | Freq: Four times a day (QID) | INTRAMUSCULAR | Status: DC | PRN
  Administered 2018-03-08: 4 mg via INTRAVENOUS
  Filled 2018-03-08 (×2): qty 2

## 2018-03-08 MED ORDER — ONDANSETRON HCL 4 MG PO TABS
4.0000 mg | ORAL_TABLET | ORAL | Status: DC | PRN
  Administered 2018-03-11 – 2018-03-13 (×2): 4 mg via ORAL
  Filled 2018-03-08 (×2): qty 1

## 2018-03-08 MED ORDER — BUPROPION HCL ER (SR) 100 MG PO TB12
100.0000 mg | ORAL_TABLET | Freq: Every day | ORAL | Status: DC
  Administered 2018-03-09 – 2018-03-10 (×2): 100 mg via ORAL
  Filled 2018-03-08 (×3): qty 1

## 2018-03-08 MED ORDER — ONDANSETRON HCL 4 MG/2ML IJ SOLN
4.0000 mg | INTRAMUSCULAR | Status: DC | PRN
  Administered 2018-03-08 – 2018-03-15 (×10): 4 mg via INTRAVENOUS
  Filled 2018-03-08 (×10): qty 2

## 2018-03-08 MED ORDER — PROMETHAZINE HCL 25 MG/ML IJ SOLN
12.5000 mg | Freq: Four times a day (QID) | INTRAMUSCULAR | Status: DC | PRN
  Administered 2018-03-08 – 2018-03-10 (×4): 12.5 mg via INTRAVENOUS
  Filled 2018-03-08 (×5): qty 1

## 2018-03-08 MED ORDER — ONDANSETRON HCL 4 MG PO TABS: 4.0000 mg | ORAL_TABLET | ORAL | Status: DC | PRN

## 2018-03-08 MED ORDER — ESCITALOPRAM OXALATE 10 MG PO TABS
20.0000 mg | ORAL_TABLET | Freq: Every day | ORAL | Status: DC
  Administered 2018-03-09 – 2018-03-15 (×7): 20 mg via ORAL
  Filled 2018-03-08 (×8): qty 2

## 2018-03-08 NOTE — Progress Notes (Signed)
   03/08/18 1255  Clinical Encounter Type  Visited With Patient;Health care provider  Visit Type Initial (order for support re: major life transition, loss of spouse)  Referral From Nurse  Consult/Referral To Chaplain  Spiritual Encounters  Spiritual Needs Emotional;Grief support   Chaplain responded to order.  Nurse tech taking vitals upon chaplain arrival.  Chaplain checked in with patient about the order for support.  Patient expressed surprise for such a thing, said she couldn't "think why I'd need that."  She did report that her "roommate" of 50 years died last month, which was a different term than appeared on the consult (spouse).  She said that she shut out her family after the loss, didn't want to talk about it then, and doesn't want to talk about it now.  Chaplain spoke to how individual grief is and said that patient didn't have to talk if she didn't want to.  Chaplain reviewed 24/7 chaplain availability and encouraged patient to have staff page chaplain if needs change.

## 2018-03-08 NOTE — Consult Note (Signed)
[facility] Consult   Reason for Consult: Consult for 82 year old woman with a history of depression currently in the hospital with diverticulitis Referring Physician: Vianne Bulls Patient Identification: Marilyn Andrews MRN:  [id number] Principal Diagnosis: Severe recurrent major depression without psychotic features [facility]) Diagnosis:   Patient Active Problem List   Diagnosis Date Noted  . Severe recurrent major depression without psychotic features ([place]) [F33.2] 03/08/2018  . Acute diverticulitis [K57.92] 03/07/2018  . Diverticulitis [K57.92] 07/01/2016    Total Time spent with patient: 1 hour  Subjective:   Marilyn Andrews is a 82 y.o. female patient admitted with "I am not doing good".  HPI: Patient interviewed chart reviewed.  82 year old woman in the hospital with abdominal pain and diverticulitis.  Patient admits that her mood has been very bad and getting worse recently.  Her sister, with whom she had lived, passed away just this past month.  This leaves the patient at home by herself.  Since her sister's death and may be even before the patient has felt like her energy level has been very poor.  She has lost all interest in most activities.  Often does not get out of bed.  Not eating well.  Losing weight.  Sleeping during the day and not at night.  Denies having any suicidal thoughts denies any psychosis.  Patient claims that she has still been compliant with her antidepressant which has been Lexapro.  Denies any substance abuse.  Medical history: Acute diverticulitis  Substance abuse history: None  Social history: Patient says that her daughter-in-law still checks in on her fairly regularly but that otherwise she is now alone at home and sounds like she is quite lonely.  Past Psychiatric History: Patient has a long history of depression and has been on medication for years through her primary care doctor she depends on taking the narcotics that she receives for pain as a way  to sleep at night.  She denies any past history of suicide attempts no known prior hospitalizations.  Has a past history of being perhaps over fond of benzodiazepines which have subsequently been discontinued  Risk to Self: Is patient at risk for suicide?: No Risk to Others:   Prior Inpatient Therapy:   Prior Outpatient Therapy:    Past Medical History:  Past Medical History:  Diagnosis Date  . Anxiety   . Arthritis   . Diverticulitis   . Hypertension     Past Surgical History:  Procedure Laterality Date  . ABDOMINAL HYSTERECTOMY    . APPENDECTOMY    . CHOLECYSTECTOMY    . HYSTERECTOMY ABDOMINAL WITH SALPINGECTOMY     Family History:  Family History  Problem Relation Age of Onset  . Hypertension Father   . CAD Brother    Family Psychiatric  History: None known Social History:  Social History   Substance and Sexual Activity  Alcohol Use No     Social History   Substance and Sexual Activity  Drug Use No    Social History   Socioeconomic History  . Marital status: Single    Spouse name: Not on file  . Number of children: Not on file  . Years of education: Not on file  . Highest education level: Not on file  Occupational History  . Not on file  Social Needs  . Financial resource strain: Not on file  . Food insecurity:    Worry: Not on file    Inability: Not on file  . Transportation needs:  Medical: Not on file    Non-medical: Not on file  Tobacco Use  . Smoking status: Never Smoker  . Smokeless tobacco: Never Used  Substance and Sexual Activity  . Alcohol use: No  . Drug use: No  . Sexual activity: Not Currently  Lifestyle  . Physical activity:    Days per week: Not on file    Minutes per session: Not on file  . Stress: Not on file  Relationships  . Social connections:    Talks on phone: Not on file    Gets together: Not on file    Attends religious service: Not on file    Active member of club or organization: Not on file    Attends meetings  of clubs or organizations: Not on file    Relationship status: Not on file  Other Topics Concern  . Not on file  Social History Narrative  . Not on file   Additional Social History:    Allergies:   Allergies  Allergen Reactions  . Penicillins Other (See Comments)    Reaction as a child, patient does not recall  Has patient had a PCN reaction causing immediate rash, facial/tongue/throat swelling, SOB or lightheadedness with hypotension: unknown Has patient had a PCN reaction causing severe rash involving mucus membranes or skin necrosis: unknown Has patient had a PCN reaction that required hospitalization: unknown Has patient had a PCN reaction occurring within the last 10 years: No If all of the above answers are "NO", then may proceed with Cephalosporin use.  . Sulfa Antibiotics     Labs:  Results for orders placed or performed during the hospital encounter of 03/07/18 (from the past 48 hour(s))  Lipase, blood     Status: None   Collection Time: 03/07/18 12:44 PM  Result Value Ref Range   Lipase 25 11 - 51 U/L    Comment: Performed at [facility], [address] [place], [place] [address]  Comprehensive metabolic panel     Status: Abnormal   Collection Time: 03/07/18 12:44 PM  Result Value Ref Range   Sodium 138 135 - 145 mmol/L   Potassium 3.7 3.5 - 5.1 mmol/L   Chloride 103 101 - 111 mmol/L   CO2 27 22 - 32 mmol/L   Glucose, Bld 105 (H) 65 - 99 mg/dL   BUN 11 6 - 20 mg/dL   Creatinine, Ser 1.10 (H) 0.44 - 1.00 mg/dL   Calcium 8.4 (L) 8.9 - 10.3 mg/dL   Total Protein 7.3 6.5 - 8.1 g/dL   Albumin 3.6 3.5 - 5.0 g/dL   AST 21 15 - 41 U/L   ALT 9 (L) 14 - 54 U/L   Alkaline Phosphatase 91 38 - 126 U/L   Total Bilirubin 0.8 0.3 - 1.2 mg/dL   GFR calc non Af Amer 46 (L) >60 mL/min   GFR calc Af Amer 53 (L) >60 mL/min    Comment: (NOTE) The eGFR has been calculated using the CKD EPI equation. This calculation has not been validated in all clinical  situations. eGFR's persistently <60 mL/min signify possible Chronic Kidney Disease.    Anion gap 8 5 - 15    Comment: Performed at [facility], [address] [place], [place] [address]  CBC with Differential     Status: Abnormal   Collection Time: 03/07/18 12:44 PM  Result Value Ref Range   WBC 13.1 (H) 3.6 - 11.0 K/uL   RBC 5.40 (H) 3.80 - 5.20 MIL/uL  Hemoglobin 15.3 12.0 - 16.0 g/dL   HCT 45.6 35.0 - 47.0 %   MCV 84.5 80.0 - 100.0 fL   MCH 28.3 26.0 - 34.0 pg   MCHC 33.5 32.0 - 36.0 g/dL   RDW 14.4 11.5 - 14.5 %   Platelets 383 150 - 440 K/uL   Neutrophils Relative % 89 %   Neutro Abs 11.6 (H) 1.4 - 6.5 K/uL   Lymphocytes Relative 6 %   Lymphs Abs 0.8 (L) 1.0 - 3.6 K/uL   Monocytes Relative 5 %   Monocytes Absolute 0.6 0.2 - 0.9 K/uL   Eosinophils Relative 0 %   Eosinophils Absolute 0.1 0 - 0.7 K/uL   Basophils Relative 0 %   Basophils Absolute 0.0 0 - 0.1 K/uL    Comment: Performed at [facility], [address] [place], [place] [address]  Basic metabolic panel     Status: Abnormal   Collection Time: 03/08/18  4:10 AM  Result Value Ref Range   Sodium 139 135 - 145 mmol/L   Potassium 3.6 3.5 - 5.1 mmol/L   Chloride 104 101 - 111 mmol/L   CO2 30 22 - 32 mmol/L   Glucose, Bld 94 65 - 99 mg/dL   BUN 12 6 - 20 mg/dL   Creatinine, Ser 1.10 (H) 0.44 - 1.00 mg/dL   Calcium 7.8 (L) 8.9 - 10.3 mg/dL   GFR calc non Af Amer 46 (L) >60 mL/min   GFR calc Af Amer 53 (L) >60 mL/min    Comment: (NOTE) The eGFR has been calculated using the CKD EPI equation. This calculation has not been validated in all clinical situations. eGFR's persistently <60 mL/min signify possible Chronic Kidney Disease.    Anion gap 5 5 - 15    Comment: Performed at [facility], [address] [place], [place] [address]  CBC     Status: None   Collection Time: 03/08/18  4:10 AM  Result Value Ref Range   WBC 7.1 3.6 - 11.0 K/uL   RBC 4.63 3.80 - 5.20 MIL/uL    Hemoglobin 13.3 12.0 - 16.0 g/dL   HCT 39.4 35.0 - 47.0 %   MCV 85.1 80.0 - 100.0 fL   MCH 28.7 26.0 - 34.0 pg   MCHC 33.7 32.0 - 36.0 g/dL   RDW 14.2 11.5 - 14.5 %   Platelets 331 150 - 440 K/uL    Comment: Performed at [facility], [address] [place], [place] [address]  Urinalysis, Complete w Microscopic     Status: Abnormal   Collection Time: 03/08/18  6:17 PM  Result Value Ref Range   Color, Urine YELLOW (A) YELLOW   APPearance CLOUDY (A) CLEAR   Specific Gravity, Urine 1.014 1.005 - 1.030   pH 8.0 5.0 - 8.0   Glucose, UA NEGATIVE NEGATIVE mg/dL   Hgb urine dipstick NEGATIVE NEGATIVE   Bilirubin Urine NEGATIVE NEGATIVE   Ketones, ur NEGATIVE NEGATIVE mg/dL   Protein, ur NEGATIVE NEGATIVE mg/dL   Nitrite NEGATIVE NEGATIVE   Leukocytes, UA NEGATIVE NEGATIVE   RBC / HPF 0-5 0 - 5 RBC/hpf   WBC, UA 0-5 0 - 5 WBC/hpf   Bacteria, UA RARE (A) NONE SEEN   Squamous Epithelial / LPF NONE SEEN 0 - 5    Comment: Please note change in reference range.   Triple Phosphate Crystal PRESENT     Comment: Performed at [facility], [address], [place], [place] [address]  Current Facility-Administered Medications  Medication Dose Route Frequency Provider Last Rate Last Dose  . acetaminophen (TYLENOL) tablet 650 mg  650 mg Oral Q6H PRN Hillary Bow, MD       Or  . acetaminophen (TYLENOL) suppository 650 mg  650 mg Rectal Q6H PRN Sudini, Srikar, MD      . albuterol (PROVENTIL) (2.5 MG/3ML) 0.083% nebulizer solution 2.5 mg  2.5 mg Nebulization Q2H PRN Sudini, Alveta Heimlich, MD      . buPROPion [facility] SR) 12 hr tablet 100 mg  100 mg Oral Daily Emberlyn Burlison T, MD      . ciprofloxacin (CIPRO) IVPB 400 mg  400 mg Intravenous Q12H Hillary Bow, MD   Stopped at 03/08/18 1950  . enoxaparin (LOVENOX) injection 40 mg  40 mg Subcutaneous Q24H Hillary Bow, MD   40 mg at 03/08/18 2038  . escitalopram (LEXAPRO) tablet 20 mg  20 mg Oral Daily Edie Darley T, MD      .  hydrALAZINE (APRESOLINE) injection 10 mg  10 mg Intravenous Q6H PRN Sudini, Alveta Heimlich, MD      . HYDROcodone-acetaminophen (NORCO/VICODIN) 5-325 MG per tablet 1-2 tablet  1-2 tablet Oral Q4H PRN Hillary Bow, MD      . lactated ringers infusion   Intravenous Continuous Hillary Bow, MD 75 mL/hr at 03/08/18 2039    . metroNIDAZOLE (FLAGYL) tablet 500 mg  500 mg Oral Q8H Sudini, Srikar, MD   500 mg at 03/08/18 2037  . morphine 2 MG/ML injection 2 mg  2 mg Intravenous Q4H PRN Hillary Bow, MD   2 mg at 03/08/18 1856  . ondansetron (ZOFRAN) injection 4 mg  4 mg Intravenous Q4H PRN Demetrios Loll, MD   4 mg at 03/08/18 1856   Or  . ondansetron (ZOFRAN) tablet 4 mg  4 mg Oral Q4H PRN Demetrios Loll, MD      . polyethylene glycol (MIRALAX / GLYCOLAX) packet 17 g  17 g Oral Daily PRN Sudini, Alveta Heimlich, MD      . promethazine (PHENERGAN) injection 12.5 mg  12.5 mg Intravenous Q6H PRN Hillary Bow, MD   12.5 mg at 03/08/18 2038    Musculoskeletal: Strength & Muscle Tone: decreased Gait & Station: unsteady Patient leans: N/A  Psychiatric Specialty Exam: Physical Exam  Nursing note and vitals reviewed. Constitutional: She appears well-developed and well-nourished.  HENT:  Head: Normocephalic and atraumatic.  Eyes: Pupils are equal, round, and reactive to light. Conjunctivae are normal.  Neck: Normal range of motion.  Cardiovascular: Regular rhythm and normal heart sounds.  Respiratory: Effort normal. No respiratory distress.  GI: Soft.  Musculoskeletal: Normal range of motion.  Neurological: She is alert.  Skin: Skin is warm and dry.  Psychiatric: Judgment normal. Her affect is blunt. Her speech is delayed. She is slowed. Cognition and memory are impaired. She exhibits a depressed mood. She expresses no suicidal ideation.    Review of Systems  Constitutional: Positive for malaise/fatigue and weight loss.  HENT: Negative.   Eyes: Negative.   Respiratory: Negative.   Cardiovascular: Negative.    Gastrointestinal: Positive for abdominal pain and nausea.  Musculoskeletal: Negative.   Skin: Negative.   Neurological: Negative.   Psychiatric/Behavioral: Positive for depression. Negative for hallucinations, memory loss, substance abuse and suicidal ideas. The patient is nervous/anxious and has insomnia.     Blood pressure (!) 155/65, pulse 68, temperature 98.3 F (36.8 C), temperature source Oral, resp. rate 16, height 5' (1.524 m), weight 75.2 kg (165 lb 11.2 oz), SpO2 94 %.  Body mass index is 32.36 kg/m.  General Appearance: Fairly Groomed  Eye Contact:  Minimal  Speech:  Slow  Volume:  Decreased  Mood:  Depressed and Dysphoric  Affect:  Congruent  Thought Process:  Goal Directed  Orientation:  Full (Time, Place, and Person)  Thought Content:  Logical  Suicidal Thoughts:  No  Homicidal Thoughts:  No  Memory:  Immediate;   Fair Recent;   Fair Remote;   Fair  Judgement:  Fair  Insight:  Fair  Psychomotor Activity:  Decreased  Concentration:  Concentration: Fair  Recall:  [employer] of Knowledge:  Fair  Language:  Fair  Akathisia:  No  Handed:  Right  AIMS (if indicated):     Assets:  Communication Skills Desire for Improvement Housing Resilience  ADL's:  Impaired  Cognition:  WNL  Sleep:        Treatment Plan Summary: Daily contact with patient to assess and evaluate symptoms and progress in treatment, Medication management and Plan 82 year old woman with a history of recurrent depression has suffered the loss of her sister.  She is complaining of multiple serious symptoms of depression and seems to be not taking care of herself very well at home.  I have restarted her Lexapro and also added a low-dose of Wellbutrin 100 mg a day to start with to treat her depression.  Discussed the diagnosis with the patient.  If they are not already involved I am going to ask social work to speak with her as I am concerned about her living situation and ability to take care of herself  at home.  I will follow up.  Disposition: No evidence of imminent risk to self or others at present.   Patient does not meet criteria for psychiatric inpatient admission. Supportive therapy provided about ongoing stressors. Discussed crisis plan, support from social network, calling 911, coming to the Emergency Department, and calling Suicide Hotline.  Alethia Berthold, MD 03/08/2018 9:09 PM

## 2018-03-08 NOTE — Progress Notes (Signed)
[employer] [facility] - [place] [facility]   PATIENT NAME: Marilyn Andrews    MR#:  [id number]  DATE OF BIRTH:  Dec 30, 1935  SUBJECTIVE:  CHIEF COMPLAINT:   Chief Complaint  Patient presents with  . Abdominal Pain   Still abdominal pain in the lower part in the left side.  No nausea, vomiting or diarrhea or melena or bloody stool. REVIEW OF SYSTEMS:  Review of Systems  Constitutional: Negative for chills, fever and malaise/fatigue.  HENT: Negative for sore throat.   Eyes: Negative for blurred vision and double vision.  Respiratory: Negative for cough, hemoptysis, shortness of breath, wheezing and stridor.   Cardiovascular: Negative for chest pain, palpitations, orthopnea and leg swelling.  Gastrointestinal: Positive for abdominal pain. Negative for blood in stool, diarrhea, melena, nausea and vomiting.  Genitourinary: Negative for dysuria, flank pain and hematuria.  Musculoskeletal: Negative for back pain and joint pain.  Skin: Negative for rash.  Neurological: Negative for dizziness, sensory change, focal weakness, seizures, loss of consciousness, weakness and headaches.  Endo/Heme/Allergies: Negative for polydipsia.  Psychiatric/Behavioral: Positive for depression. The patient is nervous/anxious.     DRUG ALLERGIES:   Allergies  Allergen Reactions  . Penicillins Other (See Comments)    Reaction as a child, patient does not recall  Has patient had a PCN reaction causing immediate rash, facial/tongue/throat swelling, SOB or lightheadedness with hypotension: unknown Has patient had a PCN reaction causing severe rash involving mucus membranes or skin necrosis: unknown Has patient had a PCN reaction that required hospitalization: unknown Has patient had a PCN reaction occurring within the last 10 years: No If all of the above answers are "NO", then may proceed with Cephalosporin use.  . Sulfa Antibiotics    VITALS:  Blood pressure (!) 155/60, pulse (!) 54,  temperature 98.2 F (36.8 C), temperature source Oral, resp. rate 20, height 5' (1.524 m), weight 165 lb 11.2 oz (75.2 kg), SpO2 94 %. PHYSICAL EXAMINATION:  Physical Exam  Constitutional: She is oriented to person, place, and time.  HENT:  Head: Normocephalic.  Mouth/Throat: Oropharynx is clear and moist.  Eyes: Pupils are equal, round, and reactive to light. Conjunctivae and EOM are normal. No scleral icterus.  Neck: Normal range of motion. Neck supple. No JVD present. No tracheal deviation present.  Cardiovascular: Normal rate, regular rhythm and normal heart sounds. Exam reveals no gallop.  No murmur heard. Pulmonary/Chest: Effort normal and breath sounds normal. No respiratory distress. She has no wheezes. She has no rales.  Abdominal: Soft. Bowel sounds are normal. She exhibits no distension. There is tenderness. There is no rebound.  Musculoskeletal: Normal range of motion. She exhibits no edema or tenderness.  Neurological: She is alert and oriented to person, place, and time. No cranial nerve deficit.  Skin: No rash noted. No erythema.  Psychiatric: She has a normal mood and affect.   LABORATORY PANEL:  Female CBC Recent Labs  Lab 03/08/18 0410  WBC 7.1  HGB 13.3  HCT 39.4  PLT 331   ------------------------------------------------------------------------------------------------------------------ Chemistries  Recent Labs  Lab 03/07/18 1244 03/08/18 0410  NA 138 139  K 3.7 3.6  CL 103 104  CO2 27 30  GLUCOSE 105* 94  BUN 11 12  CREATININE 1.10* 1.10*  CALCIUM 8.4* 7.8*  AST 21  --   ALT 9*  --   ALKPHOS 91  --   BILITOT 0.8  --    RADIOLOGY:  No results found. ASSESSMENT AND PLAN:   * Acute  descending and sigmoid diverticulitis with leukocytosis. Continue ciprofloxacin and flagyl Start CLD,  IVF Will need surgical referral as OP at discharge due to recurrent diverticulitis and chronic abd pain. May need partial colectomy. Leukocytosis improved.  *  HTN Continue norvasc from home. Elevated today likely due to pain. Hydralazine IV  Depression and anxiety.  She lost her roommate 2 weeks ago and feels anxious and depressed.  Psych consult.  All the records are reviewed and case discussed with Care Management/Social Worker. Management plans discussed with the patient, family and they are in agreement.  CODE STATUS: DNR  TOTAL TIME TAKING CARE OF THIS PATIENT: 33 minutes.  More than 50% of the time was spent in counseling/coordination of care: YES  POSSIBLE D/C IN 2 DAYS, DEPENDING ON CLINICAL CONDITION.   Shaune Pollack M.D on 03/08/2018 at 2:33 PM  Between 7am to 6pm - Pager - [phone]  After 6pm go to [email or web address] - Therapist, nutritional Hospitalists

## 2018-03-09 MED ORDER — AMLODIPINE BESYLATE 5 MG PO TABS: 5.0000 mg | ORAL_TABLET | Freq: Every day | ORAL | Status: DC

## 2018-03-09 MED ORDER — FLUTICASONE PROPIONATE 50 MCG/ACT NA SUSP
2.0000 | Freq: Two times a day (BID) | NASAL | Status: DC | PRN
  Administered 2018-03-15: 10:00:00 2 via NASAL
  Filled 2018-03-09 (×3): qty 16

## 2018-03-09 MED ORDER — SODIUM CHLORIDE 0.9 % IV SOLN
INTRAVENOUS | Status: DC
  Administered 2018-03-09 – 2018-03-14 (×9): via INTRAVENOUS

## 2018-03-09 NOTE — Consult Note (Signed)
[place] [facility] Consult   Reason for Consult: Follow-up consult 82 year old woman with depression Referring Physician: Tressia Miners Patient Identification: Marilyn Andrews MRN:  [id number] Principal Diagnosis: Severe recurrent major depression without psychotic features [facility]) Diagnosis:   Patient Active Problem List   Diagnosis Date Noted  . Severe recurrent major depression without psychotic features ([place]) [F33.2] 03/08/2018  . Acute diverticulitis [K57.92] 03/07/2018  . Diverticulitis [K57.92] 07/01/2016    Total Time spent with patient: 20 minutes  Subjective:   Marilyn Andrews is a 82 y.o. female patient admitted with "I am no better".  HPI: Patient seen chart reviewed.  Patient still very blunted and her affect and feeling sick.  Chief complaint is her nausea.  She got Zofran and Phenergan today but thinks they did not do any good.  Mood still depressed and somewhat hopeless although denies any suicidal intent or plan.  Past Psychiatric History: History of recurrent depression  Risk to Self: Is patient at risk for suicide?: No Risk to Others:   Prior Inpatient Therapy:   Prior Outpatient Therapy:    Past Medical History:  Past Medical History:  Diagnosis Date  . Anxiety   . Arthritis   . Diverticulitis   . Hypertension     Past Surgical History:  Procedure Laterality Date  . ABDOMINAL HYSTERECTOMY    . APPENDECTOMY    . CHOLECYSTECTOMY    . HYSTERECTOMY ABDOMINAL WITH SALPINGECTOMY     Family History:  Family History  Problem Relation Age of Onset  . Hypertension Father   . CAD Brother    Family Psychiatric  History: None.  See previous note. Social History:  Social History   Substance and Sexual Activity  Alcohol Use No     Social History   Substance and Sexual Activity  Drug Use No    Social History   Socioeconomic History  . Marital status: Single    Spouse name: Not on file  . Number of children: Not on file  . Years of education: Not on  file  . Highest education level: Not on file  Occupational History  . Not on file  Social Needs  . Financial resource strain: Not on file  . Food insecurity:    Worry: Not on file    Inability: Not on file  . Transportation needs:    Medical: Not on file    Non-medical: Not on file  Tobacco Use  . Smoking status: Never Smoker  . Smokeless tobacco: Never Used  Substance and Sexual Activity  . Alcohol use: No  . Drug use: No  . Sexual activity: Not Currently  Lifestyle  . Physical activity:    Days per week: Not on file    Minutes per session: Not on file  . Stress: Not on file  Relationships  . Social connections:    Talks on phone: Not on file    Gets together: Not on file    Attends religious service: Not on file    Active member of club or organization: Not on file    Attends meetings of clubs or organizations: Not on file    Relationship status: Not on file  Other Topics Concern  . Not on file  Social History Narrative  . Not on file   Additional Social History:    Allergies:   Allergies  Allergen Reactions  . Penicillins Other (See Comments)    Reaction as a child, patient does not recall  Has patient had a PCN  reaction causing immediate rash, facial/tongue/throat swelling, SOB or lightheadedness with hypotension: unknown Has patient had a PCN reaction causing severe rash involving mucus membranes or skin necrosis: unknown Has patient had a PCN reaction that required hospitalization: unknown Has patient had a PCN reaction occurring within the last 10 years: No If all of the above answers are "NO", then may proceed with Cephalosporin use.  . Sulfa Antibiotics     Labs:  Results for orders placed or performed during the hospital encounter of 03/07/18 (from the past 48 hour(s))  Basic metabolic panel     Status: Abnormal   Collection Time: 03/08/18  4:10 AM  Result Value Ref Range   Sodium 139 135 - 145 mmol/L   Potassium 3.6 3.5 - 5.1 mmol/L   Chloride  104 101 - 111 mmol/L   CO2 30 22 - 32 mmol/L   Glucose, Bld 94 65 - 99 mg/dL   BUN 12 6 - 20 mg/dL   Creatinine, Ser 1.10 (H) 0.44 - 1.00 mg/dL   Calcium 7.8 (L) 8.9 - 10.3 mg/dL   GFR calc non Af Amer 46 (L) >60 mL/min   GFR calc Af Amer 53 (L) >60 mL/min    Comment: (NOTE) The eGFR has been calculated using the CKD EPI equation. This calculation has not been validated in all clinical situations. eGFR's persistently <60 mL/min signify possible Chronic Kidney Disease.    Anion gap 5 5 - 15    Comment: Performed at [facility], [address] [place], [place] [address]  CBC     Status: None   Collection Time: 03/08/18  4:10 AM  Result Value Ref Range   WBC 7.1 3.6 - 11.0 K/uL   RBC 4.63 3.80 - 5.20 MIL/uL   Hemoglobin 13.3 12.0 - 16.0 g/dL   HCT 39.4 35.0 - 47.0 %   MCV 85.1 80.0 - 100.0 fL   MCH 28.7 26.0 - 34.0 pg   MCHC 33.7 32.0 - 36.0 g/dL   RDW 14.2 11.5 - 14.5 %   Platelets 331 150 - 440 K/uL    Comment: Performed at [facility], [address] [place], [place] [address]  Urinalysis, Complete w Microscopic     Status: Abnormal   Collection Time: 03/08/18  6:17 PM  Result Value Ref Range   Color, Urine YELLOW (A) YELLOW   APPearance CLOUDY (A) CLEAR   Specific Gravity, Urine 1.014 1.005 - 1.030   pH 8.0 5.0 - 8.0   Glucose, UA NEGATIVE NEGATIVE mg/dL   Hgb urine dipstick NEGATIVE NEGATIVE   Bilirubin Urine NEGATIVE NEGATIVE   Ketones, ur NEGATIVE NEGATIVE mg/dL   Protein, ur NEGATIVE NEGATIVE mg/dL   Nitrite NEGATIVE NEGATIVE   Leukocytes, UA NEGATIVE NEGATIVE   RBC / HPF 0-5 0 - 5 RBC/hpf   WBC, UA 0-5 0 - 5 WBC/hpf   Bacteria, UA RARE (A) NONE SEEN   Squamous Epithelial / LPF NONE SEEN 0 - 5    Comment: Please note change in reference range.   Triple Phosphate Crystal PRESENT     Comment: Performed at [facility], [address] [place], [place] [address]    Current Facility-Administered Medications  Medication Dose Route  Frequency Provider Last Rate Last Dose  . 0.9 %  sodium chloride infusion   Intravenous Continuous Epifanio Lesches, MD 100 mL/hr at 03/09/18 1214    . acetaminophen (TYLENOL) tablet 650 mg  650 mg Oral Q6H PRN Hillary Bow, MD  Or  . acetaminophen (TYLENOL) suppository 650 mg  650 mg Rectal Q6H PRN Sudini, Alveta Heimlich, MD      . albuterol (PROVENTIL) (2.5 MG/3ML) 0.083% nebulizer solution 2.5 mg  2.5 mg Nebulization Q2H PRN Sudini, Alveta Heimlich, MD      . buPROPion [facility] SR) 12 hr tablet 100 mg  100 mg Oral Daily Clapacs, John T, MD   100 mg at 03/09/18 1033  . ciprofloxacin (CIPRO) IVPB 400 mg  400 mg Intravenous Q12H Sudini, Srikar, MD 200 mL/hr at 03/09/18 1705 400 mg at 03/09/18 1705  . enoxaparin (LOVENOX) injection 40 mg  40 mg Subcutaneous Q24H Hillary Bow, MD   40 mg at 03/08/18 2038  . escitalopram (LEXAPRO) tablet 20 mg  20 mg Oral Daily Clapacs, Madie Reno, MD   20 mg at 03/09/18 1033  . fluticasone (FLONASE) 50 MCG/ACT nasal spray 2 spray  2 spray Each Nare BID PRN Epifanio Lesches, MD      . hydrALAZINE (APRESOLINE) injection 10 mg  10 mg Intravenous Q6H PRN Sudini, Alveta Heimlich, MD      . HYDROcodone-acetaminophen (NORCO/VICODIN) 5-325 MG per tablet 1-2 tablet  1-2 tablet Oral Q4H PRN Hillary Bow, MD   2 tablet at 03/09/18 1521  . metroNIDAZOLE (FLAGYL) tablet 500 mg  500 mg Oral Q8H Sudini, Srikar, MD   500 mg at 03/09/18 1621  . morphine 2 MG/ML injection 2 mg  2 mg Intravenous Q4H PRN Hillary Bow, MD   2 mg at 03/08/18 1856  . ondansetron (ZOFRAN) injection 4 mg  4 mg Intravenous Q4H PRN Demetrios Loll, MD   4 mg at 03/09/18 1449   Or  . ondansetron (ZOFRAN) tablet 4 mg  4 mg Oral Q4H PRN Demetrios Loll, MD      . polyethylene glycol (MIRALAX / GLYCOLAX) packet 17 g  17 g Oral Daily PRN Sudini, Alveta Heimlich, MD      . promethazine (PHENERGAN) injection 12.5 mg  12.5 mg Intravenous Q6H PRN Hillary Bow, MD   12.5 mg at 03/09/18 1555    Musculoskeletal: Strength & Muscle Tone:  decreased Gait & Station: unable to stand Patient leans: N/A  Psychiatric Specialty Exam: Physical Exam  Constitutional: She appears well-developed and well-nourished.  HENT:  Head: Normocephalic and atraumatic.  Eyes: Pupils are equal, round, and reactive to light. Conjunctivae are normal.  Neck: Normal range of motion.  Cardiovascular: Normal heart sounds.  Respiratory: Effort normal.  GI: Soft.  Musculoskeletal: Normal range of motion.  Neurological: She is alert.  Skin: Skin is warm and dry.  Psychiatric: Judgment normal. Her affect is blunt. Her speech is delayed. She is slowed. Cognition and memory are normal. She expresses no suicidal ideation.    Review of Systems  Constitutional: Negative.   HENT: Negative.   Eyes: Negative.   Respiratory: Negative.   Cardiovascular: Negative.   Gastrointestinal: Positive for nausea.  Musculoskeletal: Negative.   Skin: Negative.   Neurological: Negative.   Psychiatric/Behavioral: Positive for depression. Negative for suicidal ideas.    Blood pressure (!) 147/57, pulse (!) 54, temperature 97.7 F (36.5 C), temperature source Oral, resp. rate 18, height 5' (1.524 m), weight 75.5 kg (166 lb 6.4 oz), SpO2 96 %.Body mass index is 32.5 kg/m.  General Appearance: Casual  Eye Contact:  Minimal  Speech:  Slow  Volume:  Decreased  Mood:  Dysphoric  Affect:  Blunt  Thought Process:  Goal Directed  Orientation:  Negative  Thought Content:  Logical  Suicidal Thoughts:  No  Homicidal Thoughts:  No  Memory:  Immediate;   Fair Recent;   Fair Remote;   Fair  Judgement:  Fair  Insight:  Fair  Psychomotor Activity:  Decreased  Concentration:  Concentration: Fair  Recall:  [employer] of Knowledge:  Fair  Language:  Fair  Akathisia:  No  Handed:  Right  AIMS (if indicated):     Assets:  Desire for Improvement  ADL's:  Impaired  Cognition:  Impaired,  Mild  Sleep:        Treatment Plan Summary: Medication management and Plan  Patient still quite depressed but no worse than yesterday not actively suicidal.  Will not change any of her antidepressant medicine.  Patient is already on what appears to be maximum doses of antinausea medicine.  Encouraged her to ask her nurse for them before he goes to sleep tonight.  I will follow-up as needed.  Disposition: Supportive therapy provided about ongoing stressors.  Alethia Berthold, MD 03/09/2018 7:41 PM

## 2018-03-09 NOTE — Plan of Care (Signed)

## 2018-03-09 NOTE — Progress Notes (Signed)
[facility] [place] [facility]   PATIENT NAME: Marilyn Andrews    MR#:  [id number]  DATE OF BIRTH:  09/02/1936  SUBJECTIVE: Patient is seen at bedside, still has nausea and slight left lower quadrant pain.  Patient has poor p.o. intake because of nausea.  CHIEF COMPLAINT:   Chief Complaint  Patient presents with  . Abdominal Pain   Patient is admitted for diverticulitis. REVIEW OF SYSTEMS:  Review of Systems  Constitutional: Negative for chills, fever and malaise/fatigue.  HENT: Negative for sore throat.   Eyes: Negative for blurred vision and double vision.  Respiratory: Negative for cough, hemoptysis, shortness of breath, wheezing and stridor.   Cardiovascular: Negative for chest pain, palpitations, orthopnea and leg swelling.  Gastrointestinal: Positive for abdominal pain and nausea. Negative for blood in stool, diarrhea, melena and vomiting.  Genitourinary: Negative for dysuria, flank pain and hematuria.  Musculoskeletal: Negative for back pain and joint pain.  Skin: Negative for rash.  Neurological: Negative for dizziness, sensory change, focal weakness, seizures, loss of consciousness, weakness and headaches.  Endo/Heme/Allergies: Negative for polydipsia.  Psychiatric/Behavioral: Positive for depression. The patient is nervous/anxious.     DRUG ALLERGIES:   Allergies  Allergen Reactions  . Penicillins Other (See Comments)    Reaction as a child, patient does not recall  Has patient had a PCN reaction causing immediate rash, facial/tongue/throat swelling, SOB or lightheadedness with hypotension: unknown Has patient had a PCN reaction causing severe rash involving mucus membranes or skin necrosis: unknown Has patient had a PCN reaction that required hospitalization: unknown Has patient had a PCN reaction occurring within the last 10 years: No If all of the above answers are "NO", then may proceed with Cephalosporin use.  . Sulfa Antibiotics     VITALS:  Blood pressure (!) 152/64, pulse 87, temperature 98 F (36.7 C), temperature source Oral, resp. rate (!) 21, height 5' (1.524 m), weight 75.5 kg (166 lb 6.4 oz), SpO2 95 %. PHYSICAL EXAMINATION:  Physical Exam  Constitutional: She is oriented to person, place, and time.  HENT:  Head: Normocephalic.  Mouth/Throat: Oropharynx is clear and moist.  Eyes: Pupils are equal, round, and reactive to light. Conjunctivae and EOM are normal. No scleral icterus.  Neck: Normal range of motion. Neck supple. No JVD present. No tracheal deviation present.  Cardiovascular: Normal rate, regular rhythm and normal heart sounds. Exam reveals no gallop.  No murmur heard. Pulmonary/Chest: Effort normal and breath sounds normal. No respiratory distress. She has no wheezes. She has no rales.  Abdominal: Soft. Bowel sounds are normal. She exhibits no distension. There is tenderness. There is no rebound.  Musculoskeletal: Normal range of motion. She exhibits no edema or tenderness.  Neurological: She is alert and oriented to person, place, and time. No cranial nerve deficit.  Skin: No rash noted. No erythema.  Psychiatric: She has a normal mood and affect.   LABORATORY PANEL:  Female CBC Recent Labs  Lab 03/08/18 0410  WBC 7.1  HGB 13.3  HCT 39.4  PLT 331   ------------------------------------------------------------------------------------------------------------------ Chemistries  Recent Labs  Lab 03/07/18 1244 03/08/18 0410  NA 138 139  K 3.7 3.6  CL 103 104  CO2 27 30  GLUCOSE 105* 94  BUN 11 12  CREATININE 1.10* 1.10*  CALCIUM 8.4* 7.8*  AST 21  --   ALT 9*  --   ALKPHOS 91  --   BILITOT 0.8  --    RADIOLOGY:  No results found.  ASSESSMENT AND PLAN:   * Acute descending and sigmoid diverticulitis with leukocytosis. Continue ciprofloxacin and flagyl Has nausea so continue Phenergan, Zofran, continue clear liquids and because of her nausea unable to advance the diet  today. Will need surgical referral as OP at discharge due to recurrent diverticulitis and chronic abd pain. May need partial colectomy.  Continue IV morphine for pain control. Leukocytosis improved.  * HTN'; continue Norvasc,   Depression and anxiety. ' she lost her sister 2 weeks ago, has been having depression, seen by psychiatry, started back on Lexapro,, Wellbutrin.  Deconditioning: Physical therapy consulted..   All the records are reviewed and case discussed with Care Management/Social Worker. Management plans discussed with the patient, family and they are in agreement.  CODE STATUS: DNR  TOTAL TIME TAKING CARE OF THIS PATIENT: 33 minutes.  More than 50% of the time was spent in counseling/coordination of care: YES  POSSIBLE D/C IN 2 DAYS, DEPENDING ON CLINICAL CONDITION.   Katha Hamming M.D on 03/09/2018 at 12:28 PM  Between 7am to 6pm - Pager - [phone]  After 6pm go to [email or web address] - Therapist, nutritional Hospitalists

## 2018-03-10 MED ORDER — PIPERACILLIN-TAZOBACTAM 3.375 G IVPB
3.3750 g | Freq: Three times a day (TID) | INTRAVENOUS | Status: DC
Start: 2018-03-10 — End: 2018-03-15
  Administered 2018-03-10 – 2018-03-15 (×15): 3.375 g via INTRAVENOUS
  Filled 2018-03-10 (×15): qty 50

## 2018-03-10 MED ORDER — BUPROPION HCL ER (SR) 150 MG PO TB12
150.0000 mg | ORAL_TABLET | Freq: Two times a day (BID) | ORAL | Status: DC
  Administered 2018-03-10 – 2018-03-11 (×2): 150 mg via ORAL
  Filled 2018-03-10 (×3): qty 1

## 2018-03-10 MED ORDER — METOCLOPRAMIDE HCL 5 MG/ML IJ SOLN
10.0000 mg | Freq: Four times a day (QID) | INTRAMUSCULAR | Status: DC
  Administered 2018-03-10 – 2018-03-15 (×20): 10 mg via INTRAVENOUS
  Filled 2018-03-10 (×21): qty 2

## 2018-03-10 NOTE — Care Management Important Message (Signed)
Important Message  Patient Details  Name: Marilyn Andrews MRN: [id number] Date of Birth: 04/15/1936   Medicare Important Message Given:  Yes    Marilyn Andrews A [name] 03/10/2018, 11:20 AM

## 2018-03-10 NOTE — Progress Notes (Signed)
PT Cancellation Note  Patient Details Name: Marilyn Andrews MRN: [id number] DOB: 17-Jan-1936   Cancelled Treatment:    Reason Eval/Treat Not Completed: Patient declined PT evaluation secondary to nausea, nursing notified.  Will attempt to see pt at a future date/time as medically appropriate.     Ovidio Hanger PT, DPT 03/10/18, 11:43 AM

## 2018-03-10 NOTE — Progress Notes (Signed)
Pharmacy Antibiotic Note  Marilyn Andrews is a 82 y.o. female admitted on 03/07/2018 with IAI.  Pharmacy has been consulted for Zosyn dosing.  Plan: Zosyn 3.375 gm IV Q8H EI. Patient has listed drug allergy to penicillins but it happened as a child and she does not recall the reaction. She has used cephalosporins and flouroquinolones in the past with no documented adverse reaction.   Height: 5' (152.4 cm) Weight: 170 lb 3.2 oz (77.2 kg) IBW/kg (Calculated) : 45.5  Temp (24hrs), Avg:97.9 F (36.6 C), Min:97.6 F (36.4 C), Max:98.1 F (36.7 C)  Recent Labs  Lab 03/07/18 1244 03/08/18 0410  WBC 13.1* 7.1  CREATININE 1.10* 1.10*    Estimated Creatinine Clearance: 37.5 mL/min (A) (by C-G formula based on SCr of 1.1 mg/dL (H)).    Allergies  Allergen Reactions  . Penicillins Other (See Comments)    Reaction as a child, patient does not recall  Has patient had a PCN reaction causing immediate rash, facial/tongue/throat swelling, SOB or lightheadedness with hypotension: unknown Has patient had a PCN reaction causing severe rash involving mucus membranes or skin necrosis: unknown Has patient had a PCN reaction that required hospitalization: unknown Has patient had a PCN reaction occurring within the last 10 years: No If all of the above answers are "NO", then may proceed with Cephalosporin use.  . Sulfa Antibiotics     Antimicrobials this admission: Cipro/Flagyl from 5/6 to 5/9 Flagyl 5/9 to present  Dose adjustments this admission:   Microbiology results: None from this admission  Thank you for allowing pharmacy to be a part of this patient's care.  Carola Frost, Pharm.D., BCPS Clinical Pharmacist 03/10/2018 9:52 AM

## 2018-03-10 NOTE — Consult Note (Signed)
[facility] Face-to-Face Psychiatry Consult   Reason for Consult: Follow-up consult 82 year old woman with diverticulitis and depression Referring Physician: Cherlynn Kaiser Patient Identification: Marilyn Andrews MRN:  [id number] Principal Diagnosis: Severe recurrent major depression without psychotic features [facility]) Diagnosis:   Patient Active Problem List   Diagnosis Date Noted  . Severe recurrent major depression without psychotic features (HCC) [F33.2] 03/08/2018  . Acute diverticulitis [K57.92] 03/07/2018  . Diverticulitis [K57.92] 07/01/2016    Total Time spent with patient: 20 minutes  Subjective:   Marilyn Andrews is a 82 y.o. female patient admitted with "I am still feeling bad".  HPI: Follow-up note from previous notes.  Patient continues to endorse abdominal pain and also depression and hopelessness sadness lack of energy.  Denies any suicidal thought.  Denies any hallucinations or psychosis.  Low energy and low motivation has not made much effort to find assistance when she goes home  Past Psychiatric History: Past history of recurrent depression  Risk to Self: Is patient at risk for suicide?: No Risk to Others:   Prior Inpatient Therapy:   Prior Outpatient Therapy:    Past Medical History:  Past Medical History:  Diagnosis Date  . Anxiety   . Arthritis   . Diverticulitis   . Hypertension     Past Surgical History:  Procedure Laterality Date  . ABDOMINAL HYSTERECTOMY    . APPENDECTOMY    . CHOLECYSTECTOMY    . HYSTERECTOMY ABDOMINAL WITH SALPINGECTOMY     Family History:  Family History  Problem Relation Age of Onset  . Hypertension Father   . CAD Brother    Family Psychiatric  History: See previous note Social History:  Social History   Substance and Sexual Activity  Alcohol Use No     Social History   Substance and Sexual Activity  Drug Use No    Social History   Socioeconomic History  . Marital status: Single    Spouse name: Not on file  . Number of  children: Not on file  . Years of education: Not on file  . Highest education level: Not on file  Occupational History  . Not on file  Social Needs  . Financial resource strain: Not on file  . Food insecurity:    Worry: Not on file    Inability: Not on file  . Transportation needs:    Medical: Not on file    Non-medical: Not on file  Tobacco Use  . Smoking status: Never Smoker  . Smokeless tobacco: Never Used  Substance and Sexual Activity  . Alcohol use: No  . Drug use: No  . Sexual activity: Not Currently  Lifestyle  . Physical activity:    Days per week: Not on file    Minutes per session: Not on file  . Stress: Not on file  Relationships  . Social connections:    Talks on phone: Not on file    Gets together: Not on file    Attends religious service: Not on file    Active member of club or organization: Not on file    Attends meetings of clubs or organizations: Not on file    Relationship status: Not on file  Other Topics Concern  . Not on file  Social History Narrative  . Not on file   Additional Social History:    Allergies:   Allergies  Allergen Reactions  . Penicillins Other (See Comments)    Reaction as a child, patient does not recall  Has patient had  a PCN reaction causing immediate rash, facial/tongue/throat swelling, SOB or lightheadedness with hypotension: unknown Has patient had a PCN reaction causing severe rash involving mucus membranes or skin necrosis: unknown Has patient had a PCN reaction that required hospitalization: unknown Has patient had a PCN reaction occurring within the last 10 years: No If all of the above answers are "NO", then may proceed with Cephalosporin use.  . Sulfa Antibiotics     Labs: No results found for this or any previous visit (from the past 48 hour(s)).  Current Facility-Administered Medications  Medication Dose Route Frequency Provider Last Rate Last Dose  . 0.9 %  sodium chloride infusion   Intravenous  Continuous Katha Hamming, MD 100 mL/hr at 03/10/18 1505    . acetaminophen (TYLENOL) tablet 650 mg  650 mg Oral Q6H PRN Milagros Loll, MD       Or  . acetaminophen (TYLENOL) suppository 650 mg  650 mg Rectal Q6H PRN Sudini, Srikar, MD      . albuterol (PROVENTIL) (2.5 MG/3ML) 0.083% nebulizer solution 2.5 mg  2.5 mg Nebulization Q2H PRN Sudini, Wardell Heath, MD      . buPROPion [facility] SR) 12 hr tablet 150 mg  150 mg Oral BID Berdie Malter T, MD      . enoxaparin (LOVENOX) injection 40 mg  40 mg Subcutaneous Q24H Milagros Loll, MD   40 mg at 03/09/18 2222  . escitalopram (LEXAPRO) tablet 20 mg  20 mg Oral Daily Djuna Frechette, [name], MD   20 mg at 03/10/18 1117  . fluticasone (FLONASE) 50 MCG/ACT nasal spray 2 spray  2 spray Each Nare BID PRN Katha Hamming, MD      . hydrALAZINE (APRESOLINE) injection 10 mg  10 mg Intravenous Q6H PRN Sudini, Wardell Heath, MD      . HYDROcodone-acetaminophen (NORCO/VICODIN) 5-325 MG per tablet 1-2 tablet  1-2 tablet Oral Q4H PRN Milagros Loll, MD   2 tablet at 03/10/18 1713  . metoCLOPramide (REGLAN) injection 10 mg  10 mg Intravenous Q6H Houston Siren, MD   10 mg at 03/10/18 1713  . morphine 2 MG/ML injection 2 mg  2 mg Intravenous Q4H PRN Milagros Loll, MD   2 mg at 03/08/18 1856  . ondansetron (ZOFRAN) injection 4 mg  4 mg Intravenous Q4H PRN Shaune Pollack, MD   4 mg at 03/10/18 1610   Or  . ondansetron (ZOFRAN) tablet 4 mg  4 mg Oral Q4H PRN Shaune Pollack, MD      . piperacillin-tazobactam (ZOSYN) IVPB 3.375 g  3.375 g Intravenous Q8H Houston Siren, MD   Stopped at 03/10/18 1512  . polyethylene glycol (MIRALAX / GLYCOLAX) packet 17 g  17 g Oral Daily PRN Sudini, Wardell Heath, MD      . promethazine (PHENERGAN) injection 12.5 mg  12.5 mg Intravenous Q6H PRN Milagros Loll, MD   12.5 mg at 03/10/18 1118    Musculoskeletal: Strength & Muscle Tone: decreased Gait & Station: unable to stand Patient leans: N/A  Psychiatric Specialty Exam: Physical Exam   Nursing note and vitals reviewed. Constitutional: She appears well-developed and well-nourished.  HENT:  Head: Normocephalic and atraumatic.  Eyes: Pupils are equal, round, and reactive to light. Conjunctivae are normal.  Neck: Normal range of motion.  Cardiovascular: Normal heart sounds.  Respiratory: Effort normal.  GI: Soft.  Musculoskeletal: Normal range of motion.  Neurological: She is alert.  Skin: Skin is warm and dry.  Psychiatric: She exhibits a depressed mood. She expresses no suicidal ideation.  Review of Systems  Constitutional: Negative.   HENT: Negative.   Eyes: Negative.   Respiratory: Negative.   Cardiovascular: Negative.   Gastrointestinal: Positive for abdominal pain.  Musculoskeletal: Negative.   Skin: Negative.   Neurological: Negative.   Psychiatric/Behavioral: Positive for depression. Negative for suicidal ideas.    Blood pressure (!) 129/57, pulse (!) 55, temperature 98.1 F (36.7 C), temperature source Oral, resp. rate 16, height 5' (1.524 m), weight 77.2 kg (170 lb 3.2 oz), SpO2 96 %.Body mass index is 33.24 kg/m.  General Appearance: Fairly Groomed  Eye Contact:  Fair  Speech:  Slow  Volume:  Decreased  Mood:  Depressed and Dysphoric  Affect:  Congruent  Thought Process:  Goal Directed  Orientation:  Full (Time, Place, and Person)  Thought Content:  Logical  Suicidal Thoughts:  No  Homicidal Thoughts:  No  Memory:  Immediate;   Fair Recent;   Fair Remote;   Fair  Judgement:  Fair  Insight:  Fair  Psychomotor Activity:  Decreased  Concentration:  Concentration: Fair  Recall:  [employer] of Knowledge:  Fair  Language:  Fair  Akathisia:  No  Handed:  Right  AIMS (if indicated):     Assets:  Communication Skills Housing  ADL's:  Impaired  Cognition:  WNL  Sleep:        Treatment Plan Summary: Medication management and Plan Patient with major depression continues to be very depressed but not actively suicidal and not psychotic.   Does not meet commitment criteria.  I discussed with the patient possibility of psychiatric hospitalization but she does not want to do that.  I have put in a social work consult as the patient's living situation at home may be very difficult in her condition and any assistance with home health or other suggestions would be helpful.  I have increased her Wellbutrin to 300 mg/day.  Disposition: Supportive therapy provided about ongoing stressors. Discussed crisis plan, support from social network, calling 911, coming to the Emergency Department, and calling Suicide Hotline.  Mordecai Rasmussen, MD 03/10/2018 6:33 PM

## 2018-03-10 NOTE — Progress Notes (Signed)
PT Cancellation Note  Patient Details Name: Marilyn Andrews MRN: [id number] DOB: 07-11-1936   Cancelled Treatment:    Reason Eval/Treat Not Completed: Patient declined in PM on two separate occasions secondary to nausea, nursing notified.  Will attempt to see pt at a future date as medically appropriate.     Ovidio Hanger PT, DPT 03/10/18, 3:33 PM

## 2018-03-10 NOTE — Progress Notes (Signed)
[employer] - [place] [facility]   PATIENT NAME: Marilyn Andrews    MR#:  [id number]  DATE OF BIRTH:  02-23-1936  SUBJECTIVE:   Patient here due to abdominal pain and nausea and noted to have acute diverticulitis.  Also severely depressed and continues to complain of persistent nausea with poor p.o. intake.  REVIEW OF SYSTEMS:    Review of Systems  Constitutional: Negative for chills and fever.  HENT: Negative for congestion and tinnitus.   Eyes: Negative for blurred vision and double vision.  Respiratory: Negative for cough, shortness of breath and wheezing.   Cardiovascular: Negative for chest pain, orthopnea and PND.  Gastrointestinal: Positive for nausea. Negative for abdominal pain, diarrhea and vomiting.  Genitourinary: Negative for dysuria and hematuria.  Neurological: Negative for dizziness, sensory change and focal weakness.  Psychiatric/Behavioral: Positive for depression.  All other systems reviewed and are negative.   Nutrition: Clear Liquids Tolerating Diet: Yes Tolerating PT: Await Eval.   DRUG ALLERGIES:   Allergies  Allergen Reactions  . Penicillins Other (See Comments)    Reaction as a child, patient does not recall  Has patient had a PCN reaction causing immediate rash, facial/tongue/throat swelling, SOB or lightheadedness with hypotension: unknown Has patient had a PCN reaction causing severe rash involving mucus membranes or skin necrosis: unknown Has patient had a PCN reaction that required hospitalization: unknown Has patient had a PCN reaction occurring within the last 10 years: No If all of the above answers are "NO", then may proceed with Cephalosporin use.  . Sulfa Antibiotics     VITALS:  Blood pressure 136/78, pulse (!) 53, temperature 97.6 F (36.4 C), temperature source Oral, resp. rate 16, height 5' (1.524 m), weight 77.2 kg (170 lb 3.2 oz), SpO2 93 %.  PHYSICAL EXAMINATION:   Physical Exam  GENERAL:  82 y.o.-year-old  patient lying in bed lethargic/depressed in NAD.  EYES: Pupils equal, round, reactive to light and accommodation. No scleral icterus. Extraocular muscles intact.  HEENT: Head atraumatic, normocephalic. Oropharynx and nasopharynx clear.  NECK:  Supple, no jugular venous distention. No thyroid enlargement, no tenderness.  LUNGS: Normal breath sounds bilaterally, no wheezing, rales, rhonchi. No use of accessory muscles of respiration.  CARDIOVASCULAR: S1, S2 normal. No murmurs, rubs, or gallops.  ABDOMEN: Soft, nontender, nondistended. Bowel sounds present. No organomegaly or mass.  EXTREMITIES: No cyanosis, clubbing or edema b/l.    NEUROLOGIC: Cranial nerves II through XII are intact. No focal Motor or sensory deficits b/l.   PSYCHIATRIC: The patient is alert and oriented x 3. Depressed. SKIN: No obvious rash, lesion, or ulcer.    LABORATORY PANEL:   CBC Recent Labs  Lab 03/08/18 0410  WBC 7.1  HGB 13.3  HCT 39.4  PLT 331   ------------------------------------------------------------------------------------------------------------------  Chemistries  Recent Labs  Lab 03/07/18 1244 03/08/18 0410  NA 138 139  K 3.7 3.6  CL 103 104  CO2 27 30  GLUCOSE 105* 94  BUN 11 12  CREATININE 1.10* 1.10*  CALCIUM 8.4* 7.8*  AST 21  --   ALT 9*  --   ALKPHOS 91  --   BILITOT 0.8  --    ------------------------------------------------------------------------------------------------------------------  Cardiac Enzymes No results for input(s): TROPONINI in the last 168 hours. ------------------------------------------------------------------------------------------------------------------  RADIOLOGY:  No results found.   ASSESSMENT AND PLAN:   82 year old female with past medical history of anxiety/depression, osteoarthritis, hypertension who was admitted to the hospital due to abdominal pain nausea vomiting and noted to have  acute diverticulitis.  1.  Acute diverticulitis- this  is the cause of patient's abdominal pain/nausea/vomiting. - Patient was on Cipro and Flagyl but having persistent nausea with the Flagyl.  Will change to IV Zosyn today. -Continue clear liquid diet, IV fluids, antiemetics and supportive care.  2.  Intractable nausea-secondary to diverticulitis complicated with underlying use of Flagyl. -We will change antibiotics to Zosyn, continue antiemetics with scheduled Reglan, continue as needed Phenergan and Zofran for now.  3.  Severe depression- patient denies any suicidal or homicidal ideations.  Seen by psychiatry.  Continue medical management with Lexapro, Wellbutrin  4. Essential HTN - cont. PRN hydralazine for now.     All the records are reviewed and case discussed with Care Management/Social Worker. Management plans discussed with the patient, family and they are in agreement.  CODE STATUS: Full code  DVT Prophylaxis: Lovenox  TOTAL TIME TAKING CARE OF THIS PATIENT: 30 minutes.   POSSIBLE D/C IN 2-3 DAYS, DEPENDING ON CLINICAL CONDITION.   Houston Siren M.D on 03/10/2018 at 2:02 PM  Between 7am to 6pm - Pager - [phone]  After 6pm go to [email or web address] - Therapist, nutritional Hospitalists  Office  [phone]  CC: Primary care physician; Center, [facility]

## 2018-03-11 MED ORDER — AMLODIPINE BESYLATE 5 MG PO TABS
5.0000 mg | ORAL_TABLET | Freq: Every day | ORAL | Status: DC
  Administered 2018-03-11 – 2018-03-15 (×5): 5 mg via ORAL
  Filled 2018-03-11 (×5): qty 1

## 2018-03-11 MED ORDER — BUPROPION HCL ER (SR) 150 MG PO TB12
150.0000 mg | ORAL_TABLET | Freq: Two times a day (BID) | ORAL | Status: DC
  Administered 2018-03-11 – 2018-03-15 (×8): 150 mg via ORAL
  Filled 2018-03-11 (×9): qty 1

## 2018-03-11 MED ORDER — DIPHENHYDRAMINE HCL 25 MG PO CAPS
25.0000 mg | ORAL_CAPSULE | Freq: Four times a day (QID) | ORAL | Status: DC | PRN
  Administered 2018-03-11 – 2018-03-14 (×2): 25 mg via ORAL
  Filled 2018-03-11 (×2): qty 1

## 2018-03-11 MED ORDER — BUPROPION HCL ER (SR) 150 MG PO TB12
150.0000 mg | ORAL_TABLET | Freq: Two times a day (BID) | ORAL | Status: DC
  Filled 2018-03-11: qty 1

## 2018-03-11 NOTE — Progress Notes (Signed)
Pharmacy Antibiotic Note  Marilyn Andrews is a 82 y.o. female admitted on 03/07/2018 with IAI.  Pharmacy has been consulted for Zosyn dosing.  Plan: Continue Zosyn 3.375 g IV Q8H EI.   Patient has listed drug allergy to penicillins but it happened as a child and she does not recall the reaction. She has used cephalosporins and flouroquinolones in the past with no documented adverse reaction.   Height: 5' (152.4 cm) Weight: 169 lb 9.6 oz (76.9 kg) IBW/kg (Calculated) : 45.5  Temp (24hrs), Avg:98 F (36.7 C), Min:97.9 F (36.6 C), Max:98.1 F (36.7 C)  Recent Labs  Lab 03/07/18 1244 03/08/18 0410  WBC 13.1* 7.1  CREATININE 1.10* 1.10*    Estimated Creatinine Clearance: 37.4 mL/min (A) (by C-G formula based on SCr of 1.1 mg/dL (H)).    Allergies  Allergen Reactions  . Penicillins Other (See Comments)    Reaction as a child, patient does not recall  Has patient had a PCN reaction causing immediate rash, facial/tongue/throat swelling, SOB or lightheadedness with hypotension: unknown Has patient had a PCN reaction causing severe rash involving mucus membranes or skin necrosis: unknown Has patient had a PCN reaction that required hospitalization: unknown Has patient had a PCN reaction occurring within the last 10 years: No If all of the above answers are "NO", then may proceed with Cephalosporin use.  . Sulfa Antibiotics     Antimicrobials this admission: Cipro/Flagyl from 5/6 to 5/9 Zosyn 5/9 to present  Dose adjustments this admission:   Microbiology results: None from this admission  Thank you for allowing pharmacy to be a part of this patient's care.  Marty Heck, Pharm.D., BCPS Clinical Pharmacist 03/11/2018 8:52 AM

## 2018-03-11 NOTE — Progress Notes (Signed)
Clinical Social Worker (CSW) presented bed offers to patient and she chose [facility]. [facility] liaison is aware of above. CSW contacted patient's daughter in law [place] and made her aware of above.   [employer], LCSW [phone]

## 2018-03-11 NOTE — Plan of Care (Signed)
  Problem: Education: Goal: Knowledge of General Education information will improve Outcome: Progressing   Problem: Health Behavior/Discharge Planning: Goal: Ability to manage health-related needs will improve Outcome: Progressing   Problem: Clinical Measurements: Goal: Ability to maintain clinical measurements within normal limits will improve Outcome: Progressing Goal: Will remain free from infection Outcome: Progressing Goal: Diagnostic test results will improve Outcome: Progressing Goal: Respiratory complications will improve Outcome: Progressing Goal: Cardiovascular complication will be avoided Outcome: Progressing   Problem: Activity: Goal: Risk for activity intolerance will decrease Outcome: Progressing   Problem: Nutrition: Goal: Adequate nutrition will be maintained Outcome: Progressing   Problem: Coping: Goal: Level of anxiety will decrease Outcome: Progressing   Problem: Elimination: Goal: Will not experience complications related to bowel motility Outcome: Progressing Goal: Will not experience complications related to urinary retention Outcome: Progressing   Problem: Safety: Goal: Ability to remain free from injury will improve Outcome: Progressing   Problem: Skin Integrity: Goal: Risk for impaired skin integrity will decrease Outcome: Progressing   

## 2018-03-11 NOTE — Clinical Social Work Placement (Signed)
   CLINICAL SOCIAL WORK PLACEMENT  NOTE  Date:  03/11/2018  Patient Details  Name: Palma Buster Woods MRN: [id number] Date of Birth: [birth year]  Clinical Social Work is seeking post-discharge placement for this patient at the Skilled  Nursing Facility level of care (*CSW will initial, date and re-position this form in  chart as items are completed):  Yes   Patient/family provided with [facility] Clinical Social Work Department's list of facilities offering this level of care within the geographic area requested by the patient (or if unable, by the patient's family).  Yes   Patient/family informed of their freedom to choose among providers that offer the needed level of care, that participate in Medicare, Medicaid or managed care program needed by the patient, have an available bed and are willing to accept the patient.  Yes   Patient/family informed of [facility]'s ownership interest in [facility] and [facility], as well as of the fact that they are under no obligation to receive care at these facilities.  PASRR submitted to EDS on       PASRR number received on       Existing PASRR number confirmed on 03/11/18     FL2 transmitted to all facilities in geographic area requested by pt/family on 03/11/18     FL2 transmitted to all facilities within larger geographic area on       Patient informed that his/her managed care company has contracts with or will negotiate with certain facilities, including the following:            Patient/family informed of bed offers received.  Patient chooses bed at       Physician recommends and patient chooses bed at      Patient to be transferred to   on  .  Patient to be transferred to facility by       Patient family notified on   of transfer.  Name of family member notified:        PHYSICIAN       Additional Comment:    _______________________________________________ Rylen Hou, Darleen Crocker, LCSW 03/11/2018, 11:32 AM

## 2018-03-11 NOTE — NC FL2 (Signed)
[place] MEDICAID FL2 LEVEL OF CARE SCREENING TOOL     IDENTIFICATION  Patient Name: Marilyn Andrews Birthdate: 1935-12-03 Sex: female Admission Date (Current Location): 03/07/2018  [place] and [place] Number:  Chiropodist and Address:  [facility], [address] [place], [place] [address]      Provider Number: [id number]  Attending Physician Name and Address:  Houston Siren, MD  Relative Name and Phone Number:       Current Level of Care: Hospital Recommended Level of Care: Skilled Nursing Facility Prior Approval Number:    Date Approved/Denied:   PASRR Number: ([id number] )  Discharge Plan: SNF    Current Diagnoses: Patient Active Problem List   Diagnosis Date Noted  . Severe recurrent major depression without psychotic features (HCC) 03/08/2018  . Acute diverticulitis 03/07/2018  . Diverticulitis 07/01/2016    Orientation RESPIRATION BLADDER Height & Weight     Self, Time, Situation, Place  Normal Incontinent Weight: 169 lb 9.6 oz (76.9 kg) Height:  5' (152.4 cm)  BEHAVIORAL SYMPTOMS/MOOD NEUROLOGICAL BOWEL NUTRITION STATUS      Continent Diet(Diet: Full Liquid to be advanced. )  AMBULATORY STATUS COMMUNICATION OF NEEDS Skin   Extensive Assist Verbally Normal                       Personal Care Assistance Level of Assistance  Bathing, Feeding, Dressing Bathing Assistance: Limited assistance Feeding assistance: Independent Dressing Assistance: Limited assistance     Functional Limitations Info  Sight, Hearing, Speech Sight Info: Adequate Hearing Info: Impaired Speech Info: Adequate    SPECIAL CARE FACTORS FREQUENCY  PT (By licensed PT), OT (By licensed OT)     PT Frequency: (5) OT Frequency: (5)            Contractures      Additional Factors Info  Code Status, Allergies Code Status Info: (DNR ) Allergies Info: (Penicillins, Sulfa Antibiotics)           Current Medications (03/11/2018):   This is the current hospital active medication list Current Facility-Administered Medications  Medication Dose Route Frequency Provider Last Rate Last Dose  . 0.9 %  sodium chloride infusion   Intravenous Continuous Katha Hamming, MD 100 mL/hr at 03/11/18 0308    . acetaminophen (TYLENOL) tablet 650 mg  650 mg Oral Q6H PRN Milagros Loll, MD       Or  . acetaminophen (TYLENOL) suppository 650 mg  650 mg Rectal Q6H PRN Sudini, Srikar, MD      . albuterol (PROVENTIL) (2.5 MG/3ML) 0.083% nebulizer solution 2.5 mg  2.5 mg Nebulization Q2H PRN Sudini, Wardell Heath, MD      . buPROPion [facility] SR) 12 hr tablet 150 mg  150 mg Oral BID Clapacs, [name], MD   150 mg at 03/11/18 0829  . diphenhydrAMINE (BENADRYL) capsule 25 mg  25 mg Oral Q6H PRN Cammy Copa, MD   25 mg at 03/11/18 0212  . enoxaparin (LOVENOX) injection 40 mg  40 mg Subcutaneous Q24H Milagros Loll, MD   40 mg at 03/10/18 2113  . escitalopram (LEXAPRO) tablet 20 mg  20 mg Oral Daily Clapacs, [name], MD   20 mg at 03/11/18 5621  . fluticasone (FLONASE) 50 MCG/ACT nasal spray 2 spray  2 spray Each Nare BID PRN Katha Hamming, MD      . hydrALAZINE (APRESOLINE) injection 10 mg  10 mg Intravenous Q6H PRN Milagros Loll, MD      .  HYDROcodone-acetaminophen (NORCO/VICODIN) 5-325 MG per tablet 1-2 tablet  1-2 tablet Oral Q4H PRN Milagros Loll, MD   2 tablet at 03/11/18 1308  . metoCLOPramide (REGLAN) injection 10 mg  10 mg Intravenous Q6H Houston Siren, MD   10 mg at 03/11/18 0540  . morphine 2 MG/ML injection 2 mg  2 mg Intravenous Q4H PRN Milagros Loll, MD   2 mg at 03/08/18 1856  . ondansetron (ZOFRAN) injection 4 mg  4 mg Intravenous Q4H PRN Shaune Pollack, MD   4 mg at 03/11/18 6578   Or  . ondansetron [facility]) tablet 4 mg  4 mg Oral Q4H PRN Shaune Pollack, MD      . piperacillin-tazobactam (ZOSYN) IVPB 3.375 g  3.375 g Intravenous Q8H Houston Siren, MD 12.5 mL/hr at 03/11/18 0540 3.375 g at 03/11/18 0540  . polyethylene glycol  (MIRALAX / GLYCOLAX) packet 17 g  17 g Oral Daily PRN Sudini, Wardell Heath, MD      . promethazine (PHENERGAN) injection 12.5 mg  12.5 mg Intravenous Q6H PRN Milagros Loll, MD   12.5 mg at 03/10/18 1118     Discharge Medications: Please see discharge summary for a list of discharge medications.  Relevant Imaging Results:  Relevant Lab Results:   Additional Information (SSN: [id number])  Catherina Pates, Darleen Crocker, LCSW

## 2018-03-11 NOTE — Evaluation (Signed)
Physical Therapy Evaluation Patient Details Name: Marilyn Andrews MRN: [id number] DOB: [birth year] Today's Date: 03/11/2018   History of Present Illness  Pt is an 82 y.o. female with a known history of HTN, diverticulitis, chronic abdominal pain here with worsening lower abd pain and nausea. Found to have acute diverticulitis of descending and sigmoid colon. No abscess. Elevated WBC and severe pain.  Pt being admitted to hospital.  Assessment includes: acute diverticulitis, intractable nausea, severe depression, and HTN.    Clinical Impression  Pt presents with deficits in strength, transfers, mobility, gait, balance, and activity tolerance.  Pt required extra time and effort with bed mobility tasks but no physical assistance.  Pt required min A during transfers from an elevated surface and presented with min instability upon initial stand with tremulous BLEs.  Pt only able to ambulate around 5' near the EOB/recliner for safety and required the use of a RW and min A for stability.  Pt's knees would buckle minimally occasionally during amb/standing with pt able to self correct.  Pt will benefit from PT services in a SNF setting upon discharge to safely address above deficits for decreased caregiver assistance and eventual return to PLOF.      Follow Up Recommendations SNF;Supervision for mobility/OOB    Equipment Recommendations  None recommended by PT    Recommendations for Other Services       Precautions / Restrictions Precautions Precautions: Fall Restrictions Weight Bearing Restrictions: No      Mobility  Bed Mobility Overal bed mobility: Modified Independent             General bed mobility comments: Extra time and effort but no physical assistance required with bed mobility tasks  Transfers Overall transfer level: Needs assistance Equipment used: Rolling walker (2 wheeled) Transfers: Sit to/from Stand Sit to Stand: Min assist;From elevated surface         General  transfer comment: Mod verbal cues for sequencing and min A to stand  Ambulation/Gait Ambulation/Gait assistance: Min assist Ambulation Distance (Feet): 5 Feet Assistive device: Rolling walker (2 wheeled) Gait Pattern/deviations: Step-to pattern Gait velocity: Decreased   General Gait Details: Pt unsteady with amb with min A for stability with LEs tremulous and at times with minor buckling with pt able to self-correct  Stairs            Wheelchair Mobility    Modified Rankin (Stroke Patients Only)       Balance Overall balance assessment: Needs assistance Sitting-balance support: Feet unsupported;Feet supported;No upper extremity supported Sitting balance-Leahy Scale: Good     Standing balance support: Bilateral upper extremity supported Standing balance-Leahy Scale: Poor Standing balance comment: Heavy reliance on the RW for stability along with occasional min A to prevent LOB                             Pertinent Vitals/Pain Pain Assessment: 0-10 Pain Score: 7  Pain Location: back pain Pain Descriptors / Indicators: Sore Pain Intervention(s): Premedicated before session;Monitored during session    Home Living Family/patient expects to be discharged to:: Private residence Living Arrangements: Alone Available Help at Discharge: Family;Available PRN/intermittently Type of Home: House Home Access: Stairs to enter Entrance Stairs-Rails: Right;Left;Can reach both Entrance Stairs-Number of Steps: 4 Home Layout: One level Home Equipment: Walker - 2 wheels;Cane - single point      Prior Function Level of Independence: Needs assistance   Gait / Transfers Assistance Needed: Ind Amb household distances  only with no AD, only leaves home for MD apts and uses a facility w/c for access, no fall history  ADL's / Homemaking Assistance Needed: Ind with ADLs with family assisting with bringing food to the home and with cleaning        Hand Dominance         Extremity/Trunk Assessment   Upper Extremity Assessment Upper Extremity Assessment: Generalized weakness    Lower Extremity Assessment Lower Extremity Assessment: Generalized weakness       Communication   Communication: HOH  Cognition Arousal/Alertness: Awake/alert Behavior During Therapy: Flat affect Overall Cognitive Status: Within Functional Limits for tasks assessed                                        General Comments      Exercises Total Joint Exercises Ankle Circles/Pumps: AROM;Both;10 reps Quad Sets: Strengthening;Both;10 reps Gluteal Sets: Strengthening;Both;10 reps Hip ABduction/ADduction: AROM;Both;5 reps Straight Leg Raises: AROM;Both;5 reps Long Arc Quad: AROM;Both;10 reps;15 reps Knee Flexion: AROM;Both;10 reps;15 reps Marching in Standing: AROM;Both;5 reps Other Exercises Other Exercises: HEP education and review with BLE APs, QS, and GS x 10 each 5-6x/day   Assessment/Plan    PT Assessment Patient needs continued PT services  PT Problem List Decreased strength;Decreased activity tolerance;Decreased balance;Decreased mobility;Decreased knowledge of use of DME       PT Treatment Interventions DME instruction;Gait training;Stair training;Functional mobility training;Balance training;Therapeutic exercise;Therapeutic activities;Patient/family education    PT Goals (Current goals can be found in the Care Plan section)  Acute Rehab PT Goals Patient Stated Goal: To walk better PT Goal Formulation: With patient Time For Goal Achievement: 03/24/18 Potential to Achieve Goals: Good    Frequency Min 2X/week   Barriers to discharge Inaccessible home environment;Decreased caregiver support      Co-evaluation               AM-PAC PT "6 Clicks" Daily Activity  Outcome Measure Difficulty turning over in bed (including adjusting bedclothes, sheets and blankets)?: A Little Difficulty moving from lying on back to sitting on the side  of the bed? : A Little Difficulty sitting down on and standing up from a chair with arms (e.g., wheelchair, bedside commode, etc,.)?: Unable Help needed moving to and from a bed to chair (including a wheelchair)?: A Little Help needed walking in hospital room?: A Lot Help needed climbing 3-5 steps with a railing? : Total 6 Click Score: 13    End of Session Equipment Utilized During Treatment: Gait belt Activity Tolerance: Patient limited by fatigue Patient left: in chair;with call bell/phone within reach;with chair alarm set Nurse Communication: Mobility status PT Visit Diagnosis: Unsteadiness on feet (R26.81);Difficulty in walking, not elsewhere classified (R26.2);Muscle weakness (generalized) (M62.81)    Time: 1610-9604 PT Time Calculation (min) (ACUTE ONLY): 33 min   Charges:   PT Evaluation $PT Eval Low Complexity: 1 Low PT Treatments $Therapeutic Exercise: 8-22 mins   PT G Codes:        DElly Modena PT, DPT 03/11/18, 1:46 PM

## 2018-03-11 NOTE — Clinical Social Work Note (Signed)
Clinical Social Work Assessment  Patient Details  Name: Marilyn Andrews MRN: [id number] Date of Birth: 11-22-1935  Date of referral:  03/11/18               Reason for consult:  Facility Placement                Permission sought to share information with:  Chartered certified accountant granted to share information::  Yes, Verbal Permission Granted  Name::      [place]::   [place]   Relationship::     Contact Information:     Housing/Transportation Living arrangements for the past 2 months:  [place] of Information:  Patient Patient Interpreter Needed:  None Criminal Activity/Legal Involvement Pertinent to Current Situation/Hospitalization:  No - Comment as needed Significant Relationships:  Adult Children Lives with:  Self Do you feel safe going back to the place where you live?  Yes Need for family participation in patient care:  Yes (Comment)  Care giving concerns: Patient lives in [place] alone.    Social Worker assessment / plan: Holiday representative (CSW) received SNF consult. PT is recommending SNF. CSW met with patient alone at bedside to discuss D/C plan. Patient was alert and oriented X4 and was sitting up in the chair at bedside. CSW introduced self and explained role of CSW department. Patient is hard of hearing however she was still able to participate in assessment. Patient reported that she lives alone in [place] and her 2 sons are her primary support. CSW explained SNF process and that medicare requires a 3 night qualifying inpatient stay in a hospital in order to pay for SNF. Patient was admitted to inpatient on 03/07/18. Patient verbalized her understanding and is agreeable to SNF search in [place]. Patient requested [facility] however CSW made her aware that per  [facility] admissions coordinator at [facility] she has no female beds. Patient reported that she has been to [facility] before  and did not like it. Per patient she called her son to come pick her up from [facility] after 1 night. FL2 complete and faxed out. CSW will continue to follow and assist as needed.   Employment status:  Disabled (Comment on whether or not currently receiving Disability), Retired Forensic scientist:  Medicare PT Recommendations:  [place] / Referral to community resources:  [place]  Patient/Family's Response to care: Patient is agreeable to [facility] in [place].   Patient/Family's Understanding of and Emotional Response to Diagnosis, Current Treatment, and Prognosis: Patient was very pleasant and thanked CSW for assistance.   Emotional Assessment Appearance:  Appears stated age Attitude/Demeanor/Rapport:    Affect (typically observed):  Accepting, Adaptable, Pleasant Orientation:  Oriented to Self, Oriented to Place, Oriented to  Time, Oriented to Situation Alcohol / Substance use:  Not Applicable Psych involvement (Current and /or in the community):  No (Comment)  Discharge Needs  Concerns to be addressed:  Discharge Planning Concerns Readmission within the last 30 days:  No Current discharge risk:  Dependent with Mobility Barriers to Discharge:  Continued Medical Work up   [employer], Veronia Beets, LCSW 03/11/2018, 11:33 AM

## 2018-03-11 NOTE — Progress Notes (Signed)
[facility] - [place] at [facility]   PATIENT NAME: Marilyn Andrews    MR#:  [id number]  DATE OF BIRTH:  [birth year]  SUBJECTIVE:   Nausea slightly improved since yesterday.  Patient tolerating a clear liquid diet.  Denies any abdominal pain.  Still seems quite depressed.  REVIEW OF SYSTEMS:    Review of Systems  Constitutional: Negative for chills and fever.  HENT: Negative for congestion and tinnitus.   Eyes: Negative for blurred vision and double vision.  Respiratory: Negative for cough, shortness of breath and wheezing.   Cardiovascular: Negative for chest pain, orthopnea and PND.  Gastrointestinal: Positive for nausea. Negative for abdominal pain, diarrhea and vomiting.  Genitourinary: Negative for dysuria and hematuria.  Neurological: Negative for dizziness, sensory change and focal weakness.  Psychiatric/Behavioral: Positive for depression.  All other systems reviewed and are negative.   Nutrition: Full liquids and advance as tolerated Tolerating Diet: Yes Tolerating PT: Await Eval.   DRUG ALLERGIES:   Allergies  Allergen Reactions  . Penicillins Other (See Comments)    Reaction as a child, patient does not recall  Has patient had a PCN reaction causing immediate rash, facial/tongue/throat swelling, SOB or lightheadedness with hypotension: unknown Has patient had a PCN reaction causing severe rash involving mucus membranes or skin necrosis: unknown Has patient had a PCN reaction that required hospitalization: unknown Has patient had a PCN reaction occurring within the last 10 years: No If all of the above answers are "NO", then may proceed with Cephalosporin use.  . Sulfa Antibiotics     VITALS:  Blood pressure (!) 161/66, pulse 64, temperature 97.9 F (36.6 C), temperature source Oral, resp. rate 18, height 5' (1.524 m), weight 76.9 kg (169 lb 9.6 oz), SpO2 93 %.  PHYSICAL EXAMINATION:   Physical Exam  GENERAL:  82 y.o.-year-old patient lying in  bed lethargic/depressed in NAD.  EYES: Pupils equal, round, reactive to light and accommodation. No scleral icterus. Extraocular muscles intact.  HEENT: Head atraumatic, normocephalic. Oropharynx and nasopharynx clear.  NECK:  Supple, no jugular venous distention. No thyroid enlargement, no tenderness.  LUNGS: Normal breath sounds bilaterally, no wheezing, rales, rhonchi. No use of accessory muscles of respiration.  CARDIOVASCULAR: S1, S2 normal. No murmurs, rubs, or gallops.  ABDOMEN: Soft, nontender, nondistended. Bowel sounds present. No organomegaly or mass.  EXTREMITIES: No cyanosis, clubbing or edema b/l.    NEUROLOGIC: Cranial nerves II through XII are intact. No focal Motor or sensory deficits b/l.  Globally weak.  PSYCHIATRIC: The patient is alert and oriented x 3. Depressed. SKIN: No obvious rash, lesion, or ulcer.    LABORATORY PANEL:   CBC Recent Labs  Lab 03/08/18 0410  WBC 7.1  HGB 13.3  HCT 39.4  PLT 331   ------------------------------------------------------------------------------------------------------------------  Chemistries  Recent Labs  Lab 03/07/18 1244 03/08/18 0410  NA 138 139  K 3.7 3.6  CL 103 104  CO2 27 30  GLUCOSE 105* 94  BUN 11 12  CREATININE 1.10* 1.10*  CALCIUM 8.4* 7.8*  AST 21  --   ALT 9*  --   ALKPHOS 91  --   BILITOT 0.8  --    ------------------------------------------------------------------------------------------------------------------  Cardiac Enzymes No results for input(s): TROPONINI in the last 168 hours. ------------------------------------------------------------------------------------------------------------------  RADIOLOGY:  No results found.   ASSESSMENT AND PLAN:   82 year old female with past medical history of anxiety/depression, osteoarthritis, hypertension who was admitted to the hospital due to abdominal pain nausea vomiting and noted to have acute  diverticulitis.  1.  Acute diverticulitis- this  is the cause of patient's abdominal pain/nausea/vomiting. -Nausea improved since yesterday.  Patient is off oral Flagyl now, continue IV Zosyn.  Will advance diet to full liquids and advance as tolerated. -Continue IV fluids, supportive care.  2.  Intractable nausea-secondary to diverticulitis complicated with underlying use of Flagyl. -Improved since yesterday.  Continue Reglan, as needed Phenergan, Zofran.  3.  Severe depression- patient denies any suicidal or homicidal ideations.  Seen by psychiatry.  Continue medical management with Lexapro, Wellbutrin and dose increased by Psych  4. Essential HTN - cont. PRN hydralazine for now.   Seen by physical therapy and they recommend short-term rehab.  Social work made aware.  All the records are reviewed and case discussed with Care Management/Social Worker. Management plans discussed with the patient, family and they are in agreement.  CODE STATUS: Full code  DVT Prophylaxis: Lovenox  TOTAL TIME TAKING CARE OF THIS PATIENT: 30 minutes.   POSSIBLE D/C IN 2-3 DAYS, DEPENDING ON CLINICAL CONDITION.   Houston Siren M.D on 03/11/2018 at 1:48 PM  Between 7am to 6pm - Pager - [phone]  After 6pm go to [email or web address] - Therapist, nutritional Hospitalists  Office  [phone]  CC: Primary care physician; Center, [facility]

## 2018-03-12 LAB — CBC
HCT: 40 % (ref 35.0–47.0)
Hemoglobin: 13.5 g/dL (ref 12.0–16.0)
MCH: 29.1 pg (ref 26.0–34.0)
MCHC: 33.7 g/dL (ref 32.0–36.0)
MCV: 86.2 fL (ref 80.0–100.0)
Platelets: 313 10*3/uL (ref 150–440)
RBC: 4.65 MIL/uL (ref 3.80–5.20)
RDW: 14.5 % (ref 11.5–14.5)
WBC: 5.3 10*3/uL (ref 3.6–11.0)

## 2018-03-12 LAB — BASIC METABOLIC PANEL
Anion gap: 5 (ref 5–15)
BUN: <5 mg/dL — ABNORMAL LOW (ref 6–20)
CO2: 26 mmol/L (ref 22–32)
Calcium: 7.6 mg/dL — ABNORMAL LOW (ref 8.9–10.3)
Chloride: 106 mmol/L (ref 101–111)
Creatinine, Ser: 0.77 mg/dL (ref 0.44–1.00)
GFR calc Af Amer: >60 mL/min (ref >60)
GFR calc non Af Amer: >60 mL/min (ref >60)
Glucose, Bld: 89 mg/dL (ref 65–99)
Potassium: 3.2 mmol/L — ABNORMAL LOW (ref 3.5–5.1)
Sodium: 137 mmol/L (ref 135–145)

## 2018-03-12 MED ORDER — PROCHLORPERAZINE EDISYLATE 10 MG/2ML IJ SOLN: 10.0000 mg | Freq: Three times a day (TID) | INTRAMUSCULAR | Status: DC | PRN

## 2018-03-12 MED ORDER — FAMOTIDINE IN NACL 20-0.9 MG/50ML-% IV SOLN
20.0000 mg | Freq: Two times a day (BID) | INTRAVENOUS | Status: DC
  Administered 2018-03-12 – 2018-03-15 (×6): 20 mg via INTRAVENOUS
  Filled 2018-03-12 (×6): qty 50

## 2018-03-12 MED ORDER — PROCHLORPERAZINE EDISYLATE 10 MG/2ML IJ SOLN
5.0000 mg | Freq: Three times a day (TID) | INTRAMUSCULAR | Status: DC | PRN
  Administered 2018-03-12 – 2018-03-14 (×3): 5 mg via INTRAVENOUS
  Filled 2018-03-12 (×3): qty 1

## 2018-03-12 NOTE — Progress Notes (Signed)
[facility] - [place] at [facility]   PATIENT NAME: Marilyn Andrews    MR#:  [id number]  DATE OF BIRTH:  [birth year]  Nausea is worse after dinner last night. Her abdominal pain is better.  C/o constipation   REVIEW OF SYSTEMS:    Review of Systems  Constitutional: Negative for chills and fever.  HENT: Negative for congestion, hearing loss and tinnitus.   Eyes: Negative for blurred vision, double vision and photophobia.  Respiratory: Negative for cough, hemoptysis, shortness of breath and wheezing.   Cardiovascular: Negative for chest pain, palpitations, orthopnea, leg swelling and PND.  Gastrointestinal: Positive for nausea. Negative for abdominal pain, diarrhea and vomiting.  Genitourinary: Negative for dysuria, hematuria and urgency.  Musculoskeletal: Negative for myalgias and neck pain.  Skin: Negative for rash.  Neurological: Negative for dizziness, sensory change, focal weakness, seizures, weakness and headaches.  Psychiatric/Behavioral: Positive for depression. Negative for memory loss. The patient does not have insomnia.   All other systems reviewed and are negative.   Nutrition: Full liquids and advance as tolerated Tolerating Diet: Yes Tolerating PT: Await Eval.   DRUG ALLERGIES:   Allergies  Allergen Reactions  . Penicillins Other (See Comments)    Reaction as a child, patient does not recall  Has patient had a PCN reaction causing immediate rash, facial/tongue/throat swelling, SOB or lightheadedness with hypotension: unknown Has patient had a PCN reaction causing severe rash involving mucus membranes or skin necrosis: unknown Has patient had a PCN reaction that required hospitalization: unknown Has patient had a PCN reaction occurring within the last 10 years: No If all of the above answers are "NO", then may proceed with Cephalosporin use.  . Sulfa Antibiotics     VITALS:  Blood pressure (!) 155/70, pulse 62, temperature 97.6 F (36.4 C),  temperature source Oral, resp. rate 18, height 5' (1.524 m), weight 76.1 kg (167 lb 11.2 oz), SpO2 96 %.  PHYSICAL EXAMINATION:   Physical Exam  GENERAL:  82 y.o.-year-old patient lying in bed lethargic/depressed in NAD.  EYES: Pupils equal, round, reactive to light and accommodation. No scleral icterus. Extraocular muscles intact.  HEENT: Head atraumatic, normocephalic. Oropharynx and nasopharynx clear.  NECK:  Supple, no jugular venous distention. No thyroid enlargement, no tenderness.  LUNGS: Normal breath sounds bilaterally, no wheezing, rales, rhonchi. No use of accessory muscles of respiration.  CARDIOVASCULAR: S1, S2 normal. No murmurs, rubs, or gallops.  ABDOMEN: Soft, nontender, nondistended. Bowel sounds present. No organomegaly or mass.  EXTREMITIES: No cyanosis, clubbing or edema b/l.    NEUROLOGIC: Cranial nerves II through XII are intact. No focal Motor or sensory deficits b/l.  Globally weak.  PSYCHIATRIC: The patient is alert and oriented x 3. Depressed. SKIN: No obvious rash, lesion, or ulcer.    LABORATORY PANEL:   CBC Recent Labs  Lab 03/12/18 0518  WBC 5.3  HGB 13.5  HCT 40.0  PLT 313   ------------------------------------------------------------------------------------------------------------------  Chemistries  Recent Labs  Lab 03/07/18 1244  03/12/18 0518  NA 138   < > 137  K 3.7   < > 3.2*  CL 103   < > 106  CO2 27   < > 26  GLUCOSE 105*   < > 89  BUN 11   < > <5*  CREATININE 1.10*   < > 0.77  CALCIUM 8.4*   < > 7.6*  AST 21  --   --   ALT 9*  --   --   ALKPHOS 91  --   --  BILITOT 0.8  --   --    < > = values in this interval not displayed.   ------------------------------------------------------------------------------------------------------------------  Cardiac Enzymes No results for input(s): TROPONINI in the last 168  hours. ------------------------------------------------------------------------------------------------------------------  RADIOLOGY:  No results found.   ASSESSMENT AND PLAN:   82 year old female with past medical history of anxiety/depression, osteoarthritis, hypertension who was admitted to the hospital due to abdominal pain nausea vomiting and noted to have acute diverticulitis.  1.  Acute diverticulitis- this is the cause of patient's abdominal pain/nausea/vomiting.still Nauseous,so We stopped flagyl,but she started to have severe NAusea again,will discontinue IV morphine and use IV zofran before meals,continue IV fluids,IV PPI and see how it will be today.pt understands and agrees fot this plan. - 2.  Intractable nausea-secondary to diverticulitis complicated with underlying use of Flagyl.  Continue Reglan, as needed Phenergan, Zofran.  3.  Severe depression- patient denies any suicidal or homicidal ideations.  Seen by psychiatry.  Continue medical management with Lexapro, Wellbutrin and dose increased by Psych  4. Essential HTN - cont. PRN hydralazine for now.   Seen by physical therapy and they recommend short-term rehab.  Social work made aware.  All the records are reviewed and case discussed with Care Management/Social Worker. Management plans discussed with the patient, family and they are in agreement.  CODE STATUS: Full code  DVT Prophylaxis: Lovenox  TOTAL TIME TAKING CARE OF THIS PATIENT: 30 minutes.   POSSIBLE D/C IN 2-3 DAYS, DEPENDING ON CLINICAL CONDITION.   Katha Hamming M.D on 03/12/2018 at 8:39 AM  Between 7am to 6pm - Pager - [phone]  After 6pm go to [email or web address] - Therapist, nutritional Hospitalists  Office  [phone]  CC: Primary care physician; Center, [facility]

## 2018-03-13 MED ORDER — SUCRALFATE 1 GM/10ML PO SUSP
1.0000 g | Freq: Three times a day (TID) | ORAL | Status: DC
  Administered 2018-03-13 – 2018-03-14 (×3): 1 g via ORAL
  Filled 2018-03-13 (×6): qty 10

## 2018-03-13 MED ORDER — LORAZEPAM 2 MG/ML IJ SOLN
0.5000 mg | Freq: Four times a day (QID) | INTRAMUSCULAR | Status: DC | PRN
  Administered 2018-03-13: 23:00:00 0.5 mg via INTRAVENOUS
  Filled 2018-03-13: qty 1

## 2018-03-13 NOTE — Progress Notes (Addendum)
[facility] -  at [facility]   PATIENT NAME: Marilyn Andrews    MR#:  [id number]  DATE OF BIRTH:  [birth year]  Patient still complains of nausea, burning in stomach and also chest.  She is on IV Pepcid.  No constipation.  No abdominal pain.  But nausea is her main complaint and not able to keep things down.  REVIEW OF SYSTEMS:    Review of Systems  Constitutional: Negative for chills and fever.  HENT: Negative for congestion, hearing loss and tinnitus.   Eyes: Negative for blurred vision, double vision and photophobia.  Respiratory: Negative for cough, hemoptysis, shortness of breath and wheezing.   Cardiovascular: Negative for chest pain, palpitations, orthopnea, leg swelling and PND.  Gastrointestinal: Positive for nausea. Negative for abdominal pain, diarrhea and vomiting.  Genitourinary: Negative for dysuria, hematuria and urgency.  Musculoskeletal: Negative for myalgias and neck pain.  Skin: Negative for rash.  Neurological: Negative for dizziness, sensory change, focal weakness, seizures, weakness and headaches.  Psychiatric/Behavioral: Positive for depression. Negative for memory loss. The patient does not have insomnia.   All other systems reviewed and are negative.   Nutrition: Full liquids and advance as tolerated Tolerating Diet: Yes Tolerating PT: Await Eval.   DRUG ALLERGIES:   Allergies  Allergen Reactions  . Penicillins Other (See Comments)    Reaction as a child, patient does not recall  Has patient had a PCN reaction causing immediate rash, facial/tongue/throat swelling, SOB or lightheadedness with hypotension: unknown Has patient had a PCN reaction causing severe rash involving mucus membranes or skin necrosis: unknown Has patient had a PCN reaction that required hospitalization: unknown Has patient had a PCN reaction occurring within the last 10 years: No If all of the above answers are "NO", then may proceed with Cephalosporin use.  .  Sulfa Antibiotics     VITALS:  Blood pressure (!) 181/75, pulse (!) 101, temperature 98.2 F (36.8 C), temperature source Oral, resp. rate 16, height 5' (1.524 m), weight 77.4 kg (170 lb 11.2 oz), SpO2 93 %.  PHYSICAL EXAMINATION:   Physical Exam  GENERAL:  82 y.o.-year-old patient lying in bed lethargic/depressed in NAD.  EYES: Pupils equal, round, reactive to light and accommodation. No scleral icterus. Extraocular muscles intact.  HEENT: Head atraumatic, normocephalic. Oropharynx and nasopharynx clear.  NECK:  Supple, no jugular venous distention. No thyroid enlargement, no tenderness.  LUNGS: Normal breath sounds bilaterally, no wheezing, rales, rhonchi. No use of accessory muscles of respiration.  CARDIOVASCULAR: S1, S2 normal. No murmurs, rubs, or gallops.  ABDOMEN: Soft, nontender, nondistended. Bowel sounds present. No organomegaly or mass.  EXTREMITIES: No cyanosis, clubbing or edema b/l.    NEUROLOGIC: Cranial nerves II through XII are intact. No focal Motor or sensory deficits b/l.  Globally weak.  PSYCHIATRIC: The patient is alert and oriented x 3. Depressed. SKIN: No obvious rash, lesion, or ulcer.    LABORATORY PANEL:   CBC Recent Labs  Lab 03/12/18 0518  WBC 5.3  HGB 13.5  HCT 40.0  PLT 313   ------------------------------------------------------------------------------------------------------------------  Chemistries  Recent Labs  Lab 03/07/18 1244  03/12/18 0518  NA 138   < > 137  K 3.7   < > 3.2*  CL 103   < > 106  CO2 27   < > 26  GLUCOSE 105*   < > 89  BUN 11   < > <5*  CREATININE 1.10*   < > 0.77  CALCIUM 8.4*   < >  7.6*  AST 21  --   --   ALT 9*  --   --   ALKPHOS 91  --   --   BILITOT 0.8  --   --    < > = values in this interval not displayed.   ------------------------------------------------------------------------------------------------------------------  Cardiac Enzymes No results for input(s): TROPONINI in the last 168  hours. ------------------------------------------------------------------------------------------------------------------  RADIOLOGY:  No results found.   ASSESSMENT AND PLAN:   82 year old female with past medical history of anxiety/depression, osteoarthritis, hypertension who was admitted to the hospital due to abdominal pain nausea vomiting and noted to have acute diverticulitis.  1.  Acute diverticulitis- slowly improving, patient abdominal pain improved.  But nausea, vomiting persisting.  Continue IV nausea medicine patient is already on Reglan, Zofran,  we stop her Phenergan yesterday, started on Compazine.  Patient still has nausea so I added Carafate today.- 2.  Intractable nausea-secondary to diverticulitis complicated with underlying use of Flagyl.  Continue Reglan,, use Zofran especially before each meal.  3.  Severe depression- patient denies any suicidal or homicidal ideations.  Seen by psychiatry.  Continue medical management with Lexapro, Wellbutrin and dose increased by Psych  4. Essential HTN - cont. PRN hydralazine for now.   Seen by physical therapy and they recommend short-term rehab.  Social work made aware. #5 acute gastritis, continue IV PPIs, added Carafate today.  Please see if it helps if not will get GI consult tomorrow. All the records are reviewed and case discussed with Care Management/Social Worker. Management plans discussed with the patient, family and they are in agreement.  CODE STATUS: Full code  DVT Prophylaxis: Lovenox  TOTAL TIME TAKING CARE OF THIS PATIENT: 32 minutes.   POSSIBLE D/C IN 2-3 DAYS, DEPENDING ON CLINICAL CONDITION.   Katha Hamming M.D on 03/13/2018 at 1:47 PM  Between 7am to 6pm - Pager - [phone]  After 6pm go to [email or web address] - Therapist, nutritional Hospitalists  Office  [phone]  CC: Primary care physician; Center, [facility]

## 2018-03-14 LAB — CREATININE, SERUM
Creatinine, Ser: 0.8 mg/dL (ref 0.44–1.00)
GFR calc Af Amer: >60 mL/min (ref >60)
GFR calc non Af Amer: >60 mL/min (ref >60)

## 2018-03-14 MED ORDER — ESCITALOPRAM OXALATE 20 MG PO TABS: 20.0000 mg | ORAL_TABLET | Freq: Every day | ORAL | 0 refills | Status: AC

## 2018-03-14 MED ORDER — BUPROPION HCL ER (SR) 150 MG PO TB12: 150.0000 mg | ORAL_TABLET | Freq: Two times a day (BID) | ORAL | 0 refills | Status: DC

## 2018-03-14 MED ORDER — SUCRALFATE 1 GM/10ML PO SUSP: 1.0000 g | Freq: Three times a day (TID) | ORAL | 0 refills | Status: DC

## 2018-03-14 MED ORDER — ONDANSETRON 4 MG PO TBDP: 4.0000 mg | ORAL_TABLET | Freq: Three times a day (TID) | ORAL | 0 refills | Status: DC | PRN

## 2018-03-14 NOTE — Clinical Social Work Note (Signed)
Patient is not medically stable for discharge yet. CSW following for discharge needs. Patient plans to discharge to [facility] when medically stable. CSW will continue to follow.   Ruthe Mannan MSW, LCSWA [phone]

## 2018-03-14 NOTE — Progress Notes (Signed)
PT Cancellation Note  Patient Details Name: Marilyn Andrews MRN: [id number] DOB: [birth year]   Cancelled Treatment:    Reason Eval/Treat Not Completed: Patient declined PT this date secondary to fatigue.  Pt educated on physiological benefits of activity and suggested bed therex to start with to see if pt would feel less fatigued with pt again refusing.  Will attempt to see pt at a future date as medically appropriate.     Ovidio Hanger PT, DPT 03/14/18, 2:14 PM

## 2018-03-14 NOTE — Discharge Planning (Signed)
Patient changed her mind and is agreeing to rehab at discharge. Dr. Text to inform of patient's choice.

## 2018-03-14 NOTE — Progress Notes (Signed)
   03/14/18 1315  Clinical Encounter Type  Visited With Patient  Visit Type Initial  Referral From Nurse  Consult/Referral To Chaplain  Spiritual Encounters  Spiritual Needs Emotional   CH received an OR to talk with PT who is depressed due to death of spouse 2 weeks ago. CH offered support and comfort to PT. PT was not interested in having a discussion with CH. CH will follow up as needed.

## 2018-03-14 NOTE — Progress Notes (Signed)
[facility] - [place] at [facility]   PATIENT NAME: Marilyn Andrews    MR#:  [id number]  DATE OF BIRTH:  1936-07-29   less nausea today and eager to eat regular food today.  REVIEW OF SYSTEMS:    Review of Systems  Constitutional: Negative for chills and fever.  HENT: Negative for congestion, hearing loss and tinnitus.   Eyes: Negative for blurred vision, double vision and photophobia.  Respiratory: Negative for cough, hemoptysis, shortness of breath and wheezing.   Cardiovascular: Negative for chest pain, palpitations, orthopnea, leg swelling and PND.  Gastrointestinal: Positive for nausea. Negative for abdominal pain, diarrhea and vomiting.  Genitourinary: Negative for dysuria, hematuria and urgency.  Musculoskeletal: Negative for myalgias and neck pain.  Skin: Negative for rash.  Neurological: Negative for dizziness, sensory change, focal weakness, seizures, weakness and headaches.  Psychiatric/Behavioral: Positive for depression. Negative for memory loss. The patient does not have insomnia.   All other systems reviewed and are negative.   Nutrition: Full liquids and advance as tolerated Tolerating Diet: Yes Tolerating PT: Await Eval.   DRUG ALLERGIES:   Allergies  Allergen Reactions  . Penicillins Other (See Comments)    Reaction as a child, patient does not recall  Has patient had a PCN reaction causing immediate rash, facial/tongue/throat swelling, SOB or lightheadedness with hypotension: unknown Has patient had a PCN reaction causing severe rash involving mucus membranes or skin necrosis: unknown Has patient had a PCN reaction that required hospitalization: unknown Has patient had a PCN reaction occurring within the last 10 years: No If all of the above answers are "NO", then may proceed with Cephalosporin use.  . Sulfa Antibiotics     VITALS:  Blood pressure (!) 148/72, pulse 78, temperature 98.2 F (36.8 C), temperature source Oral, resp. rate 18,  height 5' (1.524 m), weight 74.3 kg (163 lb 12.8 oz), SpO2 92 %.  PHYSICAL EXAMINATION:   Physical Exam  GENERAL:  82 y.o.-year-old patient lying in bed lethargic/depressed in NAD.  EYES: Pupils equal, round, reactive to light and accommodation. No scleral icterus. Extraocular muscles intact.  HEENT: Head atraumatic, normocephalic. Oropharynx and nasopharynx clear.  NECK:  Supple, no jugular venous distention. No thyroid enlargement, no tenderness.  LUNGS: Normal breath sounds bilaterally, no wheezing, rales, rhonchi. No use of accessory muscles of respiration.  CARDIOVASCULAR: S1, S2 normal. No murmurs, rubs, or gallops.  ABDOMEN: Soft, nontender, nondistended. Bowel sounds present. No organomegaly or mass.  EXTREMITIES: No cyanosis, clubbing or edema b/l.    NEUROLOGIC: Cranial nerves II through XII are intact. No focal Motor or sensory deficits b/l.  Globally weak.  PSYCHIATRIC: The patient is alert and oriented x 3. Depressed. SKIN: No obvious rash, lesion, or ulcer.    LABORATORY PANEL:   CBC Recent Labs  Lab 03/12/18 0518  WBC 5.3  HGB 13.5  HCT 40.0  PLT 313   ------------------------------------------------------------------------------------------------------------------  Chemistries  Recent Labs  Lab 03/12/18 0518 03/14/18 0407  NA 137  --   K 3.2*  --   CL 106  --   CO2 26  --   GLUCOSE 89  --   BUN <5*  --   CREATININE 0.77 0.80  CALCIUM 7.6*  --    ------------------------------------------------------------------------------------------------------------------  Cardiac Enzymes No results for input(s): TROPONINI in the last 168 hours. ------------------------------------------------------------------------------------------------------------------  RADIOLOGY:  No results found.   ASSESSMENT AND PLAN:   82 year old female with past medical history of anxiety/depression, osteoarthritis, hypertension who was admitted to the hospital  due to abdominal  pain nausea vomiting and noted to have acute diverticulitis.  1.  Acute diverticulitis- slowly improving,   Nausea today, eager to eat regular food.  Continue PPI, Carafate, advance the diet and see how she tolerates the diet.  Likely discharge to [facility]  3.  Severe depression- patient denies any suicidal or homicidal ideations.  Seen by psychiatry.  Continue medical management with Lexapro, Wellbutrin and dose increased by Psych  seen by by hospital chaplain today, offered assistance. 4. Essential HTN - cont. PRN hydralazine for now.   Seen by physical therapy and they recommend short-term rehab.  Social work made aware.  Likely discharge to [facility] tomorrow. #5 acute gastritis, continue IV PPIs, continue Carafate, patient clinically improving..  Please see if it helps if not will get GI consult tomorrow. All the records are reviewed and case discussed with Care Management/Social Worker. Management plans discussed with the patient, family and they are in agreement.  CODE STATUS: Full code  DVT Prophylaxis: Lovenox  TOTAL TIME TAKING CARE OF THIS PATIENT: 32 minutes.   POSSIBLE D/C IN 2-3 DAYS, DEPENDING ON CLINICAL CONDITION.   Katha Hamming M.D on 03/14/2018 at 2:04 PM  Between 7am to 6pm - Pager - [phone]  After 6pm go to [email or web address] - Therapist, nutritional Hospitalists  Office  [phone]  CC: Primary care physician; Center, [facility]

## 2018-03-14 NOTE — Care Management Important Message (Signed)
Copy of signed IM left in patient's room.    

## 2018-03-14 NOTE — Plan of Care (Signed)
  Problem: Health Behavior/Discharge Planning: Goal: Ability to manage health-related needs will improve Outcome: Not Progressing   Problem: Nutrition: Goal: Adequate nutrition will be maintained Outcome: Not Progressing   Problem: Coping: Goal: Level of anxiety will decrease Outcome: Not Progressing   Problem: Pain Managment: Goal: General experience of comfort will improve Outcome: Not Progressing      

## 2018-03-15 MED ORDER — FAMOTIDINE 20 MG PO TABS: 20.0000 mg | ORAL_TABLET | Freq: Two times a day (BID) | ORAL | 1 refills | Status: DC

## 2018-03-15 NOTE — Clinical Social Work Placement (Signed)
   CLINICAL SOCIAL WORK PLACEMENT  NOTE  Date:  03/15/2018  Patient Details  Name: Nykerria Macconnell Harig MRN: [id number] Date of Birth: 1936-08-26  Clinical Social Work is seeking post-discharge placement for this patient at the Skilled  Nursing Facility level of care (*CSW will initial, date and re-position this form in  chart as items are completed):  Yes   Patient/family provided with [facility] Clinical Social Work Department's list of facilities offering this level of care within the geographic area requested by the patient (or if unable, by the patient's family).  Yes   Patient/family informed of their freedom to choose among providers that offer the needed level of care, that participate in Medicare, Medicaid or managed care program needed by the patient, have an available bed and are willing to accept the patient.  Yes   Patient/family informed of [place]'s ownership interest in [facility] and [facility], as well as of the fact that they are under no obligation to receive care at these facilities.  PASRR submitted to EDS on       PASRR number received on       Existing PASRR number confirmed on 03/11/18     FL2 transmitted to all facilities in geographic area requested by pt/family on 03/11/18     FL2 transmitted to all facilities within larger geographic area on       Patient informed that his/her managed care company has contracts with or will negotiate with certain facilities, including the following:        Yes   Patient/family informed of bed offers received.  Patient chooses bed at [facility] )     Physician recommends and patient chooses bed at [facility]    Patient to be transferred to ([facility] ) on 03/15/18.  Patient to be transferred to facility by (Patient's daughter in law [place] )     Patient family notified on 03/15/18 of transfer.  Name of family member notified:  (Daughter in law Marcie [phone])     PHYSICIAN       Additional  Comment:    _______________________________________________ Ruthe Mannan, LCSWA 03/15/2018, 10:38 AM

## 2018-03-15 NOTE — Discharge Summary (Signed)
Marilyn Andrews, is a 82 y.o. female  DOB [birth year]  MRN [id number].  Admission date:  03/07/2018  Admitting Physician  Milagros Loll, MD  Discharge Date:  03/15/2018   Primary MD  Center, [facility]  Recommendations for primary care physician for things to follow:   Follow-up with PCP at [facility] clinic in 1 week   Admission Diagnosis  Sigmoid diverticulitis [K57.32]   Discharge Diagnosis  Sigmoid diverticulitis [K57.32]   Principal Problem:   Severe recurrent major depression without psychotic features [facility]) Active Problems:   Acute diverticulitis      Past Medical History:  Diagnosis Date  . Anxiety   . Arthritis   . Diverticulitis   . Hypertension     Past Surgical History:  Procedure Laterality Date  . ABDOMINAL HYSTERECTOMY    . APPENDECTOMY    . CHOLECYSTECTOMY    . HYSTERECTOMY ABDOMINAL WITH SALPINGECTOMY         History of present illness and  Hospital Course:     Kindly see H&P for history of present illness and admission details, please review complete Labs, Consult reports and Test reports for all details in brief  HPI  from the history and physical done on the day of admission  82 year old female patient with essential hypertension, prior diverticulitis admitted for lower abdominal pain, nausea and found to have acute diverticulitis of descending and sigmoid colon admitted for the same.  Patient admitted on May 6.  Hospital Course  #1 acute abdominal pain secondary to acute diverticulitis of descending and sigmoid colon diverticula.  Patient received IV antibiotics with Cipro, Flagyl, IV fluids, patient was kept n.p.o. because of abdominal pain.  Patient WBC 13.1 and when she came but it is normalized to 5.3 next day.  However patient continued to have nausea, vomiting and slight  abdominal pain.  We continued IV fluids, IV antibiotics.  Because of nausea we stopped Flagyl and started on Zosyn.  Patient has no abdominal pain now.  Patient received multiple doses of Zofran, Phenergan, Reglan for nausea.  Finally her nausea improved and she is tolerating the diet now.  Patient also already received 9 days of IV antibiotics.  She does not need any more antibiotics at discharge. 2.  Acute gastritis with nausea, heartburn: Patient received IV PPIs, also added Carafate.  Patient nausea improved after adding Carafate also.  She will plan to continue Carafate at discharge, patient also should continue ranitidine 150 mg daily. 3.  Severe recurrent major depression without psychotic features.  Patient probably lost her roommate that she is living with 50 years recently because of that she had multiple symptoms of lack of energy and lack of sleep but did not have any suicidal thoughts.  Seen by psychiatrist Dr. Toni Amend, restarted her Lexapro, added Wellbutrin 100 mg a day.  We provided support.  Patient seen by a hospital chaplain for  emotional support.  For her. 4.  Deconditioning: Physical therapy recommended rehab placement and patient is going to [facility] today. Essential hypertension: Controlled. Discussed with patient's daughter  Discharge Condition: Stable   Follow UP   Contact information for follow-up providers    Center, [facility]. Schedule an appointment as soon as possible for a visit in 1 week(s).   Specialty:  General Practice Contact information: [address] [place] [address] [phone]            Contact information for after-discharge care    Destination  [facility] .   Service:  Skilled Nursing Contact information: [address] [place] [address] [phone]                    Discharge Instructions  and  Discharge Medications      Allergies as of 03/15/2018       Reactions   Penicillins Other (See Comments)   Reaction as a child, patient does not recall Has patient had a PCN reaction causing immediate rash, facial/tongue/throat swelling, SOB or lightheadedness with hypotension: unknown Has patient had a PCN reaction causing severe rash involving mucus membranes or skin necrosis: unknown Has patient had a PCN reaction that required hospitalization: unknown Has patient had a PCN reaction occurring within the last 10 years: No If all of the above answers are "NO", then may proceed with Cephalosporin use.   Sulfa Antibiotics       Medication List    STOP taking these medications   diazepam 5 MG tablet Commonly known as:  VALIUM   etodolac 200 MG capsule Commonly known as:  LODINE   HYDROcodone-acetaminophen 5-325 MG tablet Commonly known as:  NORCO/VICODIN   ibuprofen 600 MG tablet Commonly known as:  ADVIL,MOTRIN   lidocaine 5 % Commonly known as:  LIDODERM   oxyCODONE 5 MG immediate release tablet Commonly known as:  Oxy IR/ROXICODONE   ranitidine 150 MG tablet Commonly known as:  ZANTAC   traMADol 50 MG tablet Commonly known as:  ULTRAM     TAKE these medications   amLODipine 5 MG tablet Commonly known as:  NORVASC Take 5 mg by mouth daily.   buPROPion 150 MG 12 hr tablet Commonly known as:  WELLBUTRIN SR Take 1 tablet (150 mg total) by mouth 2 (two) times daily.   escitalopram 20 MG tablet Commonly known as:  LEXAPRO Take 1 tablet (20 mg total) by mouth daily.   fluticasone 50 MCG/ACT nasal spray Commonly known as:  FLONASE Place 2 sprays into both nostrils 2 (two) times daily as needed for allergies or rhinitis.   ondansetron 4 MG disintegrating tablet Commonly known as:  ZOFRAN ODT Take 1 tablet (4 mg total) by mouth every 8 (eight) hours as needed for nausea or vomiting.   sucralfate 1 GM/10ML suspension Commonly known as:  CARAFATE Take 10 mLs (1 g total) by mouth 4 (four) times daily -  with meals and at  bedtime.   triamcinolone cream 0.1 % Commonly known as:  KENALOG Apply 1 application topically daily as needed. Rash on hands         Diet and Activity recommendation: See Discharge Instructions above   Consults obtained -psychiatric, physical therapy   Major procedures and Radiology Reports - PLEASE review detailed and final reports for all details, in brief -      Ct Abdomen Pelvis W Contrast  Result Date: 03/07/2018 CLINICAL DATA:  Left-sided abdominal pain. Nausea. Set increase in symptoms this morning. EXAM: CT ABDOMEN AND PELVIS WITH CONTRAST TECHNIQUE: Multidetector CT imaging of the abdomen and pelvis was performed using the standard protocol following bolus administration of intravenous contrast. CONTRAST:  75mL ISOVUE-370 IOPAMIDOL (ISOVUE-370) INJECTION 76% COMPARISON:  CT abdomen and pelvis 04/21/2017 FINDINGS: Lower chest: Mild dependent atelectasis and scarring is again noted. The heart size is normal. No other focal nodule or mass lesion is present. No significant pleural or pericardial effusion is present. Hepatobiliary: Intra and extrahepatic biliary dilation has progressed since the prior exam. No obstructing mass  lesion is present. The common bile duct now measures 0.6 cm compared with 1.1 cm on the prior exam. No focal hepatic mass lesions are present. Pancreas: The common bile duct is dilated within the pancreatic head. Pancreatic duct is not dilated. No focal mass lesion is present. There is no significant cystic disease. Spleen: Choose 1 Adrenals/Urinary Tract: Adrenal glands are normal bilaterally. Kidneys and ureters are within normal limits. The urinary bladder is unremarkable. Stomach/Bowel: The stomach and duodenum are within normal limits. The small bowel is unremarkable. Terminal ileum is within normal limits. Appendectomy is noted. Ascending and transverse colon are within normal limits. Diverticular changes are present in the descending colon. There is mild  inflammatory change in the distal descending colon compatible with acute diverticulitis. Additional diverticular changes are present throughout the sigmoid colon without other areas of focal inflammation. There is no evidence for bowel perforation or abscess. Vascular/Lymphatic: Atherosclerotic calcifications are present in the aorta without aneurysm or stenosis. No significant adenopathy is present. Reproductive: Status post hysterectomy. No adnexal masses. Other: No abdominal wall hernia or abnormality. No abdominopelvic ascites. Musculoskeletal: Vertebral body heights are maintained. Endplate changes are present at L4-5 and L5-S1. Moderate facet hypertrophy is noted bilaterally. Bilateral foraminal narrowing is present at L4-5 and L5-S1. IMPRESSION: 1. Descending and sigmoid diverticulitis without evidence for bowel perforation or abscess. 2. Additional diverticular changes throughout the sigmoid colon without other areas of focal inflammation. 3.  Aortic Atherosclerosis (ICD10-I70.0). 4. Increasing intra and have extrahepatic biliary dilation. There is a mild increase and alkaline phosphatase, still within normal range. No obstructing mass lesion is present. Electronically Signed   By: Marin Roberts M.D.   On: 03/07/2018 13:42    Micro Results     No results found for this or any previous visit (from the past 240 hour(s)).     Today   Subjective:   Lakely Labarge today stable for discharge to [facility].  Objective:   Blood pressure 139/70, pulse 64, temperature (!) 97.5 F (36.4 C), temperature source Oral, resp. rate 17, height 5' (1.524 m), weight 74.8 kg (165 lb), SpO2 92 %.   Intake/Output Summary (Last 24 hours) at 03/15/2018 0910 Last data filed at 03/15/2018 0340 Gross per 24 hour  Intake 480 ml  Output 3500 ml  Net -3020 ml    Exam Awake Alert, Oriented x 3, No new F.N deficits, Normal affect [place].AT,PERRAL Supple Neck,No JVD, No cervical lymphadenopathy  appriciated.  Symmetrical Chest wall movement, Good air movement bilaterally, CTAB RRR,No Gallops,Rubs or new Murmurs, No Parasternal Heave +ve B.Sounds, Abd Soft, Non tender, No organomegaly appriciated, No rebound -guarding or rigidity. No Cyanosis, Clubbing or edema, No new Rash or bruise  Data Review   CBC w Diff:  Lab Results  Component Value Date   WBC 5.3 03/12/2018   HGB 13.5 03/12/2018   HGB 13.2 03/28/2013   HCT 40.0 03/12/2018   HCT 38.8 03/28/2013   PLT 313 03/12/2018   PLT 269 03/28/2013   LYMPHOPCT 6 03/07/2018   LYMPHOPCT 14.7 03/28/2013   MONOPCT 5 03/07/2018   MONOPCT 10.1 03/28/2013   EOSPCT 0 03/07/2018   EOSPCT 0.5 03/28/2013   BASOPCT 0 03/07/2018   BASOPCT 0.4 03/28/2013    CMP:  Lab Results  Component Value Date   NA 137 03/12/2018   NA 143 03/31/2013   K 3.2 (L) 03/12/2018   K 3.7 03/31/2013   CL 106 03/12/2018   CL 109 (H) 03/31/2013   CO2 26 03/12/2018  CO2 28 03/31/2013   BUN <5 (L) 03/12/2018   BUN 4 (L) 03/31/2013   CREATININE 0.80 03/14/2018   CREATININE 0.95 03/31/2013   PROT 7.3 03/07/2018   PROT 7.3 03/27/2013   ALBUMIN 3.6 03/07/2018   ALBUMIN 3.3 (L) 03/27/2013   BILITOT 0.8 03/07/2018   BILITOT 0.6 03/27/2013   ALKPHOS 91 03/07/2018   ALKPHOS 97 03/27/2013   AST 21 03/07/2018   AST 26 03/27/2013   ALT 9 (L) 03/07/2018   ALT 18 03/27/2013  .   Total Time in preparing paper work, data evaluation and todays exam - 35 minutes  Katha Hamming M.D on 03/15/2018 at 9:10 AM    Note: This dictation was prepared with Dragon dictation along with smaller phrase technology. Any transcriptional errors that result from this process are unintentional.

## 2018-03-15 NOTE — Progress Notes (Signed)
Called report to [place], Charity fundraiser. [facility]. No questions at close of report. Pt is to transfer to facility with daughter in law [place]. Provided discharge pack to be given to RN at [facility] upon arrival with facility report. DNR, prescription, discharge information/ instruction.

## 2018-03-15 NOTE — Clinical Social Work Note (Signed)
Patient is medically ready for discharge today. CSW notified patient and daughter in law Elease Hashimoto [phone]. CSW also notified Jomarie Longs liaison at [employer] Jomarie Longs states patient can come today. CSW sent discharge summary to [facility]. [facility] will be transported by family to [facility]. RN to call report.   Ruthe Mannan MSW, LCSWA  [phone]

## 2018-03-19 ENCOUNTER — Other Ambulatory Visit
Admission: RE | Admit: 2018-03-19 | Discharge: 2018-03-19 | Disposition: A | Discharge status: Home / Self Care | Payer: No Typology Code available for payment source | Source: Ambulatory Visit | Attending: Family Medicine | Admitting: Family Medicine

## 2018-03-19 DIAGNOSIS — R110 Nausea: Secondary | ICD-10-CM | POA: Diagnosis present

## 2018-03-19 LAB — URINALYSIS, COMPLETE (UACMP) WITH MICROSCOPIC
Bacteria, UA: NONE SEEN
Bilirubin Urine: NEGATIVE
Glucose, UA: NEGATIVE mg/dL
Hgb urine dipstick: NEGATIVE
Ketones, ur: NEGATIVE mg/dL
Leukocytes, UA: NEGATIVE
Nitrite: NEGATIVE
Protein, ur: NEGATIVE mg/dL
Specific Gravity, Urine: 1.009 (ref 1.005–1.030)
pH: 7 (ref 5.0–8.0)

## 2018-03-20 ENCOUNTER — Other Ambulatory Visit
Admission: RE | Admit: 2018-03-20 | Discharge: 2018-03-20 | Disposition: A | Discharge status: Home / Self Care | Payer: Medicare Other | Source: Ambulatory Visit | Attending: Family Medicine | Admitting: Family Medicine

## 2018-03-20 DIAGNOSIS — R197 Diarrhea, unspecified: Secondary | ICD-10-CM | POA: Insufficient documentation

## 2018-03-20 LAB — C DIFFICILE QUICK SCREEN W PCR REFLEX
C Diff antigen: POSITIVE — AB
C Diff toxin: NEGATIVE

## 2018-03-20 LAB — CLOSTRIDIUM DIFFICILE BY PCR, REFLEXED: Toxigenic C. Difficile by PCR: POSITIVE — AB

## 2018-03-21 LAB — URINE CULTURE: Culture: NO GROWTH

## 2018-07-28 ENCOUNTER — Encounter: Payer: Self-pay | Admitting: Emergency Medicine

## 2018-07-28 ENCOUNTER — Emergency Department: Payer: Medicare Other

## 2018-07-28 ENCOUNTER — Other Ambulatory Visit: Payer: Self-pay

## 2018-07-28 ENCOUNTER — Observation Stay
Admission: EM | Admit: 2018-07-28 | Discharge: 2018-07-29 | Disposition: A | Discharge status: Home Health | Payer: Medicare Other | Attending: Internal Medicine | Admitting: Internal Medicine

## 2018-07-28 DIAGNOSIS — R0602 Shortness of breath: Secondary | ICD-10-CM

## 2018-07-28 DIAGNOSIS — Z882 Allergy status to sulfonamides status: Secondary | ICD-10-CM | POA: Insufficient documentation

## 2018-07-28 DIAGNOSIS — I10 Essential (primary) hypertension: Secondary | ICD-10-CM | POA: Diagnosis not present

## 2018-07-28 DIAGNOSIS — F419 Anxiety disorder, unspecified: Secondary | ICD-10-CM | POA: Insufficient documentation

## 2018-07-28 DIAGNOSIS — Z79899 Other long term (current) drug therapy: Secondary | ICD-10-CM | POA: Diagnosis not present

## 2018-07-28 DIAGNOSIS — E876 Hypokalemia: Secondary | ICD-10-CM | POA: Diagnosis present

## 2018-07-28 DIAGNOSIS — R1030 Lower abdominal pain, unspecified: Secondary | ICD-10-CM | POA: Diagnosis present

## 2018-07-28 DIAGNOSIS — R531 Weakness: Secondary | ICD-10-CM

## 2018-07-28 DIAGNOSIS — E860 Dehydration: Secondary | ICD-10-CM | POA: Diagnosis not present

## 2018-07-28 DIAGNOSIS — K5730 Diverticulosis of large intestine without perforation or abscess without bleeding: Secondary | ICD-10-CM | POA: Diagnosis not present

## 2018-07-28 DIAGNOSIS — I700 Atherosclerosis of aorta: Secondary | ICD-10-CM | POA: Diagnosis not present

## 2018-07-28 DIAGNOSIS — R296 Repeated falls: Secondary | ICD-10-CM | POA: Insufficient documentation

## 2018-07-28 DIAGNOSIS — N390 Urinary tract infection, site not specified: Principal | ICD-10-CM

## 2018-07-28 DIAGNOSIS — Z880 Allergy status to penicillin: Secondary | ICD-10-CM | POA: Diagnosis not present

## 2018-07-28 DIAGNOSIS — Z66 Do not resuscitate: Secondary | ICD-10-CM | POA: Insufficient documentation

## 2018-07-28 DIAGNOSIS — R319 Hematuria, unspecified: Secondary | ICD-10-CM

## 2018-07-28 LAB — COMPREHENSIVE METABOLIC PANEL
ALT: 16 U/L (ref 0–44)
AST: 24 U/L (ref 15–41)
Albumin: 3.5 g/dL (ref 3.5–5.0)
Alkaline Phosphatase: 87 U/L (ref 38–126)
Anion gap: 9 (ref 5–15)
BUN: 10 mg/dL (ref 8–23)
CO2: 26 mmol/L (ref 22–32)
Calcium: 8.4 mg/dL — ABNORMAL LOW (ref 8.9–10.3)
Chloride: 103 mmol/L (ref 98–111)
Creatinine, Ser: 1.04 mg/dL — ABNORMAL HIGH (ref 0.44–1.00)
GFR calc Af Amer: 57 mL/min — ABNORMAL LOW (ref >60)
GFR calc non Af Amer: 49 mL/min — ABNORMAL LOW (ref >60)
Glucose, Bld: 115 mg/dL — ABNORMAL HIGH (ref 70–99)
Potassium: 3.1 mmol/L — ABNORMAL LOW (ref 3.5–5.1)
Sodium: 138 mmol/L (ref 135–145)
Total Bilirubin: 1 mg/dL (ref 0.3–1.2)
Total Protein: 7.3 g/dL (ref 6.5–8.1)

## 2018-07-28 LAB — URINALYSIS, COMPLETE (UACMP) WITH MICROSCOPIC
Bilirubin Urine: NEGATIVE
Glucose, UA: NEGATIVE mg/dL
Ketones, ur: NEGATIVE mg/dL
Nitrite: POSITIVE — AB
Protein, ur: 30 mg/dL — AB
Specific Gravity, Urine: 1.017 (ref 1.005–1.030)
WBC, UA: >50 WBC/hpf — ABNORMAL HIGH (ref 0–5)
pH: 6 (ref 5.0–8.0)

## 2018-07-28 LAB — CBC
HCT: 41 % (ref 35.0–47.0)
Hemoglobin: 14 g/dL (ref 12.0–16.0)
MCH: 29.4 pg (ref 26.0–34.0)
MCHC: 34.1 g/dL (ref 32.0–36.0)
MCV: 86.2 fL (ref 80.0–100.0)
Platelets: 311 10*3/uL (ref 150–440)
RBC: 4.75 MIL/uL (ref 3.80–5.20)
RDW: 14.8 % — ABNORMAL HIGH (ref 11.5–14.5)
WBC: 10.1 10*3/uL (ref 3.6–11.0)

## 2018-07-28 LAB — LIPASE, BLOOD: Lipase: 22 U/L (ref 11–51)

## 2018-07-28 LAB — LACTIC ACID, PLASMA: Lactic Acid, Venous: 1.1 mmol/L (ref 0.5–1.9)

## 2018-07-28 LAB — TROPONIN I: Troponin I: <0.03 ng/mL (ref <0.03)

## 2018-07-28 MED ORDER — ENOXAPARIN SODIUM 40 MG/0.4ML ~~LOC~~ SOLN
40.0000 mg | SUBCUTANEOUS | Status: DC
  Administered 2018-07-28: 40 mg via SUBCUTANEOUS
  Filled 2018-07-28: qty 0.4

## 2018-07-28 MED ORDER — POTASSIUM CHLORIDE CRYS ER 20 MEQ PO TBCR
40.0000 meq | EXTENDED_RELEASE_TABLET | Freq: Once | ORAL | Status: AC
  Administered 2018-07-28: 40 meq via ORAL
  Filled 2018-07-28: qty 2

## 2018-07-28 MED ORDER — PROMETHAZINE HCL 25 MG/ML IJ SOLN
INTRAMUSCULAR | Status: AC
  Administered 2018-07-28: 12.5 mg via INTRAVENOUS
  Filled 2018-07-28: qty 1

## 2018-07-28 MED ORDER — MORPHINE SULFATE (PF) 2 MG/ML IV SOLN
2.0000 mg | Freq: Once | INTRAVENOUS | Status: AC
  Administered 2018-07-28: 2 mg via INTRAVENOUS
  Filled 2018-07-28: qty 1

## 2018-07-28 MED ORDER — ONDANSETRON HCL 4 MG/2ML IJ SOLN
INTRAMUSCULAR | Status: AC
  Filled 2018-07-28: qty 2

## 2018-07-28 MED ORDER — SODIUM CHLORIDE 0.9 % IV SOLN: 1.0000 g | INTRAVENOUS | Status: DC

## 2018-07-28 MED ORDER — AMLODIPINE BESYLATE 5 MG PO TABS
5.0000 mg | ORAL_TABLET | Freq: Every day | ORAL | Status: DC
  Administered 2018-07-28 – 2018-07-29 (×2): 5 mg via ORAL
  Filled 2018-07-28 (×2): qty 1

## 2018-07-28 MED ORDER — SODIUM CHLORIDE 0.9 % IV SOLN
1.0000 g | Freq: Once | INTRAVENOUS | Status: AC
  Administered 2018-07-28: 1 g via INTRAVENOUS
  Filled 2018-07-28: qty 10

## 2018-07-28 MED ORDER — IOPAMIDOL (ISOVUE-300) INJECTION 61%
100.0000 mL | Freq: Once | INTRAVENOUS | Status: AC | PRN
  Administered 2018-07-28: 100 mL via INTRAVENOUS

## 2018-07-28 MED ORDER — SENNOSIDES-DOCUSATE SODIUM 8.6-50 MG PO TABS: 1.0000 | ORAL_TABLET | Freq: Every evening | ORAL | Status: DC | PRN

## 2018-07-28 MED ORDER — ACETAMINOPHEN 650 MG RE SUPP: 650.0000 mg | Freq: Four times a day (QID) | RECTAL | Status: DC | PRN

## 2018-07-28 MED ORDER — ACETAMINOPHEN 325 MG PO TABS
650.0000 mg | ORAL_TABLET | Freq: Four times a day (QID) | ORAL | Status: DC | PRN
  Administered 2018-07-28: 650 mg via ORAL
  Filled 2018-07-28: qty 2

## 2018-07-28 MED ORDER — SODIUM CHLORIDE 0.9 % IV SOLN
1.0000 g | INTRAVENOUS | Status: DC
  Administered 2018-07-29: 1 g via INTRAVENOUS
  Filled 2018-07-28: qty 1
  Filled 2018-07-28: qty 10

## 2018-07-28 MED ORDER — PROMETHAZINE HCL 25 MG/ML IJ SOLN
12.5000 mg | Freq: Once | INTRAMUSCULAR | Status: AC
  Administered 2018-07-28: 12.5 mg via INTRAVENOUS

## 2018-07-28 MED ORDER — IOPAMIDOL (ISOVUE-300) INJECTION 61%
30.0000 mL | Freq: Once | INTRAVENOUS | Status: AC | PRN
  Administered 2018-07-28: 30 mL via ORAL

## 2018-07-28 MED ORDER — SODIUM CHLORIDE 0.9 % IV BOLUS
500.0000 mL | Freq: Once | INTRAVENOUS | Status: AC
  Administered 2018-07-28: 500 mL via INTRAVENOUS

## 2018-07-28 MED ORDER — BUPROPION HCL ER (SR) 150 MG PO TB12
150.0000 mg | ORAL_TABLET | Freq: Two times a day (BID) | ORAL | Status: DC
  Administered 2018-07-28 – 2018-07-29 (×2): 150 mg via ORAL
  Filled 2018-07-28 (×3): qty 1

## 2018-07-28 MED ORDER — ONDANSETRON 4 MG PO TBDP: 4.0000 mg | ORAL_TABLET | Freq: Three times a day (TID) | ORAL | Status: DC | PRN

## 2018-07-28 MED ORDER — ONDANSETRON HCL 4 MG/2ML IJ SOLN
4.0000 mg | Freq: Once | INTRAMUSCULAR | Status: AC
  Administered 2018-07-28: 4 mg via INTRAVENOUS

## 2018-07-28 MED ORDER — METOCLOPRAMIDE HCL 5 MG/ML IJ SOLN
10.0000 mg | Freq: Once | INTRAMUSCULAR | Status: AC
  Administered 2018-07-28: 10 mg via INTRAVENOUS
  Filled 2018-07-28: qty 2

## 2018-07-28 MED ORDER — ESCITALOPRAM OXALATE 10 MG PO TABS
20.0000 mg | ORAL_TABLET | Freq: Every day | ORAL | Status: DC
  Administered 2018-07-28 – 2018-07-29 (×2): 20 mg via ORAL
  Filled 2018-07-28 (×2): qty 2

## 2018-07-28 MED ORDER — FLUTICASONE PROPIONATE 50 MCG/ACT NA SUSP
2.0000 | Freq: Two times a day (BID) | NASAL | Status: DC | PRN
  Filled 2018-07-28: qty 16

## 2018-07-28 MED ORDER — FAMOTIDINE 20 MG PO TABS
20.0000 mg | ORAL_TABLET | Freq: Two times a day (BID) | ORAL | Status: DC
  Administered 2018-07-28 – 2018-07-29 (×2): 20 mg via ORAL
  Filled 2018-07-28 (×2): qty 1

## 2018-07-28 MED ORDER — SUCRALFATE 1 GM/10ML PO SUSP
1.0000 g | Freq: Three times a day (TID) | ORAL | Status: DC
  Administered 2018-07-28 – 2018-07-29 (×3): 1 g via ORAL
  Filled 2018-07-28 (×3): qty 10

## 2018-07-28 MED ORDER — SODIUM CHLORIDE 0.9 % IV SOLN
INTRAVENOUS | Status: DC
  Administered 2018-07-28 – 2018-07-29 (×2): via INTRAVENOUS

## 2018-07-28 MED ORDER — TRIAMCINOLONE ACETONIDE 0.1 % EX CREA
1.0000 "application " | TOPICAL_CREAM | Freq: Two times a day (BID) | CUTANEOUS | Status: DC
  Administered 2018-07-28 – 2018-07-29 (×2): 1 via TOPICAL
  Filled 2018-07-28: qty 15

## 2018-07-28 NOTE — Progress Notes (Signed)
Advanced care plan.  Purpose of the Encounter: CODE STATUS  Parties in Attendance:Patient and family  Patient's Decision Capacity:Good  Subjective/Patient's story: Presented to the emergency room for abdominal discomfort, nausea and weakness   Objective/Medical story Has urinary tract infection and dehydration Needs IV antibiotics and fluids   Goals of care determination:  Advance care directives and goals of care discussed Patient and family do not want any CPR, intubation and ventilator if the need arises   CODE STATUS: DNR   Time spent discussing advanced care planning: 16 minutes

## 2018-07-28 NOTE — H&P (Signed)
[facility] [place] at [facility]   PATIENT NAME: Marilyn Andrews    MR#:  [id number]  DATE OF BIRTH:  1936-03-25  DATE OF ADMISSION:  07/28/2018  PRIMARY CARE PHYSICIAN: [facility], [facility]   REQUESTING/REFERRING PHYSICIAN:   CHIEF COMPLAINT:   Chief Complaint  Patient presents with  . Abdominal Pain  . Emesis    HISTORY OF PRESENT ILLNESS: Marilyn Andrews  is a 82 y.o. female with a known history of diverticular disease, hypertension, arthritis presented to the emergency room for abdominal discomfort, nausea.  No history of any vomiting blood, rectal bleed.  Has some chills but no fever.  Has dysuria.  She was worked up with CT abdomen which showed diverticular disease but no diverticulitis, no obstruction.  Urinalysis showed infection.  Patient started on IV antibiotics in the emergency room.  PAST MEDICAL HISTORY:   Past Medical History:  Diagnosis Date  . Anxiety   . Arthritis   . Diverticulitis   . Hypertension     PAST SURGICAL HISTORY:  Past Surgical History:  Procedure Laterality Date  . ABDOMINAL HYSTERECTOMY    . APPENDECTOMY    . CHOLECYSTECTOMY    . HYSTERECTOMY ABDOMINAL WITH SALPINGECTOMY      SOCIAL HISTORY:  Social History   Tobacco Use  . Smoking status: Never Smoker  . Smokeless tobacco: Never Used  Substance Use Topics  . Alcohol use: No    FAMILY HISTORY:  Family History  Problem Relation Age of Onset  . Hypertension Father   . CAD Brother     DRUG ALLERGIES:  Allergies  Allergen Reactions  . Penicillins Other (See Comments)    Reaction as a child, patient does not recall  Has patient had a PCN reaction causing immediate rash, facial/tongue/throat swelling, SOB or lightheadedness with hypotension: unknown Has patient had a PCN reaction causing severe rash involving mucus membranes or skin necrosis: unknown Has patient had a PCN reaction that required hospitalization: unknown Has patient had a PCN  reaction occurring within the last 10 years: No If all of the above answers are "NO", then may proceed with Cephalosporin use.  . Sulfa Antibiotics     REVIEW OF SYSTEMS:   CONSTITUTIONAL: No fever,has fatigue and weakness.  EYES: No blurred or double vision.  EARS, NOSE, AND THROAT: No tinnitus or ear pain.  RESPIRATORY: No cough, shortness of breath, wheezing or hemoptysis.  CARDIOVASCULAR: No chest pain, orthopnea, edema.  GASTROINTESTINAL: Has nausea, vomiting,  No diarrhea  mild abdominal pain.  GENITOURINARY: Has dysuria, No  hematuria.  ENDOCRINE: No polyuria, nocturia,  HEMATOLOGY: No anemia, easy bruising or bleeding SKIN: No rash or lesion. MUSCULOSKELETAL: No joint pain or arthritis.   NEUROLOGIC: No tingling, numbness, weakness.  PSYCHIATRY: No anxiety or depression.   MEDICATIONS AT HOME:  Prior to Admission medications   Medication Sig Start Date End Date Taking? Authorizing Provider  acetaminophen (TYLENOL) 500 MG tablet Take 500-1,000 mg by mouth every 6 (six) hours as needed for mild pain or moderate pain.   Yes [provider]  amLODipine (NORVASC) 5 MG tablet Take 5 mg by mouth daily.   Yes [provider]  escitalopram (LEXAPRO) 20 MG tablet Take 1 tablet (20 mg total) by mouth daily. 03/14/18  Yes Katha Hamming, MD  fluticasone (FLONASE) 50 MCG/ACT nasal spray Place 2 sprays into both nostrils 2 (two) times daily as needed for allergies or rhinitis.    Yes [provider]  ranitidine (ZANTAC)  150 MG tablet Take 150 mg by mouth daily.   Yes [provider]  triamcinolone cream (KENALOG) 0.1 % Apply 1 application topically daily as needed. Rash on hands   Yes [provider]  buPROPion (WELLBUTRIN SR) 150 MG 12 hr tablet Take 1 tablet (150 mg total) by mouth 2 (two) times daily. Patient not taking: Reported on 07/28/2018 03/14/18   Katha Hamming, MD  famotidine (PEPCID) 20 MG tablet Take 1 tablet (20 mg  total) by mouth 2 (two) times daily. Patient not taking: Reported on 07/28/2018 03/15/18 03/15/19  Katha Hamming, MD  ondansetron (ZOFRAN ODT) 4 MG disintegrating tablet Take 1 tablet (4 mg total) by mouth every 8 (eight) hours as needed for nausea or vomiting. Patient not taking: Reported on 07/28/2018 03/14/18   Katha Hamming, MD  sucralfate (CARAFATE) 1 GM/10ML suspension Take 10 mLs (1 g total) by mouth 4 (four) times daily -  with meals and at bedtime. Patient not taking: Reported on 07/28/2018 03/14/18   Katha Hamming, MD      PHYSICAL EXAMINATION:   VITAL SIGNS: Blood pressure (!) 115/41, pulse 89, temperature 99.9 F (37.7 C), temperature source Oral, resp. rate 16, height 5' (1.524 m), weight 79.4 kg, SpO2 92 %.  GENERAL:  81 y.o.-year-old patient lying in the bed with no acute distress.  EYES: Pupils equal, round, reactive to light and accommodation. No scleral icterus. Extraocular muscles intact.  HEENT: Head atraumatic, normocephalic. Oropharynx dry and nasopharynx clear.  NECK:  Supple, no jugular venous distention. No thyroid enlargement, no tenderness.  LUNGS: Normal breath sounds bilaterally, no wheezing, rales,rhonchi or crepitation. No use of accessory muscles of respiration.  CARDIOVASCULAR: S1, S2 normal. No murmurs, rubs, or gallops.  ABDOMEN: Soft, mild tenderness  Around umbilicus, nondistended. Bowel sounds present. No organomegaly or mass.  EXTREMITIES: No pedal edema, cyanosis, or clubbing.  NEUROLOGIC: Cranial nerves II through XII are intact. Muscle strength 5/5 in all extremities. Sensation intact. Gait not checked.  PSYCHIATRIC: The patient is alert and oriented x 3.  SKIN: No obvious rash, lesion, or ulcer.   LABORATORY PANEL:   CBC Recent Labs  Lab 07/28/18 0739  WBC 10.1  HGB 14.0  HCT 41.0  PLT 311  MCV 86.2  MCH 29.4  MCHC 34.1  RDW 14.8*    ------------------------------------------------------------------------------------------------------------------  Chemistries  Recent Labs  Lab 07/28/18 0739  NA 138  K 3.1*  CL 103  CO2 26  GLUCOSE 115*  BUN 10  CREATININE 1.04*  CALCIUM 8.4*  AST 24  ALT 16  ALKPHOS 87  BILITOT 1.0   ------------------------------------------------------------------------------------------------------------------ estimated creatinine clearance is 40.3 mL/min (A) (by C-G formula based on SCr of 1.04 mg/dL (H)). ------------------------------------------------------------------------------------------------------------------ No results for input(s): TSH, T4TOTAL, T3FREE, THYROIDAB in the last 72 hours.  Invalid input(s): FREET3   Coagulation profile No results for input(s): INR, PROTIME in the last 168 hours. ------------------------------------------------------------------------------------------------------------------- No results for input(s): DDIMER in the last 72 hours. -------------------------------------------------------------------------------------------------------------------  Cardiac Enzymes Recent Labs  Lab 07/28/18 0739  TROPONINI <0.03   ------------------------------------------------------------------------------------------------------------------ Invalid input(s): POCBNP  ---------------------------------------------------------------------------------------------------------------  Urinalysis    Component Value Date/Time   COLORURINE AMBER (A) 07/28/2018 0739   APPEARANCEUR CLEAR (A) 07/28/2018 0739   APPEARANCEUR Hazy 03/27/2013 1114   LABSPEC 1.017 07/28/2018 0739   LABSPEC 1.026 03/27/2013 1114   PHURINE 6.0 07/28/2018 0739   GLUCOSEU NEGATIVE 07/28/2018 0739   GLUCOSEU Negative 03/27/2013 1114   HGBUR MODERATE (A) 07/28/2018 0739   BILIRUBINUR NEGATIVE 07/28/2018 0739  BILIRUBINUR Negative 03/27/2013 1114   KETONESUR NEGATIVE 07/28/2018 0739    PROTEINUR 30 (A) 07/28/2018 0739   NITRITE POSITIVE (A) 07/28/2018 0739   LEUKOCYTESUR MODERATE (A) 07/28/2018 0739   LEUKOCYTESUR Negative 03/27/2013 1114     RADIOLOGY: Ct Abdomen Pelvis W Contrast  Result Date: 07/28/2018 CLINICAL DATA:  82 year old female with pain in the lower abdomen and emesis for the past several days. EXAM: CT ABDOMEN AND PELVIS WITH CONTRAST TECHNIQUE: Multidetector CT imaging of the abdomen and pelvis was performed using the standard protocol following bolus administration of intravenous contrast. CONTRAST:  ISOVUE-300 IOPAMIDOL (ISOVUE-300) INJECTION 61% COMPARISON:  CT the abdomen and pelvis 03/07/2018 FINDINGS: Lower chest: Areas of mild scarring in the lower lobes of the lungs bilaterally. Hepatobiliary: Subcentimeter low-attenuation lesions in segment 6 of the liver, too small to characterize, but similar to the prior study, statistically likely to represent tiny cysts. No other larger more suspicious appearing cystic or solid hepatic lesions. Status post cholecystectomy. Mild intrahepatic and moderate extrahepatic biliary ductal dilatation, similar to the prior study, likely reflective of post cholecystectomy physiology. No calcified stone in the common bile duct. Pancreas: No pancreatic mass. No pancreatic ductal dilatation. No pancreatic or peripancreatic fluid or inflammatory changes. Spleen: Unremarkable. Adrenals/Urinary Tract: Subcentimeter low-attenuation lesion in the posterior aspect of the interpolar region of the left kidney, too small to characterize, but similar to the prior study and statistically likely to represent a tiny cyst. Right kidney and bilateral adrenal glands are normal in appearance. No hydroureteronephrosis. Urinary bladder is normal in appearance. Stomach/Bowel: Normal appearance of the stomach. No pathologic dilatation of small bowel or colon. Numerous colonic diverticulae are noted, without surrounding inflammatory changes to suggest  an acute diverticulitis at this time. The appendix is not confidently identified and may be surgically absent. Regardless, there are no inflammatory changes noted adjacent to the cecum to suggest the presence of an acute appendicitis at this time. Vascular/Lymphatic: Aortic atherosclerosis, without evidence of aneurysm or dissection in the abdominal or pelvic vasculature. No lymphadenopathy noted in the abdomen or pelvis. Reproductive: Status post hysterectomy. Ovaries are unremarkable in appearance. Other: No significant volume of ascites.  No pneumoperitoneum. Musculoskeletal: There are no aggressive appearing lytic or blastic lesions noted in the visualized portions of the skeleton. IMPRESSION: 1. No acute findings are noted in the abdomen or pelvis to account for the patient's symptoms. 2. Persistent intra and extrahepatic biliary ductal dilatation, very similar to the prior study, presumably reflective of benign post cholecystectomy physiology. No calcified choledocholithiasis. If there is any biochemical evidence or other clinical concern for biliary tract obstruction, this could be further evaluated with MRI of the abdomen with and without IV gadolinium with MRCP. 3. Colonic diverticulosis without evidence of acute diverticulitis at this time. 4. Aortic atherosclerosis. Electronically Signed   By: Trudie Reed M.D.   On: 07/28/2018 10:43    EKG: Orders placed or performed during the hospital encounter of 07/28/18  . ED EKG  . ED EKG  . EKG 12-Lead  . EKG 12-Lead    IMPRESSION AND PLAN:  82 year old elderly female patient with history of diverticular disease, arthritis, hypertension presented to the emergency room with nausea, dysuria and abdominal discomfort  -Urinary tract infection Follow-up urine culture IV Rocephin antibiotic  -Dehydration Hydration with normal saline Sinus  -Acute hypokalemia Replace potassium orally  -DVT prophylaxis subcu Lovenox daily  All the records  are reviewed and case discussed with ED provider. Management plans discussed with the patient, family  and they are in agreement.  CODE STATUS: DNR Code Status History    Date Active Date Inactive Code Status Order ID Comments User Context   03/07/2018 1506 03/15/2018 1822 DNR [id number]  Milagros Loll, MD ED   03/07/2018 1421 03/07/2018 1506 Full Code [id number]  Milagros Loll, MD ED   07/01/2016 0550 07/07/2016 1918 Full Code [id number]  Arnaldo Natal, MD Inpatient    Questions for Most Recent Historical Code Status (Order [id number])    Question Answer Comment   In the event of cardiac or respiratory ARREST Do not call a "code blue"    In the event of cardiac or respiratory ARREST Do not perform Intubation, CPR, defibrillation or ACLS    In the event of cardiac or respiratory ARREST Use medication by any route, position, wound care, and other measures to relive pain and suffering. May use oxygen, suction and manual treatment of airway obstruction as needed for comfort.        TOTAL TIME TAKING CARE OF THIS PATIENT: 52 minutes.    Ihor Austin M.D on 07/28/2018 at 1:12 PM  Between 7am to 6pm - Pager - [phone]  After 6pm go to [email or web address] - password [email or web address] [facility]  [place] [facility]  Office  [phone]  CC: Primary care physician; [facility]

## 2018-07-28 NOTE — ED Triage Notes (Signed)
Pt arrived via ems from home with complaints of abdominal pain and emesis for the last few days. Pt states the pain is in her lower abdomen. Pt given 4mg  of zofran prior to arrival.

## 2018-07-28 NOTE — Progress Notes (Signed)
   07/28/18 1345  Clinical Encounter Type  Visited With Patient and family together  Visit Type Initial;Spiritual support;ED  Referral From Nurse  Consult/Referral To Chaplain  Spiritual Encounters  Spiritual Needs Emotional;Other (Comment)   CH received an OR to educate the patient on the AD process. Marilyn Andrews has been in the ED since 0700 this morning and has been in high level of pain. When I reported to the patient's room she had just been give a strong does of pain medication and she was unable to understand why I was visiting. I will follow up when the patient is admitted to a unit room.

## 2018-07-28 NOTE — ED Notes (Signed)
Dr Shaune Pollack notified of 1st neg troponin may cancel

## 2018-07-28 NOTE — ED Notes (Signed)
Up  To bathroom  Voided  approx  500 ml

## 2018-07-28 NOTE — ED Provider Notes (Signed)
[facility] Emergency Department Provider Note ____________________________________________   I have reviewed the triage vital signs and the triage nursing note.  HISTORY  Chief Complaint Abdominal Pain and Emesis   Historian Patient  HPI Marilyn Andrews is a 82 y.o. female with history of prior diverticulitis, states that she has had about a week of waxing and waning nausea and abdominal pain across her lower abdomen.  Denies diarrhea constipation.  Denies any bloody stools.  Denies any bloody emesis.  She has had worsening nausea and abdominal pain overnight.  No fever.     Past Medical History:  Diagnosis Date  . Anxiety   . Arthritis   . Diverticulitis   . Hypertension     Patient Active Problem List   Diagnosis Date Noted  . Severe recurrent major depression without psychotic features (HCC) 03/08/2018  . Acute diverticulitis 03/07/2018  . Diverticulitis 07/01/2016    Past Surgical History:  Procedure Laterality Date  . ABDOMINAL HYSTERECTOMY    . APPENDECTOMY    . CHOLECYSTECTOMY    . HYSTERECTOMY ABDOMINAL WITH SALPINGECTOMY      Prior to Admission medications   Medication Sig Start Date End Date Taking? Authorizing Provider  amLODipine (NORVASC) 5 MG tablet Take 5 mg by mouth daily.    [provider]  buPROPion (WELLBUTRIN SR) 150 MG 12 hr tablet Take 1 tablet (150 mg total) by mouth 2 (two) times daily. 03/14/18   Katha Hamming, MD  escitalopram (LEXAPRO) 20 MG tablet Take 1 tablet (20 mg total) by mouth daily. 03/14/18   Katha Hamming, MD  famotidine (PEPCID) 20 MG tablet Take 1 tablet (20 mg total) by mouth 2 (two) times daily. 03/15/18 03/15/19  Katha Hamming, MD  fluticasone (FLONASE) 50 MCG/ACT nasal spray Place 2 sprays into both nostrils 2 (two) times daily as needed for allergies or rhinitis.     [provider]  ondansetron (ZOFRAN ODT) 4 MG disintegrating tablet Take 1 tablet (4 mg total)  by mouth every 8 (eight) hours as needed for nausea or vomiting. 03/14/18   Katha Hamming, MD  sucralfate (CARAFATE) 1 GM/10ML suspension Take 10 mLs (1 g total) by mouth 4 (four) times daily -  with meals and at bedtime. 03/14/18   Katha Hamming, MD  triamcinolone cream (KENALOG) 0.1 % Apply 1 application topically daily as needed. Rash on hands    [provider]    Allergies  Allergen Reactions  . Penicillins Other (See Comments)    Reaction as a child, patient does not recall  Has patient had a PCN reaction causing immediate rash, facial/tongue/throat swelling, SOB or lightheadedness with hypotension: unknown Has patient had a PCN reaction causing severe rash involving mucus membranes or skin necrosis: unknown Has patient had a PCN reaction that required hospitalization: unknown Has patient had a PCN reaction occurring within the last 10 years: No If all of the above answers are "NO", then may proceed with Cephalosporin use.  . Sulfa Antibiotics     Family History  Problem Relation Age of Onset  . Hypertension Father   . CAD Brother     Social History Social History   Tobacco Use  . Smoking status: Never Smoker  . Smokeless tobacco: Never Used  Substance Use Topics  . Alcohol use: No  . Drug use: No    Review of Systems  Constitutional: Negative for fever. Eyes: Negative for visual changes. ENT: Negative for sore throat. Cardiovascular: Negative for chest pain. Respiratory: Negative  for shortness of breath. Gastrointestinal: Positive for nausea and abdominal pain as per HPI. Genitourinary: Positive for dysuria. Musculoskeletal: Positive for chronic back pain.   Skin: Negative for rash. Neurological: Negative for headache.  ____________________________________________   PHYSICAL EXAM:  VITAL SIGNS: ED Triage Vitals  Enc Vitals Group     BP 07/28/18 0734 (!) 142/60     Pulse Rate 07/28/18 0734 77     Resp 07/28/18 0734 18     Temp  07/28/18 0734 99.9 F (37.7 C)     Temp Source 07/28/18 0734 Oral     SpO2 07/28/18 0734 94 %     Weight 07/28/18 0735 175 lb (79.4 kg)     Height 07/28/18 0735 5' (1.524 m)     Head Circumference --      [facility] Flow --      Pain Score 07/28/18 0735 8     Pain Loc --      Pain Edu? --      Excl. in GC? --      Constitutional: Alert and oriented.  Looks like she does not feel well. HEENT      Head: Normocephalic and atraumatic.      Eyes: Conjunctivae are normal. Pupils equal and round.       Ears:         Nose: No congestion/rhinnorhea.      Mouth/Throat: Mucous membranes are moderately dry.      Neck: No stridor. Cardiovascular/Chest: Normal rate, regular rhythm.  No murmurs, rubs, or gallops. Respiratory: Normal respiratory effort without tachypnea nor retractions. Breath sounds are clear and equal bilaterally. No wheezes/rales/rhonchi. Gastrointestinal: Soft. No distention, guarding or rebound.  She does have diffuse tenderness more so in the lower abdomen without point tenderness. Genitourinary/rectal:Deferred Musculoskeletal: Nontender with normal range of motion in all extremities. No joint effusions.  No lower extremity tenderness.  No edema. Neurologic:  Normal speech and language. No gross or focal neurologic deficits are appreciated. Skin:  Skin is warm, dry and intact. No rash noted. Psychiatric: Mood and affect are normal. Speech and behavior are normal. Patient exhibits appropriate insight and judgment.   ____________________________________________  LABS (pertinent positives/negatives) I, Governor Rooks, MD the attending physician have reviewed the labs noted below.  Labs Reviewed  COMPREHENSIVE METABOLIC PANEL - Abnormal; Notable for the following components:      Result Value   Potassium 3.1 (*)    Glucose, Bld 115 (*)    Creatinine, Ser 1.04 (*)    Calcium 8.4 (*)    GFR calc non Af Amer 49 (*)    GFR calc Af Amer 57 (*)    All other components within  normal limits  CBC - Abnormal; Notable for the following components:   RDW 14.8 (*)    All other components within normal limits  URINALYSIS, COMPLETE (UACMP) WITH MICROSCOPIC - Abnormal; Notable for the following components:   Color, Urine AMBER (*)    APPearance CLEAR (*)    Hgb urine dipstick MODERATE (*)    Protein, ur 30 (*)    Nitrite POSITIVE (*)    Leukocytes, UA MODERATE (*)    WBC, UA >50 (*)    Bacteria, UA MANY (*)    All other components within normal limits  CULTURE, BLOOD (ROUTINE X 2)  CULTURE, BLOOD (ROUTINE X 2)  LIPASE, BLOOD  LACTIC ACID, PLASMA  TROPONIN I  LACTIC ACID, PLASMA    ____________________________________________    EKG I, Governor Rooks, MD, the  attending physician have personally viewed and interpreted all ECGs.  90 beats minute.  Normal sinus rhythm.  Narrow QS P normal axis.  Nonspecific ST and T wave ____________________________________________  RADIOLOGY   Ct abd pel, radiologist report reviewed:    CLINICAL DATA: 82 year old female with pain in the lower abdomen and emesis for the past several days.  EXAM: CT ABDOMEN AND PELVIS WITH CONTRAST  TECHNIQUE: Multidetector CT imaging of the abdomen and pelvis was performed using the standard protocol following bolus administration of intravenous contrast.  CONTRAST: ISOVUE-300 IOPAMIDOL (ISOVUE-300) INJECTION 61%  COMPARISON: CT the abdomen and pelvis 03/07/2018  FINDINGS: Lower chest: Areas of mild scarring in the lower lobes of the lungs bilaterally.  Hepatobiliary: Subcentimeter low-attenuation lesions in segment 6 of the liver, too small to characterize, but similar to the prior study, statistically likely to represent tiny cysts. No other larger more suspicious appearing cystic or solid hepatic lesions. Status post cholecystectomy. Mild intrahepatic and moderate extrahepatic biliary ductal dilatation, similar to the prior study, likely reflective of post  cholecystectomy physiology. No calcified stone in the common bile duct.  Pancreas: No pancreatic mass. No pancreatic ductal dilatation. No pancreatic or peripancreatic fluid or inflammatory changes.  Spleen: Unremarkable.  Adrenals/Urinary Tract: Subcentimeter low-attenuation lesion in the posterior aspect of the interpolar region of the left kidney, too small to characterize, but similar to the prior study and statistically likely to represent a tiny cyst. Right kidney and bilateral adrenal glands are normal in appearance. No hydroureteronephrosis. Urinary bladder is normal in appearance.  Stomach/Bowel: Normal appearance of the stomach. No pathologic dilatation of small bowel or colon. Numerous colonic diverticulae are noted, without surrounding inflammatory changes to suggest an acute diverticulitis at this time. The appendix is not confidently identified and may be surgically absent. Regardless, there are no inflammatory changes noted adjacent to the cecum to suggest the presence of an acute appendicitis at this time.  Vascular/Lymphatic: Aortic atherosclerosis, without evidence of aneurysm or dissection in the abdominal or pelvic vasculature. No lymphadenopathy noted in the abdomen or pelvis.  Reproductive: Status post hysterectomy. Ovaries are unremarkable in appearance.  Other: No significant volume of ascites. No pneumoperitoneum.  Musculoskeletal: There are no aggressive appearing lytic or blastic lesions noted in the visualized portions of the skeleton.  IMPRESSION: 1. No acute findings are noted in the abdomen or pelvis to account for the patient's symptoms. 2. Persistent intra and extrahepatic biliary ductal dilatation, very similar to the prior study, presumably reflective of benign post cholecystectomy physiology. No calcified choledocholithiasis. If there is any biochemical evidence or other clinical concern for biliary tract obstruction, this could be  further evaluated with MRI of the abdomen with and without IV gadolinium with MRCP. 3. Colonic diverticulosis without evidence of acute diverticulitis at this time. 4. Aortic atherosclerosis.      __________________________________________  PROCEDURES  Procedure(s) performed: None  Procedures  Critical Care performed: None   ____________________________________________  ED COURSE / ASSESSMENT AND PLAN  Pertinent labs & imaging results that were available during my care of the patient were reviewed by me and considered in my medical decision making (see chart for details).     Patient arrived looking pale and having retching.  The patient required multiple doses of antiemetics.  She is also complaining of significant belly pain.  Urinalysis consistent with urinary tract infection.  CT scan obtained given concern for any additional intra-abdominal surgical emergency.  On reevaluation around 11 AM, patient is still nauseated, unable to take  p.o.  She lives alone, she is unable to stand due to generalized weakness or take p.o. and is given need hospital admission.     CONSULTATIONS:   Hospitalist for admission   Patient / Family / Caregiver informed of clinical course, medical decision-making process, and agree with plan.     ___________________________________________   FINAL CLINICAL IMPRESSION(S) / ED DIAGNOSES   Final diagnoses:  Urinary tract infection with hematuria, site unspecified  Hypokalemia  Lower abdominal pain  Generalized weakness      ___________________________________________         Note: This dictation was prepared with Dragon dictation. Any transcriptional errors that result from this process are unintentional    Governor Rooks, MD 07/28/18 1120

## 2018-07-29 LAB — BASIC METABOLIC PANEL
Anion gap: 7 (ref 5–15)
BUN: 11 mg/dL (ref 8–23)
CO2: 24 mmol/L (ref 22–32)
Calcium: 7.8 mg/dL — ABNORMAL LOW (ref 8.9–10.3)
Chloride: 110 mmol/L (ref 98–111)
Creatinine, Ser: 0.9 mg/dL (ref 0.44–1.00)
GFR calc Af Amer: >60 mL/min (ref >60)
GFR calc non Af Amer: 59 mL/min — ABNORMAL LOW (ref >60)
Glucose, Bld: 91 mg/dL (ref 70–99)
Potassium: 3.4 mmol/L — ABNORMAL LOW (ref 3.5–5.1)
Sodium: 141 mmol/L (ref 135–145)

## 2018-07-29 LAB — CBC
HCT: 37.1 % (ref 35.0–47.0)
Hemoglobin: 12.6 g/dL (ref 12.0–16.0)
MCH: 29.5 pg (ref 26.0–34.0)
MCHC: 34 g/dL (ref 32.0–36.0)
MCV: 86.8 fL (ref 80.0–100.0)
Platelets: 271 10*3/uL (ref 150–440)
RBC: 4.28 MIL/uL (ref 3.80–5.20)
RDW: 14.7 % — ABNORMAL HIGH (ref 11.5–14.5)
WBC: 6.6 10*3/uL (ref 3.6–11.0)

## 2018-07-29 MED ORDER — CEFUROXIME AXETIL 500 MG PO TABS: 500.0000 mg | ORAL_TABLET | Freq: Two times a day (BID) | ORAL | 0 refills | Status: AC

## 2018-07-29 MED ORDER — ONDANSETRON 4 MG PO TBDP: 4.0000 mg | ORAL_TABLET | Freq: Three times a day (TID) | ORAL | 0 refills | Status: DC | PRN

## 2018-07-29 NOTE — Progress Notes (Signed)
Discharge teaching given to patient, patient verbalized understanding and had no questions. Patient IV removed. Patient will be transported home by family, when they arrive. All patient belongings gathered prior to leaving.

## 2018-07-29 NOTE — Care Management Note (Signed)
Case Management Note  Patient Details  Name: Marilyn Andrews MRN: [id number] Date of Birth: 07/30/1936   Patient admitted with UTI.  Patient lives at home alone.  Daughter in law lives locally for support.  Daughter in law at bedside for assessment . Daughter in law provides transportation to appointments. PCP [facility].  Patient states that her cost for her outpatient medications in minimal.  Patient was discharged to [facility] in May of this year.  No currently home health.  Home Health agency preference provided.  [facility] selected.  Referral made to [facility] with [facility].  Patient has a RW and cane in the home.   RNCM signing off.   Subjective/Objective:                    Action/Plan:   Expected Discharge Date:  07/29/18               Expected Discharge Plan:  Home w Home Health Services  In-House Referral:     Discharge planning Services  CM Consult  Post Acute Care Choice:  Home Health Choice offered to:  Patient  DME Arranged:    DME Agency:     HH Arranged:  PT, Nurse's Aide HH Agency:  [facility]  Status of Service:  Completed, signed off  If discussed at Long Length of Stay Meetings, dates discussed:    Additional Comments:  Chapman Fitch, RN 07/29/2018, 11:47 AM

## 2018-07-29 NOTE — Care Management Obs Status (Signed)
MEDICARE OBSERVATION STATUS NOTIFICATION   Patient Details  Name: Marilyn Andrews MRN: [id number] Date of Birth: December 29, 1935   Medicare Observation Status Notification Given:  No(admitted obs less than 24 hours)    Chapman Fitch, RN 07/29/2018, 10:56 AM

## 2018-07-29 NOTE — Discharge Summary (Signed)
[employer] - [place] at [facility], 82 y.o., DOB 1935-12-16, MRN [id number]. Admission date: 07/28/2018 Discharge Date 07/29/2018 Primary MD Center, [facility] Admitting Physician Ihor Austin, MD  Admission Diagnosis  Hypokalemia [E87.6] Lower abdominal pain [R10.30] Generalized weakness [R53.1] Urinary tract infection with hematuria, site unspecified [N39.0, R31.9]  Discharge Diagnosis   Active Problems:  UTI (urinary tract infection) Dehydration Acute hypokalemia Chronic nausea vomiting      Hospital Course  Marilyn Andrews  is a 82 y.o. female with a known history of diverticular disease, hypertension, arthritis presented to the emergency room for abdominal discomfort, nausea.    Patient was noted to have UTI she was placed under observation overnight given antibiotics IV fluids.  He reports that she has had nausea vomiting ongoing for a while now I have asked her to follow-up with GI as outpatient she also complains of shortness of breath chronically I have asked her to see pulmonary for PFTs.            Consults none  Significant Tests:  See full reports for all details     Ct Abdomen Pelvis W Contrast  Result Date: 07/28/2018 CLINICAL DATA:  82 year old female with pain in the lower abdomen and emesis for the past several days. EXAM: CT ABDOMEN AND PELVIS WITH CONTRAST TECHNIQUE: Multidetector CT imaging of the abdomen and pelvis was performed using the standard protocol following bolus administration of intravenous contrast. CONTRAST:  ISOVUE-300 IOPAMIDOL (ISOVUE-300) INJECTION 61% COMPARISON:  CT the abdomen and pelvis 03/07/2018 FINDINGS: Lower chest: Areas of mild scarring in the lower lobes of the lungs bilaterally. Hepatobiliary: Subcentimeter low-attenuation lesions in segment 6 of the liver, too small to characterize, but similar to the prior study, statistically likely to represent tiny cysts. No other larger more  suspicious appearing cystic or solid hepatic lesions. Status post cholecystectomy. Mild intrahepatic and moderate extrahepatic biliary ductal dilatation, similar to the prior study, likely reflective of post cholecystectomy physiology. No calcified stone in the common bile duct. Pancreas: No pancreatic mass. No pancreatic ductal dilatation. No pancreatic or peripancreatic fluid or inflammatory changes. Spleen: Unremarkable. Adrenals/Urinary Tract: Subcentimeter low-attenuation lesion in the posterior aspect of the interpolar region of the left kidney, too small to characterize, but similar to the prior study and statistically likely to represent a tiny cyst. Right kidney and bilateral adrenal glands are normal in appearance. No hydroureteronephrosis. Urinary bladder is normal in appearance. Stomach/Bowel: Normal appearance of the stomach. No pathologic dilatation of small bowel or colon. Numerous colonic diverticulae are noted, without surrounding inflammatory changes to suggest an acute diverticulitis at this time. The appendix is not confidently identified and may be surgically absent. Regardless, there are no inflammatory changes noted adjacent to the cecum to suggest the presence of an acute appendicitis at this time. Vascular/Lymphatic: Aortic atherosclerosis, without evidence of aneurysm or dissection in the abdominal or pelvic vasculature. No lymphadenopathy noted in the abdomen or pelvis. Reproductive: Status post hysterectomy. Ovaries are unremarkable in appearance. Other: No significant volume of ascites.  No pneumoperitoneum. Musculoskeletal: There are no aggressive appearing lytic or blastic lesions noted in the visualized portions of the skeleton. IMPRESSION: 1. No acute findings are noted in the abdomen or pelvis to account for the patient's symptoms. 2. Persistent intra and extrahepatic biliary ductal dilatation, very similar to the prior study, presumably reflective of benign post cholecystectomy  physiology. No calcified choledocholithiasis. If there is any biochemical evidence or other clinical concern for biliary tract  obstruction, this could be further evaluated with MRI of the abdomen with and without IV gadolinium with MRCP. 3. Colonic diverticulosis without evidence of acute diverticulitis at this time. 4. Aortic atherosclerosis. Electronically Signed   By: Trudie Reed M.D.   On: 07/28/2018 10:43       Today   Subjective:   [name] patient feeling better denies any complaints Objective:   Blood pressure 127/60, pulse 84, temperature 99.1 F (37.3 C), temperature source Oral, resp. rate (!) 24, height 5' (1.524 m), weight 79.4 kg, SpO2 96 %.  .  Intake/Output Summary (Last 24 hours) at 07/29/2018 1813 Last data filed at 07/29/2018 1200 Gross per 24 hour  Intake 1222.47 ml  Output 1950 ml  Net -727.53 ml    Exam VITAL SIGNS: Blood pressure 127/60, pulse 84, temperature 99.1 F (37.3 C), temperature source Oral, resp. rate (!) 24, height 5' (1.524 m), weight 79.4 kg, SpO2 96 %.  GENERAL:  82 y.o.-year-old patient lying in the bed with no acute distress.  EYES: Pupils equal, round, reactive to light and accommodation. No scleral icterus. Extraocular muscles intact.  HEENT: Head atraumatic, normocephalic. Oropharynx and nasopharynx clear.  NECK:  Supple, no jugular venous distention. No thyroid enlargement, no tenderness.  LUNGS: Normal breath sounds bilaterally, no wheezing, rales,rhonchi or crepitation. No use of accessory muscles of respiration.  CARDIOVASCULAR: S1, S2 normal. No murmurs, rubs, or gallops.  ABDOMEN: Soft, nontender, nondistended. Bowel sounds present. No organomegaly or mass.  EXTREMITIES: No pedal edema, cyanosis, or clubbing.  NEUROLOGIC: Cranial nerves II through XII are intact. Muscle strength 5/5 in all extremities. Sensation intact. Gait not checked.  PSYCHIATRIC: The patient is alert and oriented x 3.  SKIN: No obvious rash, lesion, or  ulcer.   Data Review     CBC w Diff:  Lab Results  Component Value Date   WBC 6.6 07/29/2018   HGB 12.6 07/29/2018   HGB 13.2 03/28/2013   HCT 37.1 07/29/2018   HCT 38.8 03/28/2013   PLT 271 07/29/2018   PLT 269 03/28/2013   LYMPHOPCT 6 03/07/2018   LYMPHOPCT 14.7 03/28/2013   MONOPCT 5 03/07/2018   MONOPCT 10.1 03/28/2013   EOSPCT 0 03/07/2018   EOSPCT 0.5 03/28/2013   BASOPCT 0 03/07/2018   BASOPCT 0.4 03/28/2013   CMP:  Lab Results  Component Value Date   NA 141 07/29/2018   NA 143 03/31/2013   K 3.4 (L) 07/29/2018   K 3.7 03/31/2013   CL 110 07/29/2018   CL 109 (H) 03/31/2013   CO2 24 07/29/2018   CO2 28 03/31/2013   BUN 11 07/29/2018   BUN 4 (L) 03/31/2013   CREATININE 0.90 07/29/2018   CREATININE 0.95 03/31/2013   PROT 7.3 07/28/2018   PROT 7.3 03/27/2013   ALBUMIN 3.5 07/28/2018   ALBUMIN 3.3 (L) 03/27/2013   BILITOT 1.0 07/28/2018   BILITOT 0.6 03/27/2013   ALKPHOS 87 07/28/2018   ALKPHOS 97 03/27/2013   AST 24 07/28/2018   AST 26 03/27/2013   ALT 16 07/28/2018   ALT 18 03/27/2013  .  Micro Results Recent Results (from the past 240 hour(s))  Culture, blood (routine x 2)     Status: None (Preliminary result)   Collection Time: 07/28/18  8:09 AM  Result Value Ref Range Status   Specimen Description BLOOD LEFT [facility]  Final   Special Requests   Final    BOTTLES DRAWN AEROBIC AND ANAEROBIC Blood Culture results may not be optimal due  to an excessive volume of blood received in culture bottles   Culture   Final    NO GROWTH < 24 HOURS Performed at [facility], [address] [place], [place] [address]    Report Status PENDING  Incomplete  Culture, blood (routine x 2)     Status: None (Preliminary result)   Collection Time: 07/28/18  8:09 AM  Result Value Ref Range Status   Specimen Description BLOOD RIGHT ANTECUBITAL  Final   Special Requests   Final    BOTTLES DRAWN AEROBIC AND ANAEROBIC Blood Culture adequate volume   Culture    Final    NO GROWTH < 24 HOURS Performed at [facility], [address] [place], [place] [address]    Report Status PENDING  Incomplete        Code Status Orders  (From admission, onward)         Start     Ordered   07/28/18 1610  Do not attempt resuscitation (DNR)  Continuous    Question Answer Comment  In the event of cardiac or respiratory ARREST Do not call a "code blue"   In the event of cardiac or respiratory ARREST Do not perform Intubation, CPR, defibrillation or ACLS   In the event of cardiac or respiratory ARREST Use medication by any route, position, wound care, and other measures to relive pain and suffering. May use oxygen, suction and manual treatment of airway obstruction as needed for comfort.      07/28/18 1609        Code Status History    Date Active Date Inactive Code Status Order ID Comments User Context   03/07/2018 1506 03/15/2018 1822 DNR [id number]  Milagros Loll, MD ED   03/07/2018 1421 03/07/2018 1506 Full Code [id number]  Milagros Loll, MD ED   07/01/2016 0550 07/07/2016 1918 Full Code [id number]  Arnaldo Natal, MD Inpatient          Follow-up Information    Center, [facility]. Go on 08/09/2018.   Specialty:  General Practice Why:  Tuesday October 8th at 10:40am for a follow-up apointment  Contact information: [address] [place] [address] [phone]        Shane Crutch, MD. Go on 08/12/2018.   Specialty:  Pulmonary Disease Why:   at 11am for sob pulmonary function test Contact information: [address] [place] [address] [phone]        Christena Deem, MD. Go on 08/15/2018.   Specialty:  Gastroenterology Why:  at 9:30am  with Jacob Moores for nause and voming chronic Contact information: [address] [facility] [place] [address] [phone]           Discharge Medications   Allergies as of 07/29/2018      Reactions    Penicillins Other (See Comments)   Reaction as a child, patient does not recall Has patient had a PCN reaction causing immediate rash, facial/tongue/throat swelling, SOB or lightheadedness with hypotension: unknown Has patient had a PCN reaction causing severe rash involving mucus membranes or skin necrosis: unknown Has patient had a PCN reaction that required hospitalization: unknown Has patient had a PCN reaction occurring within the last 10 years: No If all of the above answers are "NO", then may proceed with Cephalosporin use.   Sulfa Antibiotics       Medication List    STOP taking these medications   buPROPion 150 MG 12 hr tablet  Commonly known as:  WELLBUTRIN SR   famotidine 20 MG tablet Commonly known as:  PEPCID   sucralfate 1 GM/10ML suspension Commonly known as:  CARAFATE     TAKE these medications   acetaminophen 500 MG tablet Commonly known as:  TYLENOL Take 500-1,000 mg by mouth every 6 (six) hours as needed for mild pain or moderate pain.   amLODipine 5 MG tablet Commonly known as:  NORVASC Take 5 mg by mouth daily.   cefUROXime 500 MG tablet Commonly known as:  CEFTIN Take 1 tablet (500 mg total) by mouth 2 (two) times daily for 5 days.   escitalopram 20 MG tablet Commonly known as:  LEXAPRO Take 1 tablet (20 mg total) by mouth daily.   fluticasone 50 MCG/ACT nasal spray Commonly known as:  FLONASE Place 2 sprays into both nostrils 2 (two) times daily as needed for allergies or rhinitis.   ondansetron 4 MG disintegrating tablet Commonly known as:  ZOFRAN-ODT Take 1 tablet (4 mg total) by mouth every 8 (eight) hours as needed for nausea or vomiting.   ranitidine 150 MG tablet Commonly known as:  ZANTAC Take 150 mg by mouth daily.   triamcinolone cream 0.1 % Commonly known as:  KENALOG Apply 1 application topically daily as needed. Rash on hands          Total Time in preparing paper work, data evaluation and todays exam - 35  minutes  Auburn Bilberry M.D on 07/29/2018 at 6:13 PM [employer]   Office  [phone]

## 2018-07-29 NOTE — Evaluation (Signed)
Physical Therapy Evaluation Patient Details Name: Marilyn Andrews MRN: [id number] DOB: [birth year] Today's Date: 07/29/2018   History of Present Illness  Pt is a 82 y/o F who presented with nausea and abdominal pain. CT abdomen showed diverticular disease but no diverticulitis, no obstruction.  Urinalysis showed infection. Pt's PMH includes diverticulitis, anxiety.    Clinical Impression  Pt admitted with above diagnosis. Pt currently with functional limitations due to the deficits listed below (see PT Problem List). Marilyn Andrews ambulates with [facility] for short distances and RW for longer distances at baseline.  She reports 2 falls in the past 6 months.  Pt independent with bed mobility, supervision provided for ambulation.  Distance limited by chronic LBP.  She demonstrated instability with balance testing this session and will benefit from skilled PT to increase their independence and safety with mobility to allow discharge to the venue listed below.      Follow Up Recommendations Home health PT(to address balance deficits)    Equipment Recommendations  None recommended by PT(pt has walker and cane at home)    Recommendations for Other Services       Precautions / Restrictions Precautions Precautions: Fall Restrictions Weight Bearing Restrictions: No      Mobility  Bed Mobility Overal bed mobility: Independent             General bed mobility comments: No physical assist or cues needed for sit>supine.  Pt standing in room with NT upon PT arrival.  Transfers Overall transfer level: Modified independent Equipment used: None             General transfer comment: Pt with guarded posture but no LOB or instability noted  Ambulation/Gait Ambulation/Gait assistance: Supervision Gait Distance (Feet): 100 Feet Assistive device: None Gait Pattern/deviations: Decreased stride length;WFL(Within Functional Limits)     General Gait Details: Pt with mild instability with higher level  balance activities but no LOB.  Distance limited by chronic LBP.   Stairs            Wheelchair Mobility    Modified Rankin (Stroke Patients Only)       Balance Overall balance assessment: Needs assistance;History of Falls Sitting-balance support: No upper extremity supported;Feet supported Sitting balance-Leahy Scale: Normal     Standing balance support: No upper extremity supported;During functional activity Standing balance-Leahy Scale: Fair Standing balance comment: Pt would likely lose her balance with outside perturbation Single Leg Stance - Right Leg: 1 Single Leg Stance - Left Leg: 1 Tandem Stance - Right Leg: 3 Tandem Stance - Left Leg: 5 Rhomberg - Eyes Opened: 30       Standardized Balance Assessment Standardized Balance Assessment : Dynamic Gait Index   Dynamic Gait Index Level Surface: Normal Change in Gait Speed: Mild Impairment Gait with Horizontal Head Turns: Mild Impairment Gait with Vertical Head Turns: Mild Impairment Gait and Pivot Turn: Mild Impairment Step Over Obstacle: Moderate Impairment Step Around Obstacles: Mild Impairment       Pertinent Vitals/Pain Pain Assessment: 0-10 Pain Score: 8  Pain Location: LBP (chronic per the pt, worst when on her feet) Pain Descriptors / Indicators: Aching Pain Intervention(s): Limited activity within patient's tolerance;Monitored during session    Home Living Family/patient expects to be discharged to:: Private residence Living Arrangements: Alone Available Help at Discharge: Family;Available PRN/intermittently(son lives 7 miles away) Type of Home: House Home Access: Stairs to enter Entrance Stairs-Rails: Right;Left;Can reach both Secretary/administrator of Steps: 4 Home Layout: One level Home Equipment: Environmental consultant - 2 wheels;Cane -  single point      Prior Function Level of Independence: Needs assistance   Gait / Transfers Assistance Needed: Ambulates short distances in home without SPC at times  but typically uses [facility] for shorter distances and RW for longer distances.  2 falls in the past 6 months (last one was 2 weeks ago)   ADL's / Homemaking Assistance Needed: Son does the cooking and cleaning.  Pt takes a sponge bath and dresses independently.  Family does the driving and grocery shopping.         Hand Dominance        Extremity/Trunk Assessment   Upper Extremity Assessment Upper Extremity Assessment: Overall WFL for tasks assessed    Lower Extremity Assessment Lower Extremity Assessment: (BLE strength grossly 4/5)    Cervical / Trunk Assessment Cervical / Trunk Assessment: Other exceptions Cervical / Trunk Exceptions: h/o chronic LBP  Communication   Communication: HOH  Cognition Arousal/Alertness: Awake/alert Behavior During Therapy: WFL for tasks assessed/performed Overall Cognitive Status: Within Functional Limits for tasks assessed                                        General Comments General comments (skin integrity, edema, etc.): Balance testing: eyes closed, normal stance no issue. Pt demonstrates very mild SOB after ambulation, SpO2 96% on RA and pulse 85.     Exercises Other Exercises Other Exercises: Encouraged pt to ambulate at least 3x/day in hallway with nursing staff   Assessment/Plan    PT Assessment Patient needs continued PT services  PT Problem List Decreased strength;Decreased balance;Decreased activity tolerance;Decreased knowledge of use of DME;Decreased safety awareness;Pain       PT Treatment Interventions DME instruction;Gait training;Stair training;Functional mobility training;Therapeutic activities;Therapeutic exercise;Balance training;Neuromuscular re-education;Patient/family education    PT Goals (Current goals can be found in the Care Plan section)  Acute Rehab PT Goals Patient Stated Goal: to return home and receive PT to address balance PT Goal Formulation: With patient Time For Goal Achievement:  08/12/18 Potential to Achieve Goals: Good    Frequency Min 2X/week   Barriers to discharge        Co-evaluation               AM-PAC PT "6 Clicks" Daily Activity  Outcome Measure Difficulty turning over in bed (including adjusting bedclothes, sheets and blankets)?: None Difficulty moving from lying on back to sitting on the side of the bed? : None Difficulty sitting down on and standing up from a chair with arms (e.g., wheelchair, bedside commode, etc,.)?: None Help needed moving to and from a bed to chair (including a wheelchair)?: None Help needed walking in hospital room?: A Little Help needed climbing 3-5 steps with a railing? : A Little 6 Click Score: 22    End of Session Equipment Utilized During Treatment: Gait belt Activity Tolerance: Patient tolerated treatment well;Patient limited by pain Patient left: in bed;with call bell/phone within reach;with bed alarm set Nurse Communication: Mobility status PT Visit Diagnosis: Unsteadiness on feet (R26.81);Repeated falls (R29.6);Muscle weakness (generalized) (M62.81);History of falling (Z91.81)    Time: 1610-9604 PT Time Calculation (min) (ACUTE ONLY): 23 min   Charges:   PT Evaluation $PT Eval Low Complexity: 1 Low PT Treatments $Neuromuscular Re-education: 8-22 mins        Encarnacion Chu PT, DPT 07/29/2018, 12:00 PM

## 2018-08-02 LAB — CULTURE, BLOOD (ROUTINE X 2)
Culture: NO GROWTH
Culture: NO GROWTH
Special Requests: ADEQUATE

## 2018-08-12 ENCOUNTER — Inpatient Hospital Stay: Payer: Medicare Other | Admitting: Internal Medicine

## 2018-08-23 ENCOUNTER — Encounter: Payer: Self-pay | Admitting: *Deleted

## 2018-08-26 ENCOUNTER — Institutional Professional Consult (permissible substitution): Payer: Medicare Other | Admitting: Internal Medicine

## 2018-08-30 ENCOUNTER — Ambulatory Visit
Admission: RE | Admit: 2018-08-30 | Discharge: 2018-08-30 | Disposition: A | Discharge status: Home / Self Care | Payer: Medicare Other | Source: Ambulatory Visit | Attending: Ophthalmology | Admitting: Ophthalmology

## 2018-08-30 ENCOUNTER — Ambulatory Visit: Payer: Medicare Other | Admitting: Anesthesiology

## 2018-08-30 ENCOUNTER — Encounter: Payer: Self-pay | Admitting: Emergency Medicine

## 2018-08-30 ENCOUNTER — Encounter
Admission: RE | Disposition: A | Discharge status: Home / Self Care | Payer: Self-pay | Source: Ambulatory Visit | Attending: Ophthalmology

## 2018-08-30 ENCOUNTER — Other Ambulatory Visit: Payer: Self-pay

## 2018-08-30 DIAGNOSIS — F419 Anxiety disorder, unspecified: Secondary | ICD-10-CM | POA: Insufficient documentation

## 2018-08-30 DIAGNOSIS — F329 Major depressive disorder, single episode, unspecified: Secondary | ICD-10-CM | POA: Diagnosis not present

## 2018-08-30 DIAGNOSIS — H2511 Age-related nuclear cataract, right eye: Secondary | ICD-10-CM | POA: Insufficient documentation

## 2018-08-30 DIAGNOSIS — I10 Essential (primary) hypertension: Secondary | ICD-10-CM | POA: Diagnosis not present

## 2018-08-30 DIAGNOSIS — Z79899 Other long term (current) drug therapy: Secondary | ICD-10-CM | POA: Insufficient documentation

## 2018-08-30 DIAGNOSIS — M1990 Unspecified osteoarthritis, unspecified site: Secondary | ICD-10-CM | POA: Insufficient documentation

## 2018-08-30 HISTORY — DX: Edema, unspecified: R60.9

## 2018-08-30 HISTORY — DX: Major depressive disorder, single episode, unspecified: F32.9

## 2018-08-30 HISTORY — PX: CATARACT EXTRACTION W/PHACO: SHX586

## 2018-08-30 HISTORY — DX: Unspecified hearing loss, unspecified ear: H91.90

## 2018-08-30 HISTORY — DX: Depression, unspecified: F32.A

## 2018-08-30 HISTORY — DX: Dyspnea, unspecified: R06.00

## 2018-08-30 SURGERY — PHACOEMULSIFICATION, CATARACT, WITH IOL INSERTION
Anesthesia: Monitor Anesthesia Care | Site: Eye | Laterality: Right

## 2018-08-30 MED ORDER — ARMC OPHTHALMIC DILATING DROPS
1.0000 "application " | OPHTHALMIC | Status: AC
  Administered 2018-08-30 (×2): 1 via OPHTHALMIC
  Administered 2018-08-30: 08:00:00 via OPHTHALMIC

## 2018-08-30 MED ORDER — MIDAZOLAM HCL 2 MG/2ML IJ SOLN
INTRAMUSCULAR | Status: AC
  Filled 2018-08-30: qty 2

## 2018-08-30 MED ORDER — FENTANYL CITRATE (PF) 100 MCG/2ML IJ SOLN
INTRAMUSCULAR | Status: DC | PRN
  Administered 2018-08-30: 50 ug via INTRAVENOUS

## 2018-08-30 MED ORDER — ARMC OPHTHALMIC DILATING DROPS
OPHTHALMIC | Status: AC
  Filled 2018-08-30: qty 0.5

## 2018-08-30 MED ORDER — NA HYALUR & NA CHOND-NA HYALUR 0.55-0.5 ML IO KIT
PACK | INTRAOCULAR | Status: AC
  Filled 2018-08-30: qty 1.05

## 2018-08-30 MED ORDER — TETRACAINE HCL 0.5 % OP SOLN
OPHTHALMIC | Status: AC
  Administered 2018-08-30: 1 [drp] via OPHTHALMIC
  Filled 2018-08-30: qty 4

## 2018-08-30 MED ORDER — LIDOCAINE HCL (PF) 4 % IJ SOLN
INTRAOCULAR | Status: DC | PRN
  Administered 2018-08-30: 4 mL via OPHTHALMIC

## 2018-08-30 MED ORDER — CARBACHOL 0.01 % IO SOLN
INTRAOCULAR | Status: DC | PRN
  Administered 2018-08-30: 0.5 mL via INTRAOCULAR

## 2018-08-30 MED ORDER — FENTANYL CITRATE (PF) 100 MCG/2ML IJ SOLN
INTRAMUSCULAR | Status: AC
  Filled 2018-08-30: qty 2

## 2018-08-30 MED ORDER — POVIDONE-IODINE 5 % OP SOLN
OPHTHALMIC | Status: AC
  Filled 2018-08-30: qty 30

## 2018-08-30 MED ORDER — SODIUM CHLORIDE 0.9 % IV SOLN
INTRAVENOUS | Status: DC
  Administered 2018-08-30: 08:00:00 via INTRAVENOUS

## 2018-08-30 MED ORDER — TETRACAINE HCL 0.5 % OP SOLN
1.0000 [drp] | OPHTHALMIC | Status: AC | PRN
  Administered 2018-08-30 (×3): 1 [drp] via OPHTHALMIC

## 2018-08-30 MED ORDER — MIDAZOLAM HCL 2 MG/2ML IJ SOLN
INTRAMUSCULAR | Status: DC | PRN
  Administered 2018-08-30: 1 mg via INTRAVENOUS

## 2018-08-30 MED ORDER — MOXIFLOXACIN HCL 0.5 % OP SOLN
OPHTHALMIC | Status: DC | PRN
  Administered 2018-08-30: 0.2 mL via OPHTHALMIC

## 2018-08-30 MED ORDER — LIDOCAINE HCL (PF) 4 % IJ SOLN
INTRAMUSCULAR | Status: AC
  Filled 2018-08-30: qty 5

## 2018-08-30 MED ORDER — MOXIFLOXACIN HCL 0.5 % OP SOLN: 1.0000 [drp] | OPHTHALMIC | Status: DC | PRN

## 2018-08-30 MED ORDER — EPINEPHRINE PF 1 MG/ML IJ SOLN
INTRAOCULAR | Status: DC | PRN
  Administered 2018-08-30: 09:00:00 via OPHTHALMIC

## 2018-08-30 MED ORDER — NA CHONDROIT SULF-NA HYALURON 40-17 MG/ML IO SOLN
INTRAOCULAR | Status: DC | PRN
  Administered 2018-08-30: 1 mL via INTRAOCULAR

## 2018-08-30 MED ORDER — MOXIFLOXACIN HCL 0.5 % OP SOLN
OPHTHALMIC | Status: AC
  Filled 2018-08-30: qty 3

## 2018-08-30 MED ORDER — EPINEPHRINE PF 1 MG/ML IJ SOLN
INTRAMUSCULAR | Status: AC
  Filled 2018-08-30: qty 1

## 2018-08-30 MED ORDER — POVIDONE-IODINE 5 % OP SOLN
OPHTHALMIC | Status: DC | PRN
  Administered 2018-08-30: 1 via OPHTHALMIC

## 2018-08-30 SURGICAL SUPPLY — 16 items
GLOVE BIO SURGEON STRL SZ8 (GLOVE) ×3 IMPLANT
GLOVE BIOGEL M 6.5 STRL (GLOVE) ×3 IMPLANT
GLOVE SURG LX 8.0 MICRO (GLOVE) ×2
GLOVE SURG LX STRL 8.0 MICRO (GLOVE) ×1 IMPLANT
GOWN STRL REUS W/ TWL LRG LVL3 (GOWN DISPOSABLE) ×2 IMPLANT
GOWN STRL REUS W/TWL LRG LVL3 (GOWN DISPOSABLE) ×6
LABEL CATARACT MEDS ST (LABEL) ×3 IMPLANT
LENS IOL TECNIS ITEC 20.0 (Intraocular Lens) ×2 IMPLANT
PACK CATARACT (MISCELLANEOUS) ×3 IMPLANT
PACK CATARACT BRASINGTON LX (MISCELLANEOUS) ×3 IMPLANT
PACK EYE AFTER SURG (MISCELLANEOUS) ×3 IMPLANT
SOL BSS BAG (MISCELLANEOUS) ×3
SOLUTION BSS BAG (MISCELLANEOUS) ×1 IMPLANT
SYR 5ML LL (SYRINGE) ×3 IMPLANT
WATER STERILE IRR 250ML POUR (IV SOLUTION) ×3 IMPLANT
WIPE NON LINTING 3.25X3.25 (MISCELLANEOUS) ×3 IMPLANT

## 2018-08-30 NOTE — Transfer of Care (Signed)
Immediate Anesthesia Transfer of Care Note  Patient: Marilyn Andrews  Procedure(s) Performed: CATARACT EXTRACTION PHACO AND INTRAOCULAR LENS PLACEMENT (IOC) (Right Eye)  Patient Location: PACU  Anesthesia Type:MAC  Level of Consciousness: awake, alert  and oriented  Airway & Oxygen Therapy: Patient Spontanous Breathing  Post-op Assessment: Report given to RN and Post -op Vital signs reviewed and stable  Post vital signs: Reviewed and stable  Last Vitals:  Vitals Value Taken Time  BP    Temp    Pulse    Resp    SpO2      Last Pain:  Vitals:   08/30/18 0715  TempSrc: Temporal  PainSc: 4          Complications: No apparent anesthesia complications

## 2018-08-30 NOTE — Anesthesia Postprocedure Evaluation (Signed)
Anesthesia Post Note  Patient: Marilyn Andrews  Procedure(s) Performed: CATARACT EXTRACTION PHACO AND INTRAOCULAR LENS PLACEMENT (IOC) (Right Eye)  Patient location during evaluation: PACU Anesthesia Type: MAC Level of consciousness: awake and alert and oriented Pain management: pain level controlled Vital Signs Assessment: post-procedure vital signs reviewed and stable Respiratory status: spontaneous breathing, nonlabored ventilation and respiratory function stable Cardiovascular status: blood pressure returned to baseline and stable Postop Assessment: no signs of nausea or vomiting Anesthetic complications: no     Last Vitals:  Vitals:   08/30/18 0850 08/30/18 0900  BP: (!) 166/58 (!) 183/67  Pulse: 66 61  Resp: 16 16  Temp: 36.9 C   SpO2: 97% 96%    Last Pain:  Vitals:   08/30/18 0900  TempSrc:   PainSc: 1                  Nikkita Adeyemi

## 2018-08-30 NOTE — Op Note (Signed)
PREOPERATIVE DIAGNOSIS:  Nuclear sclerotic cataract of the right eye.   POSTOPERATIVE DIAGNOSIS:  NUCLEAR SCLEROTIC CATARACT RIGHT EYE   OPERATIVE PROCEDURE: Procedure(s): CATARACT EXTRACTION PHACO AND INTRAOCULAR LENS PLACEMENT (IOC)   SURGEON:  Galen Manila, MD.   ANESTHESIA:  Anesthesiologist: Alver Fisher, MD CRNA: Danelle Berry, CRNA  1.      Managed anesthesia care. 2.      0.77ml of Shugarcaine was instilled in the eye following the paracentesis.   COMPLICATIONS:  None.   TECHNIQUE:   Stop and chop   DESCRIPTION OF PROCEDURE:  The patient was examined and consented in the preoperative holding area where the aforementioned topical anesthesia was applied to the right eye and then brought back to the Operating Room where the right eye was prepped and draped in the usual sterile ophthalmic fashion and a lid speculum was placed. A paracentesis was created with the side port blade and the anterior chamber was filled with viscoelastic. A near clear corneal incision was performed with the steel keratome. A continuous curvilinear capsulorrhexis was performed with a cystotome followed by the capsulorrhexis forceps. Hydrodissection and hydrodelineation were carried out with BSS on a blunt cannula. The lens was removed in a stop and chop  technique and the remaining cortical material was removed with the irrigation-aspiration handpiece. The capsular bag was inflated with viscoelastic and the Technis [id number]  lens was placed in the capsular bag without complication. The remaining viscoelastic was removed from the eye with the irrigation-aspiration handpiece. The wounds were hydrated. The anterior chamber was flushed with Miostat and the eye was inflated to physiologic pressure. 0.38ml of Vigamox was placed in the anterior chamber. The wounds were found to be water tight. The eye was dressed with Vigamox. The patient was given protective glasses to wear throughout the day and a shield with which to  sleep tonight. The patient was also given drops with which to begin a drop regimen today and will follow-up with me in one day. Implant Name Type Inv. Item Serial No. Manufacturer Lot No. LRB No. Used  LENS IOL DIOP 20.0 - [id number] Intraocular Lens LENS IOL DIOP 20.0 [id number] AMO  Right 1   Procedure(s) with comments: CATARACT EXTRACTION PHACO AND INTRAOCULAR LENS PLACEMENT (IOC) (Right) - [place] 01:07.1 CDE 13.66 Fluid Pack Lot # [id number]  Electronically signed: Galen Manila 08/30/2018 8:47 AM

## 2018-08-30 NOTE — Anesthesia Post-op Follow-up Note (Signed)
Anesthesia QCDR form completed.        

## 2018-08-30 NOTE — Anesthesia Preprocedure Evaluation (Signed)
Anesthesia Evaluation  Patient identified by MRN, date of birth, ID band Patient awake    Reviewed: Allergy & Precautions, NPO status , Patient's Chart, lab work & pertinent test results  History of Anesthesia Complications Negative for: history of anesthetic complications  Airway Mallampati: III  TM Distance: >3 FB Neck ROM: Full    Dental  (+) Poor Dentition   Pulmonary neg sleep apnea, neg COPD,    breath sounds clear to auscultation- rhonchi (-) wheezing      Cardiovascular hypertension, Pt. on medications (-) CAD, (-) Past MI, (-) Cardiac Stents and (-) CABG  Rhythm:Regular Rate:Normal - Systolic murmurs and - Diastolic murmurs    Neuro/Psych PSYCHIATRIC DISORDERS Anxiety Depression negative neurological ROS     GI/Hepatic negative GI ROS, Neg liver ROS,   Endo/Other  negative endocrine ROSneg diabetes  Renal/GU negative Renal ROS     Musculoskeletal  (+) Arthritis ,   Abdominal (+) + obese,   Peds  Hematology negative hematology ROS (+)   Anesthesia Other Findings Past Medical History: No date: Anxiety No date: Arthritis No date: Depression No date: Diverticulitis No date: Dyspnea No date: Edema     Comment:  FEET/LEGS No date: HOH (hard of hearing) No date: Hypertension   Reproductive/Obstetrics                             Anesthesia Physical Anesthesia Plan  ASA: II  Anesthesia Plan: MAC   Post-op Pain Management:    Induction: Intravenous  PONV Risk Score and Plan: 2 and Midazolam  Airway Management Planned: Natural Airway  Additional Equipment:   Intra-op Plan:   Post-operative Plan:   Informed Consent: I have reviewed the patients History and Physical, chart, labs and discussed the procedure including the risks, benefits and alternatives for the proposed anesthesia with the patient or authorized representative who has indicated his/her understanding and  acceptance.     Plan Discussed with: CRNA and Anesthesiologist  Anesthesia Plan Comments:         Anesthesia Quick Evaluation

## 2018-08-30 NOTE — H&P (Signed)
All labs reviewed. Abnormal studies sent to patients PCP when indicated.  Previous H&P reviewed, patient examined, there are NO CHANGES.  Marilyn Noa Porfilio10/29/20198:22 AM

## 2018-08-30 NOTE — Discharge Instructions (Signed)
Eye Surgery Discharge Instructions    Expect mild scratchy sensation or mild soreness. DO NOT RUB YOUR EYE!  The day of surgery:  Minimal physical activity, but bed rest is not required  No reading, computer work, or close hand work  No bending, lifting, or straining.  May watch TV  For 24 hours:  No driving, legal decisions, or alcoholic beverages  Safety precautions  Eat anything you prefer: It is better to start with liquids, then soup then solid foods.  _____ Eye patch should be worn until postoperative exam tomorrow.  ____ Solar shield eyeglasses should be worn for comfort in the sunlight/patch while sleeping  Resume all regular medications including aspirin or Coumadin if these were discontinued prior to surgery. You may shower, bathe, shave, or wash your hair. Tylenol may be taken for mild discomfort.  Call your doctor if you experience significant pain, nausea, or vomiting, fever > 101 or other signs of infection. [phone] or [phone] Specific instructions:  Follow-up Information    Galen Manila, MD Follow up on 08/31/2018.   Specialty:  Ophthalmology Why:  9:45 Contact information: [address] [place] [address] [phone]

## 2018-09-06 NOTE — Progress Notes (Deleted)
The patient is an 82 year old female, she was recently admitted to the hospital for 1 day on 07/28/2018 for acute nausea, vomiting, dehydration abdominal pain.  At the time of discharge was noted that the patient had some chronic dyspnea, she was therefore referred to pulmonary for further follow-up and possible lung function testing.  **CT abdomen and pelvis 07/28/2018>> review of lower cuts of lungs shows bibasilar atelectasis **Chest x-ray 02/28/16>> bibasilar atelectasis with reduced lung volumes due to obesity.

## 2018-09-07 ENCOUNTER — Institutional Professional Consult (permissible substitution): Payer: Medicare Other | Admitting: Internal Medicine

## 2018-09-15 ENCOUNTER — Encounter: Payer: Self-pay | Admitting: *Deleted

## 2018-09-20 ENCOUNTER — Ambulatory Visit
Admission: RE | Admit: 2018-09-20 | Discharge: 2018-09-20 | Disposition: A | Discharge status: Home / Self Care | Payer: Medicare Other | Source: Ambulatory Visit | Attending: Ophthalmology | Admitting: Ophthalmology

## 2018-09-20 ENCOUNTER — Other Ambulatory Visit: Payer: Self-pay

## 2018-09-20 ENCOUNTER — Ambulatory Visit: Payer: Medicare Other | Admitting: Anesthesiology

## 2018-09-20 ENCOUNTER — Encounter
Admission: RE | Disposition: A | Discharge status: Home / Self Care | Payer: Self-pay | Source: Ambulatory Visit | Attending: Ophthalmology

## 2018-09-20 DIAGNOSIS — Z79899 Other long term (current) drug therapy: Secondary | ICD-10-CM | POA: Diagnosis not present

## 2018-09-20 DIAGNOSIS — F329 Major depressive disorder, single episode, unspecified: Secondary | ICD-10-CM | POA: Insufficient documentation

## 2018-09-20 DIAGNOSIS — F419 Anxiety disorder, unspecified: Secondary | ICD-10-CM | POA: Insufficient documentation

## 2018-09-20 DIAGNOSIS — I10 Essential (primary) hypertension: Secondary | ICD-10-CM | POA: Insufficient documentation

## 2018-09-20 DIAGNOSIS — H2512 Age-related nuclear cataract, left eye: Secondary | ICD-10-CM | POA: Diagnosis not present

## 2018-09-20 DIAGNOSIS — K589 Irritable bowel syndrome without diarrhea: Secondary | ICD-10-CM | POA: Diagnosis not present

## 2018-09-20 HISTORY — PX: CATARACT EXTRACTION W/PHACO: SHX586

## 2018-09-20 SURGERY — PHACOEMULSIFICATION, CATARACT, WITH IOL INSERTION
Anesthesia: Monitor Anesthesia Care | Site: Eye | Laterality: Left

## 2018-09-20 MED ORDER — CARBACHOL 0.01 % IO SOLN
INTRAOCULAR | Status: DC | PRN
  Administered 2018-09-20: .5 mL via INTRAOCULAR

## 2018-09-20 MED ORDER — TETRACAINE HCL 0.5 % OP SOLN
OPHTHALMIC | Status: AC
  Administered 2018-09-20: 1 [drp] via OPHTHALMIC
  Filled 2018-09-20: qty 4

## 2018-09-20 MED ORDER — TETRACAINE HCL 0.5 % OP SOLN
1.0000 [drp] | OPHTHALMIC | Status: AC | PRN
  Administered 2018-09-20 (×3): 1 [drp] via OPHTHALMIC

## 2018-09-20 MED ORDER — MOXIFLOXACIN HCL 0.5 % OP SOLN
OPHTHALMIC | Status: AC
  Filled 2018-09-20: qty 3

## 2018-09-20 MED ORDER — ARMC OPHTHALMIC DILATING DROPS
1.0000 "application " | OPHTHALMIC | Status: AC
  Administered 2018-09-20 (×3): 1 via OPHTHALMIC

## 2018-09-20 MED ORDER — MOXIFLOXACIN HCL 0.5 % OP SOLN
OPHTHALMIC | Status: DC | PRN
  Administered 2018-09-20: .2 mL via OPHTHALMIC

## 2018-09-20 MED ORDER — NA CHONDROIT SULF-NA HYALURON 40-17 MG/ML IO SOLN
INTRAOCULAR | Status: AC
  Filled 2018-09-20: qty 1

## 2018-09-20 MED ORDER — POVIDONE-IODINE 5 % OP SOLN
OPHTHALMIC | Status: AC
  Filled 2018-09-20: qty 30

## 2018-09-20 MED ORDER — LIDOCAINE HCL (PF) 4 % IJ SOLN
INTRAOCULAR | Status: DC | PRN
  Administered 2018-09-20: 2 mL via OPHTHALMIC

## 2018-09-20 MED ORDER — MOXIFLOXACIN HCL 0.5 % OP SOLN: 1.0000 [drp] | OPHTHALMIC | Status: DC | PRN

## 2018-09-20 MED ORDER — POVIDONE-IODINE 5 % OP SOLN
OPHTHALMIC | Status: DC | PRN
  Administered 2018-09-20: 1 via OPHTHALMIC

## 2018-09-20 MED ORDER — MIDAZOLAM HCL 2 MG/2ML IJ SOLN
INTRAMUSCULAR | Status: AC
  Filled 2018-09-20: qty 2

## 2018-09-20 MED ORDER — SODIUM CHLORIDE 0.9 % IV SOLN
INTRAVENOUS | Status: DC
  Administered 2018-09-20: 11:00:00 via INTRAVENOUS

## 2018-09-20 MED ORDER — EPINEPHRINE PF 1 MG/ML IJ SOLN
INTRAOCULAR | Status: DC | PRN
  Administered 2018-09-20: 1 mL via OPHTHALMIC

## 2018-09-20 MED ORDER — ARMC OPHTHALMIC DILATING DROPS
OPHTHALMIC | Status: AC
  Administered 2018-09-20: 1 via OPHTHALMIC
  Filled 2018-09-20: qty 0.5

## 2018-09-20 MED ORDER — EPINEPHRINE PF 1 MG/ML IJ SOLN
INTRAMUSCULAR | Status: AC
  Filled 2018-09-20: qty 1

## 2018-09-20 MED ORDER — NA CHONDROIT SULF-NA HYALURON 40-17 MG/ML IO SOLN
INTRAOCULAR | Status: DC | PRN
  Administered 2018-09-20: 1 mL via INTRAOCULAR

## 2018-09-20 MED ORDER — LIDOCAINE HCL (PF) 4 % IJ SOLN
INTRAMUSCULAR | Status: AC
  Filled 2018-09-20: qty 5

## 2018-09-20 MED ORDER — MIDAZOLAM HCL 2 MG/2ML IJ SOLN
INTRAMUSCULAR | Status: DC | PRN
  Administered 2018-09-20 (×2): 1 mg via INTRAVENOUS

## 2018-09-20 SURGICAL SUPPLY — 16 items
GLOVE BIO SURGEON STRL SZ8 (GLOVE) ×3 IMPLANT
GLOVE BIOGEL M 6.5 STRL (GLOVE) ×3 IMPLANT
GLOVE SURG LX 8.0 MICRO (GLOVE) ×2
GLOVE SURG LX STRL 8.0 MICRO (GLOVE) ×1 IMPLANT
GOWN STRL REUS W/ TWL LRG LVL3 (GOWN DISPOSABLE) ×2 IMPLANT
GOWN STRL REUS W/TWL LRG LVL3 (GOWN DISPOSABLE) ×6
LABEL CATARACT MEDS ST (LABEL) ×3 IMPLANT
LENS IOL TECNIS ITEC 21.0 (Intraocular Lens) ×2 IMPLANT
PACK CATARACT (MISCELLANEOUS) ×3 IMPLANT
PACK CATARACT BRASINGTON LX (MISCELLANEOUS) ×3 IMPLANT
PACK EYE AFTER SURG (MISCELLANEOUS) ×3 IMPLANT
SOL BSS BAG (MISCELLANEOUS) ×3
SOLUTION BSS BAG (MISCELLANEOUS) ×1 IMPLANT
SYR 5ML LL (SYRINGE) ×3 IMPLANT
WATER STERILE IRR 250ML POUR (IV SOLUTION) ×3 IMPLANT
WIPE NON LINTING 3.25X3.25 (MISCELLANEOUS) ×3 IMPLANT

## 2018-09-20 NOTE — Op Note (Signed)
PREOPERATIVE DIAGNOSIS:  Nuclear sclerotic cataract of the left eye.   POSTOPERATIVE DIAGNOSIS:  Nuclear sclerotic cataract of the left eye.   OPERATIVE PROCEDURE: Procedure(s): CATARACT EXTRACTION PHACO AND INTRAOCULAR LENS PLACEMENT (IOC)   SURGEON:  Galen ManilaWilliam Devondre Guzzetta, MD.   ANESTHESIA:  Anesthesiologist: Piscitello, Cleda MccreedyJoseph K, MD CRNA: Dava NajjarFrazier, Susan, CRNA  1.      Managed anesthesia care. 2.     0.391ml of Shugarcaine was instilled following the paracentesis   COMPLICATIONS:  None.   TECHNIQUE:   Stop and chop   DESCRIPTION OF PROCEDURE:  The patient was examined and consented in the preoperative holding area where the aforementioned topical anesthesia was applied to the left eye and then brought back to the Operating Room where the left eye was prepped and draped in the usual sterile ophthalmic fashion and a lid speculum was placed. A paracentesis was created with the side port blade and the anterior chamber was filled with viscoelastic. A near clear corneal incision was performed with the steel keratome. A continuous curvilinear capsulorrhexis was performed with a cystotome followed by the capsulorrhexis forceps. Hydrodissection and hydrodelineation were carried out with BSS on a blunt cannula. The lens was removed in a stop and chop  technique and the remaining cortical material was removed with the irrigation-aspiration handpiece. The capsular bag was inflated with viscoelastic and the Technis [id number] lens was placed in the capsular bag without complication. The remaining viscoelastic was removed from the eye with the irrigation-aspiration handpiece. The wounds were hydrated. The anterior chamber was flushed with Miostat and the eye was inflated to physiologic pressure. 0.521ml Vigamox was placed in the anterior chamber. The wounds were found to be water tight. The eye was dressed with Vigamox. The patient was given protective glasses to wear throughout the day and a shield with which to sleep  tonight. The patient was also given drops with which to begin a drop regimen today and will follow-up with me in one day. Implant Name Type Inv. Item Serial No. Manufacturer Lot No. LRB No. Used  LENS IOL DIOP 21.0 - [id number] Intraocular Lens LENS IOL DIOP 21.0 [id number]  Left 1    Procedure(s) with comments: CATARACT EXTRACTION PHACO AND INTRAOCULAR LENS PLACEMENT (IOC) (Left) - US 01:09 CDE 11.45 fluid pack lot # [id number]  Electronically signed: Galen ManilaWilliam Elmore Hyslop 09/20/2018 12:07 PM

## 2018-09-20 NOTE — Discharge Instructions (Signed)
Eye Surgery Discharge Instructions  Expect mild scratchy sensation or mild soreness. DO NOT RUB YOUR EYE!  The day of surgery:  Minimal physical activity, but bed rest is not required  No reading, computer work, or close hand work  No bending, lifting, or straining.  May watch TV  For 24 hours:  No driving, legal decisions, or alcoholic beverages  Safety precautions  Eat anything you prefer: It is better to start with liquids, then soup then solid foods.  _____ Eye patch should be worn until postoperative exam tomorrow.  ____ Solar shield eyeglasses should be worn for comfort in the sunlight/patch while sleeping  Resume all regular medications including aspirin or Coumadin if these were discontinued prior to surgery. You may shower, bathe, shave, or wash your hair. Tylenol may be taken for mild discomfort.  Call your doctor if you experience significant pain, nausea, or vomiting, fever > 101 or other signs of infection. [phone] or [phone] Specific instructions:  Follow-up Information    Galen ManilaPorfilio, William, MD Follow up.   Specialty:  Ophthalmology Why:  09/21/18 at10:25 Contact information: [address] [place] [address] [phone]          Eye Surgery Discharge Instructions  Expect mild scratchy sensation or mild soreness. DO NOT RUB YOUR EYE!  The day of surgery:  Minimal physical activity, but bed rest is not required  No reading, computer work, or close hand work  No bending, lifting, or straining.  May watch TV  For 24 hours:  No driving, legal decisions, or alcoholic beverages  Safety precautions  Eat anything you prefer: It is better to start with liquids, then soup then solid foods.  _____ Eye patch should be worn until postoperative exam tomorrow.  ____ Solar shield eyeglasses should be worn for comfort in the sunlight/patch while sleeping  Resume all regular medications including aspirin or Coumadin if these were  discontinued prior to surgery. You may shower, bathe, shave, or wash your hair. Tylenol may be taken for mild discomfort.  Call your doctor if you experience significant pain, nausea, or vomiting, fever > 101 or other signs of infection. [phone] or [phone] Specific instructions:  Follow-up Information    Galen ManilaPorfilio, William, MD Follow up.   Specialty:  Ophthalmology Why:  09/21/18 at10:25 Contact information: [address] [place] [id number] [phone]

## 2018-09-20 NOTE — Anesthesia Postprocedure Evaluation (Signed)
Anesthesia Post Note  Patient: Tearsa O Spilker  Procedure(s) Performed: CATARACT EXTRACTION PHACO AND INTRAOCULAR LENS PLACEMENT (IOC) (Left Eye)  Patient location during evaluation: Short Stay Anesthesia Type: MAC Level of consciousness: oriented, patient cooperative and awake and alert Pain management: satisfactory to patient Vital Signs Assessment: post-procedure vital signs reviewed and stable Respiratory status: spontaneous breathing, respiratory function stable and nonlabored ventilation Cardiovascular status: blood pressure returned to baseline and stable Postop Assessment: no headache and no apparent nausea or vomiting Anesthetic complications: no     Last Vitals:  Vitals:   09/20/18 1103 09/20/18 1207  BP: 129/66 (!) 141/59  Pulse: 63 61  Resp: 18 16  Temp: (!) 35.6 C 36.4 C  SpO2: 98% 97%    Last Pain:  Vitals:   09/20/18 1207  TempSrc: Temporal  PainSc: 3                  Eben Burow

## 2018-09-20 NOTE — H&P (Signed)
All labs reviewed. Abnormal studies sent to patients PCP when indicated.  Previous H&P reviewed, patient examined, there are NO CHANGES.  Katelind Pytel Porfilio11/19/201911:43 AM

## 2018-09-20 NOTE — Anesthesia Preprocedure Evaluation (Signed)
Anesthesia Evaluation  Patient identified by MRN, date of birth, ID band Patient awake    Reviewed: Allergy & Precautions, NPO status , Patient's Chart, lab work & pertinent test results  History of Anesthesia Complications Negative for: history of anesthetic complications  Airway Mallampati: III  TM Distance: >3 FB Neck ROM: Full    Dental  (+) Poor Dentition   Pulmonary shortness of breath, neg sleep apnea, neg COPD,    breath sounds clear to auscultation- rhonchi (-) wheezing      Cardiovascular hypertension, Pt. on medications (-) CAD, (-) Past MI, (-) Cardiac Stents and (-) CABG  Rhythm:Regular Rate:Normal - Systolic murmurs and - Diastolic murmurs    Neuro/Psych PSYCHIATRIC DISORDERS Anxiety Depression negative neurological ROS     GI/Hepatic negative GI ROS, Neg liver ROS,   Endo/Other  negative endocrine ROSneg diabetes  Renal/GU negative Renal ROS     Musculoskeletal  (+) Arthritis ,   Abdominal (+) + obese,   Peds  Hematology negative hematology ROS (+)   Anesthesia Other Findings Past Medical History: No date: Anxiety No date: Arthritis No date: Depression No date: Diverticulitis No date: Dyspnea No date: Edema     Comment:  FEET/LEGS No date: HOH (hard of hearing) No date: Hypertension   Reproductive/Obstetrics                             Anesthesia Physical  Anesthesia Plan  ASA: III  Anesthesia Plan: MAC   Post-op Pain Management:    Induction: Intravenous  PONV Risk Score and Plan: 2 and Midazolam  Airway Management Planned: Natural Airway  Additional Equipment:   Intra-op Plan:   Post-operative Plan:   Informed Consent: I have reviewed the patients History and Physical, chart, labs and discussed the procedure including the risks, benefits and alternatives for the proposed anesthesia with the patient or authorized representative who has indicated  his/her understanding and acceptance.     Plan Discussed with: CRNA and Anesthesiologist  Anesthesia Plan Comments:         Anesthesia Quick Evaluation

## 2018-09-20 NOTE — Transfer of Care (Signed)
Immediate Anesthesia Transfer of Care Note  Patient: Marilyn Andrews  Procedure(s) Performed: CATARACT EXTRACTION PHACO AND INTRAOCULAR LENS PLACEMENT (IOC) (Left Eye)  Patient Location: Short Stay  Anesthesia Type:MAC  Level of Consciousness: awake, alert , oriented and patient cooperative  Airway & Oxygen Therapy: Patient Spontanous Breathing  Post-op Assessment: Report given to RN and Post -op Vital signs reviewed and stable  Post vital signs: Reviewed and stable  Last Vitals:  Vitals Value Taken Time  BP    Temp    Pulse    Resp    SpO2      Last Pain:  Vitals:   09/20/18 1103  TempSrc: Tympanic  PainSc: 0-No pain         Complications: No apparent anesthesia complications

## 2018-09-20 NOTE — Anesthesia Post-op Follow-up Note (Signed)
Anesthesia QCDR form completed.        

## 2018-09-21 ENCOUNTER — Encounter: Payer: Self-pay | Admitting: Ophthalmology

## 2019-09-24 IMAGING — CT CT ABD-PELV W/ CM
2 of 5 series · 15 of 46 positions shown, 17 images · IV contrast (APPLIED)
Comparison: CT the abdomen and pelvis 03/07/2018

CLINICAL DATA: 82-year-old female with pain in the lower abdomen
and emesis for the past several days.

EXAM:
CT ABDOMEN AND PELVIS WITH CONTRAST
TECHNIQUE: Multidetector CT imaging of the abdomen and pelvis was performed
using the standard protocol following bolus administration of
intravenous contrast.
CONTRAST:  100mL O5L9IA-UTT IOPAMIDOL (O5L9IA-UTT) INJECTION 61%

[Series 2: routine abd/pel with · axial · 0.68mm/px · z∈[-1031,-601]mm · 12 of 96 slices shown, 14 images]
[im 5/96  soft-tissue]
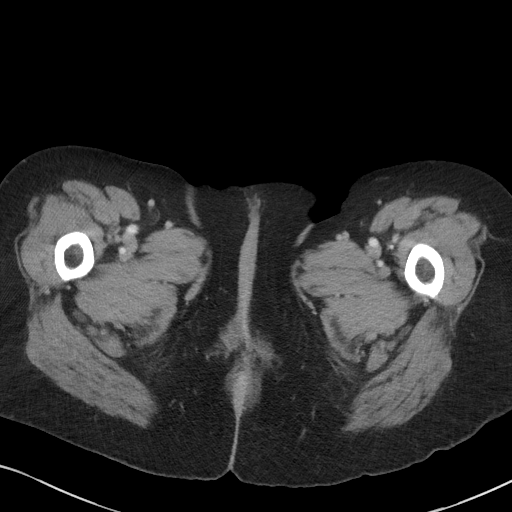
[im 5/96  bone]
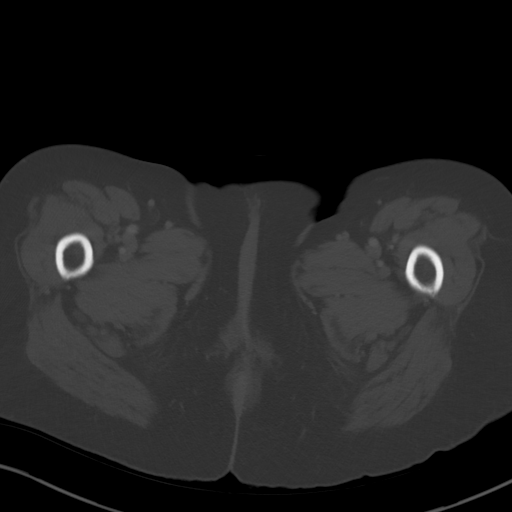
[im 15/96  soft-tissue]
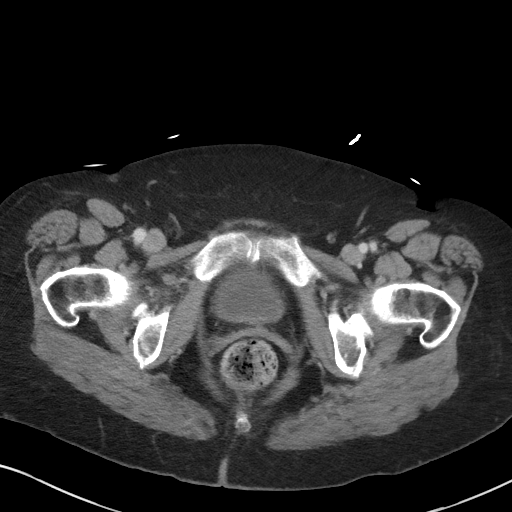
[im 20/96  soft-tissue]
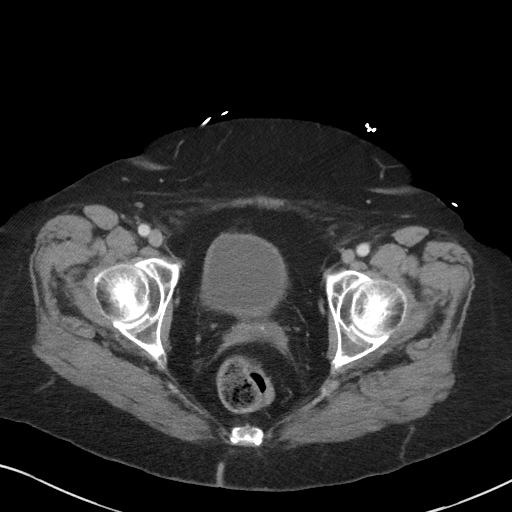
[im 29/96  soft-tissue]
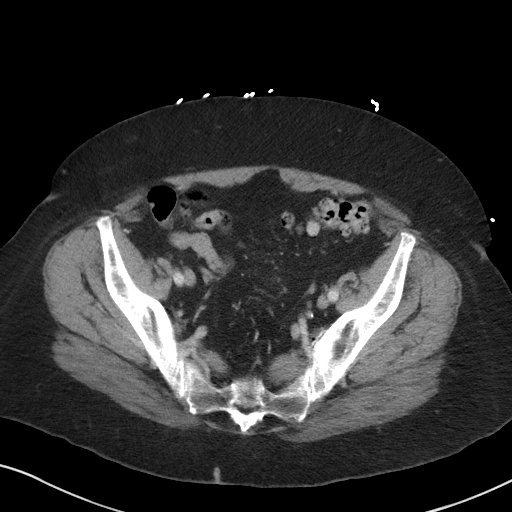
[im 39/96  soft-tissue]
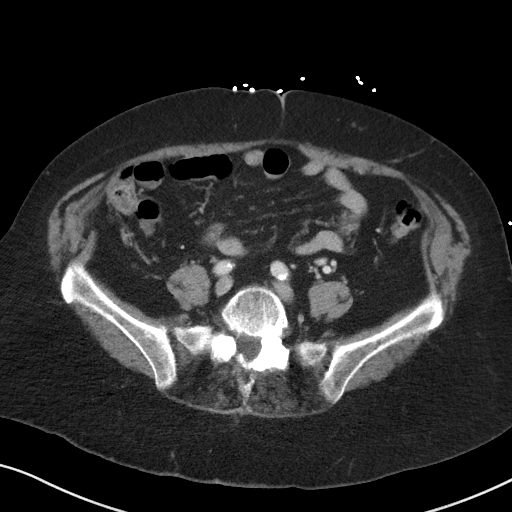
[im 43/96  soft-tissue]
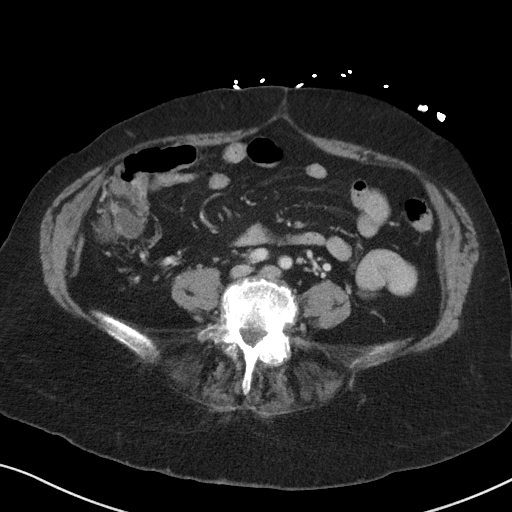
[im 53/96  soft-tissue]
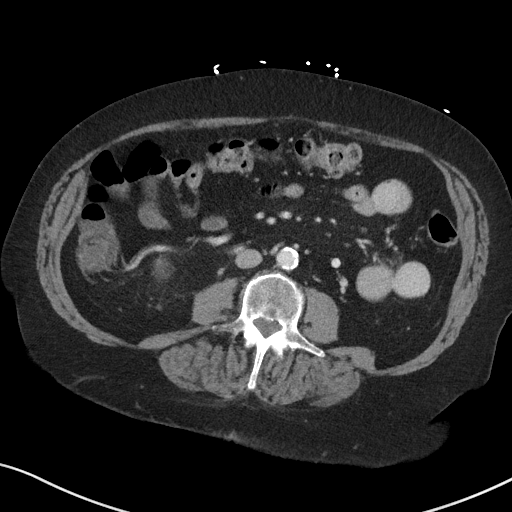
[im 58/96  soft-tissue]
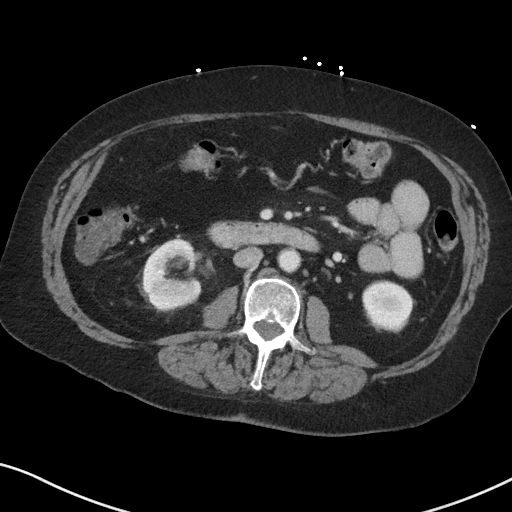
[im 67/96  soft-tissue]
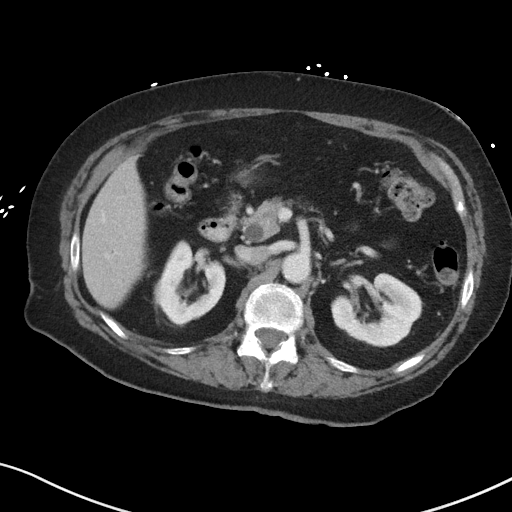
[im 67/96  bone]
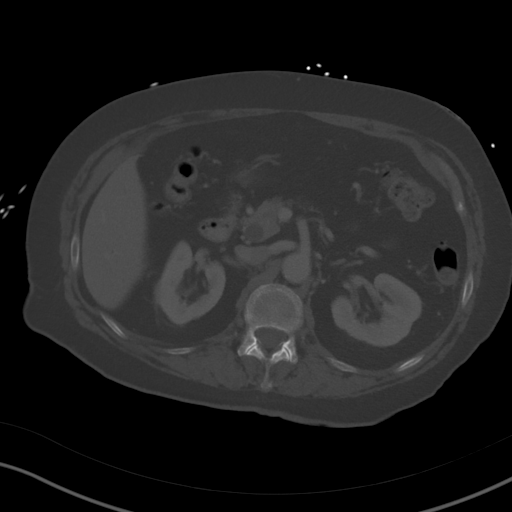
[im 77/96  soft-tissue]
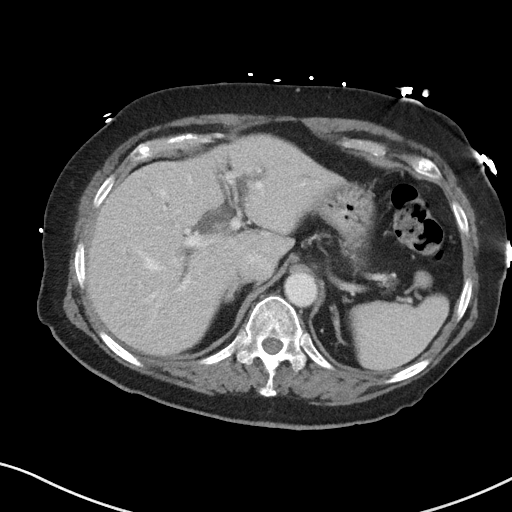
[im 81/96  soft-tissue]
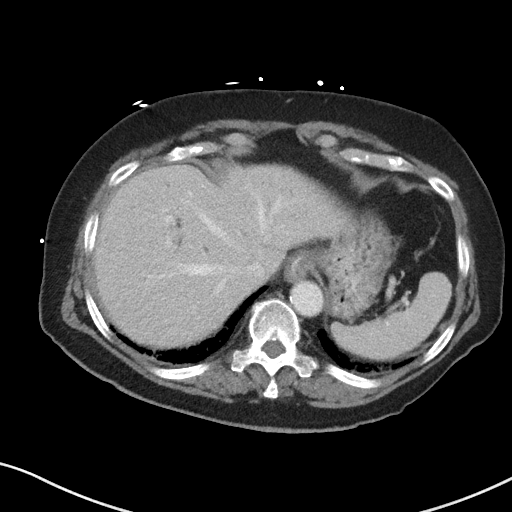
[im 91/96  soft-tissue]
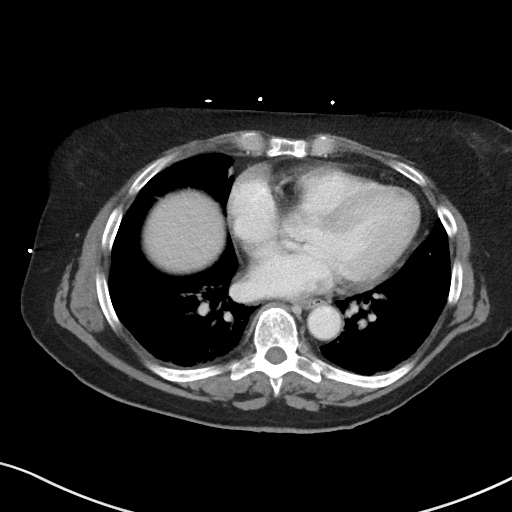

[Series 6: coronal st · coronal · 0.73mm/px · 3 of 100 slices shown]
[im 34/100  soft-tissue]
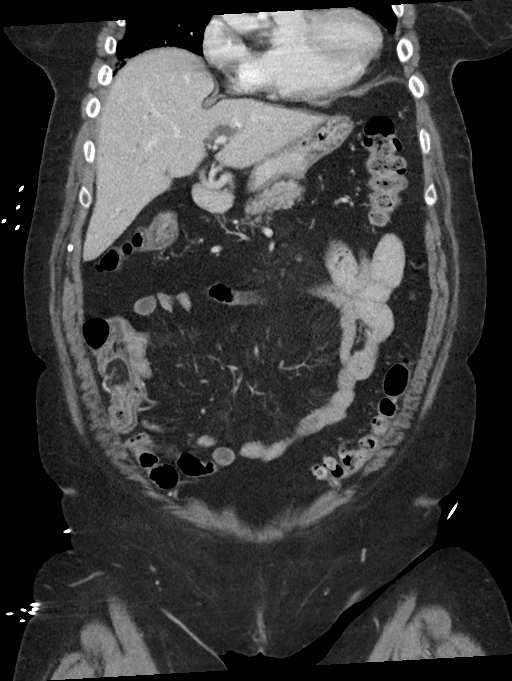
[im 45/100  soft-tissue]
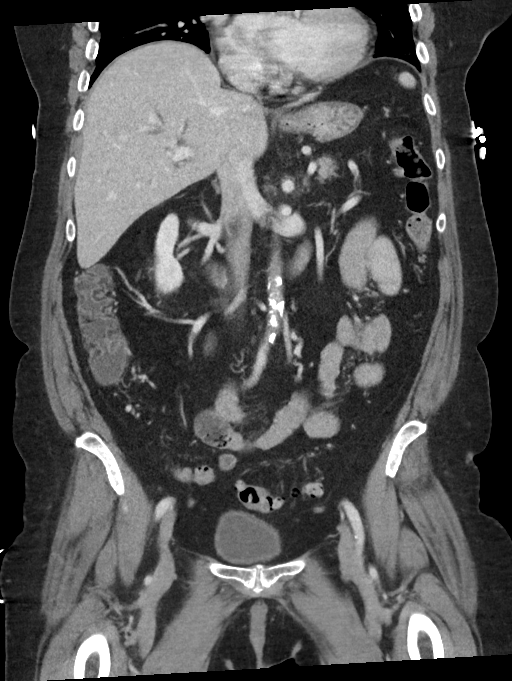
[im 56/100  soft-tissue]
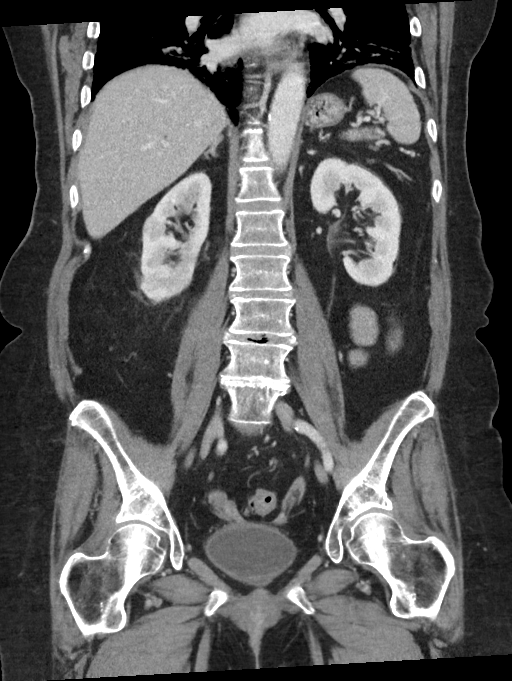

[15 of 46 positions shown; findings below may reference images not displayed]

FINDINGS: Lower chest: Areas of mild scarring in the lower lobes of the lungs
bilaterally.

Hepatobiliary: Subcentimeter low-attenuation lesions in segment 6 of
the liver, too small to characterize, but similar to the prior
study, statistically likely to represent tiny cysts. No other larger
more suspicious appearing cystic or solid hepatic lesions. Status
post cholecystectomy. Mild intrahepatic and moderate extrahepatic
biliary ductal dilatation, similar to the prior study, likely
reflective of post cholecystectomy physiology. No calcified stone in
the common bile duct.

Pancreas: No pancreatic mass. No pancreatic ductal dilatation. No
pancreatic or peripancreatic fluid or inflammatory changes.

Spleen: Unremarkable.

Adrenals/Urinary Tract: Subcentimeter low-attenuation lesion in the
posterior aspect of the interpolar region of the left kidney, too
small to characterize, but similar to the prior study and
statistically likely to represent a tiny cyst. Right kidney and
bilateral adrenal glands are normal in appearance. No
hydroureteronephrosis. Urinary bladder is normal in appearance.

Stomach/Bowel: Normal appearance of the stomach. No pathologic
dilatation of small bowel or colon. Numerous colonic diverticulae
are noted, without surrounding inflammatory changes to suggest an
acute diverticulitis at this time. The appendix is not confidently
identified and may be surgically absent. Regardless, there are no
inflammatory changes noted adjacent to the cecum to suggest the
presence of an acute appendicitis at this time.

Vascular/Lymphatic: Aortic atherosclerosis, without evidence of
aneurysm or dissection in the abdominal or pelvic vasculature. No
lymphadenopathy noted in the abdomen or pelvis.

Reproductive: Status post hysterectomy. Ovaries are unremarkable in
appearance.

Other: No significant volume of ascites.  No pneumoperitoneum.

Musculoskeletal: There are no aggressive appearing lytic or blastic
lesions noted in the visualized portions of the skeleton.
IMPRESSION: 1. No acute findings are noted in the abdomen or pelvis to account
for the patient's symptoms.
2. Persistent intra and extrahepatic biliary ductal dilatation, very
similar to the prior study, presumably reflective of benign post
cholecystectomy physiology. No calcified choledocholithiasis. If
there is any biochemical evidence or other clinical concern for
biliary tract obstruction, this could be further evaluated with MRI
of the abdomen with and without IV gadolinium with MRCP.
3. Colonic diverticulosis without evidence of acute diverticulitis
at this time.
4. Aortic atherosclerosis.

## 2020-03-04 ENCOUNTER — Emergency Department: Payer: Medicare Other

## 2020-03-04 ENCOUNTER — Encounter: Payer: Self-pay | Admitting: Emergency Medicine

## 2020-03-04 ENCOUNTER — Inpatient Hospital Stay
Admission: EM | Admit: 2020-03-04 | Discharge: 2020-03-08 | DRG: 392 | Disposition: A | Discharge status: Home / Self Care | Payer: Medicare Other | Attending: Internal Medicine | Admitting: Internal Medicine

## 2020-03-04 ENCOUNTER — Other Ambulatory Visit: Payer: Self-pay

## 2020-03-04 DIAGNOSIS — N390 Urinary tract infection, site not specified: Secondary | ICD-10-CM | POA: Diagnosis present

## 2020-03-04 DIAGNOSIS — K5792 Diverticulitis of intestine, part unspecified, without perforation or abscess without bleeding: Secondary | ICD-10-CM

## 2020-03-04 DIAGNOSIS — K5909 Other constipation: Secondary | ICD-10-CM | POA: Diagnosis present

## 2020-03-04 DIAGNOSIS — Z66 Do not resuscitate: Secondary | ICD-10-CM | POA: Diagnosis present

## 2020-03-04 DIAGNOSIS — Z961 Presence of intraocular lens: Secondary | ICD-10-CM | POA: Diagnosis present

## 2020-03-04 DIAGNOSIS — G4733 Obstructive sleep apnea (adult) (pediatric): Secondary | ICD-10-CM | POA: Diagnosis present

## 2020-03-04 DIAGNOSIS — Z79899 Other long term (current) drug therapy: Secondary | ICD-10-CM

## 2020-03-04 DIAGNOSIS — Z90710 Acquired absence of both cervix and uterus: Secondary | ICD-10-CM

## 2020-03-04 DIAGNOSIS — H9190 Unspecified hearing loss, unspecified ear: Secondary | ICD-10-CM | POA: Diagnosis present

## 2020-03-04 DIAGNOSIS — Z8249 Family history of ischemic heart disease and other diseases of the circulatory system: Secondary | ICD-10-CM

## 2020-03-04 DIAGNOSIS — Z9841 Cataract extraction status, right eye: Secondary | ICD-10-CM

## 2020-03-04 DIAGNOSIS — Z880 Allergy status to penicillin: Secondary | ICD-10-CM

## 2020-03-04 DIAGNOSIS — Z20822 Contact with and (suspected) exposure to covid-19: Secondary | ICD-10-CM | POA: Diagnosis present

## 2020-03-04 DIAGNOSIS — N3000 Acute cystitis without hematuria: Secondary | ICD-10-CM

## 2020-03-04 DIAGNOSIS — Z9842 Cataract extraction status, left eye: Secondary | ICD-10-CM

## 2020-03-04 DIAGNOSIS — K5732 Diverticulitis of large intestine without perforation or abscess without bleeding: Principal | ICD-10-CM | POA: Diagnosis present

## 2020-03-04 DIAGNOSIS — R600 Localized edema: Secondary | ICD-10-CM | POA: Diagnosis present

## 2020-03-04 DIAGNOSIS — R609 Edema, unspecified: Secondary | ICD-10-CM

## 2020-03-04 DIAGNOSIS — I10 Essential (primary) hypertension: Secondary | ICD-10-CM | POA: Diagnosis present

## 2020-03-04 DIAGNOSIS — Z9049 Acquired absence of other specified parts of digestive tract: Secondary | ICD-10-CM

## 2020-03-04 DIAGNOSIS — Z791 Long term (current) use of non-steroidal anti-inflammatories (NSAID): Secondary | ICD-10-CM

## 2020-03-04 DIAGNOSIS — Z882 Allergy status to sulfonamides status: Secondary | ICD-10-CM

## 2020-03-04 LAB — COMPREHENSIVE METABOLIC PANEL
ALT: 11 U/L (ref 0–44)
AST: 16 U/L (ref 15–41)
Albumin: 3.4 g/dL — ABNORMAL LOW (ref 3.5–5.0)
Alkaline Phosphatase: 89 U/L (ref 38–126)
Anion gap: 6 (ref 5–15)
BUN: 12 mg/dL (ref 8–23)
CO2: 25 mmol/L (ref 22–32)
Calcium: 8.4 mg/dL — ABNORMAL LOW (ref 8.9–10.3)
Chloride: 106 mmol/L (ref 98–111)
Creatinine, Ser: 0.8 mg/dL (ref 0.44–1.00)
GFR calc Af Amer: >60 mL/min (ref >60)
GFR calc non Af Amer: >60 mL/min (ref >60)
Glucose, Bld: 115 mg/dL — ABNORMAL HIGH (ref 70–99)
Potassium: 3.5 mmol/L (ref 3.5–5.1)
Sodium: 137 mmol/L (ref 135–145)
Total Bilirubin: 1.1 mg/dL (ref 0.3–1.2)
Total Protein: 7 g/dL (ref 6.5–8.1)

## 2020-03-04 LAB — URINALYSIS, COMPLETE (UACMP) WITH MICROSCOPIC
Bilirubin Urine: NEGATIVE
Glucose, UA: NEGATIVE mg/dL
Ketones, ur: NEGATIVE mg/dL
Nitrite: NEGATIVE
Protein, ur: NEGATIVE mg/dL
Specific Gravity, Urine: 1.016 (ref 1.005–1.030)
pH: 6 (ref 5.0–8.0)

## 2020-03-04 LAB — CBC
HCT: 46.5 % — ABNORMAL HIGH (ref 36.0–46.0)
Hemoglobin: 15.5 g/dL — ABNORMAL HIGH (ref 12.0–15.0)
MCH: 30.2 pg (ref 26.0–34.0)
MCHC: 33.3 g/dL (ref 30.0–36.0)
MCV: 90.5 fL (ref 80.0–100.0)
Platelets: 326 10*3/uL (ref 150–400)
RBC: 5.14 MIL/uL — ABNORMAL HIGH (ref 3.87–5.11)
RDW: 14.4 % (ref 11.5–15.5)
WBC: 11.3 10*3/uL — ABNORMAL HIGH (ref 4.0–10.5)
nRBC: 0 % (ref 0.0–0.2)

## 2020-03-04 LAB — LIPASE, BLOOD: Lipase: 22 U/L (ref 11–51)

## 2020-03-04 MED ORDER — MORPHINE SULFATE (PF) 4 MG/ML IV SOLN
4.0000 mg | Freq: Once | INTRAVENOUS | Status: AC
  Administered 2020-03-04: 4 mg via INTRAVENOUS
  Filled 2020-03-04: qty 1

## 2020-03-04 MED ORDER — SODIUM CHLORIDE 0.9 % IV BOLUS
1000.0000 mL | Freq: Once | INTRAVENOUS | Status: AC
  Administered 2020-03-04: 1000 mL via INTRAVENOUS

## 2020-03-04 MED ORDER — IOHEXOL 300 MG/ML  SOLN
100.0000 mL | Freq: Once | INTRAMUSCULAR | Status: AC | PRN
  Administered 2020-03-05: 100 mL via INTRAVENOUS

## 2020-03-04 MED ORDER — SODIUM CHLORIDE 0.9% FLUSH: 3.0000 mL | Freq: Once | INTRAVENOUS | Status: DC

## 2020-03-04 MED ORDER — ONDANSETRON HCL 4 MG/2ML IJ SOLN
4.0000 mg | Freq: Once | INTRAMUSCULAR | Status: AC
  Administered 2020-03-04: 4 mg via INTRAVENOUS
  Filled 2020-03-04: qty 2

## 2020-03-04 NOTE — ED Notes (Signed)
Pt reports bilateral lower abdominal and lower back pain for approx 3 months, with worsening over the past approx 2 days. Pt reports lower back pain worse on left side  Pt reports she is sometimes constipated, and sometimes has loose stools

## 2020-03-04 NOTE — ED Provider Notes (Signed)
[facility] Emergency Department Provider Note  ____________________________________________   First MD Initiated Contact with Patient 03/04/20 2258     (approximate)  I have reviewed the triage vital signs and the nursing notes.   HISTORY  Chief Complaint Abdominal Pain    HPI Marilyn Andrews is a 84 y.o. female  With PMHx HTN, depression, here with abdominal pain. Pt reports that she has had off an don abdominal pain for the past 2-3 weeks, but worse over the last 24-48 hours. She describes it as an aching, gnawing, LLQ and suprapubic pain. Pain is worse with movement, palpation. No alleviating factors. She has had some mild pain with urination as well. No known fevers, chills. No diarrhea or constipation, though she has had some off and on for the past few weeks. Pain is similar to her prior episodes of diverticulitis. No cough.     Past Medical History:  Diagnosis Date  . Anxiety   . Arthritis   . Depression   . Diverticulitis   . Dyspnea   . Edema    FEET/LEGS  . HOH (hard of hearing)   . Hypertension     Patient Active Problem List   Diagnosis Date Noted  . UTI (urinary tract infection) 07/28/2018  . Severe recurrent major depression without psychotic features (HCC) 03/08/2018  . Acute diverticulitis 03/07/2018  . Diverticulitis 07/01/2016    Past Surgical History:  Procedure Laterality Date  . ABDOMINAL HYSTERECTOMY    . APPENDECTOMY    . BACK SURGERY    . CATARACT EXTRACTION W/PHACO Right 08/30/2018   Procedure: CATARACT EXTRACTION PHACO AND INTRAOCULAR LENS PLACEMENT (IOC);  Surgeon: Galen Manila, MD;  Location: [facility] ORS;  Service: Ophthalmology;  Laterality: Right;  [place] 01:07.1 CDE 13.66 Fluid Pack Lot # [id number]  . CATARACT EXTRACTION W/PHACO Left 09/20/2018   Procedure: CATARACT EXTRACTION PHACO AND INTRAOCULAR LENS PLACEMENT (IOC);  Surgeon: Galen Manila, MD;  Location: [facility] ORS;  Service: Ophthalmology;  Laterality:  Left;  [place] 01:09 CDE 11.45 fluid pack lot # [id number]  . CHOLECYSTECTOMY    . HYSTERECTOMY ABDOMINAL WITH SALPINGECTOMY      Prior to Admission medications   Medication Sig Start Date End Date Taking? Authorizing Provider  acetaminophen (TYLENOL) 500 MG tablet Take 1,000 mg by mouth every 6 (six) hours as needed for mild pain or moderate pain.     [provider]  amLODipine (NORVASC) 5 MG tablet Take 5 mg by mouth daily.    [provider]  docusate sodium (COLACE) 100 MG capsule Take 100 mg by mouth See admin instructions. Take 100 mg daily, may take an additional 100 mg twice daily as needed for constipation    [provider]  escitalopram (LEXAPRO) 20 MG tablet Take 1 tablet (20 mg total) by mouth daily. 03/14/18   Katha Hamming, MD  fluticasone (FLONASE) 50 MCG/ACT nasal spray Place 2 sprays into both nostrils 2 (two) times daily as needed for allergies or rhinitis.     [provider]  ibuprofen (ADVIL,MOTRIN) 200 MG tablet Take 800 mg by mouth every 6 (six) hours as needed for headache or moderate pain.    [provider]  ondansetron (ZOFRAN ODT) 4 MG disintegrating tablet Take 1 tablet (4 mg total) by mouth every 8 (eight) hours as needed for nausea or vomiting. 07/29/18   Auburn Bilberry, MD  ranitidine (ZANTAC) 150 MG tablet Take 150 mg by mouth 3 (three) times daily as needed for heartburn.  [provider]  triamcinolone cream (KENALOG) 0.1 % Apply 1 application topically daily as needed (rash).     [provider]    Allergies Penicillins and Sulfa antibiotics  Family History  Problem Relation Age of Onset  . Hypertension Father   . CAD Brother     Social History Social History   Tobacco Use  . Smoking status: Never Smoker  . Smokeless tobacco: Never Used  Substance Use Topics  . Alcohol use: No  . Drug use: No    Review of Systems  Review of Systems  Constitutional: Positive for fatigue.  Negative for fever.  HENT: Negative for congestion and sore throat.   Eyes: Negative for visual disturbance.  Respiratory: Negative for cough and shortness of breath.   Cardiovascular: Negative for chest pain.  Gastrointestinal: Positive for abdominal pain and nausea. Negative for diarrhea and vomiting.  Genitourinary: Positive for dysuria and frequency. Negative for flank pain.  Musculoskeletal: Negative for back pain and neck pain.  Skin: Negative for rash and wound.  Neurological: Positive for weakness.  All other systems reviewed and are negative.    ____________________________________________  PHYSICAL EXAM:      VITAL SIGNS: ED Triage Vitals  Enc Vitals Group     BP 03/04/20 1853 (!) 162/65     Pulse Rate 03/04/20 1853 89     Resp 03/04/20 1853 18     Temp 03/04/20 1853 98.8 F (37.1 C)     Temp Source 03/04/20 1853 Oral     SpO2 03/04/20 1853 97 %     Weight 03/04/20 1854 164 lb 3.9 oz (74.5 kg)     Height 03/04/20 1854 5' (1.524 m)     Head Circumference --      Peak Flow --      Pain Score 03/04/20 1853 10     Pain Loc --      Pain Edu? --      Excl. in GC? --      Physical Exam Vitals and nursing note reviewed.  Constitutional:      General: She is not in acute distress.    Appearance: She is well-developed.  HENT:     Head: Normocephalic and atraumatic.  Eyes:     Conjunctiva/sclera: Conjunctivae normal.  Cardiovascular:     Rate and Rhythm: Normal rate and regular rhythm.     Heart sounds: Normal heart sounds. No murmur. No friction rub.  Pulmonary:     Effort: Pulmonary effort is normal. No respiratory distress.     Breath sounds: Normal breath sounds. No wheezing or rales.  Abdominal:     General: There is no distension.     Palpations: Abdomen is soft.     Tenderness: There is abdominal tenderness in the periumbilical area and left lower quadrant. There is guarding. There is no rebound.  Musculoskeletal:     Cervical back: Neck supple.   Skin:    General: Skin is warm.     Capillary Refill: Capillary refill takes less than 2 seconds.  Neurological:     Mental Status: She is alert and oriented to person, place, and time.     Motor: No abnormal muscle tone.       ____________________________________________   LABS (all labs ordered are listed, but only abnormal results are displayed)  Labs Reviewed  COMPREHENSIVE METABOLIC PANEL - Abnormal; Notable for the following components:      Result Value   Glucose, Bld 115 (*)  Calcium 8.4 (*)    Albumin 3.4 (*)    All other components within normal limits  CBC - Abnormal; Notable for the following components:   WBC 11.3 (*)    RBC 5.14 (*)    Hemoglobin 15.5 (*)    HCT 46.5 (*)    All other components within normal limits  URINALYSIS, COMPLETE (UACMP) WITH MICROSCOPIC - Abnormal; Notable for the following components:   Color, Urine YELLOW (*)    APPearance CLOUDY (*)    Hgb urine dipstick SMALL (*)    Leukocytes,Ua LARGE (*)    Bacteria, UA MANY (*)    All other components within normal limits  URINE CULTURE  RESPIRATORY PANEL BY RT PCR (FLU A&B, COVID)  LIPASE, BLOOD  LACTIC ACID, PLASMA  LACTIC ACID, PLASMA    ____________________________________________  EKG: Normal sinus rhythm, ventricular at 79.  QRS 95, QTc 470.  No acute ST elevations or depressions.  No evidence of acute ischemia or infarct ________________________________________  RADIOLOGY All imaging, including plain films, CT scans, and ultrasounds, independently reviewed by me, and interpretations confirmed via formal radiology reads.  ED MD interpretation:   CT A/P: Acute, uncomplicated diverticulitis CXR: Clear  Official radiology report(s): DG Chest 2 View  Result Date: 03/05/2020 CLINICAL DATA:  84 year old female with shortness of breath. EXAM: CHEST - 2 VIEW COMPARISON:  Chest radiograph dated 02/28/2016. FINDINGS: No focal consolidation, pleural effusion, pneumothorax. Mild  bilateral peripheral and subpleural interstitial coarsening and nodularity, possibly chronic. Acute atypical pneumonia is less likely but not excluded. Clinical correlation is recommended. Stable cardiac silhouette. No acute osseous pathology. IMPRESSION: No focal consolidation. Bilateral peripheral and subpleural interstitial coarsening and nodularity, possibly chronic. Electronically Signed   By: Elgie Collard M.D.   On: 03/05/2020 00:19   CT ABDOMEN PELVIS W CONTRAST  Result Date: 03/05/2020 CLINICAL DATA:  Nausea, vomiting, lower abdominal and lower back pain for approximately 3 months EXAM: CT ABDOMEN AND PELVIS WITH CONTRAST TECHNIQUE: Multidetector CT imaging of the abdomen and pelvis was performed using the standard protocol following bolus administration of intravenous contrast. CONTRAST:  OMNIPAQUE IOHEXOL 300 MG/ML  SOLN COMPARISON:  CT abdomen pelvis 07/28/2018 FINDINGS: Lower chest: Bandlike opacity in the left lung base likely reflecting atelectasis or scarring. More fine reticular basilar opacities with some subpleural sparing could reflect developing interstitial fibrosis. No consolidation or effusion. Cardiac size at the upper limits of normal. No pericardial effusion. Hepatobiliary: Multiple subcentimeter hypertension foci throughout the liver too small to fully characterize on CT imaging but statistically likely benign. No worrisome focal liver lesions. Smooth liver surface contour. Normal hepatic attenuation. Gallbladder appears to be surgically absent. There is prominence of the biliary tree with the extrahepatic bile duct measuring up to 17 mm which is somewhat greater than expected for age and reservoir effect but is unchanged from comparison exams. No visible calcified gallstones. Pancreas: Mild pancreatic atrophy. No peripancreatic inflammation or ductal dilatation. Spleen: Normal in size without focal abnormality. Small accessory splenule seen anteriorly. Adrenals/Urinary Tract:  Normal adrenal glands. Kidneys are normally located with symmetric enhancement and excretion. No suspicious renal lesion, urolithiasis or hydronephrosis. Urinary bladder is largely decompressed at the time of exam and therefore poorly evaluated by CT imaging. Some mild perivesicular hazy stranding is noted though may be reactive. Stomach/Bowel: Distal esophagus, stomach and duodenal sweep are unremarkable. No small bowel wall thickening or dilatation. No evidence of obstruction. The appendix is surgically absent. Proximal colon is unremarkable. There are scattered distal colonic diverticula  with a focally thickened segment of distal sigmoid with pericolonic stranding centered upon a culprit diverticulum (2/67). No extraluminal fluid, gas, organized collection or abscess is seen. Vascular/Lymphatic: Atherosclerotic plaque throughout the abdominal aorta and branch vessels. No acute vascular anomaly. Few reactive nodes in the low abdomen. No pathologically enlarged abdominopelvic nodes. Reproductive: Uterus is surgically absent. No concerning adnexal lesions. Other: Pericolonic stranding as above. No abdominopelvic free fluid or air. No bowel containing hernias. Small fat containing umbilical hernia. Musculoskeletal: Multilevel degenerative changes are present in the imaged portions of the spine. Mild levocurvature of the lumbar spine. Additional degenerative changes in the hips and pelvis. The osseous structures appear diffusely demineralized which may limit detection of small or nondisplaced fractures. No acute osseous abnormality or suspicious osseous lesion. IMPRESSION: 1. Findings compatible with acute uncomplicated diverticulitis of the distal sigmoid colon. No evidence of perforation or abscess formation. 2. Mild perivesicular hazy stranding is noted though may be reactive. Recommend correlation with urinalysis to exclude the possibility of cystitis. 3. Stable prominence of the biliary tree with the extrahepatic  bile duct measuring up to 17 mm which is somewhat greater than expected for age and reservoir effect but is unchanged from comparison exams. 4. Aortic Atherosclerosis (ICD10-I70.0). Electronically Signed   By: Kreg ShropshirePrice  DeHay M.D.   On: 03/05/2020 00:34    ____________________________________________  PROCEDURES   Procedure(s) performed (including Critical Care):  .1-3 Lead EKG Interpretation Performed by: Shaune PollackIsaacs, Zachry Hopfensperger, MD Authorized by: Shaune PollackIsaacs, Jullian Clayson, MD     Interpretation: normal     ECG rate:  80-90   ECG rate assessment: normal     Rhythm: sinus rhythm     Ectopy: none     Conduction: normal   Comments:     Indication: Abdominal pain, possible sepsis, elderly receiving pain medications    ____________________________________________  INITIAL IMPRESSION / MDM / ASSESSMENT AND PLAN / ED COURSE  As part of my medical decision making, I reviewed the following data within the electronic MEDICAL RECORD NUMBER Nursing notes reviewed and incorporated, Old chart reviewed, Notes from prior ED visits, and  Controlled Substance Database       *Marilyn Andrews was evaluated in Emergency Department on 03/05/2020 for the symptoms described in the history of present illness. She was evaluated in the context of the global COVID-19 pandemic, which necessitated consideration that the patient might be at risk for infection with the SARS-CoV-2 virus that causes COVID-19. Institutional protocols and algorithms that pertain to the evaluation of patients at risk for COVID-19 are in a state of rapid change based on information released by regulatory bodies including the CDC and federal and state organizations. These policies and algorithms were followed during the patient's care in the ED.  Some ED evaluations and interventions may be delayed as a result of limited staffing during the pandemic.*     Medical Decision Making:  84 yo F here with abdominal pain, nausea, vomiting, inability to eat/drink. On  arrival, she appears to feel unwell but is non-toxic. Abdomen soft. Labs reassuring but UA + UTI and CT scan shows acute diverticulitis. She has ongoing pain despite fluids, nausea meds, and morphine here. Lives alone. Given age, leukocytosis w/ divertuclitis and UTI, weakness and nausea, will admit.  ____________________________________________  FINAL CLINICAL IMPRESSION(S) / ED DIAGNOSES  Final diagnoses:  Acute diverticulitis  Acute cystitis without hematuria     MEDICATIONS GIVEN DURING THIS VISIT:  Medications  sodium chloride flush (NS) 0.9 % injection 3 mL (3 mLs Intravenous  Not Given 03/04/20 2213)  cefTRIAXone (ROCEPHIN) 2 g in sodium chloride 0.9 % 100 mL IVPB (has no administration in time range)  metroNIDAZOLE (FLAGYL) IVPB 500 mg (has no administration in time range)  lactated ringers infusion (has no administration in time range)  sodium chloride 0.9 % bolus 1,000 mL (1,000 mLs Intravenous New Bag/Given 03/04/20 2344)  ondansetron (ZOFRAN) injection 4 mg (4 mg Intravenous Given 03/04/20 2329)  morphine 4 MG/ML injection 4 mg (4 mg Intravenous Given 03/04/20 2330)  iohexol (OMNIPAQUE) 300 MG/ML solution 100 mL (100 mLs Intravenous Contrast Given 03/05/20 0013)  ketorolac (TORADOL) 30 MG/ML injection 15 mg (15 mg Intravenous Given 03/05/20 0100)  morphine 4 MG/ML injection 4 mg (4 mg Intravenous Given 03/05/20 0105)     ED Discharge Orders    None       Note:  This document was prepared using Dragon voice recognition software and may include unintentional dictation errors.   Duffy Bruce, MD 03/05/20 [phone]

## 2020-03-04 NOTE — ED Triage Notes (Addendum)
Pt here via EMS from home with c/o lower abdominal pain, states hx of diverticulitis; had loose stool x1 last pm, denies vomiting. Having "spasm" like abd pain where she grabs her stomach and "cries out" in pain.

## 2020-03-04 NOTE — ED Notes (Signed)
Pt slightly hard of hearing

## 2020-03-05 ENCOUNTER — Emergency Department: Payer: Medicare Other

## 2020-03-05 ENCOUNTER — Other Ambulatory Visit: Payer: Self-pay

## 2020-03-05 DIAGNOSIS — I10 Essential (primary) hypertension: Secondary | ICD-10-CM | POA: Diagnosis present

## 2020-03-05 DIAGNOSIS — Z791 Long term (current) use of non-steroidal anti-inflammatories (NSAID): Secondary | ICD-10-CM | POA: Diagnosis not present

## 2020-03-05 DIAGNOSIS — Z8249 Family history of ischemic heart disease and other diseases of the circulatory system: Secondary | ICD-10-CM | POA: Diagnosis not present

## 2020-03-05 DIAGNOSIS — K5792 Diverticulitis of intestine, part unspecified, without perforation or abscess without bleeding: Secondary | ICD-10-CM | POA: Diagnosis not present

## 2020-03-05 DIAGNOSIS — Z90710 Acquired absence of both cervix and uterus: Secondary | ICD-10-CM | POA: Diagnosis not present

## 2020-03-05 DIAGNOSIS — Z882 Allergy status to sulfonamides status: Secondary | ICD-10-CM | POA: Diagnosis not present

## 2020-03-05 DIAGNOSIS — K5909 Other constipation: Secondary | ICD-10-CM | POA: Diagnosis present

## 2020-03-05 DIAGNOSIS — Z880 Allergy status to penicillin: Secondary | ICD-10-CM | POA: Diagnosis not present

## 2020-03-05 DIAGNOSIS — Z20822 Contact with and (suspected) exposure to covid-19: Secondary | ICD-10-CM | POA: Diagnosis present

## 2020-03-05 DIAGNOSIS — Z79899 Other long term (current) drug therapy: Secondary | ICD-10-CM | POA: Diagnosis not present

## 2020-03-05 DIAGNOSIS — Z961 Presence of intraocular lens: Secondary | ICD-10-CM | POA: Diagnosis present

## 2020-03-05 DIAGNOSIS — G4733 Obstructive sleep apnea (adult) (pediatric): Secondary | ICD-10-CM | POA: Diagnosis present

## 2020-03-05 DIAGNOSIS — N3000 Acute cystitis without hematuria: Secondary | ICD-10-CM | POA: Diagnosis present

## 2020-03-05 DIAGNOSIS — Z9842 Cataract extraction status, left eye: Secondary | ICD-10-CM | POA: Diagnosis not present

## 2020-03-05 DIAGNOSIS — Z9841 Cataract extraction status, right eye: Secondary | ICD-10-CM | POA: Diagnosis not present

## 2020-03-05 DIAGNOSIS — H9190 Unspecified hearing loss, unspecified ear: Secondary | ICD-10-CM | POA: Diagnosis present

## 2020-03-05 DIAGNOSIS — R600 Localized edema: Secondary | ICD-10-CM | POA: Diagnosis present

## 2020-03-05 DIAGNOSIS — Z66 Do not resuscitate: Secondary | ICD-10-CM | POA: Diagnosis present

## 2020-03-05 DIAGNOSIS — K5732 Diverticulitis of large intestine without perforation or abscess without bleeding: Secondary | ICD-10-CM | POA: Diagnosis present

## 2020-03-05 DIAGNOSIS — Z9049 Acquired absence of other specified parts of digestive tract: Secondary | ICD-10-CM | POA: Diagnosis not present

## 2020-03-05 LAB — BASIC METABOLIC PANEL
Anion gap: 6 (ref 5–15)
BUN: 12 mg/dL (ref 8–23)
CO2: 22 mmol/L (ref 22–32)
Calcium: 7.6 mg/dL — ABNORMAL LOW (ref 8.9–10.3)
Chloride: 109 mmol/L (ref 98–111)
Creatinine, Ser: 0.88 mg/dL (ref 0.44–1.00)
GFR calc Af Amer: >60 mL/min (ref >60)
GFR calc non Af Amer: >60 mL/min (ref >60)
Glucose, Bld: 150 mg/dL — ABNORMAL HIGH (ref 70–99)
Potassium: 3.5 mmol/L (ref 3.5–5.1)
Sodium: 137 mmol/L (ref 135–145)

## 2020-03-05 LAB — CBC
HCT: 41.3 % (ref 36.0–46.0)
Hemoglobin: 13.8 g/dL (ref 12.0–15.0)
MCH: 30.7 pg (ref 26.0–34.0)
MCHC: 33.4 g/dL (ref 30.0–36.0)
MCV: 91.8 fL (ref 80.0–100.0)
Platelets: 290 10*3/uL (ref 150–400)
RBC: 4.5 MIL/uL (ref 3.87–5.11)
RDW: 14.5 % (ref 11.5–15.5)
WBC: 10.5 10*3/uL (ref 4.0–10.5)
nRBC: 0 % (ref 0.0–0.2)

## 2020-03-05 LAB — URINE CULTURE: Culture: <10000 — AB

## 2020-03-05 LAB — GLUCOSE, CAPILLARY: Glucose-Capillary: 101 mg/dL — ABNORMAL HIGH (ref 70–99)

## 2020-03-05 LAB — RESPIRATORY PANEL BY RT PCR (FLU A&B, COVID)
Influenza A by PCR: NEGATIVE
Influenza B by PCR: NEGATIVE
SARS Coronavirus 2 by RT PCR: NEGATIVE

## 2020-03-05 LAB — LACTIC ACID, PLASMA: Lactic Acid, Venous: 1 mmol/L (ref 0.5–1.9)

## 2020-03-05 MED ORDER — SODIUM CHLORIDE 0.9 % IV SOLN
3.0000 g | Freq: Four times a day (QID) | INTRAVENOUS | Status: DC
  Administered 2020-03-05 – 2020-03-07 (×8): 3 g via INTRAVENOUS
  Filled 2020-03-05 (×2): qty 8
  Filled 2020-03-05: qty 3
  Filled 2020-03-05: qty 8
  Filled 2020-03-05 (×2): qty 3
  Filled 2020-03-05: qty 8
  Filled 2020-03-05: qty 3
  Filled 2020-03-05: qty 8
  Filled 2020-03-05: qty 3
  Filled 2020-03-05: qty 8
  Filled 2020-03-05 (×3): qty 3

## 2020-03-05 MED ORDER — ACETAMINOPHEN 650 MG RE SUPP: 650.0000 mg | Freq: Four times a day (QID) | RECTAL | Status: DC | PRN

## 2020-03-05 MED ORDER — KETOROLAC TROMETHAMINE 30 MG/ML IJ SOLN
15.0000 mg | Freq: Once | INTRAMUSCULAR | Status: AC
  Administered 2020-03-05: 15 mg via INTRAVENOUS
  Filled 2020-03-05: qty 1

## 2020-03-05 MED ORDER — SODIUM CHLORIDE 0.9 % IV SOLN
2.0000 g | Freq: Once | INTRAVENOUS | Status: AC
  Administered 2020-03-05: 2 g via INTRAVENOUS
  Filled 2020-03-05: qty 20

## 2020-03-05 MED ORDER — SODIUM CHLORIDE 0.9 % IV SOLN
2.0000 g | INTRAVENOUS | Status: DC
  Filled 2020-03-05: qty 20

## 2020-03-05 MED ORDER — METRONIDAZOLE IN NACL 5-0.79 MG/ML-% IV SOLN
500.0000 mg | Freq: Three times a day (TID) | INTRAVENOUS | Status: DC
  Administered 2020-03-05: 500 mg via INTRAVENOUS
  Filled 2020-03-05 (×4): qty 100

## 2020-03-05 MED ORDER — LEVOFLOXACIN IN D5W 500 MG/100ML IV SOLN
500.0000 mg | INTRAVENOUS | Status: DC
  Administered 2020-03-05: 500 mg via INTRAVENOUS
  Filled 2020-03-05 (×2): qty 100

## 2020-03-05 MED ORDER — MIRTAZAPINE 15 MG PO TBDP
15.0000 mg | ORAL_TABLET | Freq: Every day | ORAL | Status: DC
  Administered 2020-03-05 – 2020-03-07 (×3): 15 mg via ORAL
  Filled 2020-03-05 (×4): qty 1

## 2020-03-05 MED ORDER — LACTATED RINGERS IV SOLN: INTRAVENOUS | Status: DC

## 2020-03-05 MED ORDER — ONDANSETRON HCL 4 MG PO TABS
4.0000 mg | ORAL_TABLET | Freq: Four times a day (QID) | ORAL | Status: DC | PRN
  Administered 2020-03-05 – 2020-03-08 (×2): 4 mg via ORAL
  Filled 2020-03-05 (×2): qty 1

## 2020-03-05 MED ORDER — METRONIDAZOLE IN NACL 5-0.79 MG/ML-% IV SOLN
500.0000 mg | Freq: Once | INTRAVENOUS | Status: AC
  Administered 2020-03-05: 500 mg via INTRAVENOUS
  Filled 2020-03-05: qty 100

## 2020-03-05 MED ORDER — AMLODIPINE BESYLATE 5 MG PO TABS
5.0000 mg | ORAL_TABLET | Freq: Every day | ORAL | Status: DC
  Administered 2020-03-05 – 2020-03-08 (×4): 5 mg via ORAL
  Filled 2020-03-05 (×4): qty 1

## 2020-03-05 MED ORDER — ENOXAPARIN SODIUM 40 MG/0.4ML ~~LOC~~ SOLN
40.0000 mg | SUBCUTANEOUS | Status: DC
  Administered 2020-03-05 – 2020-03-08 (×4): 40 mg via SUBCUTANEOUS
  Filled 2020-03-05 (×4): qty 0.4

## 2020-03-05 MED ORDER — MORPHINE SULFATE (PF) 4 MG/ML IV SOLN
4.0000 mg | Freq: Once | INTRAVENOUS | Status: AC
  Administered 2020-03-05: 4 mg via INTRAVENOUS
  Filled 2020-03-05: qty 1

## 2020-03-05 MED ORDER — PANTOPRAZOLE SODIUM 40 MG IV SOLR
40.0000 mg | INTRAVENOUS | Status: DC
  Administered 2020-03-05 – 2020-03-08 (×4): 40 mg via INTRAVENOUS
  Filled 2020-03-05 (×4): qty 40

## 2020-03-05 MED ORDER — ACETAMINOPHEN 325 MG PO TABS
650.0000 mg | ORAL_TABLET | Freq: Four times a day (QID) | ORAL | Status: DC | PRN
  Administered 2020-03-05 – 2020-03-08 (×2): 650 mg via ORAL
  Filled 2020-03-05 (×2): qty 2

## 2020-03-05 MED ORDER — SODIUM CHLORIDE 0.9 % IV SOLN: INTRAVENOUS | Status: DC

## 2020-03-05 MED ORDER — PROMETHAZINE HCL 25 MG/ML IJ SOLN
6.2500 mg | Freq: Once | INTRAMUSCULAR | Status: AC
  Administered 2020-03-05: 6.25 mg via INTRAVENOUS
  Filled 2020-03-05: qty 1

## 2020-03-05 MED ORDER — SODIUM CHLORIDE 0.9 % IV SOLN
INTRAVENOUS | Status: DC | PRN
  Administered 2020-03-05: 250 mL via INTRAVENOUS

## 2020-03-05 MED ORDER — MORPHINE SULFATE (PF) 2 MG/ML IV SOLN
2.0000 mg | INTRAVENOUS | Status: DC | PRN
  Administered 2020-03-05 – 2020-03-08 (×14): 2 mg via INTRAVENOUS
  Filled 2020-03-05 (×15): qty 1

## 2020-03-05 MED ORDER — ESCITALOPRAM OXALATE 10 MG PO TABS
20.0000 mg | ORAL_TABLET | Freq: Every day | ORAL | Status: DC
  Administered 2020-03-05 – 2020-03-08 (×4): 20 mg via ORAL
  Filled 2020-03-05 (×4): qty 2

## 2020-03-05 MED ORDER — ONDANSETRON HCL 4 MG/2ML IJ SOLN
4.0000 mg | Freq: Four times a day (QID) | INTRAMUSCULAR | Status: DC | PRN
  Administered 2020-03-05 – 2020-03-07 (×4): 4 mg via INTRAVENOUS
  Filled 2020-03-05 (×4): qty 2

## 2020-03-05 MED ORDER — FLUTICASONE PROPIONATE 50 MCG/ACT NA SUSP
2.0000 | Freq: Two times a day (BID) | NASAL | Status: DC | PRN
  Filled 2020-03-05: qty 16

## 2020-03-05 NOTE — Consult Note (Signed)
Pharmacy Antibiotic Note  Marilyn Andrews is a 84 y.o. female admitted on 03/04/2020 with diverticulitis.  Pharmacy has been consulted for ampicillin/sulbactam dosing. Patient does have an allergy to pencillin documented- per chart, occurred as a child and patient does not recall reaction. Patient has received piperacillin/tazobactam in the past.    Plan: Ampicillin/sulbactam 3 g q6h  Height: 5' (152.4 cm) Weight: 74.5 kg (164 lb 3.9 oz) IBW/kg (Calculated) : 45.5  Temp (24hrs), Avg:98.5 F (36.9 C), Min:98.3 F (36.8 C), Max:98.8 F (37.1 C)  Recent Labs  Lab 03/04/20 1900 03/04/20 2348 03/05/20 0543  WBC 11.3*  --  10.5  CREATININE 0.80  --  0.88  LATICACIDVEN  --  1.0  --     Estimated Creatinine Clearance: 44.4 mL/min (by C-G formula based on SCr of 0.88 mg/dL).    Allergies  Allergen Reactions  . Penicillins Other (See Comments)    Reaction as a child, patient does not recall  Has patient had a PCN reaction causing immediate rash, facial/tongue/throat swelling, SOB or lightheadedness with hypotension: unknown Has patient had a PCN reaction causing severe rash involving mucus membranes or skin necrosis: unknown Has patient had a PCN reaction that required hospitalization: unknown Has patient had a PCN reaction occurring within the last 10 years: No If all of the above answers are "NO", then may proceed with Cephalosporin use.  . Sulfa Antibiotics Nausea Only    Antimicrobials this admission: 5/4 Ceftriaxone 2 g x 1 5/4 Levofloxacin 500 mg x 1 5/4 Metronidazole 500 mg x 2  Ampicillin/sulbactam 5/4 >>   Dose adjustments this admission: n/a  Microbiology results:   Thank you for allowing pharmacy to be a part of this patient's care.  Tressie Ellis  Pharmacy Resident 03/05/2020 4:33 PM

## 2020-03-05 NOTE — H&P (Signed)
History and Physical    Marilyn Andrews BJY:[id number] DOB: 29-Apr-1936 DOA: 03/04/2020  PCP: [facility], [facility]   Patient coming from: Home  I have personally briefly reviewed patient's old medical records in [facility] Link  Chief Complaint: Abdominal pain  HPI: Marilyn Andrews is a 84 y.o. female with medical history significant for hypertension and depression who presents to the emergency room with a 2 to 3-week history of intermittent left lower quadrant and suprapubic pain that became acutely worse in the past 24 to 48 hours.  The pain is severe and achy with no alleviating factors, aggravated by movement and palpation.  She denies vomiting or change in bowel habits.  Associated with mild dysuria.  Has no fever or chills, chest pain or shortness of breath  ED Course: In the ER, she was afebrile heart rate 89 BP 162/55.  WBC 11,000 with otherwise unremarkable blood work.  Urinalysis was consistent with UTI with large leukocyte esterase cloudy appearance.  CT abdomen and pelvis shows findings compatible with acute uncomplicated diverticulitis.  Patient was given a dose of Rocephin and metronidazole.  Hospitalist consulted for admission.  Review of Systems: As per HPI otherwise 10 point review of systems negative.    Past Medical History:  Diagnosis Date  . Anxiety   . Arthritis   . Depression   . Diverticulitis   . Dyspnea   . Edema    FEET/LEGS  . HOH (hard of hearing)   . Hypertension     Past Surgical History:  Procedure Laterality Date  . ABDOMINAL HYSTERECTOMY    . APPENDECTOMY    . BACK SURGERY    . CATARACT EXTRACTION W/PHACO Right 08/30/2018   Procedure: CATARACT EXTRACTION PHACO AND INTRAOCULAR LENS PLACEMENT (IOC);  Surgeon: Galen Manila, MD;  Location: [facility] ORS;  Service: Ophthalmology;  Laterality: Right;  [place] 01:07.1 CDE 13.66 Fluid Pack Lot # [id number]  . CATARACT EXTRACTION W/PHACO Left 09/20/2018   Procedure: CATARACT EXTRACTION PHACO AND  INTRAOCULAR LENS PLACEMENT (IOC);  Surgeon: Galen Manila, MD;  Location: [facility] ORS;  Service: Ophthalmology;  Laterality: Left;  [place] 01:09 CDE 11.45 fluid pack lot # [id number]  . CHOLECYSTECTOMY    . HYSTERECTOMY ABDOMINAL WITH SALPINGECTOMY       reports that she has never smoked. She has never used smokeless tobacco. She reports that she does not drink alcohol or use drugs.  Allergies  Allergen Reactions  . Penicillins Other (See Comments)    Reaction as a child, patient does not recall  Has patient had a PCN reaction causing immediate rash, facial/tongue/throat swelling, SOB or lightheadedness with hypotension: unknown Has patient had a PCN reaction causing severe rash involving mucus membranes or skin necrosis: unknown Has patient had a PCN reaction that required hospitalization: unknown Has patient had a PCN reaction occurring within the last 10 years: No If all of the above answers are "NO", then may proceed with Cephalosporin use.  . Sulfa Antibiotics Nausea Only    Family History  Problem Relation Age of Onset  . Hypertension Father   . CAD Brother      Prior to Admission medications   Medication Sig Start Date End Date Taking? Authorizing Provider  acetaminophen (TYLENOL) 500 MG tablet Take 1,000 mg by mouth every 6 (six) hours as needed for mild pain or moderate pain.     [provider]  amLODipine (NORVASC) 5 MG tablet Take 5 mg by mouth daily.    [provider]  docusate sodium (COLACE) 100 MG capsule Take 100 mg by mouth See admin instructions. Take 100 mg daily, may take an additional 100 mg twice daily as needed for constipation    [provider]  escitalopram (LEXAPRO) 20 MG tablet Take 1 tablet (20 mg total) by mouth daily. 03/14/18   Katha Hamming, MD  fluticasone (FLONASE) 50 MCG/ACT nasal spray Place 2 sprays into both nostrils 2 (two) times daily as needed for allergies or rhinitis.     [provider]  ibuprofen  (ADVIL,MOTRIN) 200 MG tablet Take 800 mg by mouth every 6 (six) hours as needed for headache or moderate pain.    [provider]  ondansetron (ZOFRAN ODT) 4 MG disintegrating tablet Take 1 tablet (4 mg total) by mouth every 8 (eight) hours as needed for nausea or vomiting. 07/29/18   Auburn Bilberry, MD  ranitidine (ZANTAC) 150 MG tablet Take 150 mg by mouth 3 (three) times daily as needed for heartburn.     [provider]  triamcinolone cream (KENALOG) 0.1 % Apply 1 application topically daily as needed (rash).     [provider]    Physical Exam: Vitals:   03/04/20 1853 03/04/20 1854  BP: (!) 162/65   Pulse: 89   Resp: 18   Temp: 98.8 F (37.1 C)   TempSrc: Oral   SpO2: 97%   Weight:  74.5 kg  Height:  5' (1.524 m)     Vitals:   03/04/20 1853 03/04/20 1854  BP: (!) 162/65   Pulse: 89   Resp: 18   Temp: 98.8 F (37.1 C)   TempSrc: Oral   SpO2: 97%   Weight:  74.5 kg  Height:  5' (1.524 m)    Constitutional: Alert and awake, oriented x3, not in any acute distress. Eyes: PERLA, EOMI, irises appear normal, anicteric sclera,  ENMT: external ears and nose appear normal, normal hearing             Lips appears normal, oropharynx mucosa, tongue, posterior pharynx appear normal  Neck: neck appears normal, no masses, normal ROM, no thyromegaly, no JVD  CVS: S1-S2 clear, no murmur rubs or gallops,  , no carotid bruits, pedal pulses palpable, No LE edema Respiratory:  clear to auscultation bilaterally, no wheezing, rales or rhonchi. Respiratory effort normal. No accessory muscle use.  Abdomen: soft tender in epigastrium and left hemiabdomen, nondistended, normal bowel sounds, no hepatosplenomegaly, no hernias Musculoskeletal: : no cyanosis, clubbing , no contractures or atrophy Neuro: Cranial nerves II-XII intact, sensation, reflexes normal, strength Psych: judgement and insight appear normal, stable mood and affect,  Skin: no rashes or lesions or  ulcers, no induration or nodules   Labs on Admission: I have personally reviewed following labs and imaging studies  CBC: Recent Labs  Lab 03/04/20 1900  WBC 11.3*  HGB 15.5*  HCT 46.5*  MCV 90.5  PLT 326   Basic Metabolic Panel: Recent Labs  Lab 03/04/20 1900  NA 137  K 3.5  CL 106  CO2 25  GLUCOSE 115*  BUN 12  CREATININE 0.80  CALCIUM 8.4*   GFR: Estimated Creatinine Clearance: 48.9 mL/min (by C-G formula based on SCr of 0.8 mg/dL). Liver Function Tests: Recent Labs  Lab 03/04/20 1900  AST 16  ALT 11  ALKPHOS 89  BILITOT 1.1  PROT 7.0  ALBUMIN 3.4*   Recent Labs  Lab 03/04/20 1900  LIPASE 22   No results for input(s): AMMONIA in the last 168 hours. Coagulation  Profile: No results for input(s): INR, PROTIME in the last 168 hours. Cardiac Enzymes: No results for input(s): CKTOTAL, CKMB, CKMBINDEX, TROPONINI in the last 168 hours. BNP (last 3 results) No results for input(s): PROBNP in the last 8760 hours. HbA1C: No results for input(s): HGBA1C in the last 72 hours. CBG: No results for input(s): GLUCAP in the last 168 hours. Lipid Profile: No results for input(s): CHOL, HDL, LDLCALC, TRIG, CHOLHDL, LDLDIRECT in the last 72 hours. Thyroid Function Tests: No results for input(s): TSH, T4TOTAL, FREET4, T3FREE, THYROIDAB in the last 72 hours. Anemia Panel: No results for input(s): VITAMINB12, FOLATE, FERRITIN, TIBC, IRON, RETICCTPCT in the last 72 hours. Urine analysis:    Component Value Date/Time   COLORURINE YELLOW (A) 03/04/2020 1900   APPEARANCEUR CLOUDY (A) 03/04/2020 1900   APPEARANCEUR Hazy 03/27/2013 1114   LABSPEC 1.016 03/04/2020 1900   LABSPEC 1.026 03/27/2013 1114   PHURINE 6.0 03/04/2020 1900   GLUCOSEU NEGATIVE 03/04/2020 1900   GLUCOSEU Negative 03/27/2013 1114   HGBUR SMALL (A) 03/04/2020 1900   BILIRUBINUR NEGATIVE 03/04/2020 1900   BILIRUBINUR Negative 03/27/2013 1114   KETONESUR NEGATIVE 03/04/2020 1900   PROTEINUR NEGATIVE  03/04/2020 1900   NITRITE NEGATIVE 03/04/2020 1900   LEUKOCYTESUR LARGE (A) 03/04/2020 1900   LEUKOCYTESUR Negative 03/27/2013 1114    Radiological Exams on Admission: DG Chest 2 View  Result Date: 03/05/2020 CLINICAL DATA:  84 year old female with shortness of breath. EXAM: CHEST - 2 VIEW COMPARISON:  Chest radiograph dated 02/28/2016. FINDINGS: No focal consolidation, pleural effusion, pneumothorax. Mild bilateral peripheral and subpleural interstitial coarsening and nodularity, possibly chronic. Acute atypical pneumonia is less likely but not excluded. Clinical correlation is recommended. Stable cardiac silhouette. No acute osseous pathology. IMPRESSION: No focal consolidation. Bilateral peripheral and subpleural interstitial coarsening and nodularity, possibly chronic. Electronically Signed   By: Elgie Collard M.D.   On: 03/05/2020 00:19   CT ABDOMEN PELVIS W CONTRAST  Result Date: 03/05/2020 CLINICAL DATA:  Nausea, vomiting, lower abdominal and lower back pain for approximately 3 months EXAM: CT ABDOMEN AND PELVIS WITH CONTRAST TECHNIQUE: Multidetector CT imaging of the abdomen and pelvis was performed using the standard protocol following bolus administration of intravenous contrast. CONTRAST:  OMNIPAQUE IOHEXOL 300 MG/ML  SOLN COMPARISON:  CT abdomen pelvis 07/28/2018 FINDINGS: Lower chest: Bandlike opacity in the left lung base likely reflecting atelectasis or scarring. More fine reticular basilar opacities with some subpleural sparing could reflect developing interstitial fibrosis. No consolidation or effusion. Cardiac size at the upper limits of normal. No pericardial effusion. Hepatobiliary: Multiple subcentimeter hypertension foci throughout the liver too small to fully characterize on CT imaging but statistically likely benign. No worrisome focal liver lesions. Smooth liver surface contour. Normal hepatic attenuation. Gallbladder appears to be surgically absent. There is prominence  of the biliary tree with the extrahepatic bile duct measuring up to 17 mm which is somewhat greater than expected for age and reservoir effect but is unchanged from comparison exams. No visible calcified gallstones. Pancreas: Mild pancreatic atrophy. No peripancreatic inflammation or ductal dilatation. Spleen: Normal in size without focal abnormality. Small accessory splenule seen anteriorly. Adrenals/Urinary Tract: Normal adrenal glands. Kidneys are normally located with symmetric enhancement and excretion. No suspicious renal lesion, urolithiasis or hydronephrosis. Urinary bladder is largely decompressed at the time of exam and therefore poorly evaluated by CT imaging. Some mild perivesicular hazy stranding is noted though may be reactive. Stomach/Bowel: Distal esophagus, stomach and duodenal sweep are unremarkable. No small bowel wall thickening or  dilatation. No evidence of obstruction. The appendix is surgically absent. Proximal colon is unremarkable. There are scattered distal colonic diverticula with a focally thickened segment of distal sigmoid with pericolonic stranding centered upon a culprit diverticulum (2/67). No extraluminal fluid, gas, organized collection or abscess is seen. Vascular/Lymphatic: Atherosclerotic plaque throughout the abdominal aorta and branch vessels. No acute vascular anomaly. Few reactive nodes in the low abdomen. No pathologically enlarged abdominopelvic nodes. Reproductive: Uterus is surgically absent. No concerning adnexal lesions. Other: Pericolonic stranding as above. No abdominopelvic free fluid or air. No bowel containing hernias. Small fat containing umbilical hernia. Musculoskeletal: Multilevel degenerative changes are present in the imaged portions of the spine. Mild levocurvature of the lumbar spine. Additional degenerative changes in the hips and pelvis. The osseous structures appear diffusely demineralized which may limit detection of small or nondisplaced fractures. No  acute osseous abnormality or suspicious osseous lesion. IMPRESSION: 1. Findings compatible with acute uncomplicated diverticulitis of the distal sigmoid colon. No evidence of perforation or abscess formation. 2. Mild perivesicular hazy stranding is noted though may be reactive. Recommend correlation with urinalysis to exclude the possibility of cystitis. 3. Stable prominence of the biliary tree with the extrahepatic bile duct measuring up to 17 mm which is somewhat greater than expected for age and reservoir effect but is unchanged from comparison exams. 4. Aortic Atherosclerosis (ICD10-I70.0). Electronically Signed   By: Lovena Le M.D.   On: 03/05/2020 00:34    EKG: Independently reviewed.   Assessment/Plan Active Problems:   Acute diverticulitis -Patient presents with abdominal pain and LLQ, WBC 11,000 with CT confirming uncomplicated diverticulitis -IV Zosyn -Clear liquids -IV hydration, IV antiemetics, IV narcotics    UTI (urinary tract infection) -Follow urine culture -Antibiotics as above    HTN (hypertension) -Continue home meds pending med rec    DVT prophylaxis: Lovenox  Code Status:DNR Family Communication:  none  Disposition Plan: Back to previous home environment Consults called: none  Status:obs    Athena Masse MD [facility]     03/05/2020, 1:28 AM

## 2020-03-05 NOTE — Progress Notes (Signed)
PROGRESS NOTE  Marilyn Andrews MHD:[id number] DOB: [birth year] DOA: 03/04/2020 PCP: [facility], [facility]  Brief History   84 year old woman presented with several week history of left lower quadrant pain that became worse over the last 1 to 2 days prior.  Admitted for acute diverticulitis.  A & P  Acute diverticulitis with left lower quadrant abdominal pain and leukocytosis on admission.  CT showed uncomplicated diverticulitis. --Continues to have abdominal pain.  Continue current diet, analgesics, IV antibiotics  Essential hypertension --Stable.  Resume amlodipine.  Disposition Plan:   Status is: Observation  The patient will require care spanning > 2 midnights and should be moved to inpatient because: IV treatments appropriate due to intensity of illness or inability to take PO  Dispo: The patient is from: Home              Anticipated d/c is to: Home              Anticipated d/c date is: 1-2 days              Patient currently is not medically stable to d/c.  DVT prophylaxis: enoxaparin Code Status: DNR Family Communication: none requested    Brendia Sacks, MD  [facility] Direct contact: see [email or web address] (further directions at bottom of note if needed) 7PM-7AM contact night coverage as at bottom of note 03/05/2020, 4:25 PM  LOS: 0 days   Significant Hospital Events   .    Consults:  .    Procedures:  .   Significant Diagnostic Tests:  Marland Kitchen    Micro Data:  .    Antimicrobials:  .   Interval History/Subjective  She has left lower quadrant pain consistent with previous bouts of diverticulitis.  She also reports chronic shortness of breath of over a year.  Also reports upper back pain on the left side which is chronic.  Objective   Vitals:  Vitals:   03/05/20 1151 03/05/20 1620  BP: (!) 130/55 (!) 166/65  Pulse: (!) 58 (!) 58  Resp: 16 16  Temp: 98.4 F (36.9 C)   SpO2: 94% 98%    Exam:  Constitutional.  Appears calm,  comfortable. Respiratory.  Clear to auscultation bilaterally.  No wheezes, rales or rhonchi.  Normal respiratory effort. Cardiovascular.  Regular rate and rhythm.  No murmur, rub or gallop.  No lower extremity edema. Abdomen.  Left lower quadrant pain with palpation. Psychiatric.  Grossly normal mood and affect.  Speech fluent and appropriate.  I have personally reviewed the following:   Today's Data  . Basic metabolic panel unremarkable . CBC unremarkable  Scheduled Meds: . enoxaparin (LOVENOX) injection  40 mg Subcutaneous Q24H  . pantoprazole (PROTONIX) IV  40 mg Intravenous Q24H  . sodium chloride flush  3 mL Intravenous Once   Continuous Infusions: . sodium chloride Stopped (03/05/20 1136)  . [START ON 03/06/2020] cefTRIAXone (ROCEPHIN)  IV    . lactated ringers    . metronidazole Stopped (03/05/20 2119)    Active Problems:   Acute diverticulitis   UTI (urinary tract infection)   HTN (hypertension)   LOS: 0 days   How to contact the [facility] Attending or Consulting provider 7A - 7P or covering provider during after hours 7P -7A, for this patient?  1. Check the care team in [facility] and look for a) attending/consulting [facility] provider listed and b) the [facility] team listed 2. Log into [email or web address] and use [place]'s universal password to access. If you do not have  the password, please contact the hospital operator. 3. Locate the [facility] provider you are looking for under [facility] and page to a number that you can be directly reached. 4. If you still have difficulty reaching the provider, please page the [facility] (Director on Call) for the Hospitalists listed on amion for assistance.

## 2020-03-06 ENCOUNTER — Inpatient Hospital Stay: Payer: Medicare Other

## 2020-03-06 DIAGNOSIS — I10 Essential (primary) hypertension: Secondary | ICD-10-CM

## 2020-03-06 DIAGNOSIS — K5792 Diverticulitis of intestine, part unspecified, without perforation or abscess without bleeding: Secondary | ICD-10-CM

## 2020-03-06 MED ORDER — SENNOSIDES-DOCUSATE SODIUM 8.6-50 MG PO TABS
2.0000 | ORAL_TABLET | Freq: Two times a day (BID) | ORAL | Status: DC
  Administered 2020-03-06 – 2020-03-08 (×5): 2 via ORAL
  Filled 2020-03-06 (×5): qty 2

## 2020-03-06 MED ORDER — ZINC OXIDE 40 % EX OINT
TOPICAL_OINTMENT | Freq: Three times a day (TID) | CUTANEOUS | Status: DC | PRN
  Filled 2020-03-06 (×2): qty 113

## 2020-03-06 NOTE — Progress Notes (Signed)
Order received from Dr Chipper Herb to change diet to soft diet.

## 2020-03-06 NOTE — Progress Notes (Signed)
PROGRESS NOTE    Marilyn Andrews  ZSW:[id number] DOB: 1935-11-13 DOA: 03/04/2020 PCP: [facility], [facility] (Confirm with patient/family/NH records and if not entered, this HAS to be entered at [facility] point of entry. "No PCP" if truly none.)   Brief Narrative: (Start on day 1 of progress note - keep it brief and live) Patient is a 84 year old female with history of essential hypertension and depression, present to the emergency room with left lower quadrant pain.  Symptoms worsened for 1 to 2 days before admission.  She did not have any fever or chills.  CT scan of abdomen/pelvis showed acute diverticulitis.  Patient has been treated with IV antibiotics. Patient also complaining of morning fatigue and snoring in the nighttime.  She wished to have some work-up for it.   Assessment & Plan:   Active Problems:   Acute diverticulitis   UTI (urinary tract infection)   HTN (hypertension)  #1.  Acute diverticulitis. Feels better today, abdominal pain improving, no nausea vomiting today.  Currently on Zosyn, will continue treatment.  Patient is able to eat, discontinue IV fluids.  Planning to discharge home tomorrow. Patient also had some abnormal urine, urine culture grew cons of bacteria, not consistent with UTI.  2.  Possible obstructive sleep apnea. Will need outpatient sleep study.  3.  Essential hypertension. Continue home medicines.  4.  Left leg edema and tenderness. No evidence of cellulitis.  Will obtain duplex ultrasound to rule out DVT.  5.  Constipation. Stool softener.   DVT prophylaxis: Lovenox  Code Status:DNR Family Communication:  none  Disposition Plan: Back to previous home environment Consults called: none   Procedures: None Antimicrobials: Zosyn  Subjective: Abdominal pain has been better today.  Nausea improving.  No vomiting.  No fever or chills.  Patient has been chronically constipated.  Objective: Vitals:   03/05/20 1151 03/05/20 1620 03/05/20  2037 03/06/20 0618  BP: (!) 130/55 (!) 166/65 (!) 144/46 (!) 142/46  Pulse: (!) 58 (!) 58 66 (!) 58  Resp: 16 16 18 20   Temp: 98.4 F (36.9 C)  98.1 F (36.7 C) 98.2 F (36.8 C)  TempSrc: Oral  Oral Oral  SpO2: 94% 98% 95% 96%  Weight:      Height:        Intake/Output Summary (Last 24 hours) at 03/06/2020 1226 Last data filed at 03/06/2020 1021 Gross per 24 hour  Intake 1843.96 ml  Output 400 ml  Net 1443.96 ml   Filed Weights   03/04/20 1854  Weight: 74.5 kg    Examination:  General exam: Appears calm and comfortable  Respiratory system: Clear to auscultation. Respiratory effort normal. Cardiovascular system: S1 & S2 heard, RRR. No JVD, murmurs, rubs, gallops or clicks. No pedal edema. Gastrointestinal system: Abdomen is nondistended, soft and mild LLQ tender. No organomegaly or masses felt. Normal bowel sounds heard. Central nervous system: Alert and oriented. No focal neurological deficits. Extremities: Symmetric 5 x 5 power. Skin: No rashes, lesions or ulcers Psychiatry: Judgement and insight appear normal. Mood & affect appropriate.     Data Reviewed: I have personally reviewed following labs and imaging studies  CBC: Recent Labs  Lab 03/04/20 1900 03/05/20 0543  WBC 11.3* 10.5  HGB 15.5* 13.8  HCT 46.5* 41.3  MCV 90.5 91.8  PLT 326 290   Basic Metabolic Panel: Recent Labs  Lab 03/04/20 1900 03/05/20 0543  NA 137 137  K 3.5 3.5  CL 106 109  CO2 25 22  GLUCOSE  115* 150*  BUN 12 12  CREATININE 0.80 0.88  CALCIUM 8.4* 7.6*   GFR: Estimated Creatinine Clearance: 44.4 mL/min (by C-G formula based on SCr of 0.88 mg/dL). Liver Function Tests: Recent Labs  Lab 03/04/20 1900  AST 16  ALT 11  ALKPHOS 89  BILITOT 1.1  PROT 7.0  ALBUMIN 3.4*   Recent Labs  Lab 03/04/20 1900  LIPASE 22   No results for input(s): AMMONIA in the last 168 hours. Coagulation Profile: No results for input(s): INR, PROTIME in the last 168 hours. Cardiac  Enzymes: No results for input(s): CKTOTAL, CKMB, CKMBINDEX, TROPONINI in the last 168 hours. BNP (last 3 results) No results for input(s): PROBNP in the last 8760 hours. HbA1C: No results for input(s): HGBA1C in the last 72 hours. CBG: Recent Labs  Lab 03/05/20 0752  GLUCAP 101*   Lipid Profile: No results for input(s): CHOL, HDL, LDLCALC, TRIG, CHOLHDL, LDLDIRECT in the last 72 hours. Thyroid Function Tests: No results for input(s): TSH, T4TOTAL, FREET4, T3FREE, THYROIDAB in the last 72 hours. Anemia Panel: No results for input(s): VITAMINB12, FOLATE, FERRITIN, TIBC, IRON, RETICCTPCT in the last 72 hours. Sepsis Labs: Recent Labs  Lab 03/04/20 2348  LATICACIDVEN 1.0    Recent Results (from the past 240 hour(s))  Urine culture     Status: Abnormal   Collection Time: 03/04/20  7:00 PM   Specimen: Urine, Clean Catch  Result Value Ref Range Status   Specimen Description   Final    URINE, CLEAN CATCH Performed at [facility], [address] [place], [place] [address]    Special Requests NONE  Final   Culture (A)  Final    <10,000 COLONIES/mL INSIGNIFICANT GROWTH Performed at [facility], [place] [address] [place], [place] [address]    Report Status 03/05/2020 FINAL  Final  Respiratory Panel by RT PCR (Flu A&B, Covid) - Nasopharyngeal Swab     Status: None   Collection Time: 03/05/20  2:58 AM   Specimen: Nasopharyngeal Swab  Result Value Ref Range Status   SARS Coronavirus 2 by RT PCR NEGATIVE NEGATIVE Final    Comment: (NOTE) SARS-CoV-2 target nucleic acids are NOT DETECTED. The SARS-CoV-2 RNA is generally detectable in upper respiratoy specimens during the acute phase of infection. The lowest concentration of SARS-CoV-2 viral copies this assay can detect is 131 copies/mL. A negative result does not preclude SARS-Cov-2 infection and should not be used as the sole basis for treatment or other patient management decisions. A negative result may occur  with  improper specimen collection/handling, submission of specimen other than nasopharyngeal swab, presence of viral mutation(s) within the areas targeted by this assay, and inadequate number of viral copies (<131 copies/mL). A negative result must be combined with clinical observations, patient history, and epidemiological information. The expected result is Negative. Fact Sheet for Patients:  PinkCheek.be Fact Sheet for Healthcare Providers:  GravelBags.it This test is not yet ap proved or cleared by the [place] FDA and  has been authorized for detection and/or diagnosis of SARS-CoV-2 by FDA under an Emergency Use Authorization (EUA). This EUA will remain  in effect (meaning this test can be used) for the duration of the COVID-19 declaration under Section 564(b)(1) of the Act, 21 U.S.C. section 360bbb-3(b)(1), unless the authorization is terminated or revoked sooner.    Influenza A by PCR NEGATIVE NEGATIVE Final   Influenza B by PCR NEGATIVE NEGATIVE Final    Comment: (NOTE) The Xpert Xpress SARS-CoV-2/FLU/RSV assay is  intended as an aid in  the diagnosis of influenza from Nasopharyngeal swab specimens and  should not be used as a sole basis for treatment. Nasal washings and  aspirates are unacceptable for Xpert Xpress SARS-CoV-2/FLU/RSV  testing. Fact Sheet for Patients: https://www.moore.com/ Fact Sheet for Healthcare Providers: https://www.young.biz/ This test is not yet approved or cleared by the [place] FDA and  has been authorized for detection and/or diagnosis of SARS-CoV-2 by  FDA under an Emergency Use Authorization (EUA). This EUA will remain  in effect (meaning this test can be used) for the duration of the  Covid-19 declaration under Section 564(b)(1) of the Act, 21  U.S.C. section 360bbb-3(b)(1), unless the authorization is  terminated or revoked. Performed  at [facility], [address], [place], [place] [address]          Radiology Studies: DG Chest 2 View  Result Date: 03/05/2020 CLINICAL DATA:  84 year old female with shortness of breath. EXAM: CHEST - 2 VIEW COMPARISON:  Chest radiograph dated 02/28/2016. FINDINGS: No focal consolidation, pleural effusion, pneumothorax. Mild bilateral peripheral and subpleural interstitial coarsening and nodularity, possibly chronic. Acute atypical pneumonia is less likely but not excluded. Clinical correlation is recommended. Stable cardiac silhouette. No acute osseous pathology. IMPRESSION: No focal consolidation. Bilateral peripheral and subpleural interstitial coarsening and nodularity, possibly chronic. Electronically Signed   By: Elgie Collard M.D.   On: 03/05/2020 00:19   CT ABDOMEN PELVIS W CONTRAST  Result Date: 03/05/2020 CLINICAL DATA:  Nausea, vomiting, lower abdominal and lower back pain for approximately 3 months EXAM: CT ABDOMEN AND PELVIS WITH CONTRAST TECHNIQUE: Multidetector CT imaging of the abdomen and pelvis was performed using the standard protocol following bolus administration of intravenous contrast. CONTRAST:  OMNIPAQUE IOHEXOL 300 MG/ML  SOLN COMPARISON:  CT abdomen pelvis 07/28/2018 FINDINGS: Lower chest: Bandlike opacity in the left lung base likely reflecting atelectasis or scarring. More fine reticular basilar opacities with some subpleural sparing could reflect developing interstitial fibrosis. No consolidation or effusion. Cardiac size at the upper limits of normal. No pericardial effusion. Hepatobiliary: Multiple subcentimeter hypertension foci throughout the liver too small to fully characterize on CT imaging but statistically likely benign. No worrisome focal liver lesions. Smooth liver surface contour. Normal hepatic attenuation. Gallbladder appears to be surgically absent. There is prominence of the biliary tree with the extrahepatic bile duct measuring up to  17 mm which is somewhat greater than expected for age and reservoir effect but is unchanged from comparison exams. No visible calcified gallstones. Pancreas: Mild pancreatic atrophy. No peripancreatic inflammation or ductal dilatation. Spleen: Normal in size without focal abnormality. Small accessory splenule seen anteriorly. Adrenals/Urinary Tract: Normal adrenal glands. Kidneys are normally located with symmetric enhancement and excretion. No suspicious renal lesion, urolithiasis or hydronephrosis. Urinary bladder is largely decompressed at the time of exam and therefore poorly evaluated by CT imaging. Some mild perivesicular hazy stranding is noted though may be reactive. Stomach/Bowel: Distal esophagus, stomach and duodenal sweep are unremarkable. No small bowel wall thickening or dilatation. No evidence of obstruction. The appendix is surgically absent. Proximal colon is unremarkable. There are scattered distal colonic diverticula with a focally thickened segment of distal sigmoid with pericolonic stranding centered upon a culprit diverticulum (2/67). No extraluminal fluid, gas, organized collection or abscess is seen. Vascular/Lymphatic: Atherosclerotic plaque throughout the abdominal aorta and branch vessels. No acute vascular anomaly. Few reactive nodes in the low abdomen. No pathologically enlarged abdominopelvic nodes. Reproductive: Uterus is surgically absent. No concerning adnexal lesions. Other:  Pericolonic stranding as above. No abdominopelvic free fluid or air. No bowel containing hernias. Small fat containing umbilical hernia. Musculoskeletal: Multilevel degenerative changes are present in the imaged portions of the spine. Mild levocurvature of the lumbar spine. Additional degenerative changes in the hips and pelvis. The osseous structures appear diffusely demineralized which may limit detection of small or nondisplaced fractures. No acute osseous abnormality or suspicious osseous lesion. IMPRESSION:  1. Findings compatible with acute uncomplicated diverticulitis of the distal sigmoid colon. No evidence of perforation or abscess formation. 2. Mild perivesicular hazy stranding is noted though may be reactive. Recommend correlation with urinalysis to exclude the possibility of cystitis. 3. Stable prominence of the biliary tree with the extrahepatic bile duct measuring up to 17 mm which is somewhat greater than expected for age and reservoir effect but is unchanged from comparison exams. 4. Aortic Atherosclerosis (ICD10-I70.0). Electronically Signed   By: Kreg Shropshire M.D.   On: 03/05/2020 00:34        Scheduled Meds: . amLODipine  5 mg Oral Daily  . enoxaparin (LOVENOX) injection  40 mg Subcutaneous Q24H  . escitalopram  20 mg Oral Daily  . mirtazapine  15 mg Oral QHS  . pantoprazole (PROTONIX) IV  40 mg Intravenous Q24H  . sodium chloride flush  3 mL Intravenous Once   Continuous Infusions: . sodium chloride Stopped (03/05/20 1739)  . ampicillin-sulbactam (UNASYN) IV 3 g (03/06/20 5056)  . lactated ringers 100 mL/hr at 03/06/20 0903     LOS: 1 day    Time spent: 25 minutes    Marrion Coy, MD [facility]   To contact the attending provider between 7A-7P or the covering provider during after hours 7P-7A, please log into the web site [email or web address] and access using universal [place] password for that web site. If you do not have the password, please call the hospital operator.  03/06/2020, 12:26 PM

## 2020-03-07 LAB — MAGNESIUM: Magnesium: 2.1 mg/dL (ref 1.7–2.4)

## 2020-03-07 LAB — BASIC METABOLIC PANEL
Anion gap: 5 (ref 5–15)
BUN: 9 mg/dL (ref 8–23)
CO2: 27 mmol/L (ref 22–32)
Calcium: 8.3 mg/dL — ABNORMAL LOW (ref 8.9–10.3)
Chloride: 108 mmol/L (ref 98–111)
Creatinine, Ser: 1.13 mg/dL — ABNORMAL HIGH (ref 0.44–1.00)
GFR calc Af Amer: 52 mL/min — ABNORMAL LOW (ref >60)
GFR calc non Af Amer: 45 mL/min — ABNORMAL LOW (ref >60)
Glucose, Bld: 115 mg/dL — ABNORMAL HIGH (ref 70–99)
Potassium: 3.6 mmol/L (ref 3.5–5.1)
Sodium: 140 mmol/L (ref 135–145)

## 2020-03-07 MED ORDER — LACTULOSE 10 GM/15ML PO SOLN
30.0000 g | Freq: Once | ORAL | Status: AC
  Administered 2020-03-07: 30 g via ORAL
  Filled 2020-03-07: qty 60

## 2020-03-07 MED ORDER — AMOXICILLIN-POT CLAVULANATE 875-125 MG PO TABS
1.0000 | ORAL_TABLET | Freq: Two times a day (BID) | ORAL | Status: DC
  Administered 2020-03-07 – 2020-03-08 (×2): 1 via ORAL
  Filled 2020-03-07 (×2): qty 1

## 2020-03-07 NOTE — Progress Notes (Signed)
PROGRESS NOTE    Marilyn Andrews  BHA:[id number] DOB: 1936/04/22 DOA: 03/04/2020 PCP: [facility], [facility]   Brief Narrative: (Start on day 1 of progress note - keep it brief and live) Patient is a 84 year old female with history of essential hypertension and depression, present to the emergency room with left lower quadrant pain.  Symptoms worsened for 1 to 2 days before admission.  She did not have any fever or chills.  CT scan of abdomen/pelvis showed acute diverticulitis.  Patient has been treated with IV antibiotics. Patient also complaining of morning fatigue and snoring in the nighttime.  She wished to have some work-up for it.   Assessment & Plan:   Active Problems:   Acute diverticulitis   UTI (urinary tract infection)   HTN (hypertension)  1. Acute diverticulitis. Patient still complaining of some abdominal cramping, she states that as she is of are constipated, last time she had a bowel movement was 3 days ago which was pretty small.  She will be given lactulose today.  I will change her antibiotic to oral for diverticulitis.  She still has some abdominal cramping, I will keep her overnight.  Discharge her tomorrow.  2.  Obstructive sleep apnea. We will schedule outpatient sleep study.  3.  Essential hypertension. Continue home medicines.  4.  Left leg edema and tenderness. Duplex ultrasound to rule out DVT.  DVT prophylaxis:Lovenox  Code Status:DNR Family Communication:none  Disposition Plan:Back to previous home environment Consults called:none  Procedures: None Antimicrobials:  Augmentin.  Subjective: Patient still complaining of abdominal cramping, constipated.  No bowel movement for the last 3 days.  No nausea vomiting. No fever or chills. No shortness of breath.  Objective: Vitals:   03/07/20 0400 03/07/20 0632 03/07/20 0951 03/07/20 1453  BP: (!) 194/97 (!) 156/63 (!) 155/59 (!) 151/72  Pulse: 91 62 63 68  Resp: 20   18  Temp: 97.9 F  (36.6 C)   98.6 F (37 C)  TempSrc: Oral   Oral  SpO2: 94%   91%  Weight:      Height:        Intake/Output Summary (Last 24 hours) at 03/07/2020 1525 Last data filed at 03/07/2020 1004 Gross per 24 hour  Intake 2352.03 ml  Output 300 ml  Net 2052.03 ml   Filed Weights   03/04/20 1854  Weight: 74.5 kg    Examination:  General exam: Appears calm and comfortable  Respiratory system: Clear to auscultation. Respiratory effort normal. Cardiovascular system: S1 & S2 heard, RRR. No JVD, murmurs, rubs, gallops or clicks. No pedal edema. Gastrointestinal system: Abdomen is nondistended, soft and LLQ tender. No organomegaly or masses felt. Normal bowel sounds heard. Central nervous system: Alert and oriented. No focal neurological deficits. Extremities: Symmetric 5 x 5 power. Skin: No rashes, lesions or ulcers Psychiatry: Judgement and insight appear normal. Mood & affect appropriate.     Data Reviewed: I have personally reviewed following labs and imaging studies  CBC: Recent Labs  Lab 03/04/20 1900 03/05/20 0543  WBC 11.3* 10.5  HGB 15.5* 13.8  HCT 46.5* 41.3  MCV 90.5 91.8  PLT 326 290   Basic Metabolic Panel: Recent Labs  Lab 03/04/20 1900 03/05/20 0543 03/07/20 0457  NA 137 137 140  K 3.5 3.5 3.6  CL 106 109 108  CO2 25 22 27   GLUCOSE 115* 150* 115*  BUN 12 12 9   CREATININE 0.80 0.88 1.13*  CALCIUM 8.4* 7.6* 8.3*  MG  --   --  2.1   GFR: Estimated Creatinine Clearance: 34.6 mL/min (A) (by C-G formula based on SCr of 1.13 mg/dL (H)). Liver Function Tests: Recent Labs  Lab 03/04/20 1900  AST 16  ALT 11  ALKPHOS 89  BILITOT 1.1  PROT 7.0  ALBUMIN 3.4*   Recent Labs  Lab 03/04/20 1900  LIPASE 22   No results for input(s): AMMONIA in the last 168 hours. Coagulation Profile: No results for input(s): INR, PROTIME in the last 168 hours. Cardiac Enzymes: No results for input(s): CKTOTAL, CKMB, CKMBINDEX, TROPONINI in the last 168 hours. BNP (last 3  results) No results for input(s): PROBNP in the last 8760 hours. HbA1C: No results for input(s): HGBA1C in the last 72 hours. CBG: Recent Labs  Lab 03/05/20 0752  GLUCAP 101*   Lipid Profile: No results for input(s): CHOL, HDL, LDLCALC, TRIG, CHOLHDL, LDLDIRECT in the last 72 hours. Thyroid Function Tests: No results for input(s): TSH, T4TOTAL, FREET4, T3FREE, THYROIDAB in the last 72 hours. Anemia Panel: No results for input(s): VITAMINB12, FOLATE, FERRITIN, TIBC, IRON, RETICCTPCT in the last 72 hours. Sepsis Labs: Recent Labs  Lab 03/04/20 2348  LATICACIDVEN 1.0    Recent Results (from the past 240 hour(s))  Urine culture     Status: Abnormal   Collection Time: 03/04/20  7:00 PM   Specimen: Urine, Clean Catch  Result Value Ref Range Status   Specimen Description   Final    URINE, CLEAN CATCH Performed at [facility], [address], [place], [place] [address]    Special Requests NONE  Final   Culture (A)  Final    <10,000 COLONIES/mL INSIGNIFICANT GROWTH Performed at [facility], [place] [address] [place], [place] [address]    Report Status 03/05/2020 FINAL  Final  Respiratory Panel by RT PCR (Flu A&B, Covid) - Nasopharyngeal Swab     Status: None   Collection Time: 03/05/20  2:58 AM   Specimen: Nasopharyngeal Swab  Result Value Ref Range Status   SARS Coronavirus 2 by RT PCR NEGATIVE NEGATIVE Final    Comment: (NOTE) SARS-CoV-2 target nucleic acids are NOT DETECTED. The SARS-CoV-2 RNA is generally detectable in upper respiratoy specimens during the acute phase of infection. The lowest concentration of SARS-CoV-2 viral copies this assay can detect is 131 copies/mL. A negative result does not preclude SARS-Cov-2 infection and should not be used as the sole basis for treatment or other patient management decisions. A negative result may occur with  improper specimen collection/handling, submission of specimen other than nasopharyngeal swab, presence  of viral mutation(s) within the areas targeted by this assay, and inadequate number of viral copies (<131 copies/mL). A negative result must be combined with clinical observations, patient history, and epidemiological information. The expected result is Negative. Fact Sheet for Patients:  PinkCheek.be Fact Sheet for Healthcare Providers:  GravelBags.it This test is not yet ap proved or cleared by the [place] FDA and  has been authorized for detection and/or diagnosis of SARS-CoV-2 by FDA under an Emergency Use Authorization (EUA). This EUA will remain  in effect (meaning this test can be used) for the duration of the COVID-19 declaration under Section 564(b)(1) of the Act, 21 U.S.C. section 360bbb-3(b)(1), unless the authorization is terminated or revoked sooner.    Influenza A by PCR NEGATIVE NEGATIVE Final   Influenza B by PCR NEGATIVE NEGATIVE Final    Comment: (NOTE) The Xpert Xpress SARS-CoV-2/FLU/RSV assay is intended as an aid in  the diagnosis of influenza from  Nasopharyngeal swab specimens and  should not be used as a sole basis for treatment. Nasal washings and  aspirates are unacceptable for Xpert Xpress SARS-CoV-2/FLU/RSV  testing. Fact Sheet for Patients: https://www.moore.com/ Fact Sheet for Healthcare Providers: https://www.young.biz/ This test is not yet approved or cleared by the [place] FDA and  has been authorized for detection and/or diagnosis of SARS-CoV-2 by  FDA under an Emergency Use Authorization (EUA). This EUA will remain  in effect (meaning this test can be used) for the duration of the  Covid-19 declaration under Section 564(b)(1) of the Act, 21  U.S.C. section 360bbb-3(b)(1), unless the authorization is  terminated or revoked. Performed at [facility], [address], [place], [place] [address]          Radiology Studies: US  Venous Img Lower Unilateral Left (DVT)  Result Date: 03/06/2020 CLINICAL DATA:  84 year old female with edema of the lower extremity EXAM: LEFT LOWER EXTREMITY VENOUS DOPPLER ULTRASOUND TECHNIQUE: Gray-scale sonography with graded compression, as well as color Doppler and duplex ultrasound were performed to evaluate the lower extremity deep venous systems from the level of the common femoral vein and including the common femoral, femoral, profunda femoral, popliteal and calf veins including the posterior tibial, peroneal and gastrocnemius veins when visible. The superficial great saphenous vein was also interrogated. Spectral Doppler was utilized to evaluate flow at rest and with distal augmentation maneuvers in the common femoral, femoral and popliteal veins. COMPARISON:  None. FINDINGS: Contralateral Common Femoral Vein: Respiratory phasicity is normal and symmetric with the symptomatic side. No evidence of thrombus. Normal compressibility. Common Femoral Vein: No evidence of thrombus. Normal compressibility, respiratory phasicity and response to augmentation. Saphenofemoral Junction: No evidence of thrombus. Normal compressibility and flow on color Doppler imaging. Profunda Femoral Vein: No evidence of thrombus. Normal compressibility and flow on color Doppler imaging. Femoral Vein: No evidence of thrombus. Normal compressibility, respiratory phasicity and response to augmentation. Popliteal Vein: No evidence of thrombus. Normal compressibility, respiratory phasicity and response to augmentation. Calf Veins: No evidence of thrombus. Normal compressibility and flow on color Doppler imaging. Superficial Great Saphenous Vein: No evidence of thrombus. Normal compressibility and flow on color Doppler imaging. Other Findings:  None. IMPRESSION: Sonographic survey of the left lower extremity negative for DVT Electronically Signed   By: Gilmer Mor D.O.   On: 03/06/2020 13:23        Scheduled Meds: .  amLODipine  5 mg Oral Daily  . enoxaparin (LOVENOX) injection  40 mg Subcutaneous Q24H  . escitalopram  20 mg Oral Daily  . mirtazapine  15 mg Oral QHS  . pantoprazole (PROTONIX) IV  40 mg Intravenous Q24H  . senna-docusate  2 tablet Oral BID  . sodium chloride flush  3 mL Intravenous Once   Continuous Infusions: . sodium chloride Stopped (03/06/20 0021)  . ampicillin-sulbactam (UNASYN) IV 3 g (03/07/20 1211)  . lactated ringers 100 mL/hr at 03/07/20 0943     LOS: 2 days    Time spent: 25 minutes    Marrion Coy, MD [facility]   To contact the attending provider between 7A-7P or the covering provider during after hours 7P-7A, please log into the web site [email or web address] and access using universal [place] password for that web site. If you do not have the password, please call the hospital operator.  03/07/2020, 3:25 PM

## 2020-03-07 NOTE — Progress Notes (Signed)
Patient said she hasn't had a BM in 3 days. Order received from Dr Chipper Herb for lactulose

## 2020-03-08 MED ORDER — PANTOPRAZOLE SODIUM 40 MG PO TBEC: 40.0000 mg | DELAYED_RELEASE_TABLET | Freq: Every day | ORAL | Status: DC

## 2020-03-08 MED ORDER — AMOXICILLIN-POT CLAVULANATE 875-125 MG PO TABS: 1.0000 | ORAL_TABLET | Freq: Two times a day (BID) | ORAL | 0 refills | Status: AC

## 2020-03-08 MED ORDER — LACTULOSE 20 G PO PACK: 20.0000 g | PACK | Freq: Every day | ORAL | 0 refills | Status: DC | PRN

## 2020-03-08 MED ORDER — SENNOSIDES-DOCUSATE SODIUM 8.6-50 MG PO TABS: 2.0000 | ORAL_TABLET | Freq: Two times a day (BID) | ORAL | 0 refills | Status: AC | PRN

## 2020-03-08 NOTE — Care Management Important Message (Signed)
Important Message  Patient Details  Name: Charlissa Petros Harps MRN: [id number] Date of Birth: 03/08/1936   Medicare Important Message Given:  Yes     [name] 03/08/2020, 1:31 PM

## 2020-03-08 NOTE — Discharge Instructions (Signed)

## 2020-03-08 NOTE — Plan of Care (Signed)
Discharge teaching completed with patient who verbalizes understanding of teaching and follow-up information.  Patient is in stable condition and has all her belongings.

## 2020-03-08 NOTE — Discharge Summary (Addendum)
Physician Discharge Summary  Patient ID: Weston Fulco Manzer MRN: [id number] DOB/AGE: 82-Nov-1937 82 y.o.  Admit date: 03/04/2020 Discharge date: 03/08/2020  Admission Diagnoses: Diverticulitis. Discharge Diagnoses:  Active Problems:   Acute diverticulitis   UTI (urinary tract infection)   HTN (hypertension)  Possible OSA  Discharged Condition: good  Hospital Course:  Patient is a 84 year old female with history of essential hypertension and depression, present to the emergency room with left lower quadrant pain. Symptoms worsened for 1 to 2 days before admission. She did not have any fever or chills. CT scan of abdomen/pelvis showed acute diverticulitis. Patient has been treated with IV antibiotics. Patient also complaining of morning fatigue and snoring in the nighttime. She wished to have some work-up for it.  1. Acute diverticulitis. Condition had improved.  Abdominal pain is resolving.  Patient has significant constipation, she was given lactulose yesterday, she had a bowel movement afterwards.  At discharge, she will be continued on antibiotics with Augmentin for 7 days.  She will be also given senna and lactulose as needed for constipation. Urine culture has insignificant growth, Augmentin should be okay.  2.  Possible obstructive sleep apnea. Spoke with Child psychotherapist, will try to schedule outpatient sleep study.  3.  Essential hypertension. Continue home medicines.  4.  Left leg edema and tenderness. Duplex ultrasound ruled out DVT.   Consults: None  Significant Diagnostic Studies:  EXAM: CT ABDOMEN AND PELVIS WITH CONTRAST  TECHNIQUE: Multidetector CT imaging of the abdomen and pelvis was performed using the standard protocol following bolus administration of intravenous contrast.  CONTRAST:  OMNIPAQUE IOHEXOL 300 MG/ML  SOLN  COMPARISON:  CT abdomen pelvis 07/28/2018  FINDINGS: Lower chest: Bandlike opacity in the left lung base likely reflecting  atelectasis or scarring. More fine reticular basilar opacities with some subpleural sparing could reflect developing interstitial fibrosis. No consolidation or effusion. Cardiac size at the upper limits of normal. No pericardial effusion.  Hepatobiliary: Multiple subcentimeter hypertension foci throughout the liver too small to fully characterize on CT imaging but statistically likely benign. No worrisome focal liver lesions. Smooth liver surface contour. Normal hepatic attenuation. Gallbladder appears to be surgically absent. There is prominence of the biliary tree with the extrahepatic bile duct measuring up to 17 mm which is somewhat greater than expected for age and reservoir effect but is unchanged from comparison exams. No visible calcified gallstones.  Pancreas: Mild pancreatic atrophy. No peripancreatic inflammation or ductal dilatation.  Spleen: Normal in size without focal abnormality. Small accessory splenule seen anteriorly.  Adrenals/Urinary Tract: Normal adrenal glands. Kidneys are normally located with symmetric enhancement and excretion. No suspicious renal lesion, urolithiasis or hydronephrosis. Urinary bladder is largely decompressed at the time of exam and therefore poorly evaluated by CT imaging. Some mild perivesicular hazy stranding is noted though may be reactive.  Stomach/Bowel: Distal esophagus, stomach and duodenal sweep are unremarkable. No small bowel wall thickening or dilatation. No evidence of obstruction. The appendix is surgically absent. Proximal colon is unremarkable. There are scattered distal colonic diverticula with a focally thickened segment of distal sigmoid with pericolonic stranding centered upon a culprit diverticulum (2/67). No extraluminal fluid, gas, organized collection or abscess is seen.  Vascular/Lymphatic: Atherosclerotic plaque throughout the abdominal aorta and branch vessels. No acute vascular anomaly. Few  reactive nodes in the low abdomen. No pathologically enlarged abdominopelvic nodes.  Reproductive: Uterus is surgically absent. No concerning adnexal lesions.  Other: Pericolonic stranding as above. No abdominopelvic free fluid or air. No bowel containing hernias.  Small fat containing umbilical hernia.  Musculoskeletal: Multilevel degenerative changes are present in the imaged portions of the spine. Mild levocurvature of the lumbar spine. Additional degenerative changes in the hips and pelvis. The osseous structures appear diffusely demineralized which may limit detection of small or nondisplaced fractures. No acute osseous abnormality or suspicious osseous lesion.  IMPRESSION: 1. Findings compatible with acute uncomplicated diverticulitis of the distal sigmoid colon. No evidence of perforation or abscess formation. 2. Mild perivesicular hazy stranding is noted though may be reactive. Recommend correlation with urinalysis to exclude the possibility of cystitis. 3. Stable prominence of the biliary tree with the extrahepatic bile duct measuring up to 17 mm which is somewhat greater than expected for age and reservoir effect but is unchanged from comparison exams. 4. Aortic Atherosclerosis (ICD10-I70.0).   Electronically Signed   By: Kreg Shropshire M.D.   On: 03/05/2020 00:34   Treatments: Unasyn  Discharge Exam: Blood pressure (!) 160/61, pulse 82, temperature 98.2 F (36.8 C), temperature source Oral, resp. rate 20, height 5' (1.524 m), weight 74.5 kg, SpO2 95 %. General appearance: alert and cooperative Resp: clear to auscultation bilaterally Cardio: regular rate and rhythm, S1, S2 normal, no murmur, click, rub or gallop GI: soft, non-tender; bowel sounds normal; no masses,  no organomegaly Extremities: extremities normal, atraumatic, no cyanosis or edema  Disposition: Discharge disposition: 01-Home or Self Care       Discharge Instructions    Diet - low  sodium heart healthy   Complete by: As directed    Increase activity slowly   Complete by: As directed      Allergies as of 03/08/2020      Reactions   Penicillins Other (See Comments)   Reaction as a child, patient does not recall Has patient had a PCN reaction causing immediate rash, facial/tongue/throat swelling, SOB or lightheadedness with hypotension: unknown Has patient had a PCN reaction causing severe rash involving mucus membranes or skin necrosis: unknown Has patient had a PCN reaction that required hospitalization: unknown Has patient had a PCN reaction occurring within the last 10 years: No If all of the above answers are "NO", then may proceed with Cephalosporin use.   Sulfa Antibiotics Nausea Only      Medication List    STOP taking these medications   docusate sodium 100 MG capsule Commonly known as: COLACE     TAKE these medications   acetaminophen 500 MG tablet Commonly known as: TYLENOL Take 1,000 mg by mouth every 6 (six) hours as needed for mild pain or moderate pain.   amLODipine 5 MG tablet Commonly known as: NORVASC Take 5 mg by mouth daily.   amoxicillin-clavulanate 875-125 MG tablet Commonly known as: AUGMENTIN Take 1 tablet by mouth every 12 (twelve) hours for 7 days.   escitalopram 20 MG tablet Commonly known as: LEXAPRO Take 1 tablet (20 mg total) by mouth daily.   fluticasone 50 MCG/ACT nasal spray Commonly known as: FLONASE Place 2 sprays into both nostrils 2 (two) times daily as needed for allergies or rhinitis.   HYDROcodone-acetaminophen 5-325 MG tablet Commonly known as: NORCO/VICODIN Take 1 tablet by mouth every 6 (six) hours as needed for moderate pain.   ibuprofen 200 MG tablet Commonly known as: ADVIL Take 800 mg by mouth every 6 (six) hours as needed for headache or moderate pain.   lactulose 20 g packet Commonly known as: CEPHULAC Take 1 packet (20 g total) by mouth daily as needed.   mirtazapine 15 MG disintegrating  tablet Commonly known as: REMERON SOL-TAB Take 15 mg by mouth at bedtime.   ondansetron 4 MG disintegrating tablet Commonly known as: Zofran ODT Take 1 tablet (4 mg total) by mouth every 8 (eight) hours as needed for nausea or vomiting.   pantoprazole 40 MG tablet Commonly known as: PROTONIX Take 40 mg by mouth daily.   ranitidine 150 MG tablet Commonly known as: ZANTAC Take 150 mg by mouth 3 (three) times daily as needed for heartburn.   senna-docusate 8.6-50 MG tablet Commonly known as: Senokot-S Take 2 tablets by mouth 2 (two) times daily as needed for mild constipation.   triamcinolone cream 0.1 % Commonly known as: KENALOG Apply 1 application topically daily as needed (rash).      Follow-up Protection, [facility] Follow up in 1 week(s).   Specialty: General Practice Contact information: [address] [place] [address] [phone]           Signed: Sharen Hones 03/08/2020, 9:57 AM

## 2020-09-09 ENCOUNTER — Emergency Department: Payer: Medicare Other

## 2020-09-09 ENCOUNTER — Inpatient Hospital Stay
Admission: EM | Admit: 2020-09-09 | Discharge: 2020-09-21 | DRG: 392 | Disposition: A | Discharge status: Home Health | Payer: Medicare Other | Attending: Internal Medicine | Admitting: Internal Medicine

## 2020-09-09 ENCOUNTER — Other Ambulatory Visit: Payer: Self-pay

## 2020-09-09 DIAGNOSIS — Z880 Allergy status to penicillin: Secondary | ICD-10-CM

## 2020-09-09 DIAGNOSIS — R9431 Abnormal electrocardiogram [ECG] [EKG]: Secondary | ICD-10-CM | POA: Diagnosis present

## 2020-09-09 DIAGNOSIS — R112 Nausea with vomiting, unspecified: Secondary | ICD-10-CM

## 2020-09-09 DIAGNOSIS — K5792 Diverticulitis of intestine, part unspecified, without perforation or abscess without bleeding: Secondary | ICD-10-CM | POA: Diagnosis not present

## 2020-09-09 DIAGNOSIS — Z20822 Contact with and (suspected) exposure to covid-19: Secondary | ICD-10-CM | POA: Diagnosis present

## 2020-09-09 DIAGNOSIS — Z87440 Personal history of urinary (tract) infections: Secondary | ICD-10-CM

## 2020-09-09 DIAGNOSIS — R079 Chest pain, unspecified: Secondary | ICD-10-CM | POA: Diagnosis present

## 2020-09-09 DIAGNOSIS — F419 Anxiety disorder, unspecified: Secondary | ICD-10-CM | POA: Diagnosis present

## 2020-09-09 DIAGNOSIS — K5732 Diverticulitis of large intestine without perforation or abscess without bleeding: Principal | ICD-10-CM | POA: Diagnosis present

## 2020-09-09 DIAGNOSIS — I10 Essential (primary) hypertension: Secondary | ICD-10-CM | POA: Diagnosis present

## 2020-09-09 DIAGNOSIS — K5900 Constipation, unspecified: Secondary | ICD-10-CM | POA: Diagnosis present

## 2020-09-09 DIAGNOSIS — R3915 Urgency of urination: Secondary | ICD-10-CM | POA: Diagnosis present

## 2020-09-09 DIAGNOSIS — H9190 Unspecified hearing loss, unspecified ear: Secondary | ICD-10-CM | POA: Diagnosis present

## 2020-09-09 DIAGNOSIS — Z90710 Acquired absence of both cervix and uterus: Secondary | ICD-10-CM

## 2020-09-09 DIAGNOSIS — Z882 Allergy status to sulfonamides status: Secondary | ICD-10-CM

## 2020-09-09 DIAGNOSIS — Z66 Do not resuscitate: Secondary | ICD-10-CM | POA: Diagnosis present

## 2020-09-09 DIAGNOSIS — Z8719 Personal history of other diseases of the digestive system: Secondary | ICD-10-CM

## 2020-09-09 DIAGNOSIS — Z6835 Body mass index (BMI) 35.0-35.9, adult: Secondary | ICD-10-CM

## 2020-09-09 DIAGNOSIS — E669 Obesity, unspecified: Secondary | ICD-10-CM | POA: Diagnosis present

## 2020-09-09 DIAGNOSIS — E876 Hypokalemia: Secondary | ICD-10-CM | POA: Diagnosis present

## 2020-09-09 DIAGNOSIS — R638 Other symptoms and signs concerning food and fluid intake: Secondary | ICD-10-CM

## 2020-09-09 DIAGNOSIS — Z8249 Family history of ischemic heart disease and other diseases of the circulatory system: Secondary | ICD-10-CM

## 2020-09-09 DIAGNOSIS — K219 Gastro-esophageal reflux disease without esophagitis: Secondary | ICD-10-CM | POA: Diagnosis not present

## 2020-09-09 DIAGNOSIS — R531 Weakness: Secondary | ICD-10-CM

## 2020-09-09 DIAGNOSIS — R350 Frequency of micturition: Secondary | ICD-10-CM | POA: Diagnosis present

## 2020-09-09 DIAGNOSIS — F32A Depression, unspecified: Secondary | ICD-10-CM | POA: Diagnosis present

## 2020-09-09 DIAGNOSIS — R6881 Early satiety: Secondary | ICD-10-CM

## 2020-09-09 DIAGNOSIS — Z79899 Other long term (current) drug therapy: Secondary | ICD-10-CM

## 2020-09-09 LAB — COMPREHENSIVE METABOLIC PANEL
ALT: 12 U/L (ref 0–44)
AST: 18 U/L (ref 15–41)
Albumin: 3.7 g/dL (ref 3.5–5.0)
Alkaline Phosphatase: 83 U/L (ref 38–126)
Anion gap: 9 (ref 5–15)
BUN: 9 mg/dL (ref 8–23)
CO2: 25 mmol/L (ref 22–32)
Calcium: 8.6 mg/dL — ABNORMAL LOW (ref 8.9–10.3)
Chloride: 103 mmol/L (ref 98–111)
Creatinine, Ser: 0.92 mg/dL (ref 0.44–1.00)
GFR, Estimated: >60 mL/min (ref >60)
Glucose, Bld: 123 mg/dL — ABNORMAL HIGH (ref 70–99)
Potassium: 3.5 mmol/L (ref 3.5–5.1)
Sodium: 137 mmol/L (ref 135–145)
Total Bilirubin: 1 mg/dL (ref 0.3–1.2)
Total Protein: 7 g/dL (ref 6.5–8.1)

## 2020-09-09 LAB — CBC
HCT: 45 % (ref 36.0–46.0)
Hemoglobin: 15.2 g/dL — ABNORMAL HIGH (ref 12.0–15.0)
MCH: 30.2 pg (ref 26.0–34.0)
MCHC: 33.8 g/dL (ref 30.0–36.0)
MCV: 89.3 fL (ref 80.0–100.0)
Platelets: 338 10*3/uL (ref 150–400)
RBC: 5.04 MIL/uL (ref 3.87–5.11)
RDW: 13.8 % (ref 11.5–15.5)
WBC: 12.1 10*3/uL — ABNORMAL HIGH (ref 4.0–10.5)
nRBC: 0 % (ref 0.0–0.2)

## 2020-09-09 LAB — URINALYSIS, COMPLETE (UACMP) WITH MICROSCOPIC
Bacteria, UA: NONE SEEN
Bilirubin Urine: NEGATIVE
Glucose, UA: NEGATIVE mg/dL
Hgb urine dipstick: NEGATIVE
Ketones, ur: NEGATIVE mg/dL
Nitrite: NEGATIVE
Protein, ur: NEGATIVE mg/dL
Specific Gravity, Urine: 1.012 (ref 1.005–1.030)
pH: 8 (ref 5.0–8.0)

## 2020-09-09 LAB — LIPASE, BLOOD: Lipase: 25 U/L (ref 11–51)

## 2020-09-09 LAB — LACTIC ACID, PLASMA
Lactic Acid, Venous: 1.3 mmol/L (ref 0.5–1.9)
Lactic Acid, Venous: 1.1 mmol/L (ref 0.5–1.9)

## 2020-09-09 LAB — RESPIRATORY PANEL BY RT PCR (FLU A&B, COVID)
Influenza A by PCR: NEGATIVE
Influenza B by PCR: NEGATIVE
SARS Coronavirus 2 by RT PCR: NEGATIVE

## 2020-09-09 MED ORDER — ONDANSETRON HCL 4 MG/2ML IJ SOLN
4.0000 mg | Freq: Once | INTRAMUSCULAR | Status: AC
  Administered 2020-09-09: 4 mg via INTRAVENOUS
  Filled 2020-09-09: qty 2

## 2020-09-09 MED ORDER — LORAZEPAM 0.5 MG PO TABS
0.5000 mg | ORAL_TABLET | Freq: Two times a day (BID) | ORAL | Status: DC | PRN
  Administered 2020-09-18 – 2020-09-20 (×5): 0.5 mg via ORAL
  Filled 2020-09-09 (×5): qty 1

## 2020-09-09 MED ORDER — ENOXAPARIN SODIUM 40 MG/0.4ML ~~LOC~~ SOLN
0.5000 mg/kg | SUBCUTANEOUS | Status: DC
  Administered 2020-09-09 – 2020-09-20 (×12): 40 mg via SUBCUTANEOUS
  Filled 2020-09-09 (×12): qty 0.4

## 2020-09-09 MED ORDER — FLUTICASONE PROPIONATE 50 MCG/ACT NA SUSP
2.0000 | Freq: Two times a day (BID) | NASAL | Status: DC | PRN
  Administered 2020-09-17 – 2020-09-20 (×2): 2 via NASAL
  Filled 2020-09-09 (×2): qty 16

## 2020-09-09 MED ORDER — HYDROXYZINE HCL 50 MG/ML IM SOLN
25.0000 mg | Freq: Four times a day (QID) | INTRAMUSCULAR | Status: DC | PRN
  Administered 2020-09-09 – 2020-09-20 (×5): 25 mg via INTRAMUSCULAR
  Filled 2020-09-09 (×9): qty 0.5

## 2020-09-09 MED ORDER — METRONIDAZOLE IN NACL 5-0.79 MG/ML-% IV SOLN
500.0000 mg | Freq: Once | INTRAVENOUS | Status: AC
  Administered 2020-09-09: 500 mg via INTRAVENOUS
  Filled 2020-09-09: qty 100

## 2020-09-09 MED ORDER — ESCITALOPRAM OXALATE 10 MG PO TABS
20.0000 mg | ORAL_TABLET | Freq: Every day | ORAL | Status: DC
  Administered 2020-09-11 – 2020-09-21 (×11): 20 mg via ORAL
  Filled 2020-09-09 (×13): qty 2

## 2020-09-09 MED ORDER — HYDRALAZINE HCL 25 MG PO TABS: 25.0000 mg | ORAL_TABLET | Freq: Three times a day (TID) | ORAL | Status: DC | PRN

## 2020-09-09 MED ORDER — FENTANYL CITRATE (PF) 100 MCG/2ML IJ SOLN: 50.0000 ug | Freq: Once | INTRAMUSCULAR | Status: DC

## 2020-09-09 MED ORDER — SODIUM CHLORIDE 0.9 % IV SOLN
2.0000 g | INTRAVENOUS | Status: DC
  Administered 2020-09-10 – 2020-09-12 (×3): 2 g via INTRAVENOUS
  Filled 2020-09-09 (×2): qty 2
  Filled 2020-09-09: qty 20

## 2020-09-09 MED ORDER — AMLODIPINE BESYLATE 5 MG PO TABS
5.0000 mg | ORAL_TABLET | Freq: Once | ORAL | Status: AC
  Administered 2020-09-09: 5 mg via ORAL
  Filled 2020-09-09: qty 1

## 2020-09-09 MED ORDER — ACETAMINOPHEN 325 MG PO TABS: 650.0000 mg | ORAL_TABLET | Freq: Four times a day (QID) | ORAL | Status: DC | PRN

## 2020-09-09 MED ORDER — PANTOPRAZOLE SODIUM 40 MG PO TBEC
40.0000 mg | DELAYED_RELEASE_TABLET | Freq: Every day | ORAL | Status: DC
  Administered 2020-09-09 – 2020-09-19 (×10): 40 mg via ORAL
  Filled 2020-09-09 (×11): qty 1

## 2020-09-09 MED ORDER — SODIUM CHLORIDE 0.9 % IV SOLN
INTRAVENOUS | Status: DC | PRN
  Administered 2020-09-09 – 2020-09-15 (×4): 250 mL via INTRAVENOUS
  Administered 2020-09-18: 20 mL via INTRAVENOUS

## 2020-09-09 MED ORDER — AMLODIPINE BESYLATE 5 MG PO TABS
5.0000 mg | ORAL_TABLET | Freq: Every day | ORAL | Status: DC
  Administered 2020-09-09 – 2020-09-21 (×12): 5 mg via ORAL
  Filled 2020-09-09 (×13): qty 1

## 2020-09-09 MED ORDER — FENTANYL CITRATE (PF) 100 MCG/2ML IJ SOLN
25.0000 ug | Freq: Once | INTRAMUSCULAR | Status: AC
  Administered 2020-09-09: 25 ug via INTRAVENOUS
  Filled 2020-09-09: qty 2

## 2020-09-09 MED ORDER — DOCUSATE SODIUM 100 MG PO CAPS
200.0000 mg | ORAL_CAPSULE | Freq: Two times a day (BID) | ORAL | Status: DC | PRN
  Filled 2020-09-09: qty 2

## 2020-09-09 MED ORDER — ENOXAPARIN SODIUM 40 MG/0.4ML ~~LOC~~ SOLN: 40.0000 mg | SUBCUTANEOUS | Status: DC

## 2020-09-09 MED ORDER — IOHEXOL 300 MG/ML  SOLN
100.0000 mL | Freq: Once | INTRAMUSCULAR | Status: AC | PRN
  Administered 2020-09-09: 100 mL via INTRAVENOUS

## 2020-09-09 MED ORDER — METRONIDAZOLE IN NACL 5-0.79 MG/ML-% IV SOLN
500.0000 mg | Freq: Three times a day (TID) | INTRAVENOUS | Status: DC
  Administered 2020-09-09 – 2020-09-11 (×6): 500 mg via INTRAVENOUS
  Filled 2020-09-09 (×8): qty 100

## 2020-09-09 MED ORDER — MORPHINE SULFATE (PF) 2 MG/ML IV SOLN
0.5000 mg | INTRAVENOUS | Status: DC | PRN
  Administered 2020-09-09 – 2020-09-11 (×5): 0.5 mg via INTRAVENOUS
  Filled 2020-09-09 (×5): qty 1

## 2020-09-09 MED ORDER — SODIUM CHLORIDE 0.9 % IV BOLUS
1000.0000 mL | Freq: Once | INTRAVENOUS | Status: AC
  Administered 2020-09-09: 1000 mL via INTRAVENOUS

## 2020-09-09 MED ORDER — HYDROXYZINE HCL 10 MG PO TABS
10.0000 mg | ORAL_TABLET | Freq: Three times a day (TID) | ORAL | Status: DC | PRN
  Filled 2020-09-09 (×2): qty 1

## 2020-09-09 MED ORDER — LACTULOSE 10 GM/15ML PO SOLN
20.0000 g | Freq: Every day | ORAL | Status: DC
  Administered 2020-09-09 – 2020-09-18 (×4): 20 g via ORAL
  Filled 2020-09-09 (×11): qty 30

## 2020-09-09 MED ORDER — PROMETHAZINE HCL 25 MG/ML IJ SOLN
6.2500 mg | Freq: Once | INTRAMUSCULAR | Status: AC
  Administered 2020-09-09: 6.25 mg via INTRAVENOUS
  Filled 2020-09-09: qty 1

## 2020-09-09 MED ORDER — OXYCODONE-ACETAMINOPHEN 5-325 MG PO TABS
1.0000 | ORAL_TABLET | ORAL | Status: DC | PRN
  Administered 2020-09-09 – 2020-09-21 (×19): 1 via ORAL
  Filled 2020-09-09 (×20): qty 1

## 2020-09-09 MED ORDER — SODIUM CHLORIDE 0.9 % IV SOLN
2.0000 g | Freq: Once | INTRAVENOUS | Status: AC
  Administered 2020-09-09: 2 g via INTRAVENOUS
  Filled 2020-09-09: qty 20

## 2020-09-09 NOTE — ED Provider Notes (Signed)
[facility] Emergency Department Provider Note  ____________________________________________   First MD Initiated Contact with Patient 09/09/20 [phone]     (approximate)  I have reviewed the triage vital signs and the nursing notes.   HISTORY  Chief Complaint Abdominal Pain and Back Pain    HPI Marilyn Andrews is a 84 y.o. female here with fever, fatigue, generalized weakness.  Patient states that her symptoms started approximately 2 weeks ago, with aching, gnawing, lower abdominal pain.  She had associated urinary frequency and urgency.  Since then, she has had persistent pain that has been gradually worsening.  Over the last 24 to 48 hours, her symptoms have worsened.  She has been "so sick", nauseous, and had difficulty eating and drinking.  She has been weak and had difficulty getting around the house.  She states that her symptoms feel similar to her previous UTIs as well as previous episodes of diverticulitis.  She denies any cough.  No recent medication changes.  No known Covid exposures.  No other acute complaints.        Past Medical History:  Diagnosis Date  . Anxiety   . Arthritis   . Depression   . Diverticulitis   . Dyspnea   . Edema    FEET/LEGS  . HOH (hard of hearing)   . Hypertension     Patient Active Problem List   Diagnosis Date Noted  . GERD (gastroesophageal reflux disease) 09/09/2020  . Depression 09/09/2020  . HTN (hypertension) 03/05/2020  . UTI (urinary tract infection) 07/28/2018  . Severe recurrent major depression without psychotic features (HCC) 03/08/2018  . Acute diverticulitis 03/07/2018  . Diverticulitis 07/01/2016    Past Surgical History:  Procedure Laterality Date  . ABDOMINAL HYSTERECTOMY    . APPENDECTOMY    . BACK SURGERY    . CATARACT EXTRACTION W/PHACO Right 08/30/2018   Procedure: CATARACT EXTRACTION PHACO AND INTRAOCULAR LENS PLACEMENT (IOC);  Surgeon: Galen Manila, MD;  Location: [facility] ORS;   Service: Ophthalmology;  Laterality: Right;  [place] 01:07.1 CDE 13.66 Fluid Pack Lot # [id number]  . CATARACT EXTRACTION W/PHACO Left 09/20/2018   Procedure: CATARACT EXTRACTION PHACO AND INTRAOCULAR LENS PLACEMENT (IOC);  Surgeon: Galen Manila, MD;  Location: [facility] ORS;  Service: Ophthalmology;  Laterality: Left;  [place] 01:09 CDE 11.45 fluid pack lot # [id number]  . CHOLECYSTECTOMY    . HYSTERECTOMY ABDOMINAL WITH SALPINGECTOMY      Prior to Admission medications   Medication Sig Start Date End Date Taking? Authorizing Provider  docusate sodium (COLACE) 100 MG capsule Take 200 mg by mouth 2 (two) times daily as needed for mild constipation.   Yes [provider]  fluticasone (FLONASE) 50 MCG/ACT nasal spray Place 2 sprays into both nostrils 2 (two) times daily as needed for allergies or rhinitis.    Yes [provider]  LORazepam (ATIVAN) 0.5 MG tablet Take 0.5 mg by mouth 2 (two) times daily as needed. 07/22/20  Yes [provider]  promethazine (PHENERGAN) 25 MG tablet Take 12-25 mg by mouth every 6 (six) hours as needed for nausea or vomiting.   Yes [provider]  acetaminophen (TYLENOL) 500 MG tablet Take 1,000 mg by mouth every 6 (six) hours as needed for mild pain or moderate pain.     [provider]  amLODipine (NORVASC) 5 MG tablet Take 5 mg by mouth daily.    [provider]  escitalopram (LEXAPRO) 20 MG tablet Take 1 tablet (20 mg total) by  mouth daily. 03/14/18   Katha Hamming, MD  HYDROcodone-acetaminophen (NORCO/VICODIN) 5-325 MG tablet Take 1 tablet by mouth every 6 (six) hours as needed for moderate pain. Patient not taking: Reported on 09/09/2020    [provider]  ibuprofen (ADVIL,MOTRIN) 200 MG tablet Take 800 mg by mouth every 6 (six) hours as needed for headache or moderate pain.    [provider]  lactulose (CEPHULAC) 20 g packet Take 1 packet (20 g total) by mouth daily as needed. 03/08/20   Marrion Coy, MD  mirtazapine (REMERON SOL-TAB) 15 MG disintegrating tablet Take 15 mg by mouth at bedtime. Patient not taking: Reported on 09/09/2020    [provider]  ondansetron (ZOFRAN ODT) 4 MG disintegrating tablet Take 1 tablet (4 mg total) by mouth every 8 (eight) hours as needed for nausea or vomiting. 07/29/18   Auburn Bilberry, MD  pantoprazole (PROTONIX) 40 MG tablet Take 40 mg by mouth daily. Patient not taking: Reported on 09/09/2020    [provider]  ranitidine (ZANTAC) 150 MG tablet Take 150 mg by mouth 3 (three) times daily as needed for heartburn.  Patient not taking: Reported on 09/09/2020    [provider]  senna-docusate (SENOKOT-S) 8.6-50 MG tablet Take 2 tablets by mouth 2 (two) times daily as needed for mild constipation. 03/08/20   Marrion Coy, MD  triamcinolone cream (KENALOG) 0.1 % Apply 1 application topically daily as needed (rash).     [provider]    Allergies Penicillins and Sulfa antibiotics  Family History  Problem Relation Age of Onset  . Hypertension Father   . CAD Brother     Social History Social History   Tobacco Use  . Smoking status: Never Smoker  . Smokeless tobacco: Never Used  Vaping Use  . Vaping Use: Never used  Substance Use Topics  . Alcohol use: No  . Drug use: No    Review of Systems  Review of Systems  Constitutional: Positive for fatigue. Negative for fever.  HENT: Negative for congestion and sore throat.   Eyes: Negative for visual disturbance.  Respiratory: Negative for cough and shortness of breath.   Cardiovascular: Negative for chest pain.  Gastrointestinal: Positive for abdominal pain, diarrhea and nausea. Negative for vomiting.  Genitourinary: Positive for dysuria and frequency. Negative for flank pain.  Musculoskeletal: Negative for back pain and neck pain.  Skin: Negative for rash and wound.  Neurological: Positive for weakness.  All other systems reviewed and are negative.     ____________________________________________  PHYSICAL EXAM:      VITAL SIGNS: ED Triage Vitals  Enc Vitals Group     BP 09/09/20 0853 140/63     Pulse Rate 09/09/20 0850 78     Resp 09/09/20 0850 18     Temp 09/09/20 0850 98.9 F (37.2 C)     Temp Source 09/09/20 0850 Oral     SpO2 09/09/20 0853 93 %     Weight 09/09/20 0851 180 lb (81.6 kg)     Height 09/09/20 0851 5' (1.524 m)     Head Circumference --      Peak Flow --      Pain Score 09/09/20 0851 10     Pain Loc --      Pain Edu? --      Excl. in GC? --      Physical Exam Vitals and nursing note reviewed.  Constitutional:      General: She is not in acute distress.  Appearance: She is well-developed.  HENT:     Head: Normocephalic and atraumatic.  Eyes:     Conjunctiva/sclera: Conjunctivae normal.  Cardiovascular:     Rate and Rhythm: Normal rate and regular rhythm.     Heart sounds: Normal heart sounds. No murmur heard.  No friction rub.  Pulmonary:     Effort: Pulmonary effort is normal. No respiratory distress.     Breath sounds: Normal breath sounds. No wheezing or rales.  Abdominal:     General: There is no distension.     Palpations: Abdomen is soft.     Tenderness: There is abdominal tenderness in the suprapubic area and left lower quadrant. There is no rebound.  Musculoskeletal:     Cervical back: Neck supple.  Skin:    General: Skin is warm.     Capillary Refill: Capillary refill takes less than 2 seconds.  Neurological:     Mental Status: She is alert and oriented to person, place, and time.     Motor: No abnormal muscle tone.       ____________________________________________   LABS (all labs ordered are listed, but only abnormal results are displayed)  Labs Reviewed  COMPREHENSIVE METABOLIC PANEL - Abnormal; Notable for the following components:      Result Value   Glucose, Bld 123 (*)    Calcium 8.6 (*)    All other components within normal limits  CBC - Abnormal; Notable  for the following components:   WBC 12.1 (*)    Hemoglobin 15.2 (*)    All other components within normal limits  URINALYSIS, COMPLETE (UACMP) WITH MICROSCOPIC - Abnormal; Notable for the following components:   Color, Urine YELLOW (*)    APPearance CLEAR (*)    Leukocytes,Ua TRACE (*)    All other components within normal limits  RESPIRATORY PANEL BY RT PCR (FLU A&B, COVID)  CULTURE, BLOOD (SINGLE)  CULTURE, BLOOD (SINGLE)  LIPASE, BLOOD  LACTIC ACID, PLASMA  LACTIC ACID, PLASMA  BASIC METABOLIC PANEL  CBC    ____________________________________________  EKG: Normal sinus rhythm, ventricular rate 77.  PR 184, QRS 66, QTc 509.  Low voltage QRS.  No acute ST elevations or depression.  No EKG evidence of acute ischemia or infarct. ________________________________________  RADIOLOGY All imaging, including plain films, CT scans, and ultrasounds, independently reviewed by me, and interpretations confirmed via formal radiology reads.  ED MD interpretation:   CT A/P: Acute diverticulitis, without abscess or perforation, borderline thickening of urinary bladder  Official radiology report(s): CT ABDOMEN PELVIS W CONTRAST  Result Date: 09/09/2020 CLINICAL DATA:  Abdominal pain and fever.  Dysuria. EXAM: CT ABDOMEN AND PELVIS WITH CONTRAST TECHNIQUE: Multidetector CT imaging of the abdomen and pelvis was performed using the standard protocol following bolus administration of intravenous contrast. CONTRAST:  OMNIPAQUE IOHEXOL 300 MG/ML  SOLN COMPARISON:  Mar 05, 2020 and July 28, 2018 FINDINGS: Lower chest: Stable scarring in the lung bases. No lung base edema or consolidation. Hepatobiliary: There are subcentimeter apparent cysts in the liver, stable. No evident new liver lesions. Gallbladder is absent. There is no appreciable intrahepatic biliary duct dilatation. Prominence of the common bile duct is again noted measuring 11 mm. Common hepatic duct measures 1.3 cm, slightly smaller  compared to previous study. No biliary duct mass or calculus is demonstrable on this study. Pancreas: No pancreatic mass or inflammatory focus. Spleen: No splenic lesions evident. A small accessory spleen anteriorly is stable. Adrenals/Urinary Tract: Adrenals bilaterally appear normal. There is no  evident renal mass or hydronephrosis on either side. No evident renal or ureteral calculus on either side. Urinary bladder is midline. Urinary bladder wall is borderline thickened. Stomach/Bowel: There are multiple sigmoid diverticula. There is soft tissue stranding in the region of the proximal to mid sigmoid colon consistent with a degree of diverticulitis. There is no fluid collection or evidence of abscess in this area of sigmoid diverticulitis no perforation evident. Scattered diverticula noted in the descending colon without inflammation. Elsewhere, no bowel wall thickening or bowel obstruction. Terminal ileum appears normal. There is fatty infiltration in ileocecal valve. No evident free air or portal venous air. Vascular/Lymphatic: No abdominal aortic aneurysm. There is aortic and iliac artery atherosclerosis. Major venous structures appear patent. No evident adenopathy in the abdomen or pelvis. Reproductive: The uterus is absent.  No evident pelvic mass. Other: Appendix absent. No periappendiceal region inflammatory type change. No abscess or ascites is evident in the abdomen or pelvis. Slight fat in the umbilicus noted. Musculoskeletal: Degenerative change noted in the lumbar spine. There is a degree of spinal stenosis at L3-4 due to bony hypertrophy and disc protrusion. No intramuscular or abdominal wall lesions are evident. IMPRESSION: 1. Diverticulitis involving the proximal to mid sigmoid colon with the inflammatory changes noted in the upper left pelvis extending to the midline. No fluid collection or abscess within the area of diverticulitis. No evident perforation. Sigmoid diverticulosis noted more  distally in the sigmoid colon as well as in the descending colon. 2. Borderline thickening of the urinary bladder which could indicate a degree of cystitis. Correlation with urinalysis in this regard advised. 3. No bowel obstruction. No abscess in the abdomen or pelvis. Appendix absent. 4. No hydronephrosis on either side. No renal or ureteral calculus on either side. 5. Gallbladder absent. Biliary duct dilatation noted, slightly less than on prior study. No obstructing focus seen in the biliary ductal system by CT. 6. Spinal stenosis at L3-4 due to bony hypertrophy and disc protrusion. 7. Aortic Atherosclerosis (ICD10-I70.0). There also foci of iliac artery atherosclerotic calcification. 8.  Uterus absent. Electronically Signed   By: Bretta Bang III M.D.   On: 09/09/2020 11:13    ____________________________________________  PROCEDURES   Procedure(s) performed (including Critical Care):  Procedures  ____________________________________________  INITIAL IMPRESSION / MDM / ASSESSMENT AND PLAN / ED COURSE  As part of my medical decision making, I reviewed the following data within the electronic MEDICAL RECORD NUMBER Nursing notes reviewed and incorporated, Old chart reviewed, Notes from prior ED visits, and [place] Controlled Substance Database       *Marilyn Andrews was evaluated in Emergency Department on 09/09/2020 for the symptoms described in the history of present illness. She was evaluated in the context of the global COVID-19 pandemic, which necessitated consideration that the patient might be at risk for infection with the SARS-CoV-2 virus that causes COVID-19. Institutional protocols and algorithms that pertain to the evaluation of patients at risk for COVID-19 are in a state of rapid change based on information released by regulatory bodies including the CDC and federal and state organizations. These policies and algorithms were followed during the patient's care in the ED.  Some ED  evaluations and interventions may be delayed as a result of limited staffing during the pandemic.*     Medical Decision Making:  84 yo F here with abdominal pain, nausea, vomiting. History and exam is concerning for diverticulitis vs UTI. Labs, imaging reviewed. Labs show moderate leukocytosis, otherwise unremarkable. CMP unremarkable. CT scan  shows acute diverticulitis, without perforation or surgical complications. Will start IV ABX, fluids, admit for pain control and treatment.  ____________________________________________  FINAL CLINICAL IMPRESSION(S) / ED DIAGNOSES  Final diagnoses:  Acute diverticulitis     MEDICATIONS GIVEN DURING THIS VISIT:  Medications  oxyCODONE-acetaminophen (PERCOCET/ROXICET) 5-325 MG per tablet 1 tablet (1 tablet Oral Given 09/09/20 1851)  morphine 2 MG/ML injection 0.5 mg (0.5 mg Intravenous Given 09/09/20 1620)  cefTRIAXone (ROCEPHIN) 2 g in sodium chloride 0.9 % 100 mL IVPB (has no administration in time range)  metroNIDAZOLE (FLAGYL) IVPB 500 mg (500 mg Intravenous New Bag/Given 09/09/20 1841)  hydrALAZINE (APRESOLINE) tablet 25 mg (has no administration in time range)  hydrOXYzine (VISTARIL) injection 25 mg (25 mg Intramuscular Given 09/09/20 1741)  hydrOXYzine (ATARAX/VISTARIL) tablet 10 mg (has no administration in time range)  acetaminophen (TYLENOL) tablet 650 mg (has no administration in time range)  enoxaparin (LOVENOX) injection 40 mg (has no administration in time range)  amLODipine (NORVASC) tablet 5 mg (5 mg Oral Given 09/09/20 1841)  escitalopram (LEXAPRO) tablet 20 mg (0 mg Oral Hold 09/09/20 1841)  LORazepam (ATIVAN) tablet 0.5 mg (has no administration in time range)  docusate sodium (COLACE) capsule 200 mg (has no administration in time range)  lactulose (CHRONULAC) 10 GM/15ML solution 20 g (20 g Oral Given 09/09/20 1841)  pantoprazole (PROTONIX) EC tablet 40 mg (40 mg Oral Given 09/09/20 1842)  fluticasone (FLONASE) 50 MCG/ACT nasal spray 2  spray (has no administration in time range)  0.9 %  sodium chloride infusion (250 mLs Intravenous New Bag/Given 09/09/20 1840)  sodium chloride 0.9 % bolus 1,000 mL (0 mLs Intravenous Stopped 09/09/20 1140)  ondansetron (ZOFRAN) injection 4 mg (4 mg Intravenous Given 09/09/20 1034)  fentaNYL (SUBLIMAZE) injection 25 mcg (25 mcg Intravenous Given 09/09/20 1035)  iohexol (OMNIPAQUE) 300 MG/ML solution 100 mL (100 mLs Intravenous Contrast Given 09/09/20 1049)  cefTRIAXone (ROCEPHIN) 2 g in sodium chloride 0.9 % 100 mL IVPB (0 g Intravenous Stopped 09/09/20 1233)  metroNIDAZOLE (FLAGYL) IVPB 500 mg (0 mg Intravenous Stopped 09/09/20 1455)  promethazine (PHENERGAN) injection 6.25 mg (6.25 mg Intravenous Given 09/09/20 1146)  fentaNYL (SUBLIMAZE) injection 25 mcg (25 mcg Intravenous Given 09/09/20 1149)  amLODipine (NORVASC) tablet 5 mg (5 mg Oral Given 09/09/20 1228)     ED Discharge Orders    None       Note:  This document was prepared using Dragon voice recognition software and may include unintentional dictation errors.   Shaune PollackIsaacs, Jamelia Varano, MD 09/09/20 2046

## 2020-09-09 NOTE — Progress Notes (Signed)
PHARMACIST - PHYSICIAN COMMUNICATION  CONCERNING:  Enoxaparin (Lovenox) for DVT Prophylaxis    RECOMMENDATION: Patient was prescribed enoxaprin 40mg  q24 hours for VTE prophylaxis.   Filed Weights   09/09/20 0851  Weight: 81.6 kg (180 lb)    Body mass index is 35.15 kg/m.  Estimated Creatinine Clearance: 44.6 mL/min (by C-G formula based on SCr of 0.92 mg/dL).   Based on [facility] policy patient is candidate for enoxaparin 0.5mg /kg TBW SQ every 24 hours based on BMI being >30.   DESCRIPTION: Pharmacy has adjusted enoxaparin dose per [facility] policy.  Patient is now receiving enoxaparin 0.5 mg/kg (40 mg rounded) every 24 hours    [facility], PharmD Clinical Pharmacist  09/09/2020 5:09 PM

## 2020-09-09 NOTE — H&P (Signed)
History and Physical    Marilyn Millerseggy O Allcock ONG:[id number] DOB: [birth year] DOA: 09/09/2020  Referring MD/NP/PA:   [facility]P: [facility]   Patient coming from:  The patient is coming from home.  At baseline, pt is independent for most of ADL.        Chief Complaint: Abdominal pain  HPI: Marilyn Andrews is a 84 y.o. female with medical history significant of hypertension, GERD, depression, anxiety, hard of hearing, diverticulitis, who presents with abdominal pain.  Patient states that she has abdominal pain which has been going on for almost 2 weeks, and progressively worsening.  Her abdominal pain is located in the bilateral lower abdomen, constant, sharp, moderate, nonradiating.  She also has nausea and mild vomiting for few times, no diarrhea.  Patient states that she is mildly constipated.  No fever or chills.  Patient does not have chest pain, cough, shortness breath, symptoms of UTI or unilateral weakness.  Patient has a burning on urination, no dysuria or hematuria.  ED Course: pt was found to have WBC 12.1, negative urinalysis, pending COVID-19 PCR, lactic acid 1.3, electrolytes renal function okay, temperature normal, blood pressure 193/76, heart rate 73, RR 18, oxygen saturation 93--94% on room air.  CT abdomen/pelvis that showed acute diverticulitis without abscess formation.  Patient is placed on MedSurg bed for observation   Review of Systems:   General: no fevers, chills, no body weight gain, has poor appetite, has fatigue HEENT: no blurry vision, hearing changes or sore throat Respiratory: no dyspnea, coughing, wheezing CV: no chest pain, no palpitations GI: has nausea, vomiting, abdominal pain, constipation, no diarrhea GU: no dysuria, burning on urination, increased urinary frequency, hematuria  Ext: no leg edema Neuro: no unilateral weakness, numbness, or tingling, no vision change. Has hearing loss Skin: no rash, no skin tear. MSK: No muscle spasm, no deformity, no  limitation of range of movement in spin Heme: No easy bruising.  Travel history: No recent long distant travel.  Allergy:  Allergies  Allergen Reactions  . Penicillins Other (See Comments)    Reaction as a child, patient does not recall  Has patient had a PCN reaction causing immediate rash, facial/tongue/throat swelling, SOB or lightheadedness with hypotension: unknown Has patient had a PCN reaction causing severe rash involving mucus membranes or skin necrosis: unknown Has patient had a PCN reaction that required hospitalization: unknown Has patient had a PCN reaction occurring within the last 10 years: No If all of the above answers are "NO", then may proceed with Cephalosporin use.  . Sulfa Antibiotics Nausea Only    Past Medical History:  Diagnosis Date  . Anxiety   . Arthritis   . Depression   . Diverticulitis   . Dyspnea   . Edema    FEET/LEGS  . HOH (hard of hearing)   . Hypertension     Past Surgical History:  Procedure Laterality Date  . ABDOMINAL HYSTERECTOMY    . APPENDECTOMY    . BACK SURGERY    . CATARACT EXTRACTION W/PHACO Right 08/30/2018   Procedure: CATARACT EXTRACTION PHACO AND INTRAOCULAR LENS PLACEMENT (IOC);  Surgeon: Galen ManilaPorfilio, William, MD;  Location: [facility] ORS;  Service: Ophthalmology;  Laterality: Right;  US 01:07.1 CDE 13.66 Fluid Pack Lot # [id number]  . CATARACT EXTRACTION W/PHACO Left 09/20/2018   Procedure: CATARACT EXTRACTION PHACO AND INTRAOCULAR LENS PLACEMENT (IOC);  Surgeon: Galen ManilaPorfilio, William, MD;  Location: [facility] ORS;  Service: Ophthalmology;  Laterality: Left;  US 01:09 CDE 11.45 fluid pack lot #  [id number]  . CHOLECYSTECTOMY    . HYSTERECTOMY ABDOMINAL WITH SALPINGECTOMY      Social History:  reports that she has never smoked. She has never used smokeless tobacco. She reports that she does not drink alcohol and does not use drugs.  Family History:  Family History  Problem Relation Age of Onset  . Hypertension Father   . CAD Brother       Prior to Admission medications   Medication Sig Start Date End Date Taking? Authorizing Provider  acetaminophen (TYLENOL) 500 MG tablet Take 1,000 mg by mouth every 6 (six) hours as needed for mild pain or moderate pain.     [provider]  amLODipine (NORVASC) 5 MG tablet Take 5 mg by mouth daily.    [provider]  escitalopram (LEXAPRO) 20 MG tablet Take 1 tablet (20 mg total) by mouth daily. 03/14/18   Katha Hamming, MD  fluticasone (FLONASE) 50 MCG/ACT nasal spray Place 2 sprays into both nostrils 2 (two) times daily as needed for allergies or rhinitis.     [provider]  HYDROcodone-acetaminophen (NORCO/VICODIN) 5-325 MG tablet Take 1 tablet by mouth every 6 (six) hours as needed for moderate pain.    [provider]  ibuprofen (ADVIL,MOTRIN) 200 MG tablet Take 800 mg by mouth every 6 (six) hours as needed for headache or moderate pain.    [provider]  lactulose (CEPHULAC) 20 g packet Take 1 packet (20 g total) by mouth daily as needed. 03/08/20   Marrion Coy, MD  mirtazapine (REMERON SOL-TAB) 15 MG disintegrating tablet Take 15 mg by mouth at bedtime.    [provider]  ondansetron (ZOFRAN ODT) 4 MG disintegrating tablet Take 1 tablet (4 mg total) by mouth every 8 (eight) hours as needed for nausea or vomiting. 07/29/18   Auburn Bilberry, MD  pantoprazole (PROTONIX) 40 MG tablet Take 40 mg by mouth daily.    [provider]  ranitidine (ZANTAC) 150 MG tablet Take 150 mg by mouth 3 (three) times daily as needed for heartburn.     [provider]  senna-docusate (SENOKOT-S) 8.6-50 MG tablet Take 2 tablets by mouth 2 (two) times daily as needed for mild constipation. 03/08/20   Marrion Coy, MD  triamcinolone cream (KENALOG) 0.1 % Apply 1 application topically daily as needed (rash).     [provider]    Physical Exam: Vitals:   09/09/20 1200 09/09/20 1228 09/09/20 1230 09/09/20 1300  BP: (!)  193/76 (!) 193/76 (!) 203/91 (!) 178/76  Pulse: 77  74 63  Resp:      Temp:      TempSrc:      SpO2: 94%  95% 94%  Weight:      Height:       General: Not in acute distress HEENT:       Eyes: PERRL, EOMI, no scleral icterus.       ENT: No discharge from the ears and nose, no pharynx injection, no tonsillar enlargement.        Neck: No JVD, no bruit, no mass felt. Heme: No neck lymph node enlargement. Cardiac: S1/S2, RRR, No murmurs, No gallops or rubs. Respiratory:  No rales, wheezing, rhonchi or rubs. GI: Soft, nondistended, has tenderness in bilateral lower abdomen, no rebound pain, no organomegaly, BS present. GU: No hematuria Ext: No pitting leg edema bilaterally. 2+DP/PT pulse bilaterally. Musculoskeletal: No joint deformities, No joint redness or warmth, no limitation of ROM in spin. Skin: No rashes.  Neuro: Alert, oriented X3, cranial nerves II-XII grossly intact, moves all extremities normally. Psych: Patient is not psychotic, no suicidal or hemocidal ideation.  Labs on Admission: I have personally reviewed following labs and imaging studies  CBC: Recent Labs  Lab 09/09/20 0854  WBC 12.1*  HGB 15.2*  HCT 45.0  MCV 89.3  PLT 338   Basic Metabolic Panel: Recent Labs  Lab 09/09/20 0854  NA 137  K 3.5  CL 103  CO2 25  GLUCOSE 123*  BUN 9  CREATININE 0.92  CALCIUM 8.6*   GFR: Estimated Creatinine Clearance: 44.6 mL/min (by C-G formula based on SCr of 0.92 mg/dL). Liver Function Tests: Recent Labs  Lab 09/09/20 0854  AST 18  ALT 12  ALKPHOS 83  BILITOT 1.0  PROT 7.0  ALBUMIN 3.7   Recent Labs  Lab 09/09/20 0854  LIPASE 25   No results for input(s): AMMONIA in the last 168 hours. Coagulation Profile: No results for input(s): INR, PROTIME in the last 168 hours. Cardiac Enzymes: No results for input(s): CKTOTAL, CKMB, CKMBINDEX, TROPONINI in the last 168 hours. BNP (last 3 results) No results for input(s): PROBNP in the last 8760  hours. HbA1C: No results for input(s): HGBA1C in the last 72 hours. CBG: No results for input(s): GLUCAP in the last 168 hours. Lipid Profile: No results for input(s): CHOL, HDL, LDLCALC, TRIG, CHOLHDL, LDLDIRECT in the last 72 hours. Thyroid Function Tests: No results for input(s): TSH, T4TOTAL, FREET4, T3FREE, THYROIDAB in the last 72 hours. Anemia Panel: No results for input(s): VITAMINB12, FOLATE, FERRITIN, TIBC, IRON, RETICCTPCT in the last 72 hours. Urine analysis:    Component Value Date/Time   COLORURINE YELLOW (A) 09/09/2020 1002   APPEARANCEUR CLEAR (A) 09/09/2020 1002   APPEARANCEUR Hazy 03/27/2013 1114   LABSPEC 1.012 09/09/2020 1002   LABSPEC 1.026 03/27/2013 1114   PHURINE 8.0 09/09/2020 1002   GLUCOSEU NEGATIVE 09/09/2020 1002   GLUCOSEU Negative 03/27/2013 1114   HGBUR NEGATIVE 09/09/2020 1002   BILIRUBINUR NEGATIVE 09/09/2020 1002   BILIRUBINUR Negative 03/27/2013 1114   KETONESUR NEGATIVE 09/09/2020 1002   PROTEINUR NEGATIVE 09/09/2020 1002   NITRITE NEGATIVE 09/09/2020 1002   LEUKOCYTESUR TRACE (A) 09/09/2020 1002   LEUKOCYTESUR Negative 03/27/2013 1114   Sepsis Labs: @LABRCNTIP (procalcitonin:4,lacticidven:4) ) Recent Results (from the past 240 hour(s))  Respiratory Panel by RT PCR (Flu A&B, Covid) - Nasopharyngeal Swab     Status: None   Collection Time: 09/09/20 12:34 PM   Specimen: Nasopharyngeal Swab  Result Value Ref Range Status   SARS Coronavirus 2 by RT PCR NEGATIVE NEGATIVE Final    Comment: (NOTE) SARS-CoV-2 target nucleic acids are NOT DETECTED.  The SARS-CoV-2 RNA is generally detectable in upper respiratoy specimens during the acute phase of infection. The lowest concentration of SARS-CoV-2 viral copies this assay can detect is 131 copies/mL. A negative result does not preclude SARS-Cov-2 infection and should not be used as the sole basis for treatment or other patient management decisions. A negative result may occur with  improper  specimen collection/handling, submission of specimen other than nasopharyngeal swab, presence of viral mutation(s) within the areas targeted by this assay, and inadequate number of viral copies (<131 copies/mL). A negative result must be combined with clinical observations, patient history, and epidemiological information. The expected result is Negative.  Fact Sheet for Patients:  13/08/21  Fact Sheet for Healthcare Providers:  https://www.moore.com/  This test is no t yet approved or cleared by the https://www.young.biz/ FDA and  has  been authorized for detection and/or diagnosis of SARS-CoV-2 by FDA under an Emergency Use Authorization (EUA). This EUA will remain  in effect (meaning this test can be used) for the duration of the COVID-19 declaration under Section 564(b)(1) of the Act, 21 U.S.C. section 360bbb-3(b)(1), unless the authorization is terminated or revoked sooner.     Influenza A by PCR NEGATIVE NEGATIVE Final   Influenza B by PCR NEGATIVE NEGATIVE Final    Comment: (NOTE) The Xpert Xpress SARS-CoV-2/FLU/RSV assay is intended as an aid in  the diagnosis of influenza from Nasopharyngeal swab specimens and  should not be used as a sole basis for treatment. Nasal washings and  aspirates are unacceptable for Xpert Xpress SARS-CoV-2/FLU/RSV  testing.  Fact Sheet for Patients: https://www.moore.com/  Fact Sheet for Healthcare Providers: https://www.young.biz/  This test is not yet approved or cleared by the [place] FDA and  has been authorized for detection and/or diagnosis of SARS-CoV-2 by  FDA under an Emergency Use Authorization (EUA). This EUA will remain  in effect (meaning this test can be used) for the duration of the  Covid-19 declaration under Section 564(b)(1) of the Act, 21  U.S.C. section 360bbb-3(b)(1), unless the authorization is  terminated or revoked. Performed at  [facility], [address], [place], [place] [address]      Radiological Exams on Admission: CT ABDOMEN PELVIS W CONTRAST  Result Date: 09/09/2020 CLINICAL DATA:  Abdominal pain and fever.  Dysuria. EXAM: CT ABDOMEN AND PELVIS WITH CONTRAST TECHNIQUE: Multidetector CT imaging of the abdomen and pelvis was performed using the standard protocol following bolus administration of intravenous contrast. CONTRAST:  OMNIPAQUE IOHEXOL 300 MG/ML  SOLN COMPARISON:  Mar 05, 2020 and July 28, 2018 FINDINGS: Lower chest: Stable scarring in the lung bases. No lung base edema or consolidation. Hepatobiliary: There are subcentimeter apparent cysts in the liver, stable. No evident new liver lesions. Gallbladder is absent. There is no appreciable intrahepatic biliary duct dilatation. Prominence of the common bile duct is again noted measuring 11 mm. Common hepatic duct measures 1.3 cm, slightly smaller compared to previous study. No biliary duct mass or calculus is demonstrable on this study. Pancreas: No pancreatic mass or inflammatory focus. Spleen: No splenic lesions evident. A small accessory spleen anteriorly is stable. Adrenals/Urinary Tract: Adrenals bilaterally appear normal. There is no evident renal mass or hydronephrosis on either side. No evident renal or ureteral calculus on either side. Urinary bladder is midline. Urinary bladder wall is borderline thickened. Stomach/Bowel: There are multiple sigmoid diverticula. There is soft tissue stranding in the region of the proximal to mid sigmoid colon consistent with a degree of diverticulitis. There is no fluid collection or evidence of abscess in this area of sigmoid diverticulitis no perforation evident. Scattered diverticula noted in the descending colon without inflammation. Elsewhere, no bowel wall thickening or bowel obstruction. Terminal ileum appears normal. There is fatty infiltration in ileocecal valve. No evident free air or portal  venous air. Vascular/Lymphatic: No abdominal aortic aneurysm. There is aortic and iliac artery atherosclerosis. Major venous structures appear patent. No evident adenopathy in the abdomen or pelvis. Reproductive: The uterus is absent.  No evident pelvic mass. Other: Appendix absent. No periappendiceal region inflammatory type change. No abscess or ascites is evident in the abdomen or pelvis. Slight fat in the umbilicus noted. Musculoskeletal: Degenerative change noted in the lumbar spine. There is a degree of spinal stenosis at L3-4 due to bony hypertrophy and disc protrusion. No intramuscular or abdominal wall  lesions are evident. IMPRESSION: 1. Diverticulitis involving the proximal to mid sigmoid colon with the inflammatory changes noted in the upper left pelvis extending to the midline. No fluid collection or abscess within the area of diverticulitis. No evident perforation. Sigmoid diverticulosis noted more distally in the sigmoid colon as well as in the descending colon. 2. Borderline thickening of the urinary bladder which could indicate a degree of cystitis. Correlation with urinalysis in this regard advised. 3. No bowel obstruction. No abscess in the abdomen or pelvis. Appendix absent. 4. No hydronephrosis on either side. No renal or ureteral calculus on either side. 5. Gallbladder absent. Biliary duct dilatation noted, slightly less than on prior study. No obstructing focus seen in the biliary ductal system by CT. 6. Spinal stenosis at L3-4 due to bony hypertrophy and disc protrusion. 7. Aortic Atherosclerosis (ICD10-I70.0). There also foci of iliac artery atherosclerotic calcification. 8.  Uterus absent. Electronically Signed   By: Bretta Bang III M.D.   On: 09/09/2020 11:13     EKG: I have personally reviewed.  Sinus rhythm, QTC 509, low voltage, LAE, poor R wave progression, nonspecific T wave change  Assessment/Plan Principal Problem:   Acute diverticulitis Active Problems:   HTN  (hypertension)   GERD (gastroesophageal reflux disease)   Depression   Acute diverticulitis: This is a recurrent issue, patient has leukocytosis with WBC 12.1, but no fever, clinically not septic. Currently hemodynamically stable  -Placed on MedSurg observation -Antibiotics: IV Rocephin and Flagyl were started in ED, will continue both -Follow-up blood culture -IV fluid: Received 1 L normal saline in ed -As needed hydroxyzine for nausea (patient has QTC 509, not a good candidate for Zofran) -As needed morphine and Percocet for pain  HTN (hypertension) -IV hydralazine as needed -Continue home amlodipine  GERD (gastroesophageal reflux disease) -Protonix  Depression and anxiety -Continue home as needed Ativan, Lexapro,     DVT ppx:  SQ Lovenox Code Status: DNR per her daughter in law Family Communication: Yes, patient's daughter-in law Disposition Plan:  Anticipate discharge back to previous environment Consults called:  none Admission status: Med-surg bed for obs   Status is: Observation  The patient remains OBS appropriate and will d/c before 2 midnights.  Dispo: The patient is from: Home              Anticipated d/c is to: Home              Anticipated d/c date is: 1 day              Patient currently is not medically stable to d/c.            Date of Service 09/09/2020    Lorretta Harp [facility]   If 7PM-7AM, please contact night-coverage [email or web address] 09/09/2020, 2:53 PM

## 2020-09-09 NOTE — ED Triage Notes (Signed)
Pt comes into the ED via EMS from home with c/o lower abd and back pain with N/V/D for the past 2 weeks.

## 2020-09-09 NOTE — ED Notes (Signed)
Pt via ems from home with bilateral abdominal and flank pain, worse on left side, accompanied by burning when urinating. Pt states she has had this pain for 2 weeks but that it is difficult to have someone take her to the dr. Rock Andrews states she has hx of diverticulitis. Pt alert & oriented, nad noted.

## 2020-09-10 DIAGNOSIS — I10 Essential (primary) hypertension: Secondary | ICD-10-CM | POA: Diagnosis present

## 2020-09-10 DIAGNOSIS — R3915 Urgency of urination: Secondary | ICD-10-CM | POA: Diagnosis present

## 2020-09-10 DIAGNOSIS — D72828 Other elevated white blood cell count: Secondary | ICD-10-CM | POA: Diagnosis not present

## 2020-09-10 DIAGNOSIS — E876 Hypokalemia: Secondary | ICD-10-CM | POA: Diagnosis present

## 2020-09-10 DIAGNOSIS — Z79899 Other long term (current) drug therapy: Secondary | ICD-10-CM | POA: Diagnosis not present

## 2020-09-10 DIAGNOSIS — E669 Obesity, unspecified: Secondary | ICD-10-CM | POA: Diagnosis present

## 2020-09-10 DIAGNOSIS — F32A Depression, unspecified: Secondary | ICD-10-CM | POA: Diagnosis present

## 2020-09-10 DIAGNOSIS — R9431 Abnormal electrocardiogram [ECG] [EKG]: Secondary | ICD-10-CM | POA: Diagnosis present

## 2020-09-10 DIAGNOSIS — H9190 Unspecified hearing loss, unspecified ear: Secondary | ICD-10-CM | POA: Diagnosis present

## 2020-09-10 DIAGNOSIS — R079 Chest pain, unspecified: Secondary | ICD-10-CM | POA: Diagnosis present

## 2020-09-10 DIAGNOSIS — Z66 Do not resuscitate: Secondary | ICD-10-CM | POA: Diagnosis present

## 2020-09-10 DIAGNOSIS — Z20822 Contact with and (suspected) exposure to covid-19: Secondary | ICD-10-CM | POA: Diagnosis present

## 2020-09-10 DIAGNOSIS — Z8719 Personal history of other diseases of the digestive system: Secondary | ICD-10-CM | POA: Diagnosis not present

## 2020-09-10 DIAGNOSIS — Z7189 Other specified counseling: Secondary | ICD-10-CM | POA: Diagnosis not present

## 2020-09-10 DIAGNOSIS — R350 Frequency of micturition: Secondary | ICD-10-CM | POA: Diagnosis present

## 2020-09-10 DIAGNOSIS — K219 Gastro-esophageal reflux disease without esophagitis: Secondary | ICD-10-CM | POA: Diagnosis present

## 2020-09-10 DIAGNOSIS — Z882 Allergy status to sulfonamides status: Secondary | ICD-10-CM | POA: Diagnosis not present

## 2020-09-10 DIAGNOSIS — Z87440 Personal history of urinary (tract) infections: Secondary | ICD-10-CM | POA: Diagnosis not present

## 2020-09-10 DIAGNOSIS — K5732 Diverticulitis of large intestine without perforation or abscess without bleeding: Secondary | ICD-10-CM | POA: Diagnosis present

## 2020-09-10 DIAGNOSIS — K5792 Diverticulitis of intestine, part unspecified, without perforation or abscess without bleeding: Secondary | ICD-10-CM | POA: Diagnosis present

## 2020-09-10 DIAGNOSIS — R531 Weakness: Secondary | ICD-10-CM | POA: Diagnosis present

## 2020-09-10 DIAGNOSIS — Z515 Encounter for palliative care: Secondary | ICD-10-CM | POA: Diagnosis not present

## 2020-09-10 DIAGNOSIS — Z6835 Body mass index (BMI) 35.0-35.9, adult: Secondary | ICD-10-CM | POA: Diagnosis not present

## 2020-09-10 DIAGNOSIS — Z8249 Family history of ischemic heart disease and other diseases of the circulatory system: Secondary | ICD-10-CM | POA: Diagnosis not present

## 2020-09-10 DIAGNOSIS — Z90710 Acquired absence of both cervix and uterus: Secondary | ICD-10-CM | POA: Diagnosis not present

## 2020-09-10 DIAGNOSIS — Z880 Allergy status to penicillin: Secondary | ICD-10-CM | POA: Diagnosis not present

## 2020-09-10 DIAGNOSIS — F419 Anxiety disorder, unspecified: Secondary | ICD-10-CM | POA: Diagnosis present

## 2020-09-10 DIAGNOSIS — K5900 Constipation, unspecified: Secondary | ICD-10-CM | POA: Diagnosis present

## 2020-09-10 LAB — CBC
HCT: 42.9 % (ref 36.0–46.0)
Hemoglobin: 14.5 g/dL (ref 12.0–15.0)
MCH: 30.6 pg (ref 26.0–34.0)
MCHC: 33.8 g/dL (ref 30.0–36.0)
MCV: 90.5 fL (ref 80.0–100.0)
Platelets: 314 10*3/uL (ref 150–400)
RBC: 4.74 MIL/uL (ref 3.87–5.11)
RDW: 13.9 % (ref 11.5–15.5)
WBC: 10.7 10*3/uL — ABNORMAL HIGH (ref 4.0–10.5)
nRBC: 0 % (ref 0.0–0.2)

## 2020-09-10 LAB — BASIC METABOLIC PANEL
Anion gap: 9 (ref 5–15)
BUN: 7 mg/dL — ABNORMAL LOW (ref 8–23)
CO2: 25 mmol/L (ref 22–32)
Calcium: 8 mg/dL — ABNORMAL LOW (ref 8.9–10.3)
Chloride: 105 mmol/L (ref 98–111)
Creatinine, Ser: 0.87 mg/dL (ref 0.44–1.00)
GFR, Estimated: >60 mL/min (ref >60)
Glucose, Bld: 103 mg/dL — ABNORMAL HIGH (ref 70–99)
Potassium: 3.2 mmol/L — ABNORMAL LOW (ref 3.5–5.1)
Sodium: 139 mmol/L (ref 135–145)

## 2020-09-10 MED ORDER — ACETAMINOPHEN 325 MG PO TABS
650.0000 mg | ORAL_TABLET | Freq: Four times a day (QID) | ORAL | Status: DC | PRN
  Administered 2020-09-19 – 2020-09-20 (×3): 650 mg via ORAL
  Filled 2020-09-10 (×3): qty 2

## 2020-09-10 MED ORDER — PROMETHAZINE HCL 25 MG/ML IJ SOLN
6.2500 mg | Freq: Four times a day (QID) | INTRAMUSCULAR | Status: DC | PRN
  Administered 2020-09-10 – 2020-09-11 (×4): 6.25 mg via INTRAVENOUS
  Filled 2020-09-10 (×4): qty 1

## 2020-09-10 NOTE — Evaluation (Signed)
Occupational Therapy Evaluation Patient Details Name: Geisha Abernathy Marcelli MRN: [id number] DOB: 10-30-1936 Today's Date: 09/10/2020    History of Present Illness Maybell O Tomer is a 84 y.o. female with medical history significant of hypertension, GERD, depression, anxiety, hard of hearing, diverticulitis, who presents with abdominal pain. Patient states that she has abdominal pain which has been going on for almost 2 weeks, and progressively worsening.  Her abdominal pain is located in the bilateral lower abdomen, constant, sharp, moderate, nonradiating.  She also has nausea and mild vomiting for few times, no diarrhea.  Patient states that she is mildly constipated.  No fever or chills.  Patient does not have chest pain, cough, shortness breath, symptoms of UTI or unilateral weakness. Patient has a burning on urination, no dysuria or hematuria   Clinical Impression   Ms. Bracken presents today as weak, withdrawn, with flat affect and little interest in engaging with others. She is cagey about describing her home situation, repeatedly saying, "why do you want to know?" and "you're nosey!" As best as I can tell, Ms. Smouse lives alone, with her small dog; she has a Network engineer and son/dtg-in-law who stop in intermittently. She has 4 STE her house; she says she does not have difficulty maneuvering those stairs, but reports rarely leaving her home. She states that she is unable to put on socks or pants, and therefore wears only "large nightshirts" and slippers. She would not respond to any questions regarding who provides groceries, who does cooking and cleaning, and how she manages other IADL. She has not driven in many years. She reports being able to transfer to/from toilet at home, but taking only sponge baths. Unclear if she has had any falls in previous 6 months. Upon evaluation today, Ms. Olano required greatly increased time and frequent encouragement to perform bed mobility, Max A to don socks, Mod A to come into  standing with RW, Min A for self-feeding (opening containers, orienting pt to plate, etc.). Pt reports 8/10 abdominal pain today, as well as nausea. Recommend ongoing OT while pt is hospitalized. Post discharge, recommend HHOT plus additional non-clinical in-home assistance. Questionable to what degree pt is safe at home alone, and how frequently she receives help from family. Can we provide information regarding Meals on Wheels, local visiting seniors programs, or other such services that could offer more regular check-ins with this pt once she returns home?    Follow Up Recommendations  Home health OT    Equipment Recommendations  None recommended by OT    Recommendations for Other Services  (application/assistance applying for Meals on Wheels)     Precautions / Restrictions Precautions Precautions: Fall Restrictions Weight Bearing Restrictions: No      Mobility Bed Mobility Overal bed mobility: Needs Assistance Bed Mobility: Supine to Sit     Supine to sit: Min guard     General bed mobility comments: greatly increased time, rest breaks, and repeated encouragement to move from supine<sit    Transfers Overall transfer level: Needs assistance Equipment used: Rolling walker (2 wheeled) Transfers: Sit to/from [employer] Sit to Stand: Mod assist Stand pivot transfers: Min assist       General transfer comment: Pt challenged to transition EOB sit<stand. Required multiple tries, ModA    Balance Overall balance assessment: Needs assistance Sitting-balance support: Feet supported;Bilateral upper extremity supported Sitting balance-Leahy Scale: Fair     Standing balance support: Bilateral upper extremity supported Standing balance-Leahy Scale: Fair Standing balance comment: Came into standing with  RW and UB at ~ 145 degree angle. After ~ 3 minutes standing in this posture, able to come into a more upright stance.                           ADL  either performed or assessed with clinical judgement   ADL Overall ADL's : Needs assistance/impaired Eating/Feeding: Set up;Supervision/ safety;Sitting                   Lower Body Dressing: Maximal assistance Lower Body Dressing Details (indicate cue type and reason): for donning socks. Pt reports that she only wears slip-on shoes at home; she is unable to reach feet             Functional mobility during ADLs: Moderate assistance General ADL Comments: Pt appears quite weak, required constant encourage to continue participate in therapy session     Vision Patient Visual Report: No change from baseline       Perception     Praxis      Pertinent Vitals/Pain Pain Assessment: 0-10 Pain Score: 8  Pain Location: Abdomen, primarily in LUQ Pain Intervention(s): Limited activity within patient's tolerance;Monitored during session;Patient requesting pain meds-RN notified;Repositioned     Hand Dominance     Extremity/Trunk Assessment Upper Extremity Assessment Upper Extremity Assessment: Generalized weakness   Lower Extremity Assessment Lower Extremity Assessment: Generalized weakness   Cervical / Trunk Assessment Cervical / Trunk Assessment: Kyphotic   Communication Communication Communication: HOH   Cognition Arousal/Alertness: Awake/alert Behavior During Therapy: Flat affect;Anxious Overall Cognitive Status: No family/caregiver present to determine baseline cognitive functioning                                     General Comments  Pt defensive, somewhat hostile to any questions regarding her home living situation.    Exercises Other Exercises Other Exercises: Bed mobility, transfers, grooming, LB dressing. Educ re: role of OT, POC, pain mgmt.   Shoulder Instructions      Home Living Family/patient expects to be discharged to:: Private residence Living Arrangements: Alone Available Help at Discharge: Friend(s);Available  PRN/intermittently Type of Home: House Home Access: Stairs to enter Entrance Stairs-Number of Steps: 4   Home Layout: One level     Bathroom Shower/Tub: Tub/shower unit (pt reports taking only sponge baths)   Bathroom Toilet: Standard     Home Equipment: Walker - 2 wheels;Cane - single point          Prior Functioning/Environment Level of Independence: Needs assistance  Gait / Transfers Assistance Needed: Uses a RW for ambulation at home. Reports that she transfers Mod I to toilet, unable to recall if she has had any falls in previous 6 months ADL's / Homemaking Assistance Needed: Pt takes sponge baths. She is unable to do LB dressing, instead reports wearing only nightgowns and slippers. She is unable/unwilling to report who does grocery shopping, cooking, and other IADL.            OT Problem List: Decreased strength;Impaired balance (sitting and/or standing);Decreased activity tolerance;Decreased safety awareness;Decreased cognition      OT Treatment/Interventions: Self-care/ADL training;Therapeutic activities;Balance training;Therapeutic exercise;Cognitive remediation/compensation;Energy conservation;Patient/family education    OT Goals(Current goals can be found in the care plan section) Acute Rehab OT Goals Patient Stated Goal: to get home to her dog Coco OT Goal Formulation: With patient Time For Goal Achievement: 09/24/20 Potential  to Achieve Goals: Good ADL Goals Pt Will Perform Grooming: with set-up;with min guard assist Pt Will Perform Upper Body Dressing: with set-up;with min guard assist Pt Will Transfer to Toilet: with min assist;stand pivot transfer (using LRAD)  OT Frequency: Min 1X/week   Barriers to D/C: Decreased caregiver support          Co-evaluation              AM-PAC OT "6 Clicks" Daily Activity     Outcome Measure Help from another person eating meals?: A Little Help from another person taking care of personal grooming?: A  Little Help from another person toileting, which includes using toliet, bedpan, or urinal?: A Lot Help from another person bathing (including washing, rinsing, drying)?: A Lot Help from another person to put on and taking off regular upper body clothing?: A Little Help from another person to put on and taking off regular lower body clothing?: A Lot 6 Click Score: 15   End of Session Equipment Utilized During Treatment: Gait belt;Rolling walker Nurse Communication: Patient requests pain meds  Activity Tolerance: Patient limited by fatigue;Patient limited by pain;Patient limited by lethargy Patient left: in chair;with call bell/phone within reach;with chair alarm set  OT Visit Diagnosis: Unsteadiness on feet (R26.81);Muscle weakness (generalized) (M62.81);Other symptoms and signs involving cognitive function                Time: 8177-1165 OT Time Calculation (min): 30 min Charges:  OT General Charges $OT Visit: 1 Visit OT Evaluation $OT Eval Moderate Complexity: 1 Mod OT Treatments $Self Care/Home Management : 23-37 mins  Latina Craver, PhD, MS, OTR/L ascom [phone] 09/10/20, 3:28 PM

## 2020-09-10 NOTE — Progress Notes (Addendum)
PROGRESS NOTE    Marilyn Andrews  EPP:[id number] DOB: 1936-01-17 DOA: 09/09/2020 PCP: [facility]   Assessment & Plan:   Principal Problem:   Acute diverticulitis Active Problems:   HTN (hypertension)   GERD (gastroesophageal reflux disease)   Depression   Acute diverticulitis: recurrent. Continue on IV rocephin & IV flagyl. Blood cxs NGTD Continue on IVFs. Morphine prn for pain.  Leukocytosis: secondary to above. Continue on IV abxs   HTN: continue on home dose of amlodipine. IV hydralazine prn   GERD: continue on protonix   Depression: severity unknown. Continue on home dose of lexapro  Anxiety: severity unknown. Continue on ativan prn    DVT prophylaxis: lovenox  Code Status: full  Family Communication:  Disposition Plan: likely d/c back home. Depends on PT/OT recs   Status is: Observation  The patient remains OBS appropriate and will d/c before 2 midnights.  Dispo: The patient is from: Home              Anticipated d/c is to: Home              Anticipated d/c date is: 2 days              Patient currently is not medically stable to d/c.    Consultants:      Procedures:    Antimicrobials: rocephin, flagyl   Subjective: Pt c/o abd pain   Objective: Vitals:   09/09/20 1714 09/09/20 2020 09/09/20 2355 09/10/20 0407  BP: (!) 166/58 (!) 142/54 140/64 (!) 146/61  Pulse: 71 74 89 79  Resp: (!) 24 (!) 28 (!) 24 20  Temp: 98.1 F (36.7 C) 99.1 F (37.3 C) 98.5 F (36.9 C) 98.9 F (37.2 C)  TempSrc: Oral Oral Oral Oral  SpO2: 96% 93% 90% 90%  Weight:      Height:        Intake/Output Summary (Last 24 hours) at 09/10/2020 0719 Last data filed at 09/10/2020 0634 Gross per 24 hour  Intake 1128.57 ml  Output 300 ml  Net 828.57 ml   Filed Weights   09/09/20 0851  Weight: 81.6 kg    Examination:  General exam: Appears calm and comfortable  Respiratory system: clear breath sounds b/l. No wheezes Cardiovascular system: S1 &  S2 +. No rubs, gallops or clicks.  Gastrointestinal system: Abdomen is nondistended, soft and tenderness to palpation. Hypoactive bowel sounds  Central nervous system: Alert and oriented. Moves all 4 extremities  Psychiatry: Judgement and insight appear normal. Flat mood and affect     Data Reviewed: I have personally reviewed following labs and imaging studies  CBC: Recent Labs  Lab 09/09/20 0854 09/10/20 0413  WBC 12.1* 10.7*  HGB 15.2* 14.5  HCT 45.0 42.9  MCV 89.3 90.5  PLT 338 314   Basic Metabolic Panel: Recent Labs  Lab 09/09/20 0854 09/10/20 0413  NA 137 139  K 3.5 3.2*  CL 103 105  CO2 25 25  GLUCOSE 123* 103*  BUN 9 7*  CREATININE 0.92 0.87  CALCIUM 8.6* 8.0*   GFR: Estimated Creatinine Clearance: 47.1 mL/min (by C-G formula based on SCr of 0.87 mg/dL). Liver Function Tests: Recent Labs  Lab 09/09/20 0854  AST 18  ALT 12  ALKPHOS 83  BILITOT 1.0  PROT 7.0  ALBUMIN 3.7   Recent Labs  Lab 09/09/20 0854  LIPASE 25   No results for input(s): AMMONIA in the last 168 hours. Coagulation Profile: No results for input(s):  INR, PROTIME in the last 168 hours. Cardiac Enzymes: No results for input(s): CKTOTAL, CKMB, CKMBINDEX, TROPONINI in the last 168 hours. BNP (last 3 results) No results for input(s): PROBNP in the last 8760 hours. HbA1C: No results for input(s): HGBA1C in the last 72 hours. CBG: No results for input(s): GLUCAP in the last 168 hours. Lipid Profile: No results for input(s): CHOL, HDL, LDLCALC, TRIG, CHOLHDL, LDLDIRECT in the last 72 hours. Thyroid Function Tests: No results for input(s): TSH, T4TOTAL, FREET4, T3FREE, THYROIDAB in the last 72 hours. Anemia Panel: No results for input(s): VITAMINB12, FOLATE, FERRITIN, TIBC, IRON, RETICCTPCT in the last 72 hours. Sepsis Labs: Recent Labs  Lab 09/09/20 1029 09/09/20 1711  LATICACIDVEN 1.3 1.1    Recent Results (from the past 240 hour(s))  Blood culture (single)     Status:  None (Preliminary result)   Collection Time: 09/09/20 10:29 AM   Specimen: BLOOD  Result Value Ref Range Status   Specimen Description BLOOD LEFT ANTECUBITAL  Final   Special Requests   Final    BOTTLES DRAWN AEROBIC AND ANAEROBIC Blood Culture results may not be optimal due to an inadequate volume of blood received in culture bottles   Culture   Final    NO GROWTH < 24 HOURS Performed at [facility], [address] [place], [place] [address]    Report Status PENDING  Incomplete  Respiratory Panel by RT PCR (Flu A&B, Covid) - Nasopharyngeal Swab     Status: None   Collection Time: 09/09/20 12:34 PM   Specimen: Nasopharyngeal Swab  Result Value Ref Range Status   SARS Coronavirus 2 by RT PCR NEGATIVE NEGATIVE Final    Comment: (NOTE) SARS-CoV-2 target nucleic acids are NOT DETECTED.  The SARS-CoV-2 RNA is generally detectable in upper respiratoy specimens during the acute phase of infection. The lowest concentration of SARS-CoV-2 viral copies this assay can detect is 131 copies/mL. A negative result does not preclude SARS-Cov-2 infection and should not be used as the sole basis for treatment or other patient management decisions. A negative result may occur with  improper specimen collection/handling, submission of specimen other than nasopharyngeal swab, presence of viral mutation(s) within the areas targeted by this assay, and inadequate number of viral copies (<131 copies/mL). A negative result must be combined with clinical observations, patient history, and epidemiological information. The expected result is Negative.  Fact Sheet for Patients:  https://www.moore.com/https://www.fda.gov/media/142436/download  Fact Sheet for Healthcare Providers:  https://www.young.biz/https://www.fda.gov/media/142435/download  This test is no t yet approved or cleared by the [place] FDA and  has been authorized for detection and/or diagnosis of SARS-CoV-2 by FDA under an Emergency Use Authorization (EUA). This EUA  will remain  in effect (meaning this test can be used) for the duration of the COVID-19 declaration under Section 564(b)(1) of the Act, 21 U.S.C. section 360bbb-3(b)(1), unless the authorization is terminated or revoked sooner.     Influenza A by PCR NEGATIVE NEGATIVE Final   Influenza B by PCR NEGATIVE NEGATIVE Final    Comment: (NOTE) The Xpert Xpress SARS-CoV-2/FLU/RSV assay is intended as an aid in  the diagnosis of influenza from Nasopharyngeal swab specimens and  should not be used as a sole basis for treatment. Nasal washings and  aspirates are unacceptable for Xpert Xpress SARS-CoV-2/FLU/RSV  testing.  Fact Sheet for Patients: https://www.moore.com/https://www.fda.gov/media/142436/download  Fact Sheet for Healthcare Providers: https://www.young.biz/https://www.fda.gov/media/142435/download  This test is not yet approved or cleared by the [place] FDA and  has been authorized for detection  and/or diagnosis of SARS-CoV-2 by  FDA under an Emergency Use Authorization (EUA). This EUA will remain  in effect (meaning this test can be used) for the duration of the  Covid-19 declaration under Section 564(b)(1) of the Act, 21  U.S.C. section 360bbb-3(b)(1), unless the authorization is  terminated or revoked. Performed at [facility], [address] [place], [place] [address]   Blood culture (single)     Status: None (Preliminary result)   Collection Time: 09/09/20  5:11 PM   Specimen: BLOOD  Result Value Ref Range Status   Specimen Description BLOOD RIGHT ANTECUBITAL  Final   Special Requests   Final    BOTTLES DRAWN AEROBIC AND ANAEROBIC Blood Culture adequate volume   Culture   Final    NO GROWTH < 24 HOURS Performed at [facility], [address] [place], [place] [address]    Report Status PENDING  Incomplete         Radiology Studies: CT ABDOMEN PELVIS W CONTRAST  Result Date: 09/09/2020 CLINICAL DATA:  Abdominal pain and fever.  Dysuria. EXAM: CT ABDOMEN AND PELVIS WITH  CONTRAST TECHNIQUE: Multidetector CT imaging of the abdomen and pelvis was performed using the standard protocol following bolus administration of intravenous contrast. CONTRAST:  OMNIPAQUE IOHEXOL 300 MG/ML  SOLN COMPARISON:  Mar 05, 2020 and July 28, 2018 FINDINGS: Lower chest: Stable scarring in the lung bases. No lung base edema or consolidation. Hepatobiliary: There are subcentimeter apparent cysts in the liver, stable. No evident new liver lesions. Gallbladder is absent. There is no appreciable intrahepatic biliary duct dilatation. Prominence of the common bile duct is again noted measuring 11 mm. Common hepatic duct measures 1.3 cm, slightly smaller compared to previous study. No biliary duct mass or calculus is demonstrable on this study. Pancreas: No pancreatic mass or inflammatory focus. Spleen: No splenic lesions evident. A small accessory spleen anteriorly is stable. Adrenals/Urinary Tract: Adrenals bilaterally appear normal. There is no evident renal mass or hydronephrosis on either side. No evident renal or ureteral calculus on either side. Urinary bladder is midline. Urinary bladder wall is borderline thickened. Stomach/Bowel: There are multiple sigmoid diverticula. There is soft tissue stranding in the region of the proximal to mid sigmoid colon consistent with a degree of diverticulitis. There is no fluid collection or evidence of abscess in this area of sigmoid diverticulitis no perforation evident. Scattered diverticula noted in the descending colon without inflammation. Elsewhere, no bowel wall thickening or bowel obstruction. Terminal ileum appears normal. There is fatty infiltration in ileocecal valve. No evident free air or portal venous air. Vascular/Lymphatic: No abdominal aortic aneurysm. There is aortic and iliac artery atherosclerosis. Major venous structures appear patent. No evident adenopathy in the abdomen or pelvis. Reproductive: The uterus is absent.  No evident pelvic  mass. Other: Appendix absent. No periappendiceal region inflammatory type change. No abscess or ascites is evident in the abdomen or pelvis. Slight fat in the umbilicus noted. Musculoskeletal: Degenerative change noted in the lumbar spine. There is a degree of spinal stenosis at L3-4 due to bony hypertrophy and disc protrusion. No intramuscular or abdominal wall lesions are evident. IMPRESSION: 1. Diverticulitis involving the proximal to mid sigmoid colon with the inflammatory changes noted in the upper left pelvis extending to the midline. No fluid collection or abscess within the area of diverticulitis. No evident perforation. Sigmoid diverticulosis noted more distally in the sigmoid colon as well as in the descending colon. 2. Borderline thickening of the urinary bladder which  could indicate a degree of cystitis. Correlation with urinalysis in this regard advised. 3. No bowel obstruction. No abscess in the abdomen or pelvis. Appendix absent. 4. No hydronephrosis on either side. No renal or ureteral calculus on either side. 5. Gallbladder absent. Biliary duct dilatation noted, slightly less than on prior study. No obstructing focus seen in the biliary ductal system by CT. 6. Spinal stenosis at L3-4 due to bony hypertrophy and disc protrusion. 7. Aortic Atherosclerosis (ICD10-I70.0). There also foci of iliac artery atherosclerotic calcification. 8.  Uterus absent. Electronically Signed   By: Bretta Bang III M.D.   On: 09/09/2020 11:13        Scheduled Meds: . amLODipine  5 mg Oral Daily  . enoxaparin (LOVENOX) injection  0.5 mg/kg Subcutaneous Q24H  . escitalopram  20 mg Oral Daily  . lactulose  20 g Oral Daily  . pantoprazole  40 mg Oral Daily   Continuous Infusions: . sodium chloride Stopped (09/10/20 0243)  . cefTRIAXone (ROCEPHIN)  IV    . metronidazole 100 mL/hr at 09/10/20 0300     LOS: 0 days    Time spent: 33 mins     Charise Killian, MD [facility] Pager [phone]  [phone]  If 7PM-7AM, please contact night-coverage [email or web address] 09/10/2020, 7:19 AM

## 2020-09-10 NOTE — Progress Notes (Signed)
OT Cancellation Note  Patient Details Name: Briaunna Grindstaff Holtmeyer MRN: [id number] DOB: 1936/03/30   Cancelled Treatment:    Reason Eval/Treat Not Completed: Patient declined, no reason specified. Order received, chart reviewed, two attempts this AM to evaluate pt. At first attempt, pt reports she has too much pain to participate in therapy. At second attempt, pt reports that she has recently taken anti-nausea med and is now too tired to participate. Will try to complete OT eval at a later time/date, when pt is able to engage.  Latina Craver, PhD, MS, OTR/L ascom [phone] 09/10/20, 10:38 AM

## 2020-09-10 NOTE — Progress Notes (Signed)
   09/09/20 2020 09/09/20 2220  Assess: MEWS Score  Temp 99.1 F (37.3 C)  --   BP (!) 142/54  --   Pulse Rate 74  --   Resp (!) 28  --   SpO2 93 %  --   O2 Device Room Air  --   Assess: MEWS Score  MEWS Temp 0  --   MEWS Systolic 0  --   MEWS Pulse 0  --   MEWS RR 2  --   MEWS LOC 0  --   MEWS Score 2  --   MEWS Score Color Yellow  --   Assess: if the MEWS score is Yellow or Red  Were vital signs taken at a resting state? Yes  --   Focused Assessment Change from prior assessment (see assessment flowsheet)  --   Early Detection of Sepsis Score *See Row Information* Low  --   MEWS guidelines implemented *See Row Information* No, vital signs rechecked  --   Treat  MEWS Interventions Other (Comment) (Administer PRN pain meds and continue to monitor)  --   Pain Scale 0-10  --   Pain Score 7  --   Pain Type Acute pain  --   Pain Location Abdomen  --   Pain Intervention(s) Repositioned  --   Escalate  MEWS: Escalate Yellow: discuss with charge nurse/RN and consider discussing with provider and RRT  --   Notify: Charge Nurse/RN  Name of Charge Nurse/RN Notified Marcella RN  --   Date Charge Nurse/RN Notified 09/09/20  --   Time Charge Nurse/RN Notified 2020  --   Document  Patient Outcome  --  Stabilized after interventions

## 2020-09-10 NOTE — Progress Notes (Signed)
PT Cancellation Note  Patient Details Name: Marilyn Andrews MRN: [id number] DOB: [birth year]   Cancelled Treatment:    Reason Eval/Treat Not Completed: Patient declined, no reason specified  Attempted to see pt this AM and she refused siting too much pain.  Attempted again this afternoon and she had just recently worked with OT and flatly refused to work with PT.  She states she's hurting too much and will need to wait until tomorrow.  Will maintain on PT caseload and attempt as appropriate.   Malachi Pro, DPT 09/10/2020, 4:09 PM

## 2020-09-11 ENCOUNTER — Encounter: Payer: Self-pay | Admitting: Internal Medicine

## 2020-09-11 DIAGNOSIS — K5792 Diverticulitis of intestine, part unspecified, without perforation or abscess without bleeding: Secondary | ICD-10-CM | POA: Diagnosis not present

## 2020-09-11 LAB — MAGNESIUM: Magnesium: 2.4 mg/dL (ref 1.7–2.4)

## 2020-09-11 MED ORDER — MORPHINE SULFATE (PF) 2 MG/ML IV SOLN
2.0000 mg | INTRAVENOUS | Status: DC | PRN
  Administered 2020-09-12: 2 mg via INTRAVENOUS
  Filled 2020-09-11: qty 1

## 2020-09-11 MED ORDER — POTASSIUM CHLORIDE IN NACL 40-0.9 MEQ/L-% IV SOLN
INTRAVENOUS | Status: DC
  Filled 2020-09-11 (×2): qty 1000

## 2020-09-11 MED ORDER — PROMETHAZINE HCL 25 MG/ML IJ SOLN
6.2500 mg | Freq: Four times a day (QID) | INTRAMUSCULAR | Status: DC | PRN
  Administered 2020-09-11 – 2020-09-19 (×19): 6.25 mg via INTRAVENOUS
  Filled 2020-09-11 (×20): qty 1

## 2020-09-11 MED ORDER — POTASSIUM CHLORIDE CRYS ER 20 MEQ PO TBCR
40.0000 meq | EXTENDED_RELEASE_TABLET | ORAL | Status: DC
  Filled 2020-09-11: qty 2

## 2020-09-11 MED ORDER — MORPHINE SULFATE (PF) 4 MG/ML IV SOLN: 4.0000 mg | INTRAVENOUS | Status: DC | PRN

## 2020-09-11 MED ORDER — METRONIDAZOLE 500 MG PO TABS
500.0000 mg | ORAL_TABLET | Freq: Three times a day (TID) | ORAL | Status: DC
  Administered 2020-09-11 – 2020-09-12 (×2): 500 mg via ORAL
  Filled 2020-09-11 (×4): qty 1

## 2020-09-11 NOTE — Progress Notes (Signed)
PHARMACIST - PHYSICIAN COMMUNICATION DR:   TRH CONCERNING: Antibiotic IV to Oral Route Change Policy  RECOMMENDATION: This patient is receiving metronidazole by the intravenous route.  Based on criteria approved by the Pharmacy and Therapeutics Committee, the antibiotic(s) is/are being converted to the equivalent oral dose form(s).  Metronidazole IV on national backorder,  Will change to PO as tolerating other PO medications   DESCRIPTION: These criteria include:  Patient being treated for a respiratory tract infection, urinary tract infection, cellulitis or clostridium difficile associated diarrhea if on metronidazole  The patient is not neutropenic and does not exhibit a GI malabsorption state  The patient is eating (either orally or via tube) and/or has been taking other orally administered medications for a least 24 hours  The patient is improving clinically and has a Tmax < 100.5  If you have questions about this conversion, please contact the Pharmacy Department  []   [phone] )  ( [phone] [x]   [phone] )  [facility] []   [phone] )  [facility] []   [phone] )  [facility] []   [phone] )    ( [phone], PharmD, BCPS.   Work Cell: [phone] 09/11/2020 11:35 AM

## 2020-09-11 NOTE — Progress Notes (Signed)
Progress Note    Marilyn Andrews  ZOX:[id number] DOB: [birth year]  DOA: 09/09/2020 PCP: [facility]      Brief Narrative:    Medical records reviewed and are as summarized below:  Marilyn Andrews is a 84 y.o. female       Assessment/Plan:   Principal Problem:   Acute diverticulitis Active Problems:   Diverticulitis   HTN (hypertension)   GERD (gastroesophageal reflux disease)   Depression   Body mass index is 35.15 kg/m.  (Obesity)   Acute diverticulitis: Continue empiric IV antibiotics.  Analgesics as needed for pain.  Increase morphine from 0.5 mg to 2mg .  Hypokalemia: Replete potassium intravenously since patient refused oral potassium because of nausea.  Monitor potassium level.  Hypertension: Continue antihypertensives  Depression and anxiety: Continue Escitalopram  GERD: Continue Protonix.   Diet Order            Diet Heart Room service appropriate? Yes; Fluid consistency: Thin  Diet effective now                    Consultants:  None  Procedures:  None    Medications:   . amLODipine  5 mg Oral Daily  . enoxaparin (LOVENOX) injection  0.5 mg/kg Subcutaneous Q24H  . escitalopram  20 mg Oral Daily  . lactulose  20 g Oral Daily  . metroNIDAZOLE  500 mg Oral Q8H  . pantoprazole  40 mg Oral Daily   Continuous Infusions: . sodium chloride Stopped (09/11/20 0945)  . 0.9 % NaCl with KCl 40 mEq / L 50 mL/hr at 09/11/20 1500  . cefTRIAXone (ROCEPHIN)  IV Stopped (09/11/20 1156)     Anti-infectives (From admission, onward)   Start     Dose/Rate Route Frequency Ordered Stop   09/11/20 1800  metroNIDAZOLE (FLAGYL) tablet 500 mg        500 mg Oral Every 8 hours 09/11/20 1133     09/10/20 1100  cefTRIAXone (ROCEPHIN) 2 g in sodium chloride 0.9 % 100 mL IVPB        2 g 200 mL/hr over 30 Minutes Intravenous Every 24 hours 09/09/20 1247     09/09/20 1800  metroNIDAZOLE (FLAGYL) IVPB 500 mg  Status:  Discontinued         500 mg 100 mL/hr over 60 Minutes Intravenous Every 8 hours 09/09/20 1247 09/11/20 1133   09/09/20 1130  cefTRIAXone (ROCEPHIN) 2 g in sodium chloride 0.9 % 100 mL IVPB        2 g 200 mL/hr over 30 Minutes Intravenous  Once 09/09/20 1120 09/09/20 1233   09/09/20 1130  metroNIDAZOLE (FLAGYL) IVPB 500 mg        500 mg 100 mL/hr over 60 Minutes Intravenous  Once 09/09/20 1120 09/09/20 1455             Family Communication/Anticipated D/C date and plan/Code Status   DVT prophylaxis: enoxaparin (LOVENOX) injection 40 mg Start: 09/09/20 2200     Code Status: DNR  Family Communication: None Disposition Plan:    Status is: Inpatient  Remains inpatient appropriate because:IV treatments appropriate due to intensity of illness or inability to take PO   Dispo: The patient is from: Home              Anticipated d/c is to: Home              Anticipated d/c date is: 2 days  Patient currently is not medically stable to d/c.           Subjective:   C/o nausea and severe left-sided lower abdominal pain.  Objective:    Vitals:   09/11/20 0001 09/11/20 0528 09/11/20 0743 09/11/20 1116  BP: (!) 169/54 (!) 152/53 (!) 154/61 (!) 128/55  Pulse: 67 65 83 63  Resp: 18 20 16 20   Temp: 98.9 F (37.2 C) 98.4 F (36.9 C) 98.8 F (37.1 C) 98.1 F (36.7 C)  TempSrc: Oral Oral Oral Oral  SpO2: 92% 94% 91% 93%  Weight:      Height:       No data found.   Intake/Output Summary (Last 24 hours) at 09/11/2020 1713 Last data filed at 09/11/2020 1500 Gross per 24 hour  Intake 1515.3 ml  Output 1150 ml  Net 365.3 ml   Filed Weights   09/09/20 0851  Weight: 81.6 kg    Exam:  GEN: NAD SKIN: Warm and dry EYES: EOMI ENT: MMM CV: RRR PULM: CTA B ABD: soft, obese, left lower quadrant tenderness, no rebound tenderness or guarding,, +BS CNS: AAO x 3, non focal EXT: No edema or tenderness   Data Reviewed:   I have personally reviewed following labs and  imaging studies:  Labs: Labs show the following:   Basic Metabolic Panel: Recent Labs  Lab 09/09/20 0854 09/10/20 0413 09/11/20 0958  NA 137 139  --   K 3.5 3.2*  --   CL 103 105  --   CO2 25 25  --   GLUCOSE 123* 103*  --   BUN 9 7*  --   CREATININE 0.92 0.87  --   CALCIUM 8.6* 8.0*  --   MG  --   --  2.4   GFR Estimated Creatinine Clearance: 47.1 mL/min (by C-G formula based on SCr of 0.87 mg/dL). Liver Function Tests: Recent Labs  Lab 09/09/20 0854  AST 18  ALT 12  ALKPHOS 83  BILITOT 1.0  PROT 7.0  ALBUMIN 3.7   Recent Labs  Lab 09/09/20 0854  LIPASE 25   No results for input(s): AMMONIA in the last 168 hours. Coagulation profile No results for input(s): INR, PROTIME in the last 168 hours.  CBC: Recent Labs  Lab 09/09/20 0854 09/10/20 0413  WBC 12.1* 10.7*  HGB 15.2* 14.5  HCT 45.0 42.9  MCV 89.3 90.5  PLT 338 314   Cardiac Enzymes: No results for input(s): CKTOTAL, CKMB, CKMBINDEX, TROPONINI in the last 168 hours. BNP (last 3 results) No results for input(s): PROBNP in the last 8760 hours. CBG: No results for input(s): GLUCAP in the last 168 hours. D-Dimer: No results for input(s): DDIMER in the last 72 hours. Hgb A1c: No results for input(s): HGBA1C in the last 72 hours. Lipid Profile: No results for input(s): CHOL, HDL, LDLCALC, TRIG, CHOLHDL, LDLDIRECT in the last 72 hours. Thyroid function studies: No results for input(s): TSH, T4TOTAL, T3FREE, THYROIDAB in the last 72 hours.  Invalid input(s): FREET3 Anemia work up: No results for input(s): VITAMINB12, FOLATE, FERRITIN, TIBC, IRON, RETICCTPCT in the last 72 hours. Sepsis Labs: Recent Labs  Lab 09/09/20 0854 09/09/20 1029 09/09/20 1711 09/10/20 0413  WBC 12.1*  --   --  10.7*  LATICACIDVEN  --  1.3 1.1  --     Microbiology Recent Results (from the past 240 hour(s))  Blood culture (single)     Status: None (Preliminary result)   Collection Time: 09/09/20 10:29 AM  Specimen: BLOOD  Result Value Ref Range Status   Specimen Description BLOOD LEFT ANTECUBITAL  Final   Special Requests   Final    BOTTLES DRAWN AEROBIC AND ANAEROBIC Blood Culture results may not be optimal due to an inadequate volume of blood received in culture bottles   Culture   Final    NO GROWTH 2 DAYS Performed at [facility], [address], [place], [place] [address]    Report Status PENDING  Incomplete  Respiratory Panel by RT PCR (Flu A&B, Covid) - Nasopharyngeal Swab     Status: None   Collection Time: 09/09/20 12:34 PM   Specimen: Nasopharyngeal Swab  Result Value Ref Range Status   SARS Coronavirus 2 by RT PCR NEGATIVE NEGATIVE Final    Comment: (NOTE) SARS-CoV-2 target nucleic acids are NOT DETECTED.  The SARS-CoV-2 RNA is generally detectable in upper respiratoy specimens during the acute phase of infection. The lowest concentration of SARS-CoV-2 viral copies this assay can detect is 131 copies/mL. A negative result does not preclude SARS-Cov-2 infection and should not be used as the sole basis for treatment or other patient management decisions. A negative result may occur with  improper specimen collection/handling, submission of specimen other than nasopharyngeal swab, presence of viral mutation(s) within the areas targeted by this assay, and inadequate number of viral copies (<131 copies/mL). A negative result must be combined with clinical observations, patient history, and epidemiological information. The expected result is Negative.  Fact Sheet for Patients:  https://www.moore.com/  Fact Sheet for Healthcare Providers:  https://www.young.biz/  This test is no t yet approved or cleared by the [place] FDA and  has been authorized for detection and/or diagnosis of SARS-CoV-2 by FDA under an Emergency Use Authorization (EUA). This EUA will remain  in effect (meaning this test can be used) for the  duration of the COVID-19 declaration under Section 564(b)(1) of the Act, 21 U.S.C. section 360bbb-3(b)(1), unless the authorization is terminated or revoked sooner.     Influenza A by PCR NEGATIVE NEGATIVE Final   Influenza B by PCR NEGATIVE NEGATIVE Final    Comment: (NOTE) The Xpert Xpress SARS-CoV-2/FLU/RSV assay is intended as an aid in  the diagnosis of influenza from Nasopharyngeal swab specimens and  should not be used as a sole basis for treatment. Nasal washings and  aspirates are unacceptable for Xpert Xpress SARS-CoV-2/FLU/RSV  testing.  Fact Sheet for Patients: https://www.moore.com/  Fact Sheet for Healthcare Providers: https://www.young.biz/  This test is not yet approved or cleared by the [place] FDA and  has been authorized for detection and/or diagnosis of SARS-CoV-2 by  FDA under an Emergency Use Authorization (EUA). This EUA will remain  in effect (meaning this test can be used) for the duration of the  Covid-19 declaration under Section 564(b)(1) of the Act, 21  U.S.C. section 360bbb-3(b)(1), unless the authorization is  terminated or revoked. Performed at [facility], [address] [place], [place] [address]   Blood culture (single)     Status: None (Preliminary result)   Collection Time: 09/09/20  5:11 PM   Specimen: BLOOD  Result Value Ref Range Status   Specimen Description BLOOD RIGHT ANTECUBITAL  Final   Special Requests   Final    BOTTLES DRAWN AEROBIC AND ANAEROBIC Blood Culture adequate volume   Culture   Final    NO GROWTH 2 DAYS Performed at [facility], [address] [place], [place] [address]    Report Status PENDING  Incomplete    Procedures and diagnostic studies:  No results found.             LOS: 1 day   Katarzyna Wolven  [facility]   Pager on [email or web address]. If 7PM-7AM, please contact night-coverage at [email or web address]     09/11/2020, 5:13  PM

## 2020-09-11 NOTE — Progress Notes (Signed)
PT Cancellation Note  Patient Details Name: Marilyn Andrews Grade MRN: [id number] DOB: [birth year]   Cancelled Treatment:    Reason Eval/Treat Not Completed: Patient declined, no reason specified Pt yet again uninterested in participating this AM.  Citing pain and generally not feeling well, PT did encourage he to at least get up and take a few steps to the recliner.  She hesitancy conceded that she may be willing to try later today but was resolute in her refusal to work with PT this AM.    Malachi Pro, DPT 09/11/2020, 9:32 AM

## 2020-09-11 NOTE — Progress Notes (Signed)
PT Cancellation Note  Patient Details Name: Banita Lehn Knappenberger MRN: [id number] DOB: May 21, 1936   Cancelled Treatment:    Reason Eval/Treat Not Completed: Patient declined, no reason specified Attempted multiple times again today to see this pt.  She was completely unwilling and kept citing abdominal pain and generally feeling too poorly to even try.  PT tried many different strategies to encourage differing levels of activity but pt refused all becoming gradually more frustrated that PT would not let her simply rest.  She did not indicate that she would be open to doing much tomorrow unless she is feeling a lot better.    Malachi Pro, DPT 09/11/2020, 4:32 PM

## 2020-09-12 DIAGNOSIS — I10 Essential (primary) hypertension: Secondary | ICD-10-CM | POA: Diagnosis not present

## 2020-09-12 DIAGNOSIS — K5792 Diverticulitis of intestine, part unspecified, without perforation or abscess without bleeding: Secondary | ICD-10-CM | POA: Diagnosis not present

## 2020-09-12 DIAGNOSIS — Z515 Encounter for palliative care: Secondary | ICD-10-CM | POA: Diagnosis not present

## 2020-09-12 DIAGNOSIS — Z7189 Other specified counseling: Secondary | ICD-10-CM | POA: Diagnosis not present

## 2020-09-12 LAB — CBC WITH DIFFERENTIAL/PLATELET
Abs Immature Granulocytes: 0.03 10*3/uL (ref 0.00–0.07)
Basophils Absolute: 0.1 10*3/uL (ref 0.0–0.1)
Basophils Relative: 1 %
Eosinophils Absolute: 0.5 10*3/uL (ref 0.0–0.5)
Eosinophils Relative: 6 %
HCT: 40.9 % (ref 36.0–46.0)
Hemoglobin: 13.4 g/dL (ref 12.0–15.0)
Immature Granulocytes: 0 %
Lymphocytes Relative: 12 %
Lymphs Abs: 1 10*3/uL (ref 0.7–4.0)
MCH: 30.2 pg (ref 26.0–34.0)
MCHC: 32.8 g/dL (ref 30.0–36.0)
MCV: 92.1 fL (ref 80.0–100.0)
Monocytes Absolute: 0.7 10*3/uL (ref 0.1–1.0)
Monocytes Relative: 8 %
Neutro Abs: 6.6 10*3/uL (ref 1.7–7.7)
Neutrophils Relative %: 73 %
Platelets: 307 10*3/uL (ref 150–400)
RBC: 4.44 MIL/uL (ref 3.87–5.11)
RDW: 13.8 % (ref 11.5–15.5)
WBC: 8.9 10*3/uL (ref 4.0–10.5)
nRBC: 0 % (ref 0.0–0.2)

## 2020-09-12 LAB — BASIC METABOLIC PANEL
Anion gap: 6 (ref 5–15)
BUN: 10 mg/dL (ref 8–23)
CO2: 26 mmol/L (ref 22–32)
Calcium: 7.7 mg/dL — ABNORMAL LOW (ref 8.9–10.3)
Chloride: 110 mmol/L (ref 98–111)
Creatinine, Ser: 0.86 mg/dL (ref 0.44–1.00)
GFR, Estimated: >60 mL/min (ref >60)
Glucose, Bld: 99 mg/dL (ref 70–99)
Potassium: 4.1 mmol/L (ref 3.5–5.1)
Sodium: 142 mmol/L (ref 135–145)

## 2020-09-12 MED ORDER — ONDANSETRON HCL 4 MG/2ML IJ SOLN
4.0000 mg | Freq: Four times a day (QID) | INTRAMUSCULAR | Status: DC | PRN
  Administered 2020-09-12 (×2): 4 mg via INTRAVENOUS
  Filled 2020-09-12 (×2): qty 2

## 2020-09-12 MED ORDER — SODIUM CHLORIDE 0.9 % IV SOLN
1.0000 g | Freq: Two times a day (BID) | INTRAVENOUS | Status: DC
  Administered 2020-09-12 – 2020-09-15 (×8): 1 g via INTRAVENOUS
  Filled 2020-09-12 (×10): qty 1

## 2020-09-12 MED ORDER — SODIUM CHLORIDE 0.9 % IV SOLN: 1.0000 g | Freq: Three times a day (TID) | INTRAVENOUS | Status: DC

## 2020-09-12 MED ORDER — HYDROMORPHONE HCL 1 MG/ML IJ SOLN
0.5000 mg | INTRAMUSCULAR | Status: DC | PRN
  Administered 2020-09-14 – 2020-09-16 (×11): 0.5 mg via INTRAVENOUS
  Filled 2020-09-12 (×2): qty 1
  Filled 2020-09-12 (×5): qty 0.5
  Filled 2020-09-12 (×2): qty 1
  Filled 2020-09-12 (×2): qty 0.5

## 2020-09-12 NOTE — Evaluation (Signed)
Physical Therapy Evaluation Patient Details Name: Marilyn Andrews MRN: [id number] DOB: 1936-10-15 Today's Date: 09/12/2020   History of Present Illness  Marilyn Andrews is a 84 y.o. female with medical history significant of hypertension, GERD, depression, anxiety, hard of hearing, diverticulitis, who presents with abdominal pain. Patient states that she has abdominal pain which has been going on for almost 2 weeks, and progressively worsening.  Her abdominal pain is located in the bilateral lower abdomen, constant, sharp, moderate, nonradiating.  She also has nausea and mild vomiting for few times, no diarrhea.  Patient states that she is mildly constipated.  No fever or chills.  Patient does not have chest pain, cough, shortness breath, symptoms of UTI or unilateral weakness. Patient has a burning on urination, no dysuria or hematuria    Clinical Impression  Pt is a 84 year old F admitted to hospital on 09/09/20 for abdominal pain and nausea secondary to acute diverticulitis. Subjective information limited secondary to pt agitation with questioning. At baseline, pt uses RW for household ambulation, takes sponge baths, and has increased difficulty with lower body dressing. Pt lives alone and son/DIL provide PRN assistance. Pt presents with generalized weakness/deconditioning, decreased gross balance, c/o dizziness with positional changes, increased pain, and agitation/low motivation, resulting in impaired functional mobility from baseline. Due to deficits, pt required CGA for bed mobility, mod assist for transfers, min assist for short distance ambulation/stand-pivot transfers with RW, and max encouragement from PT/RN for participation with therapy. Further mobility limitied secondary to pt low motivation and c/o abdominal pain and nausea. Vestibular screen revealed mild tracking and corrective saccade impairments with smooth pursuit testing, although pt was asymptomatic with testing. C/o dizziness with  positional changes and vestibular screen findings could indicate possible vestibular involvement. Deficits limit the pt's ability to safely and independently perform ADL's, transfer, and ambulate. Pt will benefit from acute skilled PT services to address deficits for return to baseline function. Pt may also benefit from psych/pastoral care consult as made the following statements during PT session: "I'm just ready to go. I don't want to be here anymore and be a bother to anyone. I don't care if I get better" - RN, SW, and MD notified.  At this time, PT recommends SNF at DC due to unknown assistance at home. If pt able to have 24/7 care at home at DC, then will recommend DC home with HHPT/OT and 24/7 care.     Follow Up Recommendations SNF    Equipment Recommendations  Other (comment) (Will defer to postacute facility; unknown DME at home)    Recommendations for Other Services       Precautions / Restrictions Precautions Precautions: Fall Precaution Comments: DNR Restrictions Weight Bearing Restrictions: No      Mobility  Bed Mobility Overal bed mobility: Needs Assistance Bed Mobility: Supine to Sit     Supine to sit: Min guard     General bed mobility comments: Pt required min guard for safety to sit EOB with HOB elevated, increased reliance of UE on bed rail, and increased time/effort to perform mobility.    Transfers Overall transfer level: Needs assistance Equipment used: Rolling walker (2 wheeled) Transfers: Sit to/from [employer] Sit to Stand: Mod assist;From elevated surface Stand pivot transfers: Min assist       General transfer comment: Pt required mod assist to stand from EOB and BSC with RW; verbal cues provided for hand placement to ensure safety with transfer. Pt required min assist for safety and  balance to transfer from EOB>BSC>recliner with RW due to intermittent knee buckling and slowed cadence.  Ambulation/Gait Ambulation/Gait assistance:  Min assist Gait Distance (Feet): 1 Feet (x2 (from EOB>BSC>recliner)) Assistive device: Rolling walker (2 wheeled)       General Gait Details: Pt required min assist for safety and balance to ambulate short distance with RW. Pt demonstrated slowed, shuffled gait pattern, intermittent knee buckling, forward flexed posture, and narrow BOS. Pt with c/o dizziness and nausea limiting further mobility.  Stairs            Wheelchair Mobility    Modified Rankin (Stroke Patients Only)       Balance Overall balance assessment: Needs assistance Sitting-balance support: Feet supported;Bilateral upper extremity supported Sitting balance-Leahy Scale: Fair Sitting balance - Comments: Fair seated balance at EOB with BUE/BLE support, and intermittent posterior lean. Pt with c/o dizziness. Postural control: Posterior lean Standing balance support: Bilateral upper extremity supported;During functional activity Standing balance-Leahy Scale: Poor Standing balance comment: Poor standing balance in RW with BUE support due to intermittent knee buckling and slowed cadence.                             Pertinent Vitals/Pain Pain Assessment: Faces Faces Pain Scale: Hurts even more Pain Location: abdomen Pain Descriptors / Indicators: Grimacing Pain Intervention(s): Limited activity within patient's tolerance;Patient requesting pain meds-RN notified;Monitored during session;Premedicated before session;Repositioned    Home Living Family/patient expects to be discharged to:: Private residence Living Arrangements: Alone Available Help at Discharge: Friend(s);Available PRN/intermittently Type of Home: House Home Access: Stairs to enter Entrance Stairs-Rails: Right;Left;Can reach both Entrance Stairs-Number of Steps: 4 Home Layout: One level Home Equipment: Walker - 2 wheels;Cane - single point      Prior Function Level of Independence: Needs assistance   Gait / Transfers Assistance  Needed: Uses a RW for ambulation at home. Reports that she transfers Mod I to toilet, unable to recall if she has had any falls in previous 6 months  ADL's / Homemaking Assistance Needed: Pt takes sponge baths. She is unable to do LB dressing, instead reports wearing only nightgowns and slippers. She is unable/unwilling to report who does grocery shopping, cooking, and other IADL.        Hand Dominance        Extremity/Trunk Assessment   Upper Extremity Assessment Upper Extremity Assessment: Generalized weakness    Lower Extremity Assessment Lower Extremity Assessment: Generalized weakness    Cervical / Trunk Assessment Cervical / Trunk Assessment: Kyphotic  Communication   Communication: HOH  Cognition Arousal/Alertness: Awake/alert Behavior During Therapy: Flat affect;Anxious Overall Cognitive Status: No family/caregiver present to determine baseline cognitive functioning                                        General Comments General comments (skin integrity, edema, etc.): Vestibular screen performed due to pt c/o dizziness with positional changes. Pt demonstrated decreased tracking to the L/R and intermittent corrective saccades with smooth pursuits. Unremarkable saccades. No c/o increased dizziness with vestibular testing.    Exercises Other Exercises Other Exercises: Pt able to maintain static seated balance on BSC for ~60min for toileting ADL's. Performed x2 stand-pivot transfers from EOB>BSC>recliner with RW. Pt required increased cueing for pursed lip breathing, and hand placement to ensure safety with transfer.   Assessment/Plan    PT Assessment Patient needs  continued PT services  PT Problem List Decreased strength;Decreased safety awareness;Decreased coordination;Obesity;Decreased activity tolerance;Decreased balance;Cardiopulmonary status limiting activity;Pain       PT Treatment Interventions Gait training;Functional mobility  training;Therapeutic activities;Therapeutic exercise;Balance training;Neuromuscular re-education    PT Goals (Current goals can be found in the Care Plan section)  Acute Rehab PT Goals Patient Stated Goal: to get home to her dog Coco PT Goal Formulation: With patient Time For Goal Achievement: 09/26/20 Potential to Achieve Goals: Fair    Frequency Min 2X/week   Barriers to discharge Decreased caregiver support      Co-evaluation               AM-PAC PT "6 Clicks" Mobility  Outcome Measure Help needed turning from your back to your side while in a flat bed without using bedrails?: A Little Help needed moving from lying on your back to sitting on the side of a flat bed without using bedrails?: A Little Help needed moving to and from a bed to a chair (including a wheelchair)?: A Little Help needed standing up from a chair using your arms (e.g., wheelchair or bedside chair)?: A Little Help needed to walk in hospital room?: A Little Help needed climbing 3-5 steps with a railing? : A Lot 6 Click Score: 17    End of Session Equipment Utilized During Treatment: Gait belt Activity Tolerance: Patient limited by pain;Patient limited by fatigue;Treatment limited secondary to agitation Patient left: in chair;with call bell/phone within reach;with chair alarm set (BLE elevated on pillow) Nurse Communication: Mobility status;Patient requests pain meds PT Visit Diagnosis: Unsteadiness on feet (R26.81);Other abnormalities of gait and mobility (R26.89);Muscle weakness (generalized) (M62.81);Difficulty in walking, not elsewhere classified (R26.2);Dizziness and giddiness (R42);Pain Pain - Right/Left: Left Pain - part of body:  (abdomen)    Time: 4081-4481 PT Time Calculation (min) (ACUTE ONLY): 30 min   Charges:   PT Evaluation $PT Eval Moderate Complexity: 1 Mod PT Treatments $Therapeutic Activity: 8-22 mins       Vira Blanco, PT, DPT 1:08 PM,09/12/20

## 2020-09-12 NOTE — Consult Note (Signed)
Consultation Note Date: 09/12/2020   Patient Name: Marilyn Andrews  DOB: January 31, 1936  MRN: [id number]  Age / Sex: 84 y.o., female  PCP: [facility] Referring Physician: Lurene Shadow, MD  Reason for Consultation: Establishing goals of care  HPI/Patient Profile:  Marilyn Andrews is a 84 y.o. female with medical history significant of hypertension, GERD, depression, anxiety, hard of hearing, diverticulitis, who presents with abdominal pain. Patient states that she has abdominal pain which has been going on for almost 2 weeks, and progressively worsening.   Clinical Assessment and Goals of Care: Patient is resting in bed. She states she lives alone with her dog Derenda Fennel, who is a lot of company. She has a son and DIL who comes to help her around the house. She states she does not cook or clean. She does not use the stove because she is afraid she will forget to turn it off. She does microwave meals, and states she has a good appetite. She no longer drives. She voices frustration and anxiety with loss of independence, and having to depend on others to help her.   We discussed her diagnosis, prognosis, and GOC.   A detailed discussion was had today regarding advanced directives.  Concepts specific to code status, artifical feeding and hydration, IV antibiotics and rehospitalization were discussed.  The difference between an aggressive medical intervention path and a comfort care path was discussed.  Values and goals of care important to patient and family were attempted to be elicited.  She voices concern about her pain management as she states she usually has a small amount of narcotics and antibiotic for if she develops a diverticular flare or a UTI. She states this small supply keeps her from having to go to the hospital. She advises she was out of medications.  She also voices concerns with nausea  management stating she requires Phenergan for nausea from narcotics, which was not initiated until yesterday.   She states she has a DNR bracelet because she would not want CPR; she would not want to be placed on a ventilator, as she would never want to live in a nursing facility, which she voices would be the outcome of either of these interventions in the best case scenario. She tells me she is interested in continuing to treat the treatable and would want to return to the hospital as needed.      SUMMARY OF RECOMMENDATIONS   DNR/DNI. Treat the treatable.  Could try a Scopolamine patch for recurrent nausea.    Prognosis:   Unable to determine      Primary Diagnoses: Present on Admission: . Acute diverticulitis . HTN (hypertension) . GERD (gastroesophageal reflux disease) . Depression . Diverticulitis   I have reviewed the medical record, interviewed the patient and family, and examined the patient. The following aspects are pertinent.  Past Medical History:  Diagnosis Date  . Anxiety   . Arthritis   . Depression   . Diverticulitis   . Dyspnea   . Edema  FEET/LEGS  . HOH (hard of hearing)   . Hypertension    Social History   Socioeconomic History  . Marital status: Single    Spouse name: Not on file  . Number of children: Not on file  . Years of education: Not on file  . Highest education level: Not on file  Occupational History  . Occupation: retired  Tobacco Use  . Smoking status: Never Smoker  . Smokeless tobacco: Never Used  Vaping Use  . Vaping Use: Never used  Substance and Sexual Activity  . Alcohol use: No  . Drug use: No  . Sexual activity: Not Currently  Other Topics Concern  . Not on file  Social History Narrative  . Not on file   Social Determinants of Health   Financial Resource Strain:   . Difficulty of Paying Living Expenses: Not on file  Food Insecurity:   . Worried About Programme researcher, broadcasting/film/video in the Last Year: Not on file  . Ran  Out of Food in the Last Year: Not on file  Transportation Needs:   . Lack of Transportation (Medical): Not on file  . Lack of Transportation (Non-Medical): Not on file  Physical Activity:   . Days of Exercise per Week: Not on file  . Minutes of Exercise per Session: Not on file  Stress:   . Feeling of Stress : Not on file  Social Connections:   . Frequency of Communication with Friends and Family: Not on file  . Frequency of Social Gatherings with Friends and Family: Not on file  . Attends Religious Services: Not on file  . Active Member of Clubs or Organizations: Not on file  . Attends Banker Meetings: Not on file  . Marital Status: Not on file   Family History  Problem Relation Age of Onset  . Hypertension Father   . CAD Brother    Scheduled Meds: . amLODipine  5 mg Oral Daily  . enoxaparin (LOVENOX) injection  0.5 mg/kg Subcutaneous Q24H  . escitalopram  20 mg Oral Daily  . lactulose  20 g Oral Daily  . pantoprazole  40 mg Oral Daily   Continuous Infusions: . sodium chloride Stopped (09/11/20 0945)  . meropenem (MERREM) IV Stopped (09/12/20 1219)   PRN Meds:.sodium chloride, acetaminophen, docusate sodium, fluticasone, hydrALAZINE, HYDROmorphone (DILAUDID) injection, hydrOXYzine, LORazepam, ondansetron (ZOFRAN) IV, oxyCODONE-acetaminophen, promethazine Medications Prior to Admission:  Prior to Admission medications   Medication Sig Start Date End Date Taking? Authorizing Provider  docusate sodium (COLACE) 100 MG capsule Take 200 mg by mouth 2 (two) times daily as needed for mild constipation.   Yes [provider]  fluticasone (FLONASE) 50 MCG/ACT nasal spray Place 2 sprays into both nostrils 2 (two) times daily as needed for allergies or rhinitis.    Yes [provider]  LORazepam (ATIVAN) 0.5 MG tablet Take 0.5 mg by mouth 2 (two) times daily as needed. 07/22/20  Yes [provider]  promethazine (PHENERGAN) 25 MG tablet Take 12-25  mg by mouth every 6 (six) hours as needed for nausea or vomiting.   Yes [provider]  acetaminophen (TYLENOL) 500 MG tablet Take 1,000 mg by mouth every 6 (six) hours as needed for mild pain or moderate pain.     [provider]  amLODipine (NORVASC) 5 MG tablet Take 5 mg by mouth daily.    [provider]  escitalopram (LEXAPRO) 20 MG tablet Take 1 tablet (20 mg total) by mouth daily. 03/14/18  Katha Hamming, MD  HYDROcodone-acetaminophen (NORCO/VICODIN) 5-325 MG tablet Take 1 tablet by mouth every 6 (six) hours as needed for moderate pain. Patient not taking: Reported on 09/09/2020    [provider]  ibuprofen (ADVIL,MOTRIN) 200 MG tablet Take 800 mg by mouth every 6 (six) hours as needed for headache or moderate pain.    [provider]  lactulose (CEPHULAC) 20 g packet Take 1 packet (20 g total) by mouth daily as needed. 03/08/20   Marrion Coy, MD  mirtazapine (REMERON SOL-TAB) 15 MG disintegrating tablet Take 15 mg by mouth at bedtime. Patient not taking: Reported on 09/09/2020    [provider]  ondansetron (ZOFRAN ODT) 4 MG disintegrating tablet Take 1 tablet (4 mg total) by mouth every 8 (eight) hours as needed for nausea or vomiting. 07/29/18   Auburn Bilberry, MD  pantoprazole (PROTONIX) 40 MG tablet Take 40 mg by mouth daily. Patient not taking: Reported on 09/09/2020    [provider]  ranitidine (ZANTAC) 150 MG tablet Take 150 mg by mouth 3 (three) times daily as needed for heartburn.  Patient not taking: Reported on 09/09/2020    [provider]  senna-docusate (SENOKOT-S) 8.6-50 MG tablet Take 2 tablets by mouth 2 (two) times daily as needed for mild constipation. 03/08/20   Marrion Coy, MD  triamcinolone cream (KENALOG) 0.1 % Apply 1 application topically daily as needed (rash).     [provider]   Allergies  Allergen Reactions  . Penicillins Other (See Comments)    Reaction as a child,  patient does not recall  Has patient had a PCN reaction causing immediate rash, facial/tongue/throat swelling, SOB or lightheadedness with hypotension: unknown Has patient had a PCN reaction causing severe rash involving mucus membranes or skin necrosis: unknown Has patient had a PCN reaction that required hospitalization: unknown Has patient had a PCN reaction occurring within the last 10 years: No If all of the above answers are "NO", then may proceed with Cephalosporin use.  . Sulfa Antibiotics Nausea Only   Review of Systems  Unable to perform ROS   Physical Exam Pulmonary:     Effort: Pulmonary effort is normal.  Neurological:     Mental Status: She is alert.     Vital Signs: BP (!) 130/59 (BP Location: Right Arm) Comment: Nurse Notified   Pulse (!) 59 Comment: nurse notified  Temp 97.6 F (36.4 C) (Oral)   Resp 18   Ht 5' (1.524 m)   Wt 81.6 kg   SpO2 96%   BMI 35.15 kg/m  Pain Scale: 0-10 POSS *See Group Information*: 1-Acceptable,Awake and alert Pain Score: 9    SpO2: SpO2: 96 % O2 Device:SpO2: 96 % O2 Flow Rate: .   IO: Intake/output summary:   Intake/Output Summary (Last 24 hours) at 09/12/2020 1541 Last data filed at 09/12/2020 1523 Gross per 24 hour  Intake 1515.47 ml  Output 700 ml  Net 815.47 ml    LBM: Last BM Date: 09/12/20 Baseline Weight: Weight: 81.6 kg Most recent weight: Weight: 81.6 kg     Palliative Assessment/Data:     Time In: 3:00 Time Out: 3:30 Time Total: 30 min Greater than 50%  of this time was spent counseling and coordinating care related to the above assessment and plan.  Signed by: Morton Stall, NP   Please contact Palliative Medicine Team phone at [phone] for questions and concerns.  For individual provider: See Loretha Stapler

## 2020-09-12 NOTE — Progress Notes (Addendum)
Progress Note    Marilyn Andrews  BTD:[id number] DOB: [birth year]  DOA: 09/09/2020 PCP: [facility]      Brief Narrative:    Medical records reviewed and are as summarized below:  Marilyn Andrews is a 84 y.o. female       Assessment/Plan:   Principal Problem:   Acute diverticulitis Active Problems:   Diverticulitis   HTN (hypertension)   GERD (gastroesophageal reflux disease)   Depression   Body mass index is 35.15 kg/m.  (Obesity)   Acute diverticulitis: Discontinue IV ceftriaxone and oral Flagyl we will start IV meropenem.  Change IV morphine to IV Dilaudid for adequate pain control.  And IV Zofran to IV Phenergan for nausea.  De-escalate diet therapy from regular diet to clear liquid diet.  Consider repeat CT abdomen and pelvis if she fails to improve.  Consult palliative care team  Hypokalemia: Improved.  Discontinue IV fluids with potassium chloride.  Hypertension: Continue antihypertensives  Depression and anxiety: I was notified by Maralyn Sago, physical therapist, that patient said that she did not care whether she got better or not and had stated "I am just ready to go.  I don't want to be here anymore and be a bother to anyone.  I do not care if I get better".  I went back to talk to the patient about this.  She admits to being depressed.  However, she said that she is not suicidal.  She said that therapist was "preaching about me being young" but she told her that she was 84 years old and she wanted to be left alone if her time was up.  Continue citalopram  GERD: Continue Protonix  PT recommends discharge to SNF.   Diet Order            Diet clear liquid Room service appropriate? Yes; Fluid consistency: Thin  Diet effective now                    Consultants:  None  Procedures:  None    Medications:   . amLODipine  5 mg Oral Daily  . enoxaparin (LOVENOX) injection  0.5 mg/kg Subcutaneous Q24H  . escitalopram  20 mg Oral  Daily  . lactulose  20 g Oral Daily  . pantoprazole  40 mg Oral Daily   Continuous Infusions: . sodium chloride Stopped (09/11/20 0945)  . meropenem (MERREM) IV 1 g (09/12/20 1148)     Anti-infectives (From admission, onward)   Start     Dose/Rate Route Frequency Ordered Stop   09/12/20 1145  meropenem (MERREM) 1 g in sodium chloride 0.9 % 100 mL IVPB        1 g 200 mL/hr over 30 Minutes Intravenous Every 12 hours 09/12/20 1047     09/12/20 1130  meropenem (MERREM) 1 g in sodium chloride 0.9 % 100 mL IVPB  Status:  Discontinued        1 g 200 mL/hr over 30 Minutes Intravenous Every 8 hours 09/12/20 1043 09/12/20 1047   09/11/20 1800  metroNIDAZOLE (FLAGYL) tablet 500 mg  Status:  Discontinued        500 mg Oral Every 8 hours 09/11/20 1133 09/12/20 1043   09/10/20 1100  cefTRIAXone (ROCEPHIN) 2 g in sodium chloride 0.9 % 100 mL IVPB  Status:  Discontinued        2 g 200 mL/hr over 30 Minutes Intravenous Every 24 hours 09/09/20 1247 09/12/20 1043   09/09/20  1800  metroNIDAZOLE (FLAGYL) IVPB 500 mg  Status:  Discontinued        500 mg 100 mL/hr over 60 Minutes Intravenous Every 8 hours 09/09/20 1247 09/11/20 1133   09/09/20 1130  cefTRIAXone (ROCEPHIN) 2 g in sodium chloride 0.9 % 100 mL IVPB        2 g 200 mL/hr over 30 Minutes Intravenous  Once 09/09/20 1120 09/09/20 1233   09/09/20 1130  metroNIDAZOLE (FLAGYL) IVPB 500 mg        500 mg 100 mL/hr over 60 Minutes Intravenous  Once 09/09/20 1120 09/09/20 1455             Family Communication/Anticipated D/C date and plan/Code Status   DVT prophylaxis: enoxaparin (LOVENOX) injection 40 mg Start: 09/09/20 2200     Code Status: DNR  Family Communication: None Disposition Plan:    Status is: Inpatient  Remains inpatient appropriate because:IV treatments appropriate due to intensity of illness or inability to take PO   Dispo: The patient is from: Home              Anticipated d/c is to: Home               Anticipated d/c date is: 2 days              Patient currently is not medically stable to d/c.           Subjective:   C/o severe nausea and abdominal pain.  Pain is worse with meals.  Nausea is not relieved with Phenergan.  Objective:    Vitals:   09/12/20 0411 09/12/20 0838 09/12/20 1211 09/12/20 1244  BP: 134/73 127/65 (!) 130/59   Pulse: 75 64 (!) 59   Resp: 18 18 18    Temp: 98.4 F (36.9 C) (!) 97.5 F (36.4 C) 97.6 F (36.4 C)   TempSrc: Oral Oral Oral   SpO2: 93% 95%  96%  Weight:      Height:       No data found.   Intake/Output Summary (Last 24 hours) at 09/12/2020 1446 Last data filed at 09/12/2020 0900 Gross per 24 hour  Intake 1514.8 ml  Output 700 ml  Net 814.8 ml   Filed Weights   09/09/20 0851  Weight: 81.6 kg    Exam:  GEN: NAD SKIN: No rash EYES: EOMI ENT: MMM CV: RRR PULM: CTA B ABD: soft, ND, tenderness in the left upper quadrant, left lower quadrant and midabdominal area but no rebound tenderness or guarding. +BS CNS: AAO x 3, non focal EXT: No edema or tenderness     Data Reviewed:   I have personally reviewed following labs and imaging studies:  Labs: Labs show the following:   Basic Metabolic Panel: Recent Labs  Lab 09/09/20 0854 09/09/20 0854 09/10/20 0413 09/11/20 0958 09/12/20 0342  NA 137  --  139  --  142  K 3.5   < > 3.2*  --  4.1  CL 103  --  105  --  110  CO2 25  --  25  --  26  GLUCOSE 123*  --  103*  --  99  BUN 9  --  7*  --  10  CREATININE 0.92  --  0.87  --  0.86  CALCIUM 8.6*  --  8.0*  --  7.7*  MG  --   --   --  2.4  --    < > = values in this interval  not displayed.   GFR Estimated Creatinine Clearance: 47.7 mL/min (by C-G formula based on SCr of 0.86 mg/dL). Liver Function Tests: Recent Labs  Lab 09/09/20 0854  AST 18  ALT 12  ALKPHOS 83  BILITOT 1.0  PROT 7.0  ALBUMIN 3.7   Recent Labs  Lab 09/09/20 0854  LIPASE 25   No results for input(s): AMMONIA in the last 168  hours. Coagulation profile No results for input(s): INR, PROTIME in the last 168 hours.  CBC: Recent Labs  Lab 09/09/20 0854 09/10/20 0413 09/12/20 0342  WBC 12.1* 10.7* 8.9  NEUTROABS  --   --  6.6  HGB 15.2* 14.5 13.4  HCT 45.0 42.9 40.9  MCV 89.3 90.5 92.1  PLT 338 314 307   Cardiac Enzymes: No results for input(s): CKTOTAL, CKMB, CKMBINDEX, TROPONINI in the last 168 hours. BNP (last 3 results) No results for input(s): PROBNP in the last 8760 hours. CBG: No results for input(s): GLUCAP in the last 168 hours. D-Dimer: No results for input(s): DDIMER in the last 72 hours. Hgb A1c: No results for input(s): HGBA1C in the last 72 hours. Lipid Profile: No results for input(s): CHOL, HDL, LDLCALC, TRIG, CHOLHDL, LDLDIRECT in the last 72 hours. Thyroid function studies: No results for input(s): TSH, T4TOTAL, T3FREE, THYROIDAB in the last 72 hours.  Invalid input(s): FREET3 Anemia work up: No results for input(s): VITAMINB12, FOLATE, FERRITIN, TIBC, IRON, RETICCTPCT in the last 72 hours. Sepsis Labs: Recent Labs  Lab 09/09/20 0854 09/09/20 1029 09/09/20 1711 09/10/20 0413 09/12/20 0342  WBC 12.1*  --   --  10.7* 8.9  LATICACIDVEN  --  1.3 1.1  --   --     Microbiology Recent Results (from the past 240 hour(s))  Blood culture (single)     Status: None (Preliminary result)   Collection Time: 09/09/20 10:29 AM   Specimen: BLOOD  Result Value Ref Range Status   Specimen Description BLOOD LEFT ANTECUBITAL  Final   Special Requests   Final    BOTTLES DRAWN AEROBIC AND ANAEROBIC Blood Culture results may not be optimal due to an inadequate volume of blood received in culture bottles   Culture   Final    NO GROWTH 3 DAYS Performed at [facility], [address], [place], [place] [address]    Report Status PENDING  Incomplete  Respiratory Panel by RT PCR (Flu A&B, Covid) - Nasopharyngeal Swab     Status: None   Collection Time: 09/09/20 12:34 PM   Specimen:  Nasopharyngeal Swab  Result Value Ref Range Status   SARS Coronavirus 2 by RT PCR NEGATIVE NEGATIVE Final    Comment: (NOTE) SARS-CoV-2 target nucleic acids are NOT DETECTED.  The SARS-CoV-2 RNA is generally detectable in upper respiratoy specimens during the acute phase of infection. The lowest concentration of SARS-CoV-2 viral copies this assay can detect is 131 copies/mL. A negative result does not preclude SARS-Cov-2 infection and should not be used as the sole basis for treatment or other patient management decisions. A negative result may occur with  improper specimen collection/handling, submission of specimen other than nasopharyngeal swab, presence of viral mutation(s) within the areas targeted by this assay, and inadequate number of viral copies (<131 copies/mL). A negative result must be combined with clinical observations, patient history, and epidemiological information. The expected result is Negative.  Fact Sheet for Patients:  https://www.moore.com/  Fact Sheet for Healthcare Providers:  https://www.young.biz/  This test is no t yet approved or cleared by  the [employer] and  has been authorized for detection and/or diagnosis of SARS-CoV-2 by FDA under an Emergency Use Authorization (EUA). This EUA will remain  in effect (meaning this test can be used) for the duration of the COVID-19 declaration under Section 564(b)(1) of the Act, 21 U.S.C. section 360bbb-3(b)(1), unless the authorization is terminated or revoked sooner.     Influenza A by PCR NEGATIVE NEGATIVE Final   Influenza B by PCR NEGATIVE NEGATIVE Final    Comment: (NOTE) The Xpert Xpress SARS-CoV-2/FLU/RSV assay is intended as an aid in  the diagnosis of influenza from Nasopharyngeal swab specimens and  should not be used as a sole basis for treatment. Nasal washings and  aspirates are unacceptable for Xpert Xpress SARS-CoV-2/FLU/RSV  testing.  Fact Sheet  for Patients: https://www.moore.com/  Fact Sheet for Healthcare Providers: https://www.young.biz/  This test is not yet approved or cleared by the [place] FDA and  has been authorized for detection and/or diagnosis of SARS-CoV-2 by  FDA under an Emergency Use Authorization (EUA). This EUA will remain  in effect (meaning this test can be used) for the duration of the  Covid-19 declaration under Section 564(b)(1) of the Act, 21  U.S.C. section 360bbb-3(b)(1), unless the authorization is  terminated or revoked. Performed at [facility], [address] [place], [place] [address]   Blood culture (single)     Status: None (Preliminary result)   Collection Time: 09/09/20  5:11 PM   Specimen: BLOOD  Result Value Ref Range Status   Specimen Description BLOOD RIGHT ANTECUBITAL  Final   Special Requests   Final    BOTTLES DRAWN AEROBIC AND ANAEROBIC Blood Culture adequate volume   Culture   Final    NO GROWTH 3 DAYS Performed at [facility], [address], [place], [place] [address]    Report Status PENDING  Incomplete    Procedures and diagnostic studies:  No results found.             LOS: 2 days   Chelsia Serres  [facility]   Pager on [email or web address]. If 7PM-7AM, please contact night-coverage at [email or web address]     09/12/2020, 2:46 PM

## 2020-09-13 DIAGNOSIS — K5792 Diverticulitis of intestine, part unspecified, without perforation or abscess without bleeding: Secondary | ICD-10-CM | POA: Diagnosis not present

## 2020-09-13 LAB — CBC WITH DIFFERENTIAL/PLATELET
Abs Immature Granulocytes: 0.02 10*3/uL (ref 0.00–0.07)
Basophils Absolute: 0.1 10*3/uL (ref 0.0–0.1)
Basophils Relative: 1 %
Eosinophils Absolute: 0.6 10*3/uL — ABNORMAL HIGH (ref 0.0–0.5)
Eosinophils Relative: 9 %
HCT: 39.5 % (ref 36.0–46.0)
Hemoglobin: 13 g/dL (ref 12.0–15.0)
Immature Granulocytes: 0 %
Lymphocytes Relative: 26 %
Lymphs Abs: 1.7 10*3/uL (ref 0.7–4.0)
MCH: 30.1 pg (ref 26.0–34.0)
MCHC: 32.9 g/dL (ref 30.0–36.0)
MCV: 91.4 fL (ref 80.0–100.0)
Monocytes Absolute: 0.5 10*3/uL (ref 0.1–1.0)
Monocytes Relative: 7 %
Neutro Abs: 3.7 10*3/uL (ref 1.7–7.7)
Neutrophils Relative %: 57 %
Platelets: 359 10*3/uL (ref 150–400)
RBC: 4.32 MIL/uL (ref 3.87–5.11)
RDW: 13.8 % (ref 11.5–15.5)
WBC: 6.5 10*3/uL (ref 4.0–10.5)
nRBC: 0 % (ref 0.0–0.2)

## 2020-09-13 LAB — BASIC METABOLIC PANEL
Anion gap: 6 (ref 5–15)
BUN: 7 mg/dL — ABNORMAL LOW (ref 8–23)
CO2: 25 mmol/L (ref 22–32)
Calcium: 8 mg/dL — ABNORMAL LOW (ref 8.9–10.3)
Chloride: 108 mmol/L (ref 98–111)
Creatinine, Ser: 0.83 mg/dL (ref 0.44–1.00)
GFR, Estimated: >60 mL/min (ref >60)
Glucose, Bld: 87 mg/dL (ref 70–99)
Potassium: 3.6 mmol/L (ref 3.5–5.1)
Sodium: 139 mmol/L (ref 135–145)

## 2020-09-13 MED ORDER — KCL-LACTATED RINGERS 20 MEQ/L IV SOLN
INTRAVENOUS | Status: DC
  Filled 2020-09-13: qty 1000

## 2020-09-13 MED ORDER — POTASSIUM CHLORIDE 2 MEQ/ML IV SOLN
INTRAVENOUS | Status: DC
  Filled 2020-09-13 (×8): qty 1000

## 2020-09-13 NOTE — [place] FL2 (Signed)
[place] MEDICAID FL2 LEVEL OF CARE SCREENING TOOL     IDENTIFICATION  Patient Name: Marilyn Andrews Birthdate: 1936/03/10 Sex: female Admission Date (Current Location): 09/09/2020  [place] and [place] Number:  Chiropodist and Address:  [facility], [address] [place], [place] [address]      Provider Number:    Attending Physician Name and Address:  Lurene Shadow, MD  Relative Name and Phone Number:  Lelon Huh Kressin [phone]    Current Level of Care: Hospital Recommended Level of Care: Skilled Nursing Facility Prior Approval Number:    Date Approved/Denied:   PASRR Number: Pending  Discharge Plan: SNF    Current Diagnoses: Patient Active Problem List   Diagnosis Date Noted  . GERD (gastroesophageal reflux disease) 09/09/2020  . Depression 09/09/2020  . HTN (hypertension) 03/05/2020  . UTI (urinary tract infection) 07/28/2018  . Severe recurrent major depression without psychotic features (HCC) 03/08/2018  . Acute diverticulitis 03/07/2018  . Diverticulitis 07/01/2016    Orientation RESPIRATION BLADDER Height & Weight     Self, Place    Incontinent, External catheter Weight: 180 lb (81.6 kg) Height:  5' (152.4 cm)  BEHAVIORAL SYMPTOMS/MOOD NEUROLOGICAL BOWEL NUTRITION STATUS      Incontinent    AMBULATORY STATUS COMMUNICATION OF NEEDS Skin   Extensive Assist                           Personal Care Assistance Level of Assistance  Bathing, Feeding, Total care, Dressing Bathing Assistance: Limited assistance Feeding assistance: Limited assistance Dressing Assistance: Limited assistance Total Care Assistance: Maximum assistance   Functional Limitations Info             SPECIAL CARE FACTORS FREQUENCY  PT (By licensed PT), OT (By licensed OT)     PT Frequency: 5 x weekly OT Frequency: 5 x weekly            Contractures      Additional Factors Info  Code Status Code Status Info: DNR              Current Medications (09/13/2020):  This is the current hospital active medication list Current Facility-Administered Medications  Medication Dose Route Frequency Provider Last Rate Last Admin  . 0.9 %  sodium chloride infusion   Intravenous PRN Lorretta Harp, MD   Stopped at 09/11/20 0945  . acetaminophen (TYLENOL) tablet 650 mg  650 mg Oral Q6H PRN Charise Killian, MD      . amLODipine (NORVASC) tablet 5 mg  5 mg Oral Daily Lorretta Harp, MD   5 mg at 09/13/20 0811  . docusate sodium (COLACE) capsule 200 mg  200 mg Oral BID PRN Lorretta Harp, MD      . enoxaparin (LOVENOX) injection 40 mg  0.5 mg/kg Subcutaneous Q24H Katha Cabal, RPH   40 mg at 09/12/20 2117  . escitalopram (LEXAPRO) tablet 20 mg  20 mg Oral Daily Lorretta Harp, MD   20 mg at 09/13/20 7124  . fluticasone (FLONASE) 50 MCG/ACT nasal spray 2 spray  2 spray Each Nare BID PRN Lorretta Harp, MD      . hydrALAZINE (APRESOLINE) tablet 25 mg  25 mg Oral TID PRN Lorretta Harp, MD      . HYDROmorphone (DILAUDID) injection 0.5 mg  0.5 mg Intravenous Q3H PRN Lurene Shadow, MD      . hydrOXYzine (VISTARIL) injection 25 mg  25 mg Intramuscular Q6H PRN Clyde Lundborg,  Brien Few, MD   25 mg at 09/12/20 1159  . lactated ringers 1,000 mL with potassium chloride 20 mEq infusion   Intravenous Continuous Lurene Shadow, MD      . lactulose (CHRONULAC) 10 GM/15ML solution 20 g  20 g Oral Daily Lorretta Harp, MD   20 g at 09/13/20 9357  . LORazepam (ATIVAN) tablet 0.5 mg  0.5 mg Oral BID PRN Lorretta Harp, MD      . meropenem (MERREM) 1 g in sodium chloride 0.9 % 100 mL IVPB  1 g Intravenous Q12H Lowella Bandy, RPH 200 mL/hr at 09/13/20 0817 1 g at 09/13/20 0817  . ondansetron (ZOFRAN) injection 4 mg  4 mg Intravenous Q6H PRN Lurene Shadow, MD   4 mg at 09/12/20 2324  . oxyCODONE-acetaminophen (PERCOCET/ROXICET) 5-325 MG per tablet 1 tablet  1 tablet Oral Q4H PRN Lorretta Harp, MD   1 tablet at 09/13/20 0751  . pantoprazole (PROTONIX) EC tablet 40 mg  40 mg Oral Daily Lorretta Harp, MD   40 mg at 09/13/20 0811  . promethazine (PHENERGAN) injection 6.25 mg  6.25 mg Intravenous Q6H PRN Lurene Shadow, MD   6.25 mg at 09/12/20 0900     Discharge Medications: Please see discharge summary for a list of discharge medications.  Relevant Imaging Results:  Relevant Lab Results:   Additional Information SS# [id number]  Eilleen Kempf, LCSW

## 2020-09-13 NOTE — Progress Notes (Addendum)
Progress Note    [name]  YME:[id number] DOB: 11-06-1935  DOA: 09/09/2020 PCP: [facility]      Brief Narrative:    Medical records reviewed and are as summarized below:  Marilyn Andrews is a 84 y.o. female with medical history significant for hypertension, GERD, depression, anxiety, impaired hearing, diverticulitis, who presented to the hospital with severe abdominal pain.  Abdominal pain has been going on for almost 2 weeks but pain has progressively worsened.  She was admitted to the hospital for acute diverticulitis.  She was treated with empiric IV antibiotics, IV fluids, analgesics and antiemetics.    Assessment/Plan:   Principal Problem:   Acute diverticulitis Active Problems:   Diverticulitis   HTN (hypertension)   GERD (gastroesophageal reflux disease)   Depression   Body mass index is 35.15 kg/m.  (Obesity)   Acute diverticulitis: Continue IV meropenem.  Analgesics (IV Dilaudid) and antiemetics (Zofran and Phenergan) as needed for pain and nausea respectively.  Restart IV fluids because of poor oral intake.   Hypokalemia: Improved  Prolonged QTc interval (509) on admission: Repeat EKG  Hypertension: Continue antihypertensives  Depression and anxiety: Patient says she is not suicidal.  Continue citalopram.  GERD: Continue Protonix  PT recommends discharge to SNF.   Diet Order            Diet clear liquid Room service appropriate? Yes; Fluid consistency: Thin  Diet effective now                    Consultants:  None  Procedures:  None    Medications:   . amLODipine  5 mg Oral Daily  . enoxaparin (LOVENOX) injection  0.5 mg/kg Subcutaneous Q24H  . escitalopram  20 mg Oral Daily  . lactulose  20 g Oral Daily  . pantoprazole  40 mg Oral Daily   Continuous Infusions: . sodium chloride Stopped (09/11/20 0945)  . lactated ringers with KCl 20 mEq/L    . meropenem (MERREM) IV 1 g (09/13/20 0817)      Anti-infectives (From admission, onward)   Start     Dose/Rate Route Frequency Ordered Stop   09/12/20 1145  meropenem (MERREM) 1 g in sodium chloride 0.9 % 100 mL IVPB        1 g 200 mL/hr over 30 Minutes Intravenous Every 12 hours 09/12/20 1047     09/12/20 1130  meropenem (MERREM) 1 g in sodium chloride 0.9 % 100 mL IVPB  Status:  Discontinued        1 g 200 mL/hr over 30 Minutes Intravenous Every 8 hours 09/12/20 1043 09/12/20 1047   09/11/20 1800  metroNIDAZOLE (FLAGYL) tablet 500 mg  Status:  Discontinued        500 mg Oral Every 8 hours 09/11/20 1133 09/12/20 1043   09/10/20 1100  cefTRIAXone (ROCEPHIN) 2 g in sodium chloride 0.9 % 100 mL IVPB  Status:  Discontinued        2 g 200 mL/hr over 30 Minutes Intravenous Every 24 hours 09/09/20 1247 09/12/20 1043   09/09/20 1800  metroNIDAZOLE (FLAGYL) IVPB 500 mg  Status:  Discontinued        500 mg 100 mL/hr over 60 Minutes Intravenous Every 8 hours 09/09/20 1247 09/11/20 1133   09/09/20 1130  cefTRIAXone (ROCEPHIN) 2 g in sodium chloride 0.9 % 100 mL IVPB        2 g 200 mL/hr over 30 Minutes Intravenous  Once  09/09/20 1120 09/09/20 1233   09/09/20 1130  metroNIDAZOLE (FLAGYL) IVPB 500 mg        500 mg 100 mL/hr over 60 Minutes Intravenous  Once 09/09/20 1120 09/09/20 1455             Family Communication/Anticipated D/C date and plan/Code Status   DVT prophylaxis: enoxaparin (LOVENOX) injection 40 mg Start: 09/09/20 2200     Code Status: DNR  Family Communication: None Disposition Plan:    Status is: Inpatient  Remains inpatient appropriate because:IV treatments appropriate due to intensity of illness or inability to take PO   Dispo: The patient is from: Home              Anticipated d/c is to: Home              Anticipated d/c date is: 2 days              Patient currently is not medically stable to d/c.           Subjective:   C/o nausea but no vomiting.  She said her pain is a little  better because she had just received pain medicine.  She said she is not drinking much.  No other complaints.  Objective:    Vitals:   09/12/20 1734 09/12/20 1920 09/12/20 2307 09/13/20 0426  BP: (!) 143/71 (!) 116/55 (!) 141/61 (!) 128/57  Pulse: (!) 58 61 (!) 58 60  Resp: 20 20 18 20   Temp: 97.7 F (36.5 C) 98 F (36.7 C) 99.1 F (37.3 C) 98.1 F (36.7 C)  TempSrc: Oral Oral Oral Oral  SpO2: 92% 96% 91% 94%  Weight:      Height:       No data found.   Intake/Output Summary (Last 24 hours) at 09/13/2020 1024 Last data filed at 09/13/2020 0457 Gross per 24 hour  Intake 455.53 ml  Output 300 ml  Net 155.53 ml   Filed Weights   09/09/20 0851  Weight: 81.6 kg    Exam:   GEN: NAD SKIN: No rash EYES: EOMI ENT: MMM CV: RRR PULM: CTA B ABD: soft, ND,  Mild tenderness in the LLQ, RLQ and mid abdomen, +BS CNS: AAO x 3, non focal EXT: No edema or tenderness PSYCH: Flat affect     Data Reviewed:   I have personally reviewed following labs and imaging studies:  Labs: Labs show the following:   Basic Metabolic Panel: Recent Labs  Lab 09/09/20 0854 09/09/20 0854 09/10/20 0413 09/10/20 0413 09/11/20 0958 09/12/20 0342 09/13/20 0426  NA 137  --  139  --   --  142 139  K 3.5   < > 3.2*   < >  --  4.1 3.6  CL 103  --  105  --   --  110 108  CO2 25  --  25  --   --  26 25  GLUCOSE 123*  --  103*  --   --  99 87  BUN 9  --  7*  --   --  10 7*  CREATININE 0.92  --  0.87  --   --  0.86 0.83  CALCIUM 8.6*  --  8.0*  --   --  7.7* 8.0*  MG  --   --   --   --  2.4  --   --    < > = values in this interval not displayed.   GFR Estimated Creatinine Clearance:  49.4 mL/min (by C-G formula based on SCr of 0.83 mg/dL). Liver Function Tests: Recent Labs  Lab 09/09/20 0854  AST 18  ALT 12  ALKPHOS 83  BILITOT 1.0  PROT 7.0  ALBUMIN 3.7   Recent Labs  Lab 09/09/20 0854  LIPASE 25   No results for input(s): AMMONIA in the last 168 hours. Coagulation  profile No results for input(s): INR, PROTIME in the last 168 hours.  CBC: Recent Labs  Lab 09/09/20 0854 09/10/20 0413 09/12/20 0342 09/13/20 0426  WBC 12.1* 10.7* 8.9 6.5  NEUTROABS  --   --  6.6 3.7  HGB 15.2* 14.5 13.4 13.0  HCT 45.0 42.9 40.9 39.5  MCV 89.3 90.5 92.1 91.4  PLT 338 314 307 359   Cardiac Enzymes: No results for input(s): CKTOTAL, CKMB, CKMBINDEX, TROPONINI in the last 168 hours. BNP (last 3 results) No results for input(s): PROBNP in the last 8760 hours. CBG: No results for input(s): GLUCAP in the last 168 hours. D-Dimer: No results for input(s): DDIMER in the last 72 hours. Hgb A1c: No results for input(s): HGBA1C in the last 72 hours. Lipid Profile: No results for input(s): CHOL, HDL, LDLCALC, TRIG, CHOLHDL, LDLDIRECT in the last 72 hours. Thyroid function studies: No results for input(s): TSH, T4TOTAL, T3FREE, THYROIDAB in the last 72 hours.  Invalid input(s): FREET3 Anemia work up: No results for input(s): VITAMINB12, FOLATE, FERRITIN, TIBC, IRON, RETICCTPCT in the last 72 hours. Sepsis Labs: Recent Labs  Lab 09/09/20 0854 09/09/20 1029 09/09/20 1711 09/10/20 0413 09/12/20 0342 09/13/20 0426  WBC 12.1*  --   --  10.7* 8.9 6.5  LATICACIDVEN  --  1.3 1.1  --   --   --     Microbiology Recent Results (from the past 240 hour(s))  Blood culture (single)     Status: None (Preliminary result)   Collection Time: 09/09/20 10:29 AM   Specimen: BLOOD  Result Value Ref Range Status   Specimen Description BLOOD LEFT ANTECUBITAL  Final   Special Requests   Final    BOTTLES DRAWN AEROBIC AND ANAEROBIC Blood Culture results may not be optimal due to an inadequate volume of blood received in culture bottles   Culture   Final    NO GROWTH 4 DAYS Performed at [facility], [address] [place], [place] [address]    Report Status PENDING  Incomplete  Respiratory Panel by RT PCR (Flu A&B, Covid) - Nasopharyngeal Swab     Status: None    Collection Time: 09/09/20 12:34 PM   Specimen: Nasopharyngeal Swab  Result Value Ref Range Status   SARS Coronavirus 2 by RT PCR NEGATIVE NEGATIVE Final    Comment: (NOTE) SARS-CoV-2 target nucleic acids are NOT DETECTED.  The SARS-CoV-2 RNA is generally detectable in upper respiratoy specimens during the acute phase of infection. The lowest concentration of SARS-CoV-2 viral copies this assay can detect is 131 copies/mL. A negative result does not preclude SARS-Cov-2 infection and should not be used as the sole basis for treatment or other patient management decisions. A negative result may occur with  improper specimen collection/handling, submission of specimen other than nasopharyngeal swab, presence of viral mutation(s) within the areas targeted by this assay, and inadequate number of viral copies (<131 copies/mL). A negative result must be combined with clinical observations, patient history, and epidemiological information. The expected result is Negative.  Fact Sheet for Patients:  https://www.moore.com/  Fact Sheet for Healthcare Providers:  https://www.young.biz/  This test is no  t yet approved or cleared by the [place] FDA and  has been authorized for detection and/or diagnosis of SARS-CoV-2 by FDA under an Emergency Use Authorization (EUA). This EUA will remain  in effect (meaning this test can be used) for the duration of the COVID-19 declaration under Section 564(b)(1) of the Act, 21 U.S.C. section 360bbb-3(b)(1), unless the authorization is terminated or revoked sooner.     Influenza A by PCR NEGATIVE NEGATIVE Final   Influenza B by PCR NEGATIVE NEGATIVE Final    Comment: (NOTE) The Xpert Xpress SARS-CoV-2/FLU/RSV assay is intended as an aid in  the diagnosis of influenza from Nasopharyngeal swab specimens and  should not be used as a sole basis for treatment. Nasal washings and  aspirates are unacceptable for Xpert  Xpress SARS-CoV-2/FLU/RSV  testing.  Fact Sheet for Patients: https://www.moore.com/https://www.fda.gov/media/142436/download  Fact Sheet for Healthcare Providers: https://www.young.biz/https://www.fda.gov/media/142435/download  This test is not yet approved or cleared by the [place] FDA and  has been authorized for detection and/or diagnosis of SARS-CoV-2 by  FDA under an Emergency Use Authorization (EUA). This EUA will remain  in effect (meaning this test can be used) for the duration of the  Covid-19 declaration under Section 564(b)(1) of the Act, 21  U.S.C. section 360bbb-3(b)(1), unless the authorization is  terminated or revoked. Performed at [facility], [address] [place], [place] [address]   Blood culture (single)     Status: None (Preliminary result)   Collection Time: 09/09/20  5:11 PM   Specimen: BLOOD  Result Value Ref Range Status   Specimen Description BLOOD RIGHT ANTECUBITAL  Final   Special Requests   Final    BOTTLES DRAWN AEROBIC AND ANAEROBIC Blood Culture adequate volume   Culture   Final    NO GROWTH 4 DAYS Performed at [facility], [address] [place], [place] [address]    Report Status PENDING  Incomplete    Procedures and diagnostic studies:  No results found.             LOS: 3 days   Loukas Antonson  [facility]   Pager on [email or web address]. If 7PM-7AM, please contact night-coverage at [email or web address]     09/13/2020, 10:24 AM

## 2020-09-13 NOTE — Progress Notes (Signed)
PT Cancellation Note  Patient Details Name: Maritza Hosterman Salahuddin MRN: [id number] DOB: [birth year]   Cancelled Treatment:    Reason Eval/Treat Not Completed: Other (comment) (Patient declined PT despite education and encouragement. Verbalized that she did not feel well and was unhappy with the amount of people that have been in and out of her room today.)   Olga Coaster PT, DPT 12:36 PM,09/13/20

## 2020-09-13 NOTE — Progress Notes (Signed)
OT Cancellation Note  Patient Details Name: Marilyn Andrews MRN: [id number] DOB: Jun 23, 1936   Cancelled Treatment:    Reason Eval/Treat Not Completed: Patient declined, no reason specified. Pt refuses mobility, grooming, and setup for breakfast this AM. Pt reports pain medicine causing increased nausea and unwilling to attempt. Will follow up at later date/time as able.   Kathie Dike, M.S. OTR/L  09/13/20, 1:47 PM  ascom [phone]

## 2020-09-13 NOTE — Care Management Important Message (Signed)
Important Message  Patient Details  Name: Marilyn Andrews MRN: [id number] Date of Birth: 1936/07/18   Medicare Important Message Given:  Yes     [name] 09/13/2020, 12:16 PM

## 2020-09-13 NOTE — Plan of Care (Signed)
Continuing with plan of care. 

## 2020-09-14 DIAGNOSIS — K5792 Diverticulitis of intestine, part unspecified, without perforation or abscess without bleeding: Secondary | ICD-10-CM | POA: Diagnosis not present

## 2020-09-14 LAB — CULTURE, BLOOD (SINGLE)
Culture: NO GROWTH
Culture: NO GROWTH
Special Requests: ADEQUATE

## 2020-09-14 NOTE — Progress Notes (Signed)
Progress Note    Marilyn Millerseggy [place]  AVW:[id number] DOB: 12/06/1935  DOA: 09/09/2020 [facility]CP: [facility]      Brief Narrative:    Medical records reviewed and are as summarized below:  Marilyn Andrews is a 84 y.o. female with medical history significant for hypertension, GERD, depression, anxiety, impaired hearing, diverticulitis, who presented to the hospital with severe abdominal pain.  Abdominal pain has been going on for almost 2 weeks but pain has progressively worsened.  She was admitted to the hospital for acute diverticulitis.  She was treated with empiric IV antibiotics, IV fluids, analgesics and antiemetics.    Assessment/Plan:   Principal Problem:   Acute diverticulitis Active Problems:   Diverticulitis   HTN (hypertension)   GERD (gastroesophageal reflux disease)   Depression   Body mass index is 35.15 kg/m.  (Obesity)   Acute diverticulitis: Continue IV meropenem, analgesics and antiemetics.  Discontinue IV fluids.  Advance diet to heart healthy diet.  Hypokalemia: Improved  Prolonged QTc interval (509) on admission: Repeat EKG today showed QTC 454  Hypertension: Continue antihypertensives  Depression and anxiety: Continue Escitalopram  GERD: Continue Protonix  PT recommends discharge to SNF.   Diet Order            Diet Heart Room service appropriate? Yes; Fluid consistency: Thin  Diet effective now                    Consultants:  None  Procedures:  None    Medications:   . amLODipine  5 mg Oral Daily  . enoxaparin (LOVENOX) injection  0.5 mg/kg Subcutaneous Q24H  . escitalopram  20 mg Oral Daily  . lactulose  20 g Oral Daily  . pantoprazole  40 mg Oral Daily   Continuous Infusions: . sodium chloride Stopped (09/11/20 0945)  . lactated ringers 1,000 mL with potassium chloride 20 mEq infusion 75 mL/hr at 09/14/20 0303  . meropenem (MERREM) IV 1 g (09/14/20 0906)     Anti-infectives (From admission,  onward)   Start     Dose/Rate Route Frequency Ordered Stop   09/12/20 1145  meropenem (MERREM) 1 g in sodium chloride 0.9 % 100 mL IVPB        1 g 200 mL/hr over 30 Minutes Intravenous Every 12 hours 09/12/20 1047     09/12/20 1130  meropenem (MERREM) 1 g in sodium chloride 0.9 % 100 mL IVPB  Status:  Discontinued        1 g 200 mL/hr over 30 Minutes Intravenous Every 8 hours 09/12/20 1043 09/12/20 1047   09/11/20 1800  metroNIDAZOLE (FLAGYL) tablet 500 mg  Status:  Discontinued        500 mg Oral Every 8 hours 09/11/20 1133 09/12/20 1043   09/10/20 1100  cefTRIAXone (ROCEPHIN) 2 g in sodium chloride 0.9 % 100 mL IVPB  Status:  Discontinued        2 g 200 mL/hr over 30 Minutes Intravenous Every 24 hours 09/09/20 1247 09/12/20 1043   09/09/20 1800  metroNIDAZOLE (FLAGYL) IVPB 500 mg  Status:  Discontinued        500 mg 100 mL/hr over 60 Minutes Intravenous Every 8 hours 09/09/20 1247 09/11/20 1133   09/09/20 1130  cefTRIAXone (ROCEPHIN) 2 g in sodium chloride 0.9 % 100 mL IVPB        2 g 200 mL/hr over 30 Minutes Intravenous  Once 09/09/20 1120 09/09/20 1233   09/09/20 1130  metroNIDAZOLE (FLAGYL) IVPB 500 mg        500 mg 100 mL/hr over 60 Minutes Intravenous  Once 09/09/20 1120 09/09/20 1455             Family Communication/Anticipated D/C date and plan/Code Status   DVT prophylaxis: enoxaparin (LOVENOX) injection 40 mg Start: 09/09/20 2200     Code Status: DNR  Family Communication: None Disposition Plan:    Status is: Inpatient  Remains inpatient appropriate because:IV treatments appropriate due to intensity of illness or inability to take PO   Dispo: The patient is from: Home              Anticipated d/c is to: Home              Anticipated d/c date is: 2 days              Patient currently is not medically stable to d/c.           Subjective:   She still has some nausea and abdominal pain but she thinks that she is improving.  She wants to try a  regular diet.  Objective:    Vitals:   09/13/20 1152 09/13/20 1937 09/13/20 2340 09/14/20 0337  BP: (!) 142/60 (!) 159/61 (!) 134/56 (!) 141/60  Pulse: 61 77 64 68  Resp: 16 20 20 19   Temp: 97.6 F (36.4 C) 99.3 F (37.4 C) 98.5 F (36.9 C) 98.7 F (37.1 C)  TempSrc: Oral Oral Oral Oral  SpO2: 95% 93% 94% 93%  Weight:      Height:       No data found.   Intake/Output Summary (Last 24 hours) at 09/14/2020 1354 Last data filed at 09/14/2020 1200 Gross per 24 hour  Intake 2228.22 ml  Output 1150 ml  Net 1078.22 ml   Filed Weights   09/09/20 0851  Weight: 81.6 kg    Exam:  GEN: NAD SKIN: Warm and dry EYES: No pallor or icterus ENT: MMM CV: RRR PULM: CTA B ABD: soft, ND, mild tenderness in the left lower quadrant and mid upper abdomen, +BS CNS: AAO x 3, non focal EXT: No edema or tenderness      Data Reviewed:   I have personally reviewed following labs and imaging studies:  Labs: Labs show the following:   Basic Metabolic Panel: Recent Labs  Lab 09/09/20 0854 09/09/20 0854 09/10/20 0413 09/10/20 0413 09/11/20 0958 09/12/20 0342 09/13/20 0426  NA 137  --  139  --   --  142 139  K 3.5   < > 3.2*   < >  --  4.1 3.6  CL 103  --  105  --   --  110 108  CO2 25  --  25  --   --  26 25  GLUCOSE 123*  --  103*  --   --  99 87  BUN 9  --  7*  --   --  10 7*  CREATININE 0.92  --  0.87  --   --  0.86 0.83  CALCIUM 8.6*  --  8.0*  --   --  7.7* 8.0*  MG  --   --   --   --  2.4  --   --    < > = values in this interval not displayed.   GFR Estimated Creatinine Clearance: 49.4 mL/min (by C-G formula based on SCr of 0.83 mg/dL). Liver Function Tests: Recent Labs  Lab 09/09/20  0854  AST 18  ALT 12  ALKPHOS 83  BILITOT 1.0  PROT 7.0  ALBUMIN 3.7   Recent Labs  Lab 09/09/20 0854  LIPASE 25   No results for input(s): AMMONIA in the last 168 hours. Coagulation profile No results for input(s): INR, PROTIME in the last 168 hours.  CBC: Recent  Labs  Lab 09/09/20 0854 09/10/20 0413 09/12/20 0342 09/13/20 0426  WBC 12.1* 10.7* 8.9 6.5  NEUTROABS  --   --  6.6 3.7  HGB 15.2* 14.5 13.4 13.0  HCT 45.0 42.9 40.9 39.5  MCV 89.3 90.5 92.1 91.4  PLT 338 314 307 359   Cardiac Enzymes: No results for input(s): CKTOTAL, CKMB, CKMBINDEX, TROPONINI in the last 168 hours. BNP (last 3 results) No results for input(s): PROBNP in the last 8760 hours. CBG: No results for input(s): GLUCAP in the last 168 hours. D-Dimer: No results for input(s): DDIMER in the last 72 hours. Hgb A1c: No results for input(s): HGBA1C in the last 72 hours. Lipid Profile: No results for input(s): CHOL, HDL, LDLCALC, TRIG, CHOLHDL, LDLDIRECT in the last 72 hours. Thyroid function studies: No results for input(s): TSH, T4TOTAL, T3FREE, THYROIDAB in the last 72 hours.  Invalid input(s): FREET3 Anemia work up: No results for input(s): VITAMINB12, FOLATE, FERRITIN, TIBC, IRON, RETICCTPCT in the last 72 hours. Sepsis Labs: Recent Labs  Lab 09/09/20 0854 09/09/20 1029 09/09/20 1711 09/10/20 0413 09/12/20 0342 09/13/20 0426  WBC 12.1*  --   --  10.7* 8.9 6.5  LATICACIDVEN  --  1.3 1.1  --   --   --     Microbiology Recent Results (from the past 240 hour(s))  Blood culture (single)     Status: None   Collection Time: 09/09/20 10:29 AM   Specimen: BLOOD  Result Value Ref Range Status   Specimen Description BLOOD LEFT ANTECUBITAL  Final   Special Requests   Final    BOTTLES DRAWN AEROBIC AND ANAEROBIC Blood Culture results may not be optimal due to an inadequate volume of blood received in culture bottles   Culture   Final    NO GROWTH 5 DAYS Performed at [facility], [address], [place], [place] [address]    Report Status 09/14/2020 FINAL  Final  Respiratory Panel by RT PCR (Flu A&B, Covid) - Nasopharyngeal Swab     Status: None   Collection Time: 09/09/20 12:34 PM   Specimen: Nasopharyngeal Swab  Result Value Ref Range Status    SARS Coronavirus 2 by RT PCR NEGATIVE NEGATIVE Final    Comment: (NOTE) SARS-CoV-2 target nucleic acids are NOT DETECTED.  The SARS-CoV-2 RNA is generally detectable in upper respiratoy specimens during the acute phase of infection. The lowest concentration of SARS-CoV-2 viral copies this assay can detect is 131 copies/mL. A negative result does not preclude SARS-Cov-2 infection and should not be used as the sole basis for treatment or other patient management decisions. A negative result may occur with  improper specimen collection/handling, submission of specimen other than nasopharyngeal swab, presence of viral mutation(s) within the areas targeted by this assay, and inadequate number of viral copies (<131 copies/mL). A negative result must be combined with clinical observations, patient history, and epidemiological information. The expected result is Negative.  Fact Sheet for Patients:  https://www.moore.com/  Fact Sheet for Healthcare Providers:  https://www.young.biz/  This test is no t yet approved or cleared by the [place] FDA and  has been authorized for detection and/or diagnosis of  SARS-CoV-2 by FDA under an Emergency Use Authorization (EUA). This EUA will remain  in effect (meaning this test can be used) for the duration of the COVID-19 declaration under Section 564(b)(1) of the Act, 21 U.S.C. section 360bbb-3(b)(1), unless the authorization is terminated or revoked sooner.     Influenza A by PCR NEGATIVE NEGATIVE Final   Influenza B by PCR NEGATIVE NEGATIVE Final    Comment: (NOTE) The Xpert Xpress SARS-CoV-2/FLU/RSV assay is intended as an aid in  the diagnosis of influenza from Nasopharyngeal swab specimens and  should not be used as a sole basis for treatment. Nasal washings and  aspirates are unacceptable for Xpert Xpress SARS-CoV-2/FLU/RSV  testing.  Fact Sheet for  Patients: https://www.moore.com/  Fact Sheet for Healthcare Providers: https://www.young.biz/  This test is not yet approved or cleared by the [place] FDA and  has been authorized for detection and/or diagnosis of SARS-CoV-2 by  FDA under an Emergency Use Authorization (EUA). This EUA will remain  in effect (meaning this test can be used) for the duration of the  Covid-19 declaration under Section 564(b)(1) of the Act, 21  U.S.C. section 360bbb-3(b)(1), unless the authorization is  terminated or revoked. Performed at [facility], [address] [place], [place] [address]   Blood culture (single)     Status: None   Collection Time: 09/09/20  5:11 PM   Specimen: BLOOD  Result Value Ref Range Status   Specimen Description BLOOD RIGHT ANTECUBITAL  Final   Special Requests   Final    BOTTLES DRAWN AEROBIC AND ANAEROBIC Blood Culture adequate volume   Culture   Final    NO GROWTH 5 DAYS Performed at [facility], [address] [place], [place] [address]    Report Status 09/14/2020 FINAL  Final    Procedures and diagnostic studies:  No results found.             LOS: 4 days   Shaely Gadberry  [facility]   Pager on [email or web address]. If 7PM-7AM, please contact night-coverage at [email or web address]     09/14/2020, 1:54 PM

## 2020-09-14 NOTE — Progress Notes (Signed)
Physical Therapy Treatment Patient Details Name: Marilyn Andrews Certain MRN: [id number] DOB: 1936-07-26 Today's Date: 09/14/2020    History of Present Illness Marilyn Andrews is a 84 y.o. female with medical history significant of hypertension, GERD, depression, anxiety, hard of hearing, diverticulitis, who presents with abdominal pain. Patient states that she has abdominal pain which has been going on for almost 2 weeks, and progressively worsening.  Her abdominal pain is located in the bilateral lower abdomen, constant, sharp, moderate, nonradiating.  She also has nausea and mild vomiting for few times, no diarrhea.  Patient states that she is mildly constipated.  No fever or chills.  Patient does not have chest pain, cough, shortness breath, symptoms of UTI or unilateral weakness. Patient has a burning on urination, no dysuria or hematuria    PT Comments    Pt agreeable to tx but seems to be limited by [facility] on this date. Pt can complete bed mobility & transfers with little assistance (supervision<>CGA) with RW on this date but self limits & declines further ambulation. Pt performs BLE LAQ & ankle pumps 1 set x 10 reps. PT assists pt with grooming (hair). Provided pt with drink upon request. Pt encouraged to participate & ambulate during future sessions & pt voices understanding. Will continue to follow pt acutely to focus on deficits noted above.    Follow Up Recommendations  SNF     Equipment Recommendations  Other (comment) (TBD in next venue)    Recommendations for Other Services       Precautions / Restrictions Precautions Precautions: Fall Precaution Comments: DNR Restrictions Weight Bearing Restrictions: No    Mobility  Bed Mobility Overal bed mobility: Needs Assistance Bed Mobility: Supine to Sit     Supine to sit: Supervision;HOB elevated     General bed mobility comments: cuing to use bed rails, extra time to complete  Transfers Overall transfer level: Needs  assistance Equipment used: Rolling walker (2 wheeled) Transfers: Sit to/from [employer] Sit to Stand: Min guard Stand pivot transfers: Min assist       General transfer comment: cuing for safe hand placement with use of RW, stand pivot to recliner with RW  Ambulation/Gait Ambulation/Gait assistance:  (pt declines, encouraged pt to ambulate during next session)               Stairs             Wheelchair Mobility    Modified Rankin (Stroke Patients Only)       Balance Overall balance assessment: Needs assistance Sitting-balance support: Single extremity supported;Feet unsupported Sitting balance-Leahy Scale: Fair Sitting balance - Comments: sitting EOB without BLE support & no LOB noted Postural control: Posterior lean Standing balance support: Bilateral upper extremity supported;During functional activity Standing balance-Leahy Scale: Poor Standing balance comment: BUE support on RW, slight posterior lean but pt able to correct                            Cognition Arousal/Alertness: Awake/alert Behavior During Therapy: Flat affect Overall Cognitive Status: No family/caregiver present to determine baseline cognitive functioning                                        Exercises      General Comments General comments (skin integrity, edema, etc.): Pt c/o "wooziness" after transferring to recliner but Charity fundraiser  in room & reports it could be due to IV medication she just administered      Pertinent Vitals/Pain Pain Assessment: 0-10 Pain Score: 7  Pain Location: "stomach, back & side" Pain Descriptors / Indicators: Sore Pain Intervention(s): Limited activity within patient's tolerance;RN gave pain meds during session    Home Living                      Prior Function            PT Goals (current goals can now be found in the care plan section) Acute Rehab PT Goals Patient Stated Goal: to get home to her  dog Coco PT Goal Formulation: With patient Time For Goal Achievement: 09/26/20 Potential to Achieve Goals: Fair Progress towards PT goals: Progressing toward goals    Frequency    Min 2X/week      PT Plan Current plan remains appropriate    Co-evaluation              AM-PAC PT "6 Clicks" Mobility   Outcome Measure  Help needed turning from your back to your side while in a flat bed without using bedrails?: None Help needed moving from lying on your back to sitting on the side of a flat bed without using bedrails?: None Help needed moving to and from a bed to a chair (including a wheelchair)?: A Little Help needed standing up from a chair using your arms (e.g., wheelchair or bedside chair)?: A Little Help needed to walk in hospital room?: A Little Help needed climbing 3-5 steps with a railing? : A Lot 6 Click Score: 19    End of Session Equipment Utilized During Treatment: Gait belt Activity Tolerance: Patient tolerated treatment well (self limiting) Patient left: in chair;with call bell/phone within reach;with chair alarm set Nurse Communication: Mobility status;Patient requests pain meds PT Visit Diagnosis: Unsteadiness on feet (R26.81);Other abnormalities of gait and mobility (R26.89);Muscle weakness (generalized) (M62.81);Difficulty in walking, not elsewhere classified (R26.2);Dizziness and giddiness (R42);Pain     Time: 1534-1600 PT Time Calculation (min) (ACUTE ONLY): 26 min  Charges:  $Therapeutic Activity: 23-37 mins                     Aleda Grana, PT, DPT 09/14/20, 4:20 PM    Sandi Mariscal 09/14/2020, 4:19 PM

## 2020-09-14 NOTE — Plan of Care (Signed)
Continuing with plan of care. 

## 2020-09-15 DIAGNOSIS — K5792 Diverticulitis of intestine, part unspecified, without perforation or abscess without bleeding: Secondary | ICD-10-CM | POA: Diagnosis not present

## 2020-09-15 NOTE — Progress Notes (Signed)
Physical Therapy Treatment Patient Details Name: Marilyn Andrews MRN: [id number] DOB: [birth year] Today's Date: 09/15/2020    History of Present Illness Marilyn Andrews is a 84 y.o. female with medical history significant of hypertension, GERD, depression, anxiety, hard of hearing, diverticulitis, who presents with abdominal pain. Patient states that she has abdominal pain which has been going on for almost 2 weeks, and progressively worsening.  Her abdominal pain is located in the bilateral lower abdomen, constant, sharp, moderate, nonradiating.  She also has nausea and mild vomiting for few times, no diarrhea.  Patient states that she is mildly constipated.  No fever or chills.  Patient does not have chest pain, cough, shortness breath, symptoms of UTI or unilateral weakness. Patient has a burning on urination, no dysuria or hematuria    PT Comments    Pt received asleep in bed, awakened & initially agreeable to tx. PT encouraged pt to get OOB to recliner & ambulate but eventually pt begins to adamantly decline. PT observed bed to be soiled & encouraged pt to sit on EOB to allow PT to change pad but pt declines, reporting she will only roll to allow pad to be changed. Pt performs rolling L<>R mod I, but does require significantly extra time to scoot to [facility] with bed rails. Pt then performs hip adduction squeezes but declines after performing 4 repetitions 2/2 "it's pulling in there" referencing LLE. Pt then performs 5 repetitions of heel slides LLE before declining further participation.  Anticipate pt can mobilize well but pt continues to significantly self limit herself.  Pt does c/o not feeling well, requesting anxiety, nausea & pain medication from nurse - PT does attempt to distract pt during session.    Follow Up Recommendations  SNF     Equipment Recommendations   (TBD in next venue)    Recommendations for Other Services       Precautions / Restrictions Precautions Precautions:  Fall Precaution Comments: DNR Restrictions Weight Bearing Restrictions: No    Mobility  Bed Mobility Overal bed mobility: Needs Assistance Bed Mobility: Rolling Rolling: Modified independent (Device/Increase time)         General bed mobility comments: pt requires supervision to scoot to [facility] with bed in trendelenburg position & use of bed rails  Transfers                    Ambulation/Gait                 Stairs             Wheelchair Mobility    Modified Rankin (Stroke Patients Only)       Balance                                            Cognition Arousal/Alertness: Awake/alert;Lethargic Behavior During Therapy: Flat affect Overall Cognitive Status: No family/caregiver present to determine baseline cognitive functioning                                        Exercises      General Comments        Pertinent Vitals/Pain Pain Assessment: Faces Faces Pain Scale: Hurts a little bit Pain Location: back, legs Pain Descriptors / Indicators: Sore Pain Intervention(s): Monitored during session;Limited  activity within patient's tolerance (notified nurse of pt's c/o)    Home Living                      Prior Function            PT Goals (current goals can now be found in the care plan section) Acute Rehab PT Goals Patient Stated Goal: to get home to her dog Coco PT Goal Formulation: With patient Time For Goal Achievement: 09/26/20 Potential to Achieve Goals: Fair Progress towards PT goals: Progressing toward goals    Frequency           PT Plan Current plan remains appropriate    Co-evaluation              AM-PAC PT "6 Clicks" Mobility   Outcome Measure  Help needed turning from your back to your side while in a flat bed without using bedrails?: None Help needed moving from lying on your back to sitting on the side of a flat bed without using bedrails?: None Help needed  moving to and from a bed to a chair (including a wheelchair)?: A Little Help needed standing up from a chair using your arms (e.g., wheelchair or bedside chair)?: A Little Help needed to walk in hospital room?: A Little Help needed climbing 3-5 steps with a railing? : A Lot 6 Click Score: 19    End of Session   Activity Tolerance: Patient limited by pain;Patient limited by lethargy (pt very self limiting) Patient left: in bed;with bed alarm set;with call bell/phone within reach Nurse Communication:  (pt's c/o) PT Visit Diagnosis: Unsteadiness on feet (R26.81);Other abnormalities of gait and mobility (R26.89);Muscle weakness (generalized) (M62.81);Difficulty in walking, not elsewhere classified (R26.2);Dizziness and giddiness (R42);Pain Pain - Right/Left: Left Pain - part of body: Leg     Time: 1117-1140 PT Time Calculation (min) (ACUTE ONLY): 23 min  Charges:  $Therapeutic Activity: 23-37 mins                      Aleda Grana, PT, DPT 09/15/20, 12:36 PM    Sandi Mariscal 09/15/2020, 12:31 PM

## 2020-09-15 NOTE — Plan of Care (Signed)
Continuing with plan of care. 

## 2020-09-15 NOTE — Progress Notes (Signed)
Progress Note    Marilyn Andrews  XBM:[id number] DOB: August 09, 1936  DOA: 09/09/2020 PCP: [facility]      Brief Narrative:    Medical records reviewed and are as summarized below:  Marilyn Andrews is a 84 y.o. female with medical history significant for hypertension, GERD, depression, anxiety, impaired hearing, diverticulitis, who presented to the hospital with severe abdominal pain.  Abdominal pain has been going on for almost 2 weeks but pain has progressively worsened.  She was admitted to the hospital for acute diverticulitis.  She was treated with empiric IV antibiotics, IV fluids, analgesics and antiemetics.    Assessment/Plan:   Principal Problem:   Acute diverticulitis Active Problems:   Diverticulitis   HTN (hypertension)   GERD (gastroesophageal reflux disease)   Depression   Body mass index is 35.15 kg/m.  (Obesity)   Acute diverticulitis: Continue IV meropenem.  Continue analgesics and antiemetics as needed.  Change diet from regular diet to clear liquid diet because of worsening pain.  Hypokalemia: Improved  Prolonged QTc interval (509) on admission: Repeat EKG today showed QTC 454  Hypertension: Continue antihypertensives  Depression and anxiety: Continue Escitalopram  GERD: Continue Protonix  PT recommends discharge to SNF.   Diet Order            Diet clear liquid Room service appropriate? Yes; Fluid consistency: Thin  Diet effective now                    Consultants:  None  Procedures:  None    Medications:    amLODipine  5 mg Oral Daily   enoxaparin (LOVENOX) injection  0.5 mg/kg Subcutaneous Q24H   escitalopram  20 mg Oral Daily   lactulose  20 g Oral Daily   pantoprazole  40 mg Oral Daily   Continuous Infusions:  sodium chloride 250 mL (09/15/20 0014)   lactated ringers 1,000 mL with potassium chloride 20 mEq infusion 75 mL/hr at 09/15/20 0535   meropenem (MERREM) IV 1 g (09/15/20 0856)      Anti-infectives (From admission, onward)   Start     Dose/Rate Route Frequency Ordered Stop   09/12/20 1145  meropenem (MERREM) 1 g in sodium chloride 0.9 % 100 mL IVPB        1 g 200 mL/hr over 30 Minutes Intravenous Every 12 hours 09/12/20 1047     09/12/20 1130  meropenem (MERREM) 1 g in sodium chloride 0.9 % 100 mL IVPB  Status:  Discontinued        1 g 200 mL/hr over 30 Minutes Intravenous Every 8 hours 09/12/20 1043 09/12/20 1047   09/11/20 1800  metroNIDAZOLE (FLAGYL) tablet 500 mg  Status:  Discontinued        500 mg Oral Every 8 hours 09/11/20 1133 09/12/20 1043   09/10/20 1100  cefTRIAXone (ROCEPHIN) 2 g in sodium chloride 0.9 % 100 mL IVPB  Status:  Discontinued        2 g 200 mL/hr over 30 Minutes Intravenous Every 24 hours 09/09/20 1247 09/12/20 1043   09/09/20 1800  metroNIDAZOLE (FLAGYL) IVPB 500 mg  Status:  Discontinued        500 mg 100 mL/hr over 60 Minutes Intravenous Every 8 hours 09/09/20 1247 09/11/20 1133   09/09/20 1130  cefTRIAXone (ROCEPHIN) 2 g in sodium chloride 0.9 % 100 mL IVPB        2 g 200 mL/hr over 30 Minutes Intravenous  Once  09/09/20 1120 09/09/20 1233   09/09/20 1130  metroNIDAZOLE (FLAGYL) IVPB 500 mg        500 mg 100 mL/hr over 60 Minutes Intravenous  Once 09/09/20 1120 09/09/20 1455             Family Communication/Anticipated D/C date and plan/Code Status   DVT prophylaxis: enoxaparin (LOVENOX) injection 40 mg Start: 09/09/20 2200     Code Status: DNR  Family Communication: None Disposition Plan:    Status is: Inpatient  Remains inpatient appropriate because:IV treatments appropriate due to intensity of illness or inability to take PO   Dispo: The patient is from: Home              Anticipated d/c is to: Home              Anticipated d/c date is: 2 days              Patient currently is not medically stable to d/c.           Subjective:   C/o pain in the upper abdomen and pain in the left lower  quadrant area after meals.  No vomiting or diarrhea.  Objective:    Vitals:   09/14/20 1500 09/14/20 1934 09/15/20 0001 09/15/20 0427  BP: (!) 154/66 134/60 125/78 (!) 140/56  Pulse: 60 (!) 56 63 66  Resp: 18 18 18 20   Temp: 98.1 F (36.7 C) 98 F (36.7 C) 98.3 F (36.8 C) 98 F (36.7 C)  TempSrc: Oral Oral Oral Oral  SpO2: 94% 97% 91% 94%  Weight:      Height:       No data found.   Intake/Output Summary (Last 24 hours) at 09/15/2020 1221 Last data filed at 09/15/2020 1200 Gross per 24 hour  Intake 1233.4 ml  Output 1550 ml  Net -316.6 ml   Filed Weights   09/09/20 0851  Weight: 81.6 kg    Exam:   GEN: NAD SKIN: No rash EYES: EOMI ENT: MMM CV: RRR PULM: CTA B ABD: soft, ND, mild tenderness in the mid upper abdomen and left lower quadrant. +BS CNS: AAO x 3, non focal EXT: No edema or tenderness     Data Reviewed:   I have personally reviewed following labs and imaging studies:  Labs: Labs show the following:   Basic Metabolic Panel: Recent Labs  Lab 09/09/20 0854 09/09/20 0854 09/10/20 0413 09/10/20 0413 09/11/20 0958 09/12/20 0342 09/13/20 0426  NA 137  --  139  --   --  142 139  K 3.5   < > 3.2*   < >  --  4.1 3.6  CL 103  --  105  --   --  110 108  CO2 25  --  25  --   --  26 25  GLUCOSE 123*  --  103*  --   --  99 87  BUN 9  --  7*  --   --  10 7*  CREATININE 0.92  --  0.87  --   --  0.86 0.83  CALCIUM 8.6*  --  8.0*  --   --  7.7* 8.0*  MG  --   --   --   --  2.4  --   --    < > = values in this interval not displayed.   GFR Estimated Creatinine Clearance: 49.4 mL/min (by C-G formula based on SCr of 0.83 mg/dL). Liver Function Tests: Recent Labs  Lab 09/09/20 0854  AST 18  ALT 12  ALKPHOS 83  BILITOT 1.0  PROT 7.0  ALBUMIN 3.7   Recent Labs  Lab 09/09/20 0854  LIPASE 25   No results for input(s): AMMONIA in the last 168 hours. Coagulation profile No results for input(s): INR, PROTIME in the last 168  hours.  CBC: Recent Labs  Lab 09/09/20 0854 09/10/20 0413 09/12/20 0342 09/13/20 0426  WBC 12.1* 10.7* 8.9 6.5  NEUTROABS  --   --  6.6 3.7  HGB 15.2* 14.5 13.4 13.0  HCT 45.0 42.9 40.9 39.5  MCV 89.3 90.5 92.1 91.4  PLT 338 314 307 359   Cardiac Enzymes: No results for input(s): CKTOTAL, CKMB, CKMBINDEX, TROPONINI in the last 168 hours. BNP (last 3 results) No results for input(s): PROBNP in the last 8760 hours. CBG: No results for input(s): GLUCAP in the last 168 hours. D-Dimer: No results for input(s): DDIMER in the last 72 hours. Hgb A1c: No results for input(s): HGBA1C in the last 72 hours. Lipid Profile: No results for input(s): CHOL, HDL, LDLCALC, TRIG, CHOLHDL, LDLDIRECT in the last 72 hours. Thyroid function studies: No results for input(s): TSH, T4TOTAL, T3FREE, THYROIDAB in the last 72 hours.  Invalid input(s): FREET3 Anemia work up: No results for input(s): VITAMINB12, FOLATE, FERRITIN, TIBC, IRON, RETICCTPCT in the last 72 hours. Sepsis Labs: Recent Labs  Lab 09/09/20 0854 09/09/20 1029 09/09/20 1711 09/10/20 0413 09/12/20 0342 09/13/20 0426  WBC 12.1*  --   --  10.7* 8.9 6.5  LATICACIDVEN  --  1.3 1.1  --   --   --     Microbiology Recent Results (from the past 240 hour(s))  Blood culture (single)     Status: None   Collection Time: 09/09/20 10:29 AM   Specimen: BLOOD  Result Value Ref Range Status   Specimen Description BLOOD LEFT ANTECUBITAL  Final   Special Requests   Final    BOTTLES DRAWN AEROBIC AND ANAEROBIC Blood Culture results may not be optimal due to an inadequate volume of blood received in culture bottles   Culture   Final    NO GROWTH 5 DAYS Performed at [facility], [address], [place], [place] [address]    Report Status 09/14/2020 FINAL  Final  Respiratory Panel by RT PCR (Flu A&B, Covid) - Nasopharyngeal Swab     Status: None   Collection Time: 09/09/20 12:34 PM   Specimen: Nasopharyngeal Swab  Result Value  Ref Range Status   SARS Coronavirus 2 by RT PCR NEGATIVE NEGATIVE Final    Comment: (NOTE) SARS-CoV-2 target nucleic acids are NOT DETECTED.  The SARS-CoV-2 RNA is generally detectable in upper respiratoy specimens during the acute phase of infection. The lowest concentration of SARS-CoV-2 viral copies this assay can detect is 131 copies/mL. A negative result does not preclude SARS-Cov-2 infection and should not be used as the sole basis for treatment or other patient management decisions. A negative result may occur with  improper specimen collection/handling, submission of specimen other than nasopharyngeal swab, presence of viral mutation(s) within the areas targeted by this assay, and inadequate number of viral copies (<131 copies/mL). A negative result must be combined with clinical observations, patient history, and epidemiological information. The expected result is Negative.  Fact Sheet for Patients:  https://www.moore.com/  Fact Sheet for Healthcare Providers:  https://www.young.biz/  This test is no t yet approved or cleared by the [place] FDA and  has been authorized for detection and/or  diagnosis of SARS-CoV-2 by FDA under an Emergency Use Authorization (EUA). This EUA will remain  in effect (meaning this test can be used) for the duration of the COVID-19 declaration under Section 564(b)(1) of the Act, 21 U.S.C. section 360bbb-3(b)(1), unless the authorization is terminated or revoked sooner.     Influenza A by PCR NEGATIVE NEGATIVE Final   Influenza B by PCR NEGATIVE NEGATIVE Final    Comment: (NOTE) The Xpert Xpress SARS-CoV-2/FLU/RSV assay is intended as an aid in  the diagnosis of influenza from Nasopharyngeal swab specimens and  should not be used as a sole basis for treatment. Nasal washings and  aspirates are unacceptable for Xpert Xpress SARS-CoV-2/FLU/RSV  testing.  Fact Sheet for  Patients: https://www.moore.com/  Fact Sheet for Healthcare Providers: https://www.young.biz/  This test is not yet approved or cleared by the [place] FDA and  has been authorized for detection and/or diagnosis of SARS-CoV-2 by  FDA under an Emergency Use Authorization (EUA). This EUA will remain  in effect (meaning this test can be used) for the duration of the  Covid-19 declaration under Section 564(b)(1) of the Act, 21  U.S.C. section 360bbb-3(b)(1), unless the authorization is  terminated or revoked. Performed at [facility], [address] [place], [place] [address]   Blood culture (single)     Status: None   Collection Time: 09/09/20  5:11 PM   Specimen: BLOOD  Result Value Ref Range Status   Specimen Description BLOOD RIGHT ANTECUBITAL  Final   Special Requests   Final    BOTTLES DRAWN AEROBIC AND ANAEROBIC Blood Culture adequate volume   Culture   Final    NO GROWTH 5 DAYS Performed at [facility], [address], [place], [place] [address]    Report Status 09/14/2020 FINAL  Final    Procedures and diagnostic studies:  No results found.             LOS: 5 days   Taylan Marez  [facility]   Pager on [email or web address]. If 7PM-7AM, please contact night-coverage at [email or web address]     09/15/2020, 12:21 PM

## 2020-09-16 DIAGNOSIS — K5792 Diverticulitis of intestine, part unspecified, without perforation or abscess without bleeding: Secondary | ICD-10-CM | POA: Diagnosis not present

## 2020-09-16 MED ORDER — METRONIDAZOLE 500 MG PO TABS
500.0000 mg | ORAL_TABLET | Freq: Three times a day (TID) | ORAL | Status: AC
  Administered 2020-09-16 – 2020-09-19 (×8): 500 mg via ORAL
  Filled 2020-09-16 (×9): qty 1

## 2020-09-16 MED ORDER — CIPROFLOXACIN HCL 500 MG PO TABS
500.0000 mg | ORAL_TABLET | Freq: Two times a day (BID) | ORAL | Status: AC
  Administered 2020-09-16 – 2020-09-18 (×6): 500 mg via ORAL
  Filled 2020-09-16 (×6): qty 1

## 2020-09-16 MED ORDER — HYDROCORTISONE 0.5 % EX CREA
TOPICAL_CREAM | Freq: Two times a day (BID) | CUTANEOUS | Status: DC
  Administered 2020-09-20: 1 via TOPICAL
  Filled 2020-09-16 (×2): qty 28.35

## 2020-09-16 NOTE — Care Management Important Message (Signed)
Important Message  Patient Details  Name: Marilyn Andrews MRN: [id number] Date of Birth: February 22, 1936   Medicare Important Message Given:  Yes     [name] 09/16/2020, 11:18 AM

## 2020-09-16 NOTE — Progress Notes (Signed)
OT Cancellation Note  Patient Details Name: Marilyn Andrews MRN: [id number] DOB: [birth year]   Cancelled Treatment:    Reason Eval/Treat Not Completed: Patient declined, no reason specified;Pain limiting ability to participate. Upon attempt, pt in bed, declines therapy 2/2 pain/discomfort despite also reporting that her pain "wasn't too bad" after recently receiving pain medication. When asked whether therapist could re-attempt this afternoon, pt states "you better wait until tomorrow, they got to bring me that cream medicine and give it time to work." Pt reports cream for the irritation in her perianal/vaginal area 2/2 prolonged purewick use. Pt declined repositioning to support improved comfort. Will re-attempt OT tx and review goals of care next date as pt is able to participate and medically appropriate.   Richrd Prime, MPH, MS, OTR/L ascom [phone] 09/16/20, 11:37 AM

## 2020-09-16 NOTE — TOC Initial Note (Signed)
Transition of Care [facility]) - Initial/Assessment Note    Patient Details  Name: Marilyn Andrews MRN: [id number] Date of Birth: [birth year]  Transition [facility] Care [facility] CM/SW Contact:    Beverly Sessions, RN Phone Number: 09/16/2020, 2:48 PM  Clinical Narrative:                  Notified by MD that patient is agreeable to SNF placement.    TOC met with patient at bedside to complete assessment. Patient states she is unable to discuss at this time.    Notified MD via secure chat "Just left patient's room. Attempted to discuss SNF. She said that she can't talk to me right now. she states "I don't feel right, I think I have had to much medicine". I notified the nurse and she is on the way to the room. I will follow up with her in the morning. I do have some bed offers on her if she is in agreement"       Patient Goals and CMS Choice        Expected Discharge Plan and Services                                                Prior Living Arrangements/Services                       Activities of Daily Living Home Assistive Devices/Equipment: None ADL Screening (condition at time of admission) Patient's cognitive ability adequate to safely complete daily activities?: Yes Is the patient deaf or have difficulty hearing?: Yes Does the patient have difficulty seeing, even when wearing glasses/contacts?: No Does the patient have difficulty concentrating, remembering, or making decisions?: No Patient able to express need for assistance with ADLs?: Yes Does the patient have difficulty dressing or bathing?: Yes Independently performs ADLs?: No Communication: Independent Dressing (OT): Independent Is this a change from baseline?: Pre-admission baseline Grooming: Needs assistance Is this a change from baseline?: Pre-admission baseline Feeding: Independent Bathing: Needs assistance Is this a change from baseline?: Pre-admission baseline Toileting: Independent In/Out Bed:  Independent Walks in Home: Independent Does the patient have difficulty walking or climbing stairs?: Yes Weakness of Legs: Both Weakness of Arms/Hands: None  Permission Sought/Granted                  Emotional Assessment              Admission diagnosis:  Acute diverticulitis [K57.92] Diverticulitis [K57.92] Patient Active Problem List   Diagnosis Date Noted   GERD (gastroesophageal reflux disease) 09/09/2020   Depression 09/09/2020   HTN (hypertension) 03/05/2020   UTI (urinary tract infection) 07/28/2018   Severe recurrent major depression without psychotic features ([place]) 03/08/2018   Acute diverticulitis 03/07/2018   Diverticulitis 07/01/2016   PCP:  [facility] [place]:   [place] [place] [address] Phone: [phone] Fax: [phone]     Social Determinants of Health (SDOH) Interventions    Readmission Risk Interventions No flowsheet data found.

## 2020-09-16 NOTE — Progress Notes (Signed)
Progress Note    Marilyn Andrews  ION:[id number] DOB: 1936-09-27  DOA: 09/09/2020 PCP: [facility]      Brief Narrative:    Medical records reviewed and are as summarized below:  Marilyn Andrews is a 84 y.o. female with medical history significant for hypertension, GERD, depression, anxiety, impaired hearing, diverticulitis, who presented to the hospital with severe abdominal pain.  Abdominal pain has been going on for almost 2 weeks but pain has progressively worsened.  She was admitted to the hospital for acute diverticulitis.  She was treated with empiric IV antibiotics, IV fluids, analgesics and antiemetics.    Assessment/Plan:   Principal Problem:   Acute diverticulitis Active Problems:   Diverticulitis   HTN (hypertension)   GERD (gastroesophageal reflux disease)   Depression   Body mass index is 35.15 kg/m.  (Obesity)   Acute diverticulitis: Discontinue IV meropenem and start oral ciprofloxacin and Flagyl.  Continue analgesics and antiemetics as needed.  Advance diet from clear liquid diet to heart healthy diet.  Discontinue IV fluids.  Hypokalemia: Improved.  Repeat BMP tomorrow  Prolonged QTc interval (509) on admission: Repeat EKG tomorrow since she has been started on Cipro  Hypertension: Continue antihypertensives  Depression and anxiety: Continue Escitalopram  GERD: Continue Protonix  PT recommends discharge to SNF.  Possible discharge to SNF in 1 to 2 days   Diet Order            Diet Heart Room service appropriate? Yes; Fluid consistency: Thin  Diet effective now                    Consultants:  None  Procedures:  None    Medications:   . amLODipine  5 mg Oral Daily  . ciprofloxacin  500 mg Oral BID  . enoxaparin (LOVENOX) injection  0.5 mg/kg Subcutaneous Q24H  . escitalopram  20 mg Oral Daily  . hydrocortisone cream   Topical BID  . lactulose  20 g Oral Daily  . metroNIDAZOLE  500 mg Oral Q8H  .  pantoprazole  40 mg Oral Daily   Continuous Infusions: . sodium chloride Stopped (09/15/20 2106)  . lactated ringers 1,000 mL with potassium chloride 20 mEq infusion 75 mL/hr at 09/16/20 1203     Anti-infectives (From admission, onward)   Start     Dose/Rate Route Frequency Ordered Stop   09/16/20 1000  ciprofloxacin (CIPRO) tablet 500 mg        500 mg Oral 2 times daily 09/16/20 0757 09/19/20 0759   09/16/20 1000  metroNIDAZOLE (FLAGYL) tablet 500 mg        500 mg Oral Every 8 hours 09/16/20 0757 09/19/20 1359   09/12/20 1145  meropenem (MERREM) 1 g in sodium chloride 0.9 % 100 mL IVPB  Status:  Discontinued        1 g 200 mL/hr over 30 Minutes Intravenous Every 12 hours 09/12/20 1047 09/16/20 0757   09/12/20 1130  meropenem (MERREM) 1 g in sodium chloride 0.9 % 100 mL IVPB  Status:  Discontinued        1 g 200 mL/hr over 30 Minutes Intravenous Every 8 hours 09/12/20 1043 09/12/20 1047   09/11/20 1800  metroNIDAZOLE (FLAGYL) tablet 500 mg  Status:  Discontinued        500 mg Oral Every 8 hours 09/11/20 1133 09/12/20 1043   09/10/20 1100  cefTRIAXone (ROCEPHIN) 2 g in sodium chloride 0.9 % 100 mL IVPB  Status:  Discontinued        2 g 200 mL/hr over 30 Minutes Intravenous Every 24 hours 09/09/20 1247 09/12/20 1043   09/09/20 1800  metroNIDAZOLE (FLAGYL) IVPB 500 mg  Status:  Discontinued        500 mg 100 mL/hr over 60 Minutes Intravenous Every 8 hours 09/09/20 1247 09/11/20 1133   09/09/20 1130  cefTRIAXone (ROCEPHIN) 2 g in sodium chloride 0.9 % 100 mL IVPB        2 g 200 mL/hr over 30 Minutes Intravenous  Once 09/09/20 1120 09/09/20 1233   09/09/20 1130  metroNIDAZOLE (FLAGYL) IVPB 500 mg        500 mg 100 mL/hr over 60 Minutes Intravenous  Once 09/09/20 1120 09/09/20 1455             Family Communication/Anticipated D/C date and plan/Code Status   DVT prophylaxis: enoxaparin (LOVENOX) injection 40 mg Start: 09/09/20 2200     Code Status: DNR  Family  Communication: None Disposition Plan:    Status is: Inpatient  Remains inpatient appropriate because:IV treatments appropriate due to intensity of illness or inability to take PO   Dispo: The patient is from: Home              Anticipated d/c is to: Home              Anticipated d/c date is: 2 days              Patient currently is not medically stable to d/c.           Subjective:   Abdominal pain is better but he still has some pain in the left lower quadrant area.  She tolerated clear liquid diet and she wants to advance her diet to regular diet.  Objective:    Vitals:   09/16/20 0443 09/16/20 0800 09/16/20 1100 09/16/20 1543  BP: (!) 141/59 (!) 148/57 (!) 151/62 (!) 148/60  Pulse: 61 (!) 58 66 64  Resp: 20 20 20 18   Temp: 98.1 F (36.7 C) 98.7 F (37.1 C) 98.1 F (36.7 C) 98.3 F (36.8 C)  TempSrc:  Oral Oral Oral  SpO2: 93% 93% 91% 92%  Weight:      Height:       No data found.   Intake/Output Summary (Last 24 hours) at 09/16/2020 1649 Last data filed at 09/16/2020 1544 Gross per 24 hour  Intake 1289.1 ml  Output 3350 ml  Net -2060.9 ml   Filed Weights   09/09/20 0851  Weight: 81.6 kg    Exam:  GEN: NAD SKIN: Erythematous lesions in the perineal region EYES: No pallor or icterus ENT: MMM CV: RRR PULM: CTA B ABD: soft, ND, mild tenderness in the left lower quadrant, +BS CNS: AAO x 3, non focal EXT: No edema or tenderness     Data Reviewed:   I have personally reviewed following labs and imaging studies:  Labs: Labs show the following:   Basic Metabolic Panel: Recent Labs  Lab 09/10/20 0413 09/10/20 0413 09/11/20 0958 09/12/20 0342 09/13/20 0426  NA 139  --   --  142 139  K 3.2*   < >  --  4.1 3.6  CL 105  --   --  110 108  CO2 25  --   --  26 25  GLUCOSE 103*  --   --  99 87  BUN 7*  --   --  10 7*  CREATININE 0.87  --   --  0.86 0.83  CALCIUM 8.0*  --   --  7.7* 8.0*  MG  --   --  2.4  --   --    < > = values in  this interval not displayed.   GFR Estimated Creatinine Clearance: 49.4 mL/min (by C-G formula based on SCr of 0.83 mg/dL). Liver Function Tests: No results for input(s): AST, ALT, ALKPHOS, BILITOT, PROT, ALBUMIN in the last 168 hours. No results for input(s): LIPASE, AMYLASE in the last 168 hours. No results for input(s): AMMONIA in the last 168 hours. Coagulation profile No results for input(s): INR, PROTIME in the last 168 hours.  CBC: Recent Labs  Lab 09/10/20 0413 09/12/20 0342 09/13/20 0426  WBC 10.7* 8.9 6.5  NEUTROABS  --  6.6 3.7  HGB 14.5 13.4 13.0  HCT 42.9 40.9 39.5  MCV 90.5 92.1 91.4  PLT 314 307 359   Cardiac Enzymes: No results for input(s): CKTOTAL, CKMB, CKMBINDEX, TROPONINI in the last 168 hours. BNP (last 3 results) No results for input(s): PROBNP in the last 8760 hours. CBG: No results for input(s): GLUCAP in the last 168 hours. D-Dimer: No results for input(s): DDIMER in the last 72 hours. Hgb A1c: No results for input(s): HGBA1C in the last 72 hours. Lipid Profile: No results for input(s): CHOL, HDL, LDLCALC, TRIG, CHOLHDL, LDLDIRECT in the last 72 hours. Thyroid function studies: No results for input(s): TSH, T4TOTAL, T3FREE, THYROIDAB in the last 72 hours.  Invalid input(s): FREET3 Anemia work up: No results for input(s): VITAMINB12, FOLATE, FERRITIN, TIBC, IRON, RETICCTPCT in the last 72 hours. Sepsis Labs: Recent Labs  Lab 09/09/20 1711 09/10/20 0413 09/12/20 0342 09/13/20 0426  WBC  --  10.7* 8.9 6.5  LATICACIDVEN 1.1  --   --   --     Microbiology Recent Results (from the past 240 hour(s))  Blood culture (single)     Status: None   Collection Time: 09/09/20 10:29 AM   Specimen: BLOOD  Result Value Ref Range Status   Specimen Description BLOOD LEFT ANTECUBITAL  Final   Special Requests   Final    BOTTLES DRAWN AEROBIC AND ANAEROBIC Blood Culture results may not be optimal due to an inadequate volume of blood received in culture  bottles   Culture   Final    NO GROWTH 5 DAYS Performed at [facility], [address] [place], [place] [address]    Report Status 09/14/2020 FINAL  Final  Respiratory Panel by RT PCR (Flu A&B, Covid) - Nasopharyngeal Swab     Status: None   Collection Time: 09/09/20 12:34 PM   Specimen: Nasopharyngeal Swab  Result Value Ref Range Status   SARS Coronavirus 2 by RT PCR NEGATIVE NEGATIVE Final    Comment: (NOTE) SARS-CoV-2 target nucleic acids are NOT DETECTED.  The SARS-CoV-2 RNA is generally detectable in upper respiratoy specimens during the acute phase of infection. The lowest concentration of SARS-CoV-2 viral copies this assay can detect is 131 copies/mL. A negative result does not preclude SARS-Cov-2 infection and should not be used as the sole basis for treatment or other patient management decisions. A negative result may occur with  improper specimen collection/handling, submission of specimen other than nasopharyngeal swab, presence of viral mutation(s) within the areas targeted by this assay, and inadequate number of viral copies (<131 copies/mL). A negative result must be combined with clinical observations, patient history, and epidemiological information. The expected result is Negative.  Fact Sheet for Patients:  https://www.moore.com/https://www.fda.gov/media/142436/download  Fact  Sheet for Healthcare Providers:  https://www.young.biz/  This test is no t yet approved or cleared by the [place] FDA and  has been authorized for detection and/or diagnosis of SARS-CoV-2 by FDA under an Emergency Use Authorization (EUA). This EUA will remain  in effect (meaning this test can be used) for the duration of the COVID-19 declaration under Section 564(b)(1) of the Act, 21 U.S.C. section 360bbb-3(b)(1), unless the authorization is terminated or revoked sooner.     Influenza A by PCR NEGATIVE NEGATIVE Final   Influenza B by PCR NEGATIVE NEGATIVE Final     Comment: (NOTE) The Xpert Xpress SARS-CoV-2/FLU/RSV assay is intended as an aid in  the diagnosis of influenza from Nasopharyngeal swab specimens and  should not be used as a sole basis for treatment. Nasal washings and  aspirates are unacceptable for Xpert Xpress SARS-CoV-2/FLU/RSV  testing.  Fact Sheet for Patients: https://www.moore.com/  Fact Sheet for Healthcare Providers: https://www.young.biz/  This test is not yet approved or cleared by the [place] FDA and  has been authorized for detection and/or diagnosis of SARS-CoV-2 by  FDA under an Emergency Use Authorization (EUA). This EUA will remain  in effect (meaning this test can be used) for the duration of the  Covid-19 declaration under Section 564(b)(1) of the Act, 21  U.S.C. section 360bbb-3(b)(1), unless the authorization is  terminated or revoked. Performed at [facility], [address] [place], [place] [address]   Blood culture (single)     Status: None   Collection Time: 09/09/20  5:11 PM   Specimen: BLOOD  Result Value Ref Range Status   Specimen Description BLOOD RIGHT ANTECUBITAL  Final   Special Requests   Final    BOTTLES DRAWN AEROBIC AND ANAEROBIC Blood Culture adequate volume   Culture   Final    NO GROWTH 5 DAYS Performed at [facility], [address] [place], [place] [address]    Report Status 09/14/2020 FINAL  Final    Procedures and diagnostic studies:  No results found.             LOS: 6 days   Ramey Ketcherside  [facility]   Pager on [email or web address]. If 7PM-7AM, please contact night-coverage at [email or web address]     09/16/2020, 4:49 PM

## 2020-09-16 NOTE — Plan of Care (Signed)
  Problem: Education: Goal: Knowledge of General Education information will improve Description: Including pain rating scale, medication(s)/side effects and non-pharmacologic comfort measures 09/16/2020 1036 by Ansel Bong, RN Outcome: Progressing 09/16/2020 1036 by Ansel Bong, RN Outcome: Progressing   Problem: Health Behavior/Discharge Planning: Goal: Ability to manage health-related needs will improve 09/16/2020 1036 by Ansel Bong, RN Outcome: Progressing 09/16/2020 1036 by Ansel Bong, RN Outcome: Progressing   Problem: Clinical Measurements: Goal: Ability to maintain clinical measurements within normal limits will improve 09/16/2020 1036 by Ansel Bong, RN Outcome: Progressing 09/16/2020 1036 by Ansel Bong, RN Outcome: Progressing Goal: Will remain free from infection 09/16/2020 1036 by Ansel Bong, RN Outcome: Progressing 09/16/2020 1036 by Ansel Bong, RN Outcome: Progressing Goal: Diagnostic test results will improve 09/16/2020 1036 by Ansel Bong, RN Outcome: Progressing 09/16/2020 1036 by Ansel Bong, RN Outcome: Progressing Goal: Respiratory complications will improve 09/16/2020 1036 by Ansel Bong, RN Outcome: Progressing 09/16/2020 1036 by Ansel Bong, RN Outcome: Progressing Goal: Cardiovascular complication will be avoided 09/16/2020 1036 by Ansel Bong, RN Outcome: Progressing 09/16/2020 1036 by Ansel Bong, RN Outcome: Progressing   Problem: Activity: Goal: Risk for activity intolerance will decrease 09/16/2020 1036 by Ansel Bong, RN Outcome: Progressing 09/16/2020 1036 by Ansel Bong, RN Outcome: Progressing   Problem: Nutrition: Goal: Adequate nutrition will be maintained 09/16/2020 1036 by Ansel Bong, RN Outcome: Progressing 09/16/2020 1036 by Ansel Bong, RN Outcome: Progressing   Problem: Coping: Goal: Level of anxiety will decrease 09/16/2020 1036 by Ansel Bong, RN Outcome:  Progressing 09/16/2020 1036 by Ansel Bong, RN Outcome: Progressing   Problem: Elimination: Goal: Will not experience complications related to bowel motility 09/16/2020 1036 by Ansel Bong, RN Outcome: Progressing 09/16/2020 1036 by Ansel Bong, RN Outcome: Progressing Goal: Will not experience complications related to urinary retention 09/16/2020 1036 by Ansel Bong, RN Outcome: Progressing 09/16/2020 1036 by Ansel Bong, RN Outcome: Progressing   Problem: Pain Managment: Goal: General experience of comfort will improve 09/16/2020 1036 by Ansel Bong, RN Outcome: Progressing 09/16/2020 1036 by Ansel Bong, RN Outcome: Progressing   Problem: Safety: Goal: Ability to remain free from injury will improve 09/16/2020 1036 by Ansel Bong, RN Outcome: Progressing 09/16/2020 1036 by Ansel Bong, RN Outcome: Progressing   Problem: Skin Integrity: Goal: Risk for impaired skin integrity will decrease 09/16/2020 1036 by Ansel Bong, RN Outcome: Progressing 09/16/2020 1036 by Ansel Bong, RN Outcome: Progressing

## 2020-09-16 NOTE — Progress Notes (Signed)
PT Cancellation Note  Patient Details Name: Marilyn Andrews MRN: [id number] DOB: [birth year]   Cancelled Treatment:    Reason Eval/Treat Not Completed: Patient declined to participate with PT services this date secondary to requiring hygiene assistance.  Discussed with pt option of working with PT later this date after she was clean with pt declining, stating "I just can't think about that today".  Educated pt on physiological benefits of activity with pt continuing to decline to participate with PT services.  Will attempt to see pt at a future date/time as medically appropriate.     Ovidio Hanger PT, DPT 09/16/20, 4:53 PM

## 2020-09-17 DIAGNOSIS — K5792 Diverticulitis of intestine, part unspecified, without perforation or abscess without bleeding: Secondary | ICD-10-CM | POA: Diagnosis not present

## 2020-09-17 DIAGNOSIS — R531 Weakness: Secondary | ICD-10-CM | POA: Diagnosis not present

## 2020-09-17 LAB — BASIC METABOLIC PANEL
Anion gap: 9 (ref 5–15)
BUN: 8 mg/dL (ref 8–23)
CO2: 27 mmol/L (ref 22–32)
Calcium: 8.5 mg/dL — ABNORMAL LOW (ref 8.9–10.3)
Chloride: 103 mmol/L (ref 98–111)
Creatinine, Ser: 0.86 mg/dL (ref 0.44–1.00)
GFR, Estimated: >60 mL/min (ref >60)
Glucose, Bld: 99 mg/dL (ref 70–99)
Potassium: 3.9 mmol/L (ref 3.5–5.1)
Sodium: 139 mmol/L (ref 135–145)

## 2020-09-17 MED ORDER — HYDRALAZINE HCL 20 MG/ML IJ SOLN
5.0000 mg | Freq: Once | INTRAMUSCULAR | Status: AC
  Administered 2020-09-17: 5 mg via INTRAVENOUS
  Filled 2020-09-17: qty 1

## 2020-09-17 NOTE — Progress Notes (Addendum)
Progress Note    Marilyn Andrews  LEX:[id number] DOB: [birth year]  DOA: 09/09/2020 PCP: [facility]      Brief Narrative:    Medical records reviewed and are as summarized below:  Marilyn Andrews is a 84 y.o. female with medical history significant for hypertension, GERD, depression, anxiety, impaired hearing, diverticulitis, who presented to the hospital with severe abdominal pain.  Abdominal pain has been going on for almost 2 weeks but pain has progressively worsened.  She was admitted to the hospital for acute diverticulitis.  She was treated with empiric IV antibiotics, IV fluids, analgesics and antiemetics.  Her condition has slowly improved.  She was evaluated by PT and OT recommended further rehabilitation in the skilled nursing facility.  However, patient refused to go to SNF.  She prefers to go home with home health therapy.    Assessment/Plan:   Principal Problem:   Acute diverticulitis Active Problems:   Diverticulitis   HTN (hypertension)   GERD (gastroesophageal reflux disease)   Depression   Generalized weakness   Body mass index is 35.15 kg/m.  (Obesity)   Acute diverticulitis: Continue oral ciprofloxacin and Flagyl.  Plan to stop antibiotics on 09/18/2020.  Continue analgesia and antiemetics as needed.  Change heart healthy diet to clear liquid diet for now.  Plan to advance diet tomorrow.  Maintaining patient on a regular diet consistently has been challenging because of intermittent nausea, abdominal pain and vomiting.  She does not like the clear liquid diet and normally prefers to be on a regular diet although this has been difficult to tolerate at times.  Plan to discharge home tomorrow if she tolerates a regular diet  Hypokalemia: Improved.  Prolonged QTc interval (509) on admission: Improved to 454.  Today's EKG is pending.   Hypertension: Continue antihypertensives  Depression and anxiety: Continue Escitalopram  GERD: Continue  Protonix  PT and OT recommend discharge to SNF.  However, patient refuses to go to SNF.  She plans to live with her daughter-in-law after discharge.   Diet Order            Diet clear liquid Room service appropriate? Yes; Fluid consistency: Thin  Diet effective now                    Consultants:  None  Procedures:  None    Medications:   . amLODipine  5 mg Oral Daily  . ciprofloxacin  500 mg Oral BID  . enoxaparin (LOVENOX) injection  0.5 mg/kg Subcutaneous Q24H  . escitalopram  20 mg Oral Daily  . hydrocortisone cream   Topical BID  . lactulose  20 g Oral Daily  . metroNIDAZOLE  500 mg Oral Q8H  . pantoprazole  40 mg Oral Daily   Continuous Infusions: . sodium chloride Stopped (09/15/20 2106)     Anti-infectives (From admission, onward)   Start     Dose/Rate Route Frequency Ordered Stop   09/16/20 1000  ciprofloxacin (CIPRO) tablet 500 mg        500 mg Oral 2 times daily 09/16/20 0757 09/19/20 0759   09/16/20 1000  metroNIDAZOLE (FLAGYL) tablet 500 mg        500 mg Oral Every 8 hours 09/16/20 0757 09/19/20 1359   09/12/20 1145  meropenem (MERREM) 1 g in sodium chloride 0.9 % 100 mL IVPB  Status:  Discontinued        1 g 200 mL/hr over 30 Minutes Intravenous Every 12  hours 09/12/20 1047 09/16/20 0757   09/12/20 1130  meropenem (MERREM) 1 g in sodium chloride 0.9 % 100 mL IVPB  Status:  Discontinued        1 g 200 mL/hr over 30 Minutes Intravenous Every 8 hours 09/12/20 1043 09/12/20 1047   09/11/20 1800  metroNIDAZOLE (FLAGYL) tablet 500 mg  Status:  Discontinued        500 mg Oral Every 8 hours 09/11/20 1133 09/12/20 1043   09/10/20 1100  cefTRIAXone (ROCEPHIN) 2 g in sodium chloride 0.9 % 100 mL IVPB  Status:  Discontinued        2 g 200 mL/hr over 30 Minutes Intravenous Every 24 hours 09/09/20 1247 09/12/20 1043   09/09/20 1800  metroNIDAZOLE (FLAGYL) IVPB 500 mg  Status:  Discontinued        500 mg 100 mL/hr over 60 Minutes Intravenous Every 8 hours  09/09/20 1247 09/11/20 1133   09/09/20 1130  cefTRIAXone (ROCEPHIN) 2 g in sodium chloride 0.9 % 100 mL IVPB        2 g 200 mL/hr over 30 Minutes Intravenous  Once 09/09/20 1120 09/09/20 1233   09/09/20 1130  metroNIDAZOLE (FLAGYL) IVPB 500 mg        500 mg 100 mL/hr over 60 Minutes Intravenous  Once 09/09/20 1120 09/09/20 1455             Family Communication/Anticipated D/C date and plan/Code Status   DVT prophylaxis: enoxaparin (LOVENOX) injection 40 mg Start: 09/09/20 2200     Code Status: DNR  Family Communication: None Disposition Plan:    Status is: Inpatient  Remains inpatient appropriate because:IV treatments appropriate due to intensity of illness or inability to take PO   Dispo: The patient is from: Home              Anticipated d/c is to: Home              Anticipated d/c date is: 1 day              Patient currently is not medically stable to d/c.           Subjective:   C/o nausea, vomiting and abdominal pain.  Overall, abdominal pain is better compared to when she first came in.  She vomited once today.  Objective:    Vitals:   09/17/20 0611 09/17/20 0749 09/17/20 1122 09/17/20 1301  BP: (!) 182/74 (!) 150/62 (!) 154/72 (!) 161/76  Pulse: 75 70 75 95  Resp: 20 19 (!) 30 (!) 30  Temp: 98.4 F (36.9 C) 98.4 F (36.9 C) 98.6 F (37 C) 98.1 F (36.7 C)  TempSrc: Oral Oral Oral Oral  SpO2: 94% 94% 94% 94%  Weight:      Height:       No data found.   Intake/Output Summary (Last 24 hours) at 09/17/2020 1453 Last data filed at 09/17/2020 1300 Gross per 24 hour  Intake 0 ml  Output 700 ml  Net -700 ml   Filed Weights   09/09/20 0851  Weight: 81.6 kg    Exam:  GEN: NAD SKIN: Erythematous lesions from moisture on the perineal region EYES: EOMI ENT: MMM CV: RRR PULM: CTA B ABD: soft, obese, mild tenderness in the left lower quadrant, +BS CNS: AAO x 3, non focal EXT: No edema or tenderness    GEN: NAD SKIN:  Erythematous lesions in the perineal region EYES: No pallor or icterus ENT: MMM CV: RRR PULM: CTA  B ABD: soft, ND, mild tenderness in the left lower quadrant, +BS CNS: AAO x 3, non focal EXT: No edema or tenderness     Data Reviewed:   I have personally reviewed following labs and imaging studies:  Labs: Labs show the following:   Basic Metabolic Panel: Recent Labs  Lab 09/11/20 0958 09/12/20 0342 09/12/20 0342 09/13/20 0426 09/17/20 0645  NA  --  142  --  139 139  K  --  4.1   < > 3.6 3.9  CL  --  110  --  108 103  CO2  --  26  --  25 27  GLUCOSE  --  99  --  87 99  BUN  --  10  --  7* 8  CREATININE  --  0.86  --  0.83 0.86  CALCIUM  --  7.7*  --  8.0* 8.5*  MG 2.4  --   --   --   --    < > = values in this interval not displayed.   GFR Estimated Creatinine Clearance: 47.7 mL/min (by C-G formula based on SCr of 0.86 mg/dL). Liver Function Tests: No results for input(s): AST, ALT, ALKPHOS, BILITOT, PROT, ALBUMIN in the last 168 hours. No results for input(s): LIPASE, AMYLASE in the last 168 hours. No results for input(s): AMMONIA in the last 168 hours. Coagulation profile No results for input(s): INR, PROTIME in the last 168 hours.  CBC: Recent Labs  Lab 09/12/20 0342 09/13/20 0426  WBC 8.9 6.5  NEUTROABS 6.6 3.7  HGB 13.4 13.0  HCT 40.9 39.5  MCV 92.1 91.4  PLT 307 359   Cardiac Enzymes: No results for input(s): CKTOTAL, CKMB, CKMBINDEX, TROPONINI in the last 168 hours. BNP (last 3 results) No results for input(s): PROBNP in the last 8760 hours. CBG: No results for input(s): GLUCAP in the last 168 hours. D-Dimer: No results for input(s): DDIMER in the last 72 hours. Hgb A1c: No results for input(s): HGBA1C in the last 72 hours. Lipid Profile: No results for input(s): CHOL, HDL, LDLCALC, TRIG, CHOLHDL, LDLDIRECT in the last 72 hours. Thyroid function studies: No results for input(s): TSH, T4TOTAL, T3FREE, THYROIDAB in the last 72  hours.  Invalid input(s): FREET3 Anemia work up: No results for input(s): VITAMINB12, FOLATE, FERRITIN, TIBC, IRON, RETICCTPCT in the last 72 hours. Sepsis Labs: Recent Labs  Lab 09/12/20 0342 09/13/20 0426  WBC 8.9 6.5    Microbiology Recent Results (from the past 240 hour(s))  Blood culture (single)     Status: None   Collection Time: 09/09/20 10:29 AM   Specimen: BLOOD  Result Value Ref Range Status   Specimen Description BLOOD LEFT ANTECUBITAL  Final   Special Requests   Final    BOTTLES DRAWN AEROBIC AND ANAEROBIC Blood Culture results may not be optimal due to an inadequate volume of blood received in culture bottles   Culture   Final    NO GROWTH 5 DAYS Performed at [facility], [address] [place], [place] [address]    Report Status 09/14/2020 FINAL  Final  Respiratory Panel by RT PCR (Flu A&B, Covid) - Nasopharyngeal Swab     Status: None   Collection Time: 09/09/20 12:34 PM   Specimen: Nasopharyngeal Swab  Result Value Ref Range Status   SARS Coronavirus 2 by RT PCR NEGATIVE NEGATIVE Final    Comment: (NOTE) SARS-CoV-2 target nucleic acids are NOT DETECTED.  The SARS-CoV-2 RNA is generally detectable in upper  respiratoy specimens during the acute phase of infection. The lowest concentration of SARS-CoV-2 viral copies this assay can detect is 131 copies/mL. A negative result does not preclude SARS-Cov-2 infection and should not be used as the sole basis for treatment or other patient management decisions. A negative result may occur with  improper specimen collection/handling, submission of specimen other than nasopharyngeal swab, presence of viral mutation(s) within the areas targeted by this assay, and inadequate number of viral copies (<131 copies/mL). A negative result must be combined with clinical observations, patient history, and epidemiological information. The expected result is Negative.  Fact Sheet for Patients:   https://www.moore.com/  Fact Sheet for Healthcare Providers:  https://www.young.biz/  This test is no t yet approved or cleared by the [place] FDA and  has been authorized for detection and/or diagnosis of SARS-CoV-2 by FDA under an Emergency Use Authorization (EUA). This EUA will remain  in effect (meaning this test can be used) for the duration of the COVID-19 declaration under Section 564(b)(1) of the Act, 21 U.S.C. section 360bbb-3(b)(1), unless the authorization is terminated or revoked sooner.     Influenza A by PCR NEGATIVE NEGATIVE Final   Influenza B by PCR NEGATIVE NEGATIVE Final    Comment: (NOTE) The Xpert Xpress SARS-CoV-2/FLU/RSV assay is intended as an aid in  the diagnosis of influenza from Nasopharyngeal swab specimens and  should not be used as a sole basis for treatment. Nasal washings and  aspirates are unacceptable for Xpert Xpress SARS-CoV-2/FLU/RSV  testing.  Fact Sheet for Patients: https://www.moore.com/  Fact Sheet for Healthcare Providers: https://www.young.biz/  This test is not yet approved or cleared by the [place] FDA and  has been authorized for detection and/or diagnosis of SARS-CoV-2 by  FDA under an Emergency Use Authorization (EUA). This EUA will remain  in effect (meaning this test can be used) for the duration of the  Covid-19 declaration under Section 564(b)(1) of the Act, 21  U.S.C. section 360bbb-3(b)(1), unless the authorization is  terminated or revoked. Performed at [facility], [address] [place], [place] [address]   Blood culture (single)     Status: None   Collection Time: 09/09/20  5:11 PM   Specimen: BLOOD  Result Value Ref Range Status   Specimen Description BLOOD RIGHT ANTECUBITAL  Final   Special Requests   Final    BOTTLES DRAWN AEROBIC AND ANAEROBIC Blood Culture adequate volume   Culture   Final    NO GROWTH 5  DAYS Performed at [facility], [address] [place], [place] [address]    Report Status 09/14/2020 FINAL  Final    Procedures and diagnostic studies:  No results found.             LOS: 7 days   Devian Bartolomei  [facility]   Pager on [email or web address]. If 7PM-7AM, please contact night-coverage at [email or web address]     09/17/2020, 2:53 PM

## 2020-09-17 NOTE — Progress Notes (Signed)
Occupational Therapy Treatment Patient Details Name: Marilyn Andrews MRN: [id number] DOB: August 19, 1936 Today's Date: 09/17/2020    History of present illness Marilyn Andrews is a 84 y.o. female with medical history significant of hypertension, GERD, depression, anxiety, hard of hearing, diverticulitis, who presents with abdominal pain. Patient states that she has abdominal pain which has been going on for almost 2 weeks, and progressively worsening.  Her abdominal pain is located in the bilateral lower abdomen, constant, sharp, moderate, nonradiating.  She also has nausea and mild vomiting for few times, no diarrhea.  Patient states that she is mildly constipated.  No fever or chills.  Patient does not have chest pain, cough, shortness breath, symptoms of UTI or unilateral weakness. Patient has a burning on urination, no dysuria or hematuria   OT comments  Upon entering the room, pt supine in bed with RN present providing medications. Pt reporting nausea at beginning of session but does not mention further unless asked. Pt very self limiting and requiring maximal encouragement for participation. Pt able to use shower cap to clean hair, comb, and place bobby pins with encouragement although pt reports she is unable secondary to arthritis. Pt standing from bed with min A and transfers to [facility] with min A and use of RW. Pt able to have BM and standing with min A for standing balance while pt performs hygiene. Pt transfers into recliner chair in same manner. All needs within reach. Pt will need short term rehab stay before returning home. Pt continues to benefit from acute OT intervention.    Follow Up Recommendations  SNF    Equipment Recommendations  Other (comment) (defer to next venue of care)    Recommendations for Other Services      Precautions / Restrictions Precautions Precautions: Fall Precaution Comments: DNR       Mobility Bed Mobility Overal bed mobility: Needs Assistance Bed Mobility:  Supine to Sit     Supine to sit: Min assist     General bed mobility comments: min A from flat bed to EOB with trunk support  Transfers Overall transfer level: Needs assistance Equipment used: Rolling walker (2 wheeled) Transfers: Sit to/from [employer] Sit to Stand: Min assist Stand pivot transfers: Min assist       General transfer comment: min cuing for hand placement with use of RW and min A for standing balance with transfer    Balance Overall balance assessment: Needs assistance Sitting-balance support: Single extremity supported;Feet unsupported Sitting balance-Leahy Scale: Fair Sitting balance - Comments: sitting EOB without BLE support & no LOB noted   Standing balance support: Bilateral upper extremity supported;During functional activity Standing balance-Leahy Scale: Fair Standing balance comment: reliance on RW for UE support            ADL either performed or assessed with clinical judgement   ADL Overall ADL's : Needs assistance/impaired     Grooming: Wash/dry hands;Wash/dry face;Sitting;Set up      Toilet Transfer: Minimal assistance;BSC;RW   Toileting- Clothing Manipulation and Hygiene: Minimal assistance;Sit to/from stand      General ADL Comments: Min A for stand pivot transfer to [facility] with use of RW. Pt needing min A for standing balance while performing hygiene needs with maximal encouragement,     Vision Patient Visual Report: No change from baseline            Cognition Arousal/Alertness: Awake/alert Behavior During Therapy: Flat affect Overall Cognitive Status: No family/caregiver present to determine baseline cognitive functioning  Pertinent Vitals/ Pain       Pain Assessment: Faces Faces Pain Scale: No hurt         Frequency  Min 1X/week        Progress Toward Goals  OT Goals(current goals can now be found in the care plan section)  Progress towards OT goals:  Progressing toward goals  Acute Rehab OT Goals Patient Stated Goal: to go home Time For Goal Achievement: 09/24/20 Potential to Achieve Goals: Good  Plan Discharge plan needs to be updated       AM-PAC OT "6 Clicks" Daily Activity     Outcome Measure   Help from another person eating meals?: A Little Help from another person taking care of personal grooming?: A Little Help from another person toileting, which includes using toliet, bedpan, or urinal?: A Lot Help from another person bathing (including washing, rinsing, drying)?: A Lot Help from another person to put on and taking off regular upper body clothing?: A Little Help from another person to put on and taking off regular lower body clothing?: A Lot 6 Click Score: 15    End of Session Equipment Utilized During Treatment: Rolling walker;Other (comment) (BSC)  OT Visit Diagnosis: Unsteadiness on feet (R26.81);Muscle weakness (generalized) (M62.81);Other symptoms and signs involving cognitive function   Activity Tolerance Other (comment) (Pt is self limiting)   Patient Left in chair;with call bell/phone within reach;with chair alarm set   Nurse Communication Mobility status;Precautions        Time: 1030-1110 OT Time Calculation (min): 40 min  Charges: OT General Charges $OT Visit: 1 Visit OT Treatments $Self Care/Home Management : 38-52 mins  [name], MS, OTR/L , CBIS ascom [phone]  09/17/20, 11:22 AM

## 2020-09-17 NOTE — Progress Notes (Signed)
°   09/17/20 1122  Assess: MEWS Score  Temp 98.6 F (37 C)  BP (!) 154/72  Pulse Rate 75  Resp (!) 30 (RN Stephanee Barcomb notified)  Level of Consciousness Alert  SpO2 94 %  O2 Device Room Air  Assess: MEWS Score  MEWS Temp 0  MEWS Systolic 0  MEWS Pulse 0  MEWS RR 2  MEWS LOC 0  MEWS Score 2  MEWS Score Color Yellow  Assess: if the MEWS score is Yellow or Red  Were vital signs taken at a resting state? Yes  Focused Assessment No change from prior assessment  Early Detection of Sepsis Score *See Row Information* Low  MEWS guidelines implemented *See Row Information* Yes  Treat  MEWS Interventions Escalated (See documentation below)  Pain Scale 0-10  Pain Score 0  Take Vital Signs  Increase Vital Sign Frequency  Yellow: Q 2hr X 2 then Q 4hr X 2, if remains yellow, continue Q 4hrs  Escalate  MEWS: Escalate Yellow: discuss with charge nurse/RN and consider discussing with provider and RRT  Notify: Charge Nurse/RN  Name of Charge Nurse/RN Notified Ree Kida  Date Charge Nurse/RN Notified 09/17/20  Time Charge Nurse/RN Notified 1135  Notify: Provider  Provider Name/Title Lurene Shadow  Date Provider Notified 09/17/20  Time Provider Notified 1133  Notification Type Page  Notification Reason Change in status (RR 30)  Response Other (Comment) (No response yet)

## 2020-09-17 NOTE — Plan of Care (Signed)
  Problem: Education: Goal: Knowledge of General Education information will improve Description: Including pain rating scale, medication(s)/side effects and non-pharmacologic comfort measures 09/17/2020 1200 by Ansel Bong, RN Outcome: Progressing 09/17/2020 1200 by Ansel Bong, RN Outcome: Progressing   Problem: Health Behavior/Discharge Planning: Goal: Ability to manage health-related needs will improve 09/17/2020 1200 by Ansel Bong, RN Outcome: Progressing 09/17/2020 1200 by Ansel Bong, RN Outcome: Progressing   Problem: Clinical Measurements: Goal: Ability to maintain clinical measurements within normal limits will improve 09/17/2020 1200 by Ansel Bong, RN Outcome: Progressing 09/17/2020 1200 by Ansel Bong, RN Outcome: Progressing Goal: Will remain free from infection 09/17/2020 1200 by Ansel Bong, RN Outcome: Progressing 09/17/2020 1200 by Ansel Bong, RN Outcome: Progressing Goal: Diagnostic test results will improve 09/17/2020 1200 by Ansel Bong, RN Outcome: Progressing 09/17/2020 1200 by Ansel Bong, RN Outcome: Progressing Goal: Respiratory complications will improve 09/17/2020 1200 by Ansel Bong, RN Outcome: Progressing 09/17/2020 1200 by Ansel Bong, RN Outcome: Progressing Goal: Cardiovascular complication will be avoided 09/17/2020 1200 by Ansel Bong, RN Outcome: Progressing 09/17/2020 1200 by Ansel Bong, RN Outcome: Progressing   Problem: Activity: Goal: Risk for activity intolerance will decrease 09/17/2020 1200 by Ansel Bong, RN Outcome: Progressing 09/17/2020 1200 by Ansel Bong, RN Outcome: Progressing   Problem: Nutrition: Goal: Adequate nutrition will be maintained 09/17/2020 1200 by Ansel Bong, RN Outcome: Progressing 09/17/2020 1200 by Ansel Bong, RN Outcome: Progressing   Problem: Coping: Goal: Level of anxiety will decrease 09/17/2020 1200 by Ansel Bong, RN Outcome:  Progressing 09/17/2020 1200 by Ansel Bong, RN Outcome: Progressing   Problem: Elimination: Goal: Will not experience complications related to bowel motility 09/17/2020 1200 by Ansel Bong, RN Outcome: Progressing 09/17/2020 1200 by Ansel Bong, RN Outcome: Progressing Goal: Will not experience complications related to urinary retention 09/17/2020 1200 by Ansel Bong, RN Outcome: Progressing 09/17/2020 1200 by Ansel Bong, RN Outcome: Progressing   Problem: Pain Managment: Goal: General experience of comfort will improve 09/17/2020 1200 by Ansel Bong, RN Outcome: Progressing 09/17/2020 1200 by Ansel Bong, RN Outcome: Progressing   Problem: Safety: Goal: Ability to remain free from injury will improve 09/17/2020 1200 by Ansel Bong, RN Outcome: Progressing 09/17/2020 1200 by Ansel Bong, RN Outcome: Progressing   Problem: Skin Integrity: Goal: Risk for impaired skin integrity will decrease 09/17/2020 1200 by Ansel Bong, RN Outcome: Progressing 09/17/2020 1200 by Ansel Bong, RN Outcome: Progressing

## 2020-09-17 NOTE — Progress Notes (Signed)
Physical Therapy Treatment Patient Details Name: Marilyn Andrews MRN: [id number] DOB: 1936/02/27 Today's Date: 09/17/2020    History of Present Illness Marilyn Andrews is a 84 y.o. female with medical history significant of hypertension, GERD, depression, anxiety, hard of hearing, diverticulitis, who presents with abdominal pain. Patient states that she has abdominal pain which has been going on for almost 2 weeks, and progressively worsening.  Her abdominal pain is located in the bilateral lower abdomen, constant, sharp, moderate, nonradiating.  She also has nausea and mild vomiting for few times, no diarrhea.  Patient states that she is mildly constipated.  No fever or chills.  Patient does not have chest pain, cough, shortness breath, symptoms of UTI or unilateral weakness. Patient has a burning on urination, no dysuria or hematuria    PT Comments    Pt initially declined participation with PT services but with encouragement agreed to supine therex only. Pt declined sitting/standing secondary to nausea and stated that her nausea gets worse with OOB activity.  Pt education provided on physiological benefits of activity.  Pt participated with below therex with fair effort with frequent short therapeutic rest breaks needed.  Nursing notified of pt request for nausea medication.  Pt will benefit from PT services in a SNF setting upon discharge to safely address deficits listed in patient problem list for decreased caregiver assistance and eventual return to PLOF.    Follow Up Recommendations  SNF     Equipment Recommendations  Other (comment) (TBD at next venue of care)    Recommendations for Other Services       Precautions / Restrictions Precautions Precautions: Fall Precaution Comments: DNR Restrictions Weight Bearing Restrictions: No    Mobility  Bed Mobility               General bed mobility comments: Pt declined all but supine therex secondary to nausea that gets worse in  sitting position per patient  Transfers                    Ambulation/Gait                 Stairs             Wheelchair Mobility    Modified Rankin (Stroke Patients Only)       Balance                                            Cognition Arousal/Alertness: Awake/alert Behavior During Therapy: Flat affect Overall Cognitive Status: No family/caregiver present to determine baseline cognitive functioning                                        Exercises Total Joint Exercises Ankle Circles/Pumps: AROM;Strengthening;Both;10 reps;15 reps (with manual resistance) Quad Sets: Strengthening;Both;10 reps;15 reps Gluteal Sets: Strengthening;10 reps;Both Towel Squeeze: Strengthening;Both;10 reps Short Arc Quad: AROM;Strengthening;Both;5 reps;10 reps Heel Slides: AAROM;Both;10 reps Hip ABduction/ADduction: AAROM;Both;10 reps Straight Leg Raises: AAROM;Both;10 reps Other Exercises Other Exercises: BLE supine leg press with manual resistance 2 x 10 Other Exercises: HEP education and review for BLE APs, QS, and GS x 10 each ever 1-2 hours daily Other Exercises: Pt education provided on physiological benefits of activity    General Comments  Pertinent Vitals/Pain Pain Assessment: 0-10 Pain Score: 7  Pain Location: stomach Pain Descriptors / Indicators: Aching Pain Intervention(s): Premedicated before session;Monitored during session;Other (comment) (Nursing notified of pt request for nausea meds)    Home Living                      Prior Function            PT Goals (current goals can now be found in the care plan section) Progress towards PT goals: Not progressing toward goals - comment (limited by nausea; declined OOB activity)    Frequency    Min 2X/week      PT Plan Current plan remains appropriate    Co-evaluation              AM-PAC PT "6 Clicks" Mobility   Outcome Measure   Help needed turning from your back to your side while in a flat bed without using bedrails?: None Help needed moving from lying on your back to sitting on the side of a flat bed without using bedrails?: None Help needed moving to and from a bed to a chair (including a wheelchair)?: A Little Help needed standing up from a chair using your arms (e.g., wheelchair or bedside chair)?: A Little Help needed to walk in hospital room?: A Little Help needed climbing 3-5 steps with a railing? : A Lot 6 Click Score: 19    End of Session   Activity Tolerance: Other (comment) (Pt limited by nausea) Patient left: in bed;with bed alarm set;with call bell/phone within reach Nurse Communication: Mobility status;Other (comment) (Pt requesting nausea medication) PT Visit Diagnosis: Unsteadiness on feet (R26.81);Other abnormalities of gait and mobility (R26.89);Muscle weakness (generalized) (M62.81);Difficulty in walking, not elsewhere classified (R26.2);Dizziness and giddiness (R42);Pain Pain - Right/Left: Left Pain - part of body: Leg     Time: 1637-1700 PT Time Calculation (min) (ACUTE ONLY): 23 min  Charges:  $Therapeutic Exercise: 8-22 mins                     D. Scott Tayshawn Purnell PT, DPT 09/17/20, 5:13 PM

## 2020-09-17 NOTE — TOC Progression Note (Signed)
Transition of Care [facility]) - Progression Note    Patient Details  Name: Dorella Laster Bains MRN: [id number] Date of Birth: Nov 30, 1935  Transition of [facility]) CM/SW Contact  Chapman Fitch, RN Phone Number: 09/17/2020, 10:48 AM  Clinical Narrative:    Followed up with patient today to present bed offers for SNF.   Patient adamantly declines SNF.  Patient requests that I discuss discharge disposition with her daughter in law Olegario Messier. 3 way call completed with daughter in law [place].  Olegario Messier is supportive of patients decision to decline SNF and is in agreement for patient to stay with her after discharge.  Patient in agreement.  Patient will discharge to [address] [facility] [address].  Both are agreeable to home health services and state they do not have a preference of home health.  Referral made and accepted by [facility] with [facility].  Barbara Cower aware of address at discharge.  Patient has RW for ambulation.  BSC to be delivered to room by Elease Hashimoto with [employer].   Expected Discharge Plan: Home w Home Health Services    Expected Discharge Plan and Services Expected Discharge Plan: Home w Home Health Services                                   HH Arranged: Refused SNF, RN, PT, OT, Nurse's Aide           Social Determinants of Health (SDOH) Interventions    Readmission Risk Interventions No flowsheet data found.

## 2020-09-18 DIAGNOSIS — R531 Weakness: Secondary | ICD-10-CM | POA: Diagnosis not present

## 2020-09-18 DIAGNOSIS — K5792 Diverticulitis of intestine, part unspecified, without perforation or abscess without bleeding: Secondary | ICD-10-CM | POA: Diagnosis not present

## 2020-09-18 MED ORDER — ENSURE ENLIVE PO LIQD
237.0000 mL | Freq: Two times a day (BID) | ORAL | Status: DC
  Administered 2020-09-19 – 2020-09-21 (×3): 237 mL via ORAL

## 2020-09-18 MED ORDER — PROCHLORPERAZINE EDISYLATE 10 MG/2ML IJ SOLN
10.0000 mg | Freq: Once | INTRAMUSCULAR | Status: AC
  Administered 2020-09-18: 10 mg via INTRAVENOUS
  Filled 2020-09-18: qty 2

## 2020-09-18 MED ORDER — BACLOFEN 10 MG PO TABS
10.0000 mg | ORAL_TABLET | Freq: Once | ORAL | Status: AC
  Administered 2020-09-18: 10 mg via ORAL
  Filled 2020-09-18: qty 1

## 2020-09-18 MED ORDER — BOOST / RESOURCE BREEZE PO LIQD CUSTOM
1.0000 | Freq: Three times a day (TID) | ORAL | Status: DC
  Administered 2020-09-18: 1 via ORAL

## 2020-09-18 MED ORDER — ADULT MULTIVITAMIN W/MINERALS CH
1.0000 | ORAL_TABLET | Freq: Every day | ORAL | Status: DC
  Administered 2020-09-18 – 2020-09-20 (×3): 1 via ORAL
  Filled 2020-09-18 (×4): qty 1

## 2020-09-18 NOTE — Progress Notes (Signed)
Initial Nutrition Assessment  DOCUMENTATION CODES:   Obesity unspecified  INTERVENTION:   Recommend advancement of pt's diet to full liquids. If patient unable to tolerate full liquids, would recommend nutrition support.   Boost Breeze po TID, each supplement provides 250 kcal and 9 grams of protein  MVI daily   Pt at high refeed risk; recommend monitor potassium, magnesium and phosphorus labs daily until stable  NUTRITION DIAGNOSIS:   Inadequate oral intake related to acute illness as evidenced by other (comment) (pt on clear liquid diet for > 5 days).  GOAL:   Patient will meet greater than or equal to 90% of their needs  MONITOR:   PO intake, Supplement acceptance, Diet advancement, Labs, Weight trends, Skin, I & O's  REASON FOR ASSESSMENT:   NPO/Clear Liquid Diet    ASSESSMENT:   84 y.o. female with medical history significant for hypertension, GERD, depression, anxiety, impaired hearing and diverticulitis who is admitted with acute diverticulitis  RD working remotely.  Unable to reach pt via phone. Per chart review, pt eating 30-60% of her meals during the first several days of her admit. Pt has been advanced to a heart healthy diet on numerous occasions but then develops nausea and vomiting and is downgraded back to clear liquids. Pt has continued to have poor appetite and oral intake in hospital and is now without adequate oral intake for > 7 days. Would recommend advancement of pt's diet to full liquids. RD will add supplements and MVI to help pt meet her estimated needs. Pt is at high refeed risk. There is no recent weight history in chart to determine if any significant recent weight changes. Pt has not been weighed since admit; RD will request weekly weights.   Medications reviewed and include: ciprofloxacin, lovenox, lactulose, metronidazole, protonix  Labs reviewed:   NUTRITION - FOCUSED PHYSICAL EXAM: Unable to perform at this time   Diet Order:   Diet  Order            Diet clear liquid Room service appropriate? Yes; Fluid consistency: Thin  Diet effective now                EDUCATION NEEDS:   Not appropriate for education at this time  Skin:  Skin Assessment: Reviewed RN Assessment (ecchymosis)  Last BM:  11/16- type 7  Height:   Ht Readings from Last 1 Encounters:  09/09/20 5' (1.524 m)    Weight:   Wt Readings from Last 1 Encounters:  09/09/20 81.6 kg    Ideal Body Weight:  45.45 kg  BMI:  Body mass index is 35.15 kg/m.  Estimated Nutritional Needs:   Kcal:  1600-1800kcal/day  Protein:  80-90g/day  Fluid:  1.4L/day  Betsey Holiday MS, RD, LDN Please refer to [facility] for RD and/or RD on-call/weekend/after hours pager

## 2020-09-18 NOTE — Hospital Course (Addendum)
Brief summary by Dr. Myriam Forehand: "Marilyn Andrews is a 84 y.o. female with medical history significant for hypertension, GERD, depression, anxiety, impaired hearing, diverticulitis, who presented to the hospital with severe abdominal pain.  Abdominal pain has been going on for almost 2 weeks but pain has progressively worsened.   She was admitted to the hospital for acute diverticulitis.  She was treated with empiric IV antibiotics, IV fluids, analgesics and antiemetics.  Her condition has slowly improved.  She was evaluated by PT and OT recommended further rehabilitation in the skilled nursing facility.  However, patient refused to go to SNF.  She prefers to go home with home health therapy."

## 2020-09-18 NOTE — Progress Notes (Signed)
PROGRESS NOTE    Rosealee Recinos Shed   QAS:[id number]  DOB: [birth year]  PCP: [facility]    DOA: 09/09/2020 LOS: 8   Brief Narrative   Brief summary by Dr. Myriam Forehand: "Marilyn Andrews is a 84 y.o. female with medical history significant for hypertension, GERD, depression, anxiety, impaired hearing, diverticulitis, who presented to the hospital with severe abdominal pain.  Abdominal pain has been going on for almost 2 weeks but pain has progressively worsened.   She was admitted to the hospital for acute diverticulitis.  She was treated with empiric IV antibiotics, IV fluids, analgesics and antiemetics.  Her condition has slowly improved.  She was evaluated by PT and OT recommended further rehabilitation in the skilled nursing facility.  However, patient refused to go to SNF.  She prefers to go home with home health therapy."     Assessment & Plan   Principal Problem:   Acute diverticulitis Active Problems:   Diverticulitis   HTN (hypertension)   GERD (gastroesophageal reflux disease)   Depression   Generalized weakness   Acute diverticulitis - Completing course of antibiotics with oral ciprofloxacin and Flagyl today.   Continue analgesia and antiemetics as needed.   Advance diet to full liquids, then to soft diet either dinner or breakfast tomorrow if tolerating. Give antiemetic before PO antibiotics. Per Dr. Myriam Forehand: "Maintaining patient on a regular diet consistently has been challenging because of intermittent nausea, abdominal pain and vomiting.  She does not like the clear liquid diet and normally prefers to be on a regular diet although this has been difficult to tolerate at times."   Hypokalemia - likely due to N/V.  Replaced.  Monitor BMP.  Replace as needed  Prolonged QTc interval - QTc was 509 on admission: Improved to 454.  Resolved.  Hypertension - Continue antihypertensives  Depression and anxiety - Continue Escitalopram  GERD - Continue  Protonix  Generalized weakness - PT and OT recommend discharge to SNF.  However, patient refuses to go to SNF.  She plans to live with her daughter-in-law after discharge.  Obesity: Body mass index is 35.22 kg/m.  Complicates overall care and prognosis  DVT prophylaxis: enoxaparin (LOVENOX) injection 40 mg Start: 09/09/20 2200   Diet:  Diet Orders (From admission, onward)    Start     Ordered   09/18/20 1157  Diet full liquid Room service appropriate? Yes; Fluid consistency: Thin  Diet effective now       Question Answer Comment  Room service appropriate? Yes   Fluid consistency: Thin      09/18/20 1156            Code Status: DNR    Subjective 09/18/20    Patient was sleeping but awoke easily, fell right back to sleep during encounter.  Daughter-in-law at bedside reports pt had a rough night with N/V.  She thinks this was maybe from oral antibiotics given without nausea medication.  Pt had been on advanced diet but reverted as she had more N/V and pain again.  Pt agreeable to try full liquids today.  She denies nausea or abdominal pain right now.   Disposition Plan & Communication   Status is: Inpatient  Remains inpatient appropriate because:Inpatient level of care appropriate due to severity of illness .  Patient with inadequate oral intake, advancing diet from clear liquids today.  Expect d/c home tomorrow if tolerating advanced diet.    Dispo: The patient is from: Home  Anticipated d/c is to: Home with daughter-in-law and [facility]              Anticipated d/c date is: 1 day              Patient currently is not medically stable to d/c.   Family Communication: daughter-in-law at bedside on rounds today    Consults, Procedures, Significant Events   Consultants:   None  Procedures:   none  Antimicrobials:  Anti-infectives (From admission, onward)   Start     Dose/Rate Route Frequency Ordered Stop   09/16/20 1000  ciprofloxacin (CIPRO) tablet 500 mg         500 mg Oral 2 times daily 09/16/20 0757 09/19/20 0759   09/16/20 1000  metroNIDAZOLE (FLAGYL) tablet 500 mg        500 mg Oral Every 8 hours 09/16/20 0757 09/19/20 1359   09/12/20 1145  meropenem (MERREM) 1 g in sodium chloride 0.9 % 100 mL IVPB  Status:  Discontinued        1 g 200 mL/hr over 30 Minutes Intravenous Every 12 hours 09/12/20 1047 09/16/20 0757   09/12/20 1130  meropenem (MERREM) 1 g in sodium chloride 0.9 % 100 mL IVPB  Status:  Discontinued        1 g 200 mL/hr over 30 Minutes Intravenous Every 8 hours 09/12/20 1043 09/12/20 1047   09/11/20 1800  metroNIDAZOLE (FLAGYL) tablet 500 mg  Status:  Discontinued        500 mg Oral Every 8 hours 09/11/20 1133 09/12/20 1043   09/10/20 1100  cefTRIAXone (ROCEPHIN) 2 g in sodium chloride 0.9 % 100 mL IVPB  Status:  Discontinued        2 g 200 mL/hr over 30 Minutes Intravenous Every 24 hours 09/09/20 1247 09/12/20 1043   09/09/20 1800  metroNIDAZOLE (FLAGYL) IVPB 500 mg  Status:  Discontinued        500 mg 100 mL/hr over 60 Minutes Intravenous Every 8 hours 09/09/20 1247 09/11/20 1133   09/09/20 1130  cefTRIAXone (ROCEPHIN) 2 g in sodium chloride 0.9 % 100 mL IVPB        2 g 200 mL/hr over 30 Minutes Intravenous  Once 09/09/20 1120 09/09/20 1233   09/09/20 1130  metroNIDAZOLE (FLAGYL) IVPB 500 mg        500 mg 100 mL/hr over 60 Minutes Intravenous  Once 09/09/20 1120 09/09/20 1455         Objective   Vitals:   09/18/20 1041 09/18/20 1209 09/18/20 1554 09/18/20 1700  BP:  135/64 (!) 162/66 (!) 143/54  Pulse:  67 82 87  Resp:  16 16 16   Temp:  98.5 F (36.9 C) 98.3 F (36.8 C) 98 F (36.7 C)  TempSrc:   Oral Oral  SpO2:  92% 92% 94%  Weight: 81.8 kg     Height:        Intake/Output Summary (Last 24 hours) at 09/18/2020 1724 Last data filed at 09/18/2020 1100 Gross per 24 hour  Intake --  Output 1300 ml  Net -1300 ml   Filed Weights   09/09/20 0851 09/18/20 1041  Weight: 81.6 kg 81.8 kg    Physical  Exam:  General exam: awake, alert, no acute distress, obese HEENT: dry mucus membranes, hearing grossly normal  Respiratory system: CTAB, no wheezes, rales or rhonchi, normal respiratory effort. Cardiovascular system: normal S1/S2, RRR, no pedal edema.   Gastrointestinal system: soft, NT, ND, +bowel sounds. Central nervous system:  no gross focal neurologic deficits, normal speech Extremities: moves all, no cyanosis, normal tone Psychiatry: normal mood, flat affect  Labs   Data Reviewed: I have personally reviewed following labs and imaging studies  CBC: Recent Labs  Lab 09/12/20 0342 09/13/20 0426  WBC 8.9 6.5  NEUTROABS 6.6 3.7  HGB 13.4 13.0  HCT 40.9 39.5  MCV 92.1 91.4  PLT 307 359   Basic Metabolic Panel: Recent Labs  Lab 09/12/20 0342 09/13/20 0426 09/17/20 0645  NA 142 139 139  K 4.1 3.6 3.9  CL 110 108 103  CO2 26 25 27   GLUCOSE 99 87 99  BUN 10 7* 8  CREATININE 0.86 0.83 0.86  CALCIUM 7.7* 8.0* 8.5*   GFR: Estimated Creatinine Clearance: 47.8 mL/min (by C-G formula based on SCr of 0.86 mg/dL). Liver Function Tests: No results for input(s): AST, ALT, ALKPHOS, BILITOT, PROT, ALBUMIN in the last 168 hours. No results for input(s): LIPASE, AMYLASE in the last 168 hours. No results for input(s): AMMONIA in the last 168 hours. Coagulation Profile: No results for input(s): INR, PROTIME in the last 168 hours. Cardiac Enzymes: No results for input(s): CKTOTAL, CKMB, CKMBINDEX, TROPONINI in the last 168 hours. BNP (last 3 results) No results for input(s): PROBNP in the last 8760 hours. HbA1C: No results for input(s): HGBA1C in the last 72 hours. CBG: No results for input(s): GLUCAP in the last 168 hours. Lipid Profile: No results for input(s): CHOL, HDL, LDLCALC, TRIG, CHOLHDL, LDLDIRECT in the last 72 hours. Thyroid Function Tests: No results for input(s): TSH, T4TOTAL, FREET4, T3FREE, THYROIDAB in the last 72 hours. Anemia Panel: No results for input(s):  VITAMINB12, FOLATE, FERRITIN, TIBC, IRON, RETICCTPCT in the last 72 hours. Sepsis Labs: No results for input(s): PROCALCITON, LATICACIDVEN in the last 168 hours.  Recent Results (from the past 240 hour(s))  Blood culture (single)     Status: None   Collection Time: 09/09/20 10:29 AM   Specimen: BLOOD  Result Value Ref Range Status   Specimen Description BLOOD LEFT ANTECUBITAL  Final   Special Requests   Final    BOTTLES DRAWN AEROBIC AND ANAEROBIC Blood Culture results may not be optimal due to an inadequate volume of blood received in culture bottles   Culture   Final    NO GROWTH 5 DAYS Performed at [facility], [address] [place], [place] [address]    Report Status 09/14/2020 FINAL  Final  Respiratory Panel by RT PCR (Flu A&B, Covid) - Nasopharyngeal Swab     Status: None   Collection Time: 09/09/20 12:34 PM   Specimen: Nasopharyngeal Swab  Result Value Ref Range Status   SARS Coronavirus 2 by RT PCR NEGATIVE NEGATIVE Final    Comment: (NOTE) SARS-CoV-2 target nucleic acids are NOT DETECTED.  The SARS-CoV-2 RNA is generally detectable in upper respiratoy specimens during the acute phase of infection. The lowest concentration of SARS-CoV-2 viral copies this assay can detect is 131 copies/mL. A negative result does not preclude SARS-Cov-2 infection and should not be used as the sole basis for treatment or other patient management decisions. A negative result may occur with  improper specimen collection/handling, submission of specimen other than nasopharyngeal swab, presence of viral mutation(s) within the areas targeted by this assay, and inadequate number of viral copies (<131 copies/mL). A negative result must be combined with clinical observations, patient history, and epidemiological information. The expected result is Negative.  Fact Sheet for Patients:  https://www.moore.com/https://www.fda.gov/media/142436/download  Fact Sheet for Healthcare  Providers:    https://www.young.biz/  This test is no t yet approved or cleared by the [place] and  has been authorized for detection and/or diagnosis of SARS-CoV-2 by FDA under an Emergency Use Authorization (EUA). This EUA will remain  in effect (meaning this test can be used) for the duration of the COVID-19 declaration under Section 564(b)(1) of the Act, 21 U.S.C. section 360bbb-3(b)(1), unless the authorization is terminated or revoked sooner.     Influenza A by PCR NEGATIVE NEGATIVE Final   Influenza B by PCR NEGATIVE NEGATIVE Final    Comment: (NOTE) The Xpert Xpress SARS-CoV-2/FLU/RSV assay is intended as an aid in  the diagnosis of influenza from Nasopharyngeal swab specimens and  should not be used as a sole basis for treatment. Nasal washings and  aspirates are unacceptable for Xpert Xpress SARS-CoV-2/FLU/RSV  testing.  Fact Sheet for Patients: https://www.moore.com/  Fact Sheet for Healthcare Providers: https://www.young.biz/  This test is not yet approved or cleared by the [place] FDA and  has been authorized for detection and/or diagnosis of SARS-CoV-2 by  FDA under an Emergency Use Authorization (EUA). This EUA will remain  in effect (meaning this test can be used) for the duration of the  Covid-19 declaration under Section 564(b)(1) of the Act, 21  U.S.C. section 360bbb-3(b)(1), unless the authorization is  terminated or revoked. Performed at [facility], [address] [place], [place] [address]   Blood culture (single)     Status: None   Collection Time: 09/09/20  5:11 PM   Specimen: BLOOD  Result Value Ref Range Status   Specimen Description BLOOD RIGHT ANTECUBITAL  Final   Special Requests   Final    BOTTLES DRAWN AEROBIC AND ANAEROBIC Blood Culture adequate volume   Culture   Final    NO GROWTH 5 DAYS Performed at [facility], [address] [place], [place]  [address]    Report Status 09/14/2020 FINAL  Final      Imaging Studies   No results found.   Medications   Scheduled Meds:  amLODipine  5 mg Oral Daily   ciprofloxacin  500 mg Oral BID   enoxaparin (LOVENOX) injection  0.5 mg/kg Subcutaneous Q24H   escitalopram  20 mg Oral Daily   feeding supplement  237 mL Oral BID BM   hydrocortisone cream   Topical BID   lactulose  20 g Oral Daily   metroNIDAZOLE  500 mg Oral Q8H   multivitamin with minerals  1 tablet Oral Daily   pantoprazole  40 mg Oral Daily   Continuous Infusions:  sodium chloride 20 mL (09/18/20 1243)       LOS: 8 days    Time spent: 30 minutes    Pennie Banter, DO [facility]  09/18/2020, 5:24 PM    If 7PM-7AM, please contact night-coverage. How to contact the [facility] Attending or Consulting provider 7A - 7P or covering provider during after hours 7P -7A, for this patient?    1. Check the care team in [facility] and look for a) attending/consulting TR[facility] provider listed and b) the [facility] team listed 2. Log into [email or web address] and use [place]'s universal password to access. If you do not have the password, please contact the hospital operator. 3. Locate the [facility] provider you are looking for under [facility] and page to a number that you can be directly reached. 4. If you still have difficulty reaching the provider, please page the [facility] (Director on Call)  for the Hospitalists listed on amion for assistance.

## 2020-09-19 DIAGNOSIS — K5792 Diverticulitis of intestine, part unspecified, without perforation or abscess without bleeding: Secondary | ICD-10-CM | POA: Diagnosis not present

## 2020-09-19 DIAGNOSIS — R531 Weakness: Secondary | ICD-10-CM | POA: Diagnosis not present

## 2020-09-19 DIAGNOSIS — I10 Essential (primary) hypertension: Secondary | ICD-10-CM | POA: Diagnosis not present

## 2020-09-19 DIAGNOSIS — K219 Gastro-esophageal reflux disease without esophagitis: Secondary | ICD-10-CM | POA: Diagnosis not present

## 2020-09-19 LAB — BASIC METABOLIC PANEL
Anion gap: 11 (ref 5–15)
BUN: 8 mg/dL (ref 8–23)
CO2: 23 mmol/L (ref 22–32)
Calcium: 8.3 mg/dL — ABNORMAL LOW (ref 8.9–10.3)
Chloride: 105 mmol/L (ref 98–111)
Creatinine, Ser: 0.8 mg/dL (ref 0.44–1.00)
GFR, Estimated: >60 mL/min (ref >60)
Glucose, Bld: 122 mg/dL — ABNORMAL HIGH (ref 70–99)
Potassium: 3.4 mmol/L — ABNORMAL LOW (ref 3.5–5.1)
Sodium: 139 mmol/L (ref 135–145)

## 2020-09-19 LAB — MAGNESIUM: Magnesium: 2.2 mg/dL (ref 1.7–2.4)

## 2020-09-19 MED ORDER — POTASSIUM CHLORIDE CRYS ER 20 MEQ PO TBCR
40.0000 meq | EXTENDED_RELEASE_TABLET | Freq: Once | ORAL | Status: AC
  Administered 2020-09-19: 40 meq via ORAL
  Filled 2020-09-19: qty 2

## 2020-09-19 MED ORDER — PANTOPRAZOLE SODIUM 40 MG PO TBEC
40.0000 mg | DELAYED_RELEASE_TABLET | Freq: Two times a day (BID) | ORAL | Status: DC
  Administered 2020-09-19 – 2020-09-21 (×4): 40 mg via ORAL
  Filled 2020-09-19 (×4): qty 1

## 2020-09-19 MED ORDER — ONDANSETRON HCL 4 MG/2ML IJ SOLN
4.0000 mg | Freq: Four times a day (QID) | INTRAMUSCULAR | Status: DC | PRN
  Administered 2020-09-19 – 2020-09-21 (×3): 4 mg via INTRAVENOUS
  Filled 2020-09-19 (×3): qty 2

## 2020-09-19 MED ORDER — SCOPOLAMINE 1 MG/3DAYS TD PT72
1.0000 | MEDICATED_PATCH | TRANSDERMAL | Status: DC
  Administered 2020-09-19: 1.5 mg via TRANSDERMAL
  Filled 2020-09-19: qty 1

## 2020-09-19 MED ORDER — SUCRALFATE 1 GM/10ML PO SUSP
1.0000 g | Freq: Three times a day (TID) | ORAL | Status: DC
  Administered 2020-09-19 – 2020-09-21 (×8): 1 g via ORAL
  Filled 2020-09-19 (×8): qty 10

## 2020-09-19 MED ORDER — ONDANSETRON HCL 4 MG PO TABS
4.0000 mg | ORAL_TABLET | Freq: Three times a day (TID) | ORAL | Status: DC
  Administered 2020-09-19 – 2020-09-20 (×3): 4 mg via ORAL
  Filled 2020-09-19 (×3): qty 1

## 2020-09-19 NOTE — Plan of Care (Signed)
Patient is resting in bed. States her pain is managed but she has nausea. Recommendations for a scopolamine patch. Could try Reglan or Compazine for nausea as well. No change to GOC.   No charge note.

## 2020-09-19 NOTE — Progress Notes (Signed)
PROGRESS NOTE    Chevella Pearce Podgurski   GGE:[id number]  DOB: 1936/07/13  PCP: [facility]    DOA: 09/09/2020 LOS: 9   Brief Narrative   Brief summary by Dr. Myriam Forehand: "Marilyn Andrews is a 84 y.o. female with medical history significant for hypertension, GERD, depression, anxiety, impaired hearing, diverticulitis, who presented to the hospital with severe abdominal pain.  Abdominal pain has been going on for almost 2 weeks but pain has progressively worsened.   She was admitted to the hospital for acute diverticulitis.  She was treated with empiric IV antibiotics, IV fluids, analgesics and antiemetics.  Her condition has slowly improved.  She was evaluated by PT and OT recommended further rehabilitation in the skilled nursing facility.  However, patient refused to go to SNF.  She prefers to go home with home health therapy."     Assessment & Plan   Principal Problem:   Acute diverticulitis Active Problems:   Diverticulitis   HTN (hypertension)   GERD (gastroesophageal reflux disease)   Depression   Generalized weakness   Acute diverticulitis - POA.  Completed course of antibiotics with oral ciprofloxacin and Flagyl 11/17.   --Continue analgesia and antiemetics as needed.   --Advance diet to soft today --> very upset stomach / nausea after breakfast. --Will schedule PO Zofran for 30-60 minutes before meals --Scopolamine patch ordered per palliative care recommendation --Continue PRN IV antiemetics per orders --Increase oral PPI to twice daily --Trial of Carafate before meals Per Dr. Myriam Forehand: "Maintaining patient on a regular diet consistently has been challenging because of intermittent nausea, abdominal pain and vomiting."  Refractory Nausea / Vomiting - long history of this, unclear if due to diverticulitis entirely as patient does not appear to have much increase in pain with oral intake, more nausea.  Chart reviewed and recurrent nausea appears to be chronic issue.   Appears not seen by GI since 2017.   History of Gastritis & Duodenitis per chart review, was diagnosed on EGD back in 2004.   --Outpatient GI referral for further evaluation --Daily weights --Dietician following, appreciate recommendations --Mgmt as above.    Hypokalemia - likely due to N/V, recurrent.  Will replace again today.  Monitor BMP.  Replace as needed.  Prolonged QTc interval - QTc was 509 on admission: Improved to 454.  Resolved.  Monitor.  Hypertension - Continue antihypertensives  Depression and anxiety - Continue Escitalopram  GERD - Continue Protonix  Generalized weakness - PT and OT recommend discharge to SNF.  However, patient refuses to go to SNF.  She plans to live with her daughter-in-law after discharge.  Obesity: Body mass index is 35.22 kg/m.  Complicates overall care and prognosis  DVT prophylaxis: enoxaparin (LOVENOX) injection 40 mg Start: 09/09/20 2200   Diet:  Diet Orders (From admission, onward)    Start     Ordered   09/19/20 0839  DIET SOFT Room service appropriate? Yes; Fluid consistency: Thin  Diet effective now       Question Answer Comment  Room service appropriate? Yes   Fluid consistency: Thin      09/19/20 0838            Code Status: DNR    Subjective 09/19/20    Patient was sleeping but awoke easily this AM.  No family at bedside on rounds.  Patient thinks full liquid diet went okay yesterday, has not eaten breakfast yet.  She recalls having upset stomach again, but not much abdominal pain.  No fever or chills.      Disposition Plan & Communication   Status is: Inpatient  Remains inpatient appropriate because:Inpatient level of care appropriate due to severity of illness .  Patient has persistent nausea/vomiting after meals as diet has advanced.  Medication changes made as above, monitor for improved tolerance for oral intake.  Need to monitor labs due to high risk of refeeding syndrome.  Dispo: The patient is from:  Home              Anticipated d/c is to: Home with daughter-in-law and [facility]              Anticipated d/c date is: 1 day              Patient currently is not medically stable to d/c.   Family Communication: Spoke to daughter-in-law, Olegario Messier, by phone this afternoon   Consults, Procedures, Significant Events   Consultants:   None  Procedures:   none  Antimicrobials:  Anti-infectives (From admission, onward)   Start     Dose/Rate Route Frequency Ordered Stop   09/16/20 1000  ciprofloxacin (CIPRO) tablet 500 mg        500 mg Oral 2 times daily 09/16/20 0757 09/18/20 1905   09/16/20 1000  metroNIDAZOLE (FLAGYL) tablet 500 mg        500 mg Oral Every 8 hours 09/16/20 0757 09/19/20 1359   09/12/20 1145  meropenem (MERREM) 1 g in sodium chloride 0.9 % 100 mL IVPB  Status:  Discontinued        1 g 200 mL/hr over 30 Minutes Intravenous Every 12 hours 09/12/20 1047 09/16/20 0757   09/12/20 1130  meropenem (MERREM) 1 g in sodium chloride 0.9 % 100 mL IVPB  Status:  Discontinued        1 g 200 mL/hr over 30 Minutes Intravenous Every 8 hours 09/12/20 1043 09/12/20 1047   09/11/20 1800  metroNIDAZOLE (FLAGYL) tablet 500 mg  Status:  Discontinued        500 mg Oral Every 8 hours 09/11/20 1133 09/12/20 1043   09/10/20 1100  cefTRIAXone (ROCEPHIN) 2 g in sodium chloride 0.9 % 100 mL IVPB  Status:  Discontinued        2 g 200 mL/hr over 30 Minutes Intravenous Every 24 hours 09/09/20 1247 09/12/20 1043   09/09/20 1800  metroNIDAZOLE (FLAGYL) IVPB 500 mg  Status:  Discontinued        500 mg 100 mL/hr over 60 Minutes Intravenous Every 8 hours 09/09/20 1247 09/11/20 1133   09/09/20 1130  cefTRIAXone (ROCEPHIN) 2 g in sodium chloride 0.9 % 100 mL IVPB        2 g 200 mL/hr over 30 Minutes Intravenous  Once 09/09/20 1120 09/09/20 1233   09/09/20 1130  metroNIDAZOLE (FLAGYL) IVPB 500 mg        500 mg 100 mL/hr over 60 Minutes Intravenous  Once 09/09/20 1120 09/09/20 1455         Objective    Vitals:   09/18/20 2032 09/19/20 0800 09/19/20 1100 09/19/20 1153  BP: (!) 137/55 128/62 (!) 135/59 (!) 123/53  Pulse: 70 77 80 71  Resp: 20 16 18 20   Temp: 98.3 F (36.8 C) 98.7 F (37.1 C) 98.4 F (36.9 C) 98.2 F (36.8 C)  TempSrc:    Oral  SpO2: 96% 94% 94% 91%  Weight:      Height:        Intake/Output Summary (Last 24 hours)  at 09/19/2020 1330 Last data filed at 09/19/2020 0543 Gross per 24 hour  Intake 650 ml  Output 650 ml  Net 0 ml   Filed Weights   09/09/20 0851 09/18/20 1041  Weight: 81.6 kg 81.8 kg    Physical Exam:  General exam: awake, alert, no acute distress, obese Respiratory system: CTAB, no wheezes, rales or rhonchi, normal respiratory effort. Cardiovascular system: normal S1/S2, RRR, no pedal edema.   Gastrointestinal system: soft, mildly tender on palpation mostly on left, ND, +bowel sounds. Central nervous system: no gross focal neurologic deficits, normal speech  Labs   Data Reviewed: I have personally reviewed following labs and imaging studies  CBC: Recent Labs  Lab 09/13/20 0426  WBC 6.5  NEUTROABS 3.7  HGB 13.0  HCT 39.5  MCV 91.4  PLT 359   Basic Metabolic Panel: Recent Labs  Lab 09/13/20 0426 09/17/20 0645 09/19/20 0610  NA 139 139 139  K 3.6 3.9 3.4*  CL 108 103 105  CO2 25 27 23   GLUCOSE 87 99 122*  BUN 7* 8 8  CREATININE 0.83 0.86 0.80  CALCIUM 8.0* 8.5* 8.3*  MG  --   --  2.2   GFR: Estimated Creatinine Clearance: 51.4 mL/min (by C-G formula based on SCr of 0.8 mg/dL). Liver Function Tests: No results for input(s): AST, ALT, ALKPHOS, BILITOT, PROT, ALBUMIN in the last 168 hours. No results for input(s): LIPASE, AMYLASE in the last 168 hours. No results for input(s): AMMONIA in the last 168 hours. Coagulation Profile: No results for input(s): INR, PROTIME in the last 168 hours. Cardiac Enzymes: No results for input(s): CKTOTAL, CKMB, CKMBINDEX, TROPONINI in the last 168 hours. BNP (last 3 results) No  results for input(s): PROBNP in the last 8760 hours. HbA1C: No results for input(s): HGBA1C in the last 72 hours. CBG: No results for input(s): GLUCAP in the last 168 hours. Lipid Profile: No results for input(s): CHOL, HDL, LDLCALC, TRIG, CHOLHDL, LDLDIRECT in the last 72 hours. Thyroid Function Tests: No results for input(s): TSH, T4TOTAL, FREET4, T3FREE, THYROIDAB in the last 72 hours. Anemia Panel: No results for input(s): VITAMINB12, FOLATE, FERRITIN, TIBC, IRON, RETICCTPCT in the last 72 hours. Sepsis Labs: No results for input(s): PROCALCITON, LATICACIDVEN in the last 168 hours.  Recent Results (from the past 240 hour(s))  Blood culture (single)     Status: None   Collection Time: 09/09/20  5:11 PM   Specimen: BLOOD  Result Value Ref Range Status   Specimen Description BLOOD RIGHT ANTECUBITAL  Final   Special Requests   Final    BOTTLES DRAWN AEROBIC AND ANAEROBIC Blood Culture adequate volume   Culture   Final    NO GROWTH 5 DAYS Performed at [facility], [address], [place], [place]    Report Status 09/14/2020 FINAL  Final      Imaging Studies   No results found.   Medications   Scheduled Meds:  amLODipine  5 mg Oral Daily   enoxaparin (LOVENOX) injection  0.5 mg/kg Subcutaneous Q24H   escitalopram  20 mg Oral Daily   feeding supplement  237 mL Oral BID BM   hydrocortisone cream   Topical BID   lactulose  20 g Oral Daily   metroNIDAZOLE  500 mg Oral Q8H   multivitamin with minerals  1 tablet Oral Daily   pantoprazole  40 mg Oral Daily   scopolamine  1 patch Transdermal Q72H   Continuous Infusions:  sodium chloride 20 mL (  09/18/20 1243)       LOS: 9 days    Time spent: 30 minutes with >50% spent in coordination of care and direct patient contact.    Pennie BanterKelly A Vaden Becherer, DO [facility]  09/19/2020, 1:30 PM    If 7PM-7AM, please contact night-coverage. How to contact the [facility] Attending or Consulting  provider 7A - 7P or covering provider during after hours 7P -7A, for this patient?    1. Check the care team in [facility] and look for a) attending/consulting [facility] provider listed and b) the [facility] team listed 2. Log into [email or web address] and use [place]'s universal password to access. If you do not have the password, please contact the hospital operator. 3. Locate the [facility] provider you are looking for under [facility] and page to a number that you can be directly reached. 4. If you still have difficulty reaching the provider, please page the [facility] (Director on Call) for the Hospitalists listed on amion for assistance.

## 2020-09-19 NOTE — Care Management Important Message (Signed)
Important Message  Patient Details  Name: Marilyn Andrews MRN: [id number] Date of Birth: 19-Oct-1936   Medicare Important Message Given:  Yes     [name] 09/19/2020, 1:37 PM

## 2020-09-20 ENCOUNTER — Inpatient Hospital Stay: Payer: Medicare Other

## 2020-09-20 DIAGNOSIS — K219 Gastro-esophageal reflux disease without esophagitis: Secondary | ICD-10-CM | POA: Diagnosis not present

## 2020-09-20 DIAGNOSIS — K5792 Diverticulitis of intestine, part unspecified, without perforation or abscess without bleeding: Secondary | ICD-10-CM | POA: Diagnosis not present

## 2020-09-20 DIAGNOSIS — R531 Weakness: Secondary | ICD-10-CM | POA: Diagnosis not present

## 2020-09-20 LAB — BASIC METABOLIC PANEL
Anion gap: 7 (ref 5–15)
BUN: 11 mg/dL (ref 8–23)
CO2: 24 mmol/L (ref 22–32)
Calcium: 8.2 mg/dL — ABNORMAL LOW (ref 8.9–10.3)
Chloride: 107 mmol/L (ref 98–111)
Creatinine, Ser: 0.87 mg/dL (ref 0.44–1.00)
GFR, Estimated: >60 mL/min (ref >60)
Glucose, Bld: 101 mg/dL — ABNORMAL HIGH (ref 70–99)
Potassium: 3.9 mmol/L (ref 3.5–5.1)
Sodium: 138 mmol/L (ref 135–145)

## 2020-09-20 LAB — MAGNESIUM: Magnesium: 2.3 mg/dL (ref 1.7–2.4)

## 2020-09-20 LAB — PHOSPHORUS: Phosphorus: 2.9 mg/dL (ref 2.5–4.6)

## 2020-09-20 MED ORDER — METOCLOPRAMIDE HCL 10 MG PO TABS
10.0000 mg | ORAL_TABLET | Freq: Three times a day (TID) | ORAL | Status: DC
  Administered 2020-09-20 – 2020-09-21 (×4): 10 mg via ORAL
  Filled 2020-09-20 (×4): qty 1

## 2020-09-20 NOTE — TOC Progression Note (Signed)
Transition of Care [facility]) - Progression Note    Patient Details  Name: Marilyn Andrews MRN: [id number] Date of Birth: 1935-11-15  Transition of Care [facility] CM/SW Contact  Chapman Fitch, RN Phone Number: 09/20/2020, 1:48 PM  Clinical Narrative:     GI consult today.  Plan remains for patient to discharge to daughter in laws home with home health services through [facility] once medically cleared   Expected Discharge Plan: Home w Home Health Services    Expected Discharge Plan and Services Expected Discharge Plan: Home w Home Health Services                                   HH Arranged: Refused SNF, RN, PT, OT, Nurse's Aide           Social Determinants of Health (SDOH) Interventions    Readmission Risk Interventions No flowsheet data found.

## 2020-09-20 NOTE — Consult Note (Signed)
GI Inpatient Consult Note  Reason for Consult: Refractory nausea and vomiting    Attending Requesting Consult: Dr. Esaw GrandchildKelly  Griffith, DO  History of Present Illness: Marilyn Andrews is a 84 y.o. female seen for evaluation of refractory nausea and vomiting at the request of Dr. Esaw GrandchildKelly Griffith. Pt has a PMH of HTN, depression, GERD, depression, anxiety, impaired hearing, recurrent diverticulitis. She is currently on Day 11 of admission initially presented to the ED 11/08 with acute uncomplicated diverticulitis involving the proximal to mid sigmoid colon. Patient was treated with course of Ciprofloxacin and Flagyl and completed this 11/17 with resolution of abdominal pain. There have reportedly been issues with refractory nausea and vomiting and GI was consulted for further evaluation. Per hospitalist, maintaining patient on regular diet consistently has been challenging due to nausea, vomiting, and abd pain. Patient has been getting scheduled PO Zofran 30-60 minutes before meals, scopolamine patch, oral PPI, and trial of carafate before meals. She has hx of recurrent sigmoid diverticulitis requiring hospital admission. She was admitted May 2021 and May 2019 for uncomplicated sigmoid diverticulitis. Pt reports her issues with nausea have been ongoing for years, but have seemed to been much worse here in the hospital. She reports she is on Protonix and Zantac at home for heartburn and reflux and these symptoms are not well-controlled. She can still have issues with pyrosis and volume regurgitation of acid 3-4 days out of the week, worse after her evening meal. She reports biggest issue currently is persistent nausea which doesn't seem to go away despite oral medications. Vomiting has slowed down and reports her last emesis episode was yesterday. She feels like she fills up easily just after few bites of food and can't eat anymore. She does not feel like food or liquid is getting stuck or hanging up in her esophagus.  She denies frequent coughing or aspiration when she is swallowing. She has noticed the inability to feel like she can take a full inspiration when she is eating. She has had some chest pains with swallowing. She denies odynophagia. Chest pain is not associated with exertion. No palpitations, numbness, tingling, or syncopal episodes. [facility] Dr. Bluford Kaufmannh for recurrent C diff infection. She has had history of chronic nausea dating back to this time refractory to antiemetics. She was scheduled for bidirectional endoscopy, but did not follow through with procedures. Last endoscopy 2007 reportedly showed hiatal hernia. She denies any frequent NSAID use. She denies any abdominal pain. She denies any hematochezia, melena, or diarrhea.     Past Medical History:  Past Medical History:  Diagnosis Date  . Anxiety   . Arthritis   . Depression   . Diverticulitis   . Dyspnea   . Edema    FEET/LEGS  . HOH (hard of hearing)   . Hypertension     Problem List: Patient Active Problem List   Diagnosis Date Noted  . Generalized weakness 09/17/2020  . GERD (gastroesophageal reflux disease) 09/09/2020  . Depression 09/09/2020  . HTN (hypertension) 03/05/2020  . UTI (urinary tract infection) 07/28/2018  . Severe recurrent major depression without psychotic features (HCC) 03/08/2018  . Acute diverticulitis 03/07/2018  . Diverticulitis 07/01/2016    Past Surgical History: Past Surgical History:  Procedure Laterality Date  . ABDOMINAL HYSTERECTOMY    . APPENDECTOMY    . BACK SURGERY    . CATARACT EXTRACTION W/PHACO Right 08/30/2018   Procedure: CATARACT EXTRACTION PHACO AND INTRAOCULAR LENS PLACEMENT (IOC);  Surgeon: Galen Manila, MD;  Location: [facility] ORS;  Service: Ophthalmology;  Laterality: Right;  [place] 01:07.1 CDE 13.66 Fluid Pack Lot # [id number]  . CATARACT EXTRACTION W/PHACO Left 09/20/2018   Procedure: CATARACT EXTRACTION PHACO AND INTRAOCULAR LENS PLACEMENT  (IOC);  Surgeon: Galen Manila, MD;  Location: [facility] ORS;  Service: Ophthalmology;  Laterality: Left;  [place] 01:09 CDE 11.45 fluid pack lot # [id number]  . CHOLECYSTECTOMY    . HYSTERECTOMY ABDOMINAL WITH SALPINGECTOMY      Allergies: Allergies  Allergen Reactions  . Penicillins Other (See Comments)    Reaction as a child, patient does not recall  Has patient had a PCN reaction causing immediate rash, facial/tongue/throat swelling, SOB or lightheadedness with hypotension: unknown Has patient had a PCN reaction causing severe rash involving mucus membranes or skin necrosis: unknown Has patient had a PCN reaction that required hospitalization: unknown Has patient had a PCN reaction occurring within the last 10 years: No If all of the above answers are "NO", then may proceed with Cephalosporin use.  . Sulfa Antibiotics Nausea Only    Home Medications: Medications Prior to Admission  Medication Sig Dispense Refill Last Dose  . docusate sodium (COLACE) 100 MG capsule Take 200 mg by mouth 2 (two) times daily as needed for mild constipation.   unknown at prn  . fluticasone (FLONASE) 50 MCG/ACT nasal spray Place 2 sprays into both nostrils 2 (two) times daily as needed for allergies or rhinitis.    09/08/2020 at 1600  . LORazepam (ATIVAN) 0.5 MG tablet Take 0.5 mg by mouth 2 (two) times daily as needed.   09/08/2020 at 1600  . promethazine (PHENERGAN) 25 MG tablet Take 12-25 mg by mouth every 6 (six) hours as needed for nausea or vomiting.   unknown at prn  . acetaminophen (TYLENOL) 500 MG tablet Take 1,000 mg by mouth every 6 (six) hours as needed for mild pain or moderate pain.    unknown at prn  . amLODipine (NORVASC) 5 MG tablet Take 5 mg by mouth daily.   09/07/2020 at 1800  . escitalopram (LEXAPRO) 20 MG tablet Take 1 tablet (20 mg total) by mouth daily. 30 tablet 0 09/07/2020 at 1800  . HYDROcodone-acetaminophen (NORCO/VICODIN) 5-325 MG tablet Take 1 tablet by mouth every 6 (six) hours as  needed for moderate pain. (Patient not taking: Reported on 09/09/2020)   Not Taking at Unknown time  . ibuprofen (ADVIL,MOTRIN) 200 MG tablet Take 800 mg by mouth every 6 (six) hours as needed for headache or moderate pain.   unknown at prn  . lactulose (CEPHULAC) 20 g packet Take 1 packet (20 g total) by mouth daily as needed. 30 each 0 unknown at prn  . mirtazapine (REMERON SOL-TAB) 15 MG disintegrating tablet Take 15 mg by mouth at bedtime. (Patient not taking: Reported on 09/09/2020)   Not Taking at Unknown time  . ondansetron (ZOFRAN ODT) 4 MG disintegrating tablet Take 1 tablet (4 mg total) by mouth every 8 (eight) hours as needed for nausea or vomiting. 20 tablet 0 unknown at prn  . pantoprazole (PROTONIX) 40 MG tablet Take 40 mg by mouth daily. (Patient not taking: Reported on 09/09/2020)   Not Taking at Unknown time  . ranitidine (ZANTAC) 150 MG tablet Take 150 mg by mouth 3 (three) times daily as needed for heartburn.  (Patient not taking: Reported on 09/09/2020)   Not Taking at Unknown time  . senna-docusate (SENOKOT-S) 8.6-50 MG tablet Take 2 tablets by mouth 2 (two) times  daily as needed for mild constipation. 100 tablet 0 unknown at prn  . triamcinolone cream (KENALOG) 0.1 % Apply 1 application topically daily as needed (rash).    unknown at prn   Home medication reconciliation was completed with the patient.   Scheduled Inpatient Medications:   . amLODipine  5 mg Oral Daily  . enoxaparin (LOVENOX) injection  0.5 mg/kg Subcutaneous Q24H  . escitalopram  20 mg Oral Daily  . feeding supplement  237 mL Oral BID BM  . hydrocortisone cream   Topical BID  . lactulose  20 g Oral Daily  . multivitamin with minerals  1 tablet Oral Daily  . ondansetron  4 mg Oral TID AC  . pantoprazole  40 mg Oral BID  . scopolamine  1 patch Transdermal Q72H  . sucralfate  1 g Oral TID WC & HS    Continuous Inpatient Infusions:   . sodium chloride 20 mL (09/18/20 1243)    PRN Inpatient Medications:   sodium chloride, acetaminophen, docusate sodium, fluticasone, hydrALAZINE, HYDROmorphone (DILAUDID) injection, hydrOXYzine, LORazepam, ondansetron (ZOFRAN) IV, oxyCODONE-acetaminophen, promethazine  Family History: family history includes CAD in her brother; Hypertension in her father.  The patient's family history is negative for inflammatory bowel disorders, GI malignancy, or solid organ transplantation.  Social History:   reports that she has never smoked. She has never used smokeless tobacco. She reports that she does not drink alcohol and does not use drugs. The patient denies ETOH, tobacco, or drug use.   Review of Systems: Constitutional: Weight is stable.  Eyes: No changes in vision. ENT: No oral lesions, sore throat.  GI: see HPI.  Heme/Lymph: No easy bruising.  CV: No chest pain.  GU: No hematuria.  Integumentary: No rashes.  Neuro: No headaches.  Psych: No depression/anxiety.  Endocrine: No heat/cold intolerance.  Allergic/Immunologic: No urticaria.  Resp: No cough, SOB.  Musculoskeletal: No joint swelling.    Physical Examination: BP (!) 128/52 (BP Location: Right Arm)   Pulse (!) 53   Temp 98 F (36.7 C) (Oral)   Resp 16   Ht 5' (1.524 m)   Wt 81.8 kg   SpO2 94%   BMI 35.22 kg/m  Chronically ill-appearing female laying in hospital bed. No accessory muscle use when breathing. Hard of hearing.  Gen: NAD, alert and oriented x 4 HEENT: PEERLA, EOMI, Neck: supple, no JVD or thyromegaly Chest: CTA bilaterally, no wheezes, crackles, or other adventitious sounds CV: RRR, no m/g/c/r Abd: soft, obese abdomen, ND, +BS in all four quadrants; tender to deep palpation in epigastric and LUQ, no LLQ TTP, no HSM, guarding, ridigity, or rebound tenderness Ext: no edema, well perfused with 2+ pulses, Skin: no rash or lesions noted Lymph: no LAD  Data: Lab Results  Component Value Date   WBC 6.5 09/13/2020   HGB 13.0 09/13/2020   HCT 39.5 09/13/2020   MCV 91.4  09/13/2020   PLT 359 09/13/2020   No results for input(s): HGB in the last 168 hours. Lab Results  Component Value Date   NA 138 09/20/2020   K 3.9 09/20/2020   CL 107 09/20/2020   CO2 24 09/20/2020   BUN 11 09/20/2020   CREATININE 0.87 09/20/2020   Lab Results  Component Value Date   ALT 12 09/09/2020   AST 18 09/09/2020   ALKPHOS 83 09/09/2020   BILITOT 1.0 09/09/2020   No results for input(s): APTT, INR, PTT in the last 168 hours. Assessment/Plan:  84 y/o Caucasian female with a  PMH of HTN, depression, GERD, depression, anxiety, impaired hearing, recurrent diverticulitis admitted for several abdominal pain 11/08 and found to have uncomplicated sigmoid diverticulitis. GI consulted for evaluation and management of refractory nausea and vomiting despite use of oral and IV antiemetics  1. Refractory nausea and vomiting - DDx includes gastritis, peptic ulcer disease +/- H pylori, GOO, gastroparesis, functional dyspepsia, medications/toxins, endocrine abnormality such as adrenal insufficiency, thyroid disorders, migraine headaches, CNS disorder, psychiatric disorders, etc  2. Acute uncomplicated sigmoid diverticulitis - s/p treatment with course of Ciprofloxacin and Flagyl. Abdominal pain has resolved.   3. Hypokalemia - resolved. Likely 2/2 GI losses.   4. Depression and anxiety - on escitalopram at home  5. GERD - on Protonix at home  6. Obesity - BMI 35  Recommendations:  - Advise Upper GI series for structural evaluation to rule out overt mass lesion, dysmotility, ulceration, gastritis, GOO, etc - Start Reglan 10 mg TID AC and qHS. D/C Zofran. - Continue scopolamine patch  - Encouraged small, frequent meals during the day. Bland foods. - Continue acid suppression therapy. - If UGIS is abnormal, consider utility of EGD as inpatient - If UGIS is negative, consider further work-up with GES - Following  Thank you for the consult. Please call with questions or  concerns.  Mickle Mallory [facility] [phone] (Cell)

## 2020-09-20 NOTE — Progress Notes (Signed)
PROGRESS NOTE    Marilyn Andrews   WUJ:[id number]  DOB: 11/04/1935  PCP: [facility]    DOA: 09/09/2020 LOS: 10   Brief Narrative   Brief summary by Dr. Myriam ForehandAyiku: "Marilyn Val EagleO Andrews is a 84 y.o. female with medical history significant for hypertension, GERD, depression, anxiety, impaired hearing, diverticulitis, who presented to the hospital with severe abdominal pain.  Abdominal pain has been going on for almost 2 weeks but pain has progressively worsened.   She was admitted to the hospital for acute diverticulitis.  She was treated with empiric IV antibiotics, IV fluids, analgesics and antiemetics.  Her condition has slowly improved.  She was evaluated by PT and OT recommended further rehabilitation in the skilled nursing facility.  However, patient refused to go to SNF.  She prefers to go home with home health therapy."     Assessment & Plan   Principal Problem:   Acute diverticulitis Active Problems:   Diverticulitis   HTN (hypertension)   GERD (gastroesophageal reflux disease)   Depression   Generalized weakness   Acute diverticulitis - POA.  Completed course of antibiotics with oral ciprofloxacin and Flagyl 11/17.   --Continue analgesia and antiemetics as needed.   --Advance diet to soft today --> very upset stomach / nausea after breakfast. --Will schedule PO Zofran for 30-60 minutes before meals --Scopolamine patch ordered per palliative care recommendation --Continue PRN IV antiemetics per orders --Increase oral PPI to twice daily --Trial of Carafate before meals Per Dr. Myriam ForehandAyiku: "Maintaining patient on a regular diet consistently has been challenging because of intermittent nausea, abdominal pain and vomiting."  Refractory Nausea / Vomiting - long history of this, unclear if due to diverticulitis entirely as patient does not appear to have much increase in pain with oral intake, more nausea.  Chart reviewed and recurrent nausea appears to be chronic issue.   Appears not seen by GI since 2017.   History of Gastritis & Duodenitis per chart review, was diagnosed on EGD back in 2004.   --Outpatient GI referral for further evaluation --Daily weights --Dietician following, appreciate recommendations --Mgmt as above.    Hypokalemia - likely due to N/V, recurrent.  Will replace again today.  Monitor BMP.  Replace as needed.  Prolonged QTc interval - QTc was 509 on admission: Improved to 454.  Resolved.  Monitor.  Hypertension - Continue antihypertensives  Depression and anxiety - Continue Escitalopram  GERD - Continue Protonix  Generalized weakness - PT and OT recommend discharge to SNF.  However, patient refuses to go to SNF.  She plans to live with her daughter-in-law after discharge.  Obesity: Body mass index is 35.22 kg/m.  Complicates overall care and prognosis  DVT prophylaxis: enoxaparin (LOVENOX) injection 40 mg Start: 09/09/20 2200   Diet:  Diet Orders (From admission, onward)    Start     Ordered   09/19/20 0839  DIET SOFT Room service appropriate? Yes; Fluid consistency: Thin  Diet effective now       Question Answer Comment  Room service appropriate? Yes   Fluid consistency: Thin      09/19/20 0838            Code Status: DNR    Subjective 09/20/20    Patient awake eating breakfast when seen today.  She reports already feeling nauseous, and this happens every time she tries to eat.  Reports improved abdominal pain but says it still comes and goes.  No fever or chills.  States she  is dependent on ODT Zofran at home but it often does not help.      Disposition Plan & Communication   Status is: Inpatient  Remains inpatient appropriate because:Inpatient level of care appropriate due to severity of illness .  Patient has persistent nausea/vomiting after meals.  GI consulted today for further evaluation.   Dispo: The patient is from: Home              Anticipated d/c is to: Home with daughter-in-law and [facility]               Anticipated d/c date is: 1 day              Patient currently is not medically stable to d/c.   Family Communication: Spoke to daughter-in-law, Marilyn Andrews, by phone 11/18   Consults, Procedures, Significant Events   Consultants:   Gastroenterology  Procedures:   none  Antimicrobials:  Anti-infectives (From admission, onward)   Start     Dose/Rate Route Frequency Ordered Stop   09/16/20 1000  ciprofloxacin (CIPRO) tablet 500 mg        500 mg Oral 2 times daily 09/16/20 0757 09/18/20 1905   09/16/20 1000  metroNIDAZOLE (FLAGYL) tablet 500 mg        500 mg Oral Every 8 hours 09/16/20 0757 09/19/20 1359   09/12/20 1145  meropenem (MERREM) 1 g in sodium chloride 0.9 % 100 mL IVPB  Status:  Discontinued        1 g 200 mL/hr over 30 Minutes Intravenous Every 12 hours 09/12/20 1047 09/16/20 0757   09/12/20 1130  meropenem (MERREM) 1 g in sodium chloride 0.9 % 100 mL IVPB  Status:  Discontinued        1 g 200 mL/hr over 30 Minutes Intravenous Every 8 hours 09/12/20 1043 09/12/20 1047   09/11/20 1800  metroNIDAZOLE (FLAGYL) tablet 500 mg  Status:  Discontinued        500 mg Oral Every 8 hours 09/11/20 1133 09/12/20 1043   09/10/20 1100  cefTRIAXone (ROCEPHIN) 2 g in sodium chloride 0.9 % 100 mL IVPB  Status:  Discontinued        2 g 200 mL/hr over 30 Minutes Intravenous Every 24 hours 09/09/20 1247 09/12/20 1043   09/09/20 1800  metroNIDAZOLE (FLAGYL) IVPB 500 mg  Status:  Discontinued        500 mg 100 mL/hr over 60 Minutes Intravenous Every 8 hours 09/09/20 1247 09/11/20 1133   09/09/20 1130  cefTRIAXone (ROCEPHIN) 2 g in sodium chloride 0.9 % 100 mL IVPB        2 g 200 mL/hr over 30 Minutes Intravenous  Once 09/09/20 1120 09/09/20 1233   09/09/20 1130  metroNIDAZOLE (FLAGYL) IVPB 500 mg        500 mg 100 mL/hr over 60 Minutes Intravenous  Once 09/09/20 1120 09/09/20 1455         Objective   Vitals:   09/20/20 0550 09/20/20 1000 09/20/20 1100 09/20/20 1629  BP:  (!)  140/56 (!) 128/52 129/65  Pulse:  63 (!) 53 75  Resp:  20 16 16   Temp:  98.6 F (37 C) 98 F (36.7 C) 97.9 F (36.6 C)  TempSrc:  Oral Oral Oral  SpO2:  92% 94% 94%  Weight: 81.8 kg     Height:        Intake/Output Summary (Last 24 hours) at 09/20/2020 1732 Last data filed at 09/20/2020 1600 Gross per 24 hour  Intake  360 ml  Output 1900 ml  Net -1540 ml   Filed Weights   09/09/20 0851 09/18/20 1041 09/20/20 0550  Weight: 81.6 kg 81.8 kg 81.8 kg    Physical Exam:  General exam: awake, alert, no acute distress, obese Respiratory system: CTAB, normal respiratory effort, on room air. Cardiovascular system: normal S1/S2, RRR, no pedal edema.   Gastrointestinal system: soft, non-tender non-distended, positive bowel sounds    Labs   Data Reviewed: I have personally reviewed following labs and imaging studies  CBC: No results for input(s): WBC, NEUTROABS, HGB, HCT, MCV, PLT in the last 168 hours. Basic Metabolic Panel: Recent Labs  Lab 09/17/20 0645 09/19/20 0610 09/20/20 0415  NA 139 139 138  K 3.9 3.4* 3.9  CL 103 105 107  CO2 27 23 24   GLUCOSE 99 122* 101*  BUN 8 8 11   CREATININE 0.86 0.80 0.87  CALCIUM 8.5* 8.3* 8.2*  MG  --  2.2 2.3  PHOS  --   --  2.9   GFR: Estimated Creatinine Clearance: 47.2 mL/min (by C-G formula based on SCr of 0.87 mg/dL). Liver Function Tests: No results for input(s): AST, ALT, ALKPHOS, BILITOT, PROT, ALBUMIN in the last 168 hours. No results for input(s): LIPASE, AMYLASE in the last 168 hours. No results for input(s): AMMONIA in the last 168 hours. Coagulation Profile: No results for input(s): INR, PROTIME in the last 168 hours. Cardiac Enzymes: No results for input(s): CKTOTAL, CKMB, CKMBINDEX, TROPONINI in the last 168 hours. BNP (last 3 results) No results for input(s): PROBNP in the last 8760 hours. HbA1C: No results for input(s): HGBA1C in the last 72 hours. CBG: No results for input(s): GLUCAP in the last 168  hours. Lipid Profile: No results for input(s): CHOL, HDL, LDLCALC, TRIG, CHOLHDL, LDLDIRECT in the last 72 hours. Thyroid Function Tests: No results for input(s): TSH, T4TOTAL, FREET4, T3FREE, THYROIDAB in the last 72 hours. Anemia Panel: No results for input(s): VITAMINB12, FOLATE, FERRITIN, TIBC, IRON, RETICCTPCT in the last 72 hours. Sepsis Labs: No results for input(s): PROCALCITON, LATICACIDVEN in the last 168 hours.  No results found for this or any previous visit (from the past 240 hour(s)).    Imaging Studies   DG UGI W SINGLE CM (SOL OR THIN BA)  Result Date: 09/20/2020 CLINICAL DATA:  Nausea vomiting.  Early satiety. EXAM: UPPER GI SERIES WITHOUT KUB TECHNIQUE: Routine upper GI series was performed with thin barium. FLUOROSCOPY TIME:  Fluoroscopy Time:  2 minutes 36 seconds Radiation Exposure Index (if provided by the fluoroscopic device): 75.8 mGy COMPARISON:  CT 09/09/2020. FINDINGS: This is a limited exam due the patient's condition. Patient had also eaten today. Esophagus is widely patent. Debris noted the stomach most likely food debris. Stomach is widely patent. No fix gastric lesion identified. Duodenal bulb is normal. No evidence of ulceration. Duodenal C-loop is normal. Mild gastroesophageal reflux noted. IMPRESSION: Limited exam as above. No acute abnormality identified. No evidence of obstruction or ulceration. Mild gastroesophageal reflux. Electronically Signed   By: 09/22/2020  Register   On: 09/20/2020 16:16     Medications   Scheduled Meds: . amLODipine  5 mg Oral Daily  . enoxaparin (LOVENOX) injection  0.5 mg/kg Subcutaneous Q24H  . escitalopram  20 mg Oral Daily  . feeding supplement  237 mL Oral BID BM  . hydrocortisone cream   Topical BID  . lactulose  20 g Oral Daily  . metoCLOPramide  10 mg Oral TID AC & HS  . multivitamin  with minerals  1 tablet Oral Daily  . pantoprazole  40 mg Oral BID  . scopolamine  1 patch Transdermal Q72H  . sucralfate  1 g Oral  TID WC & HS   Continuous Infusions: . sodium chloride 20 mL (09/18/20 1243)       LOS: 10 days    Time spent: 30 minutes with >50% spent in coordination of care and direct patient contact.    Pennie Banter, DO [facility]  09/20/2020, 5:32 PM    If 7PM-7AM, please contact night-coverage. How to contact the [facility] Attending or Consulting provider 7A - 7P or covering provider during after hours 7P -7A, for this patient?    1. Check the care team in [facility] and look for a) attending/consulting [facility] provider listed and b) the [facility] team listed 2. Log into [email or web address] and use [facility] universal password to access. If you do not have the password, please contact the hospital operator. 3. Locate the [facility] provider you are looking for under [facility] and page to a number that you can be directly reached. 4. If you still have difficulty reaching the provider, please page the [facility] (Director on Call) for the Hospitalists listed on amion for assistance.

## 2020-09-20 NOTE — Progress Notes (Signed)
PT Cancellation Note  Patient Details Name: Marilyn Andrews MRN: [id number] DOB: [birth year]   Cancelled Treatment:    Reason Eval/Treat Not Completed: Pain limiting ability to participate   Pt offered and encouraged session but stated she was "sick."  Stated she took medicine but unable to participate right now.   Danielle Dess 09/20/2020, 11:19 AM

## 2020-09-20 NOTE — Plan of Care (Signed)

## 2020-09-21 MED ORDER — METHOCARBAMOL 500 MG PO TABS: 500.0000 mg | ORAL_TABLET | Freq: Four times a day (QID) | ORAL | 0 refills | Status: DC | PRN

## 2020-09-21 MED ORDER — SCOPOLAMINE 1 MG/3DAYS TD PT72: 1.0000 | MEDICATED_PATCH | TRANSDERMAL | 1 refills | Status: DC

## 2020-09-21 MED ORDER — ADULT MULTIVITAMIN W/MINERALS CH: 1.0000 | ORAL_TABLET | Freq: Every day | ORAL | Status: AC

## 2020-09-21 MED ORDER — DIPHENHYDRAMINE HCL 50 MG PO TABS: 50.0000 mg | ORAL_TABLET | Freq: Every evening | ORAL | 0 refills | Status: DC | PRN

## 2020-09-21 MED ORDER — ENSURE ENLIVE PO LIQD: 237.0000 mL | Freq: Two times a day (BID) | ORAL | 12 refills | Status: AC

## 2020-09-21 MED ORDER — HYDROCODONE-ACETAMINOPHEN 5-325 MG PO TABS: 2.0000 | ORAL_TABLET | Freq: Four times a day (QID) | ORAL | 0 refills | Status: DC | PRN

## 2020-09-21 MED ORDER — HYDROCODONE-ACETAMINOPHEN 5-325 MG PO TABS: 2.0000 | ORAL_TABLET | Freq: Four times a day (QID) | ORAL | 0 refills | Status: AC | PRN

## 2020-09-21 MED ORDER — PANTOPRAZOLE SODIUM 40 MG PO TBEC: 40.0000 mg | DELAYED_RELEASE_TABLET | Freq: Two times a day (BID) | ORAL | 1 refills | Status: AC

## 2020-09-21 MED ORDER — ONDANSETRON 8 MG PO TBDP: 8.0000 mg | ORAL_TABLET | Freq: Three times a day (TID) | ORAL | 0 refills | Status: DC | PRN

## 2020-09-21 MED ORDER — PANTOPRAZOLE SODIUM 40 MG PO TBEC: 40.0000 mg | DELAYED_RELEASE_TABLET | Freq: Two times a day (BID) | ORAL | 1 refills | Status: DC

## 2020-09-21 NOTE — TOC Transition Note (Signed)
Transition of Care [facility]) - CM/SW Discharge Note   Patient Details  Name: Marilyn Andrews MRN: [id number] Date of Birth: 1936/07/13  Transition of Care [facility] CM/SW Contact:  Eilleen Kempf, LCSW Phone Number: 09/21/2020, 3:54 PM   Clinical Narrative:    CSW notified patient discharging home to son/daughter-in-laws  House with [facility]. CSW confirmed with Barbara Cower they are aware of discharge. TOC signing off.         Patient Goals and CMS Choice        Discharge Placement                       Discharge Plan and Services                          HH Arranged: Refused SNF, RN, PT, OT, Nurse's Aide          Social Determinants of Health (SDOH) Interventions     Readmission Risk Interventions No flowsheet data found.

## 2020-09-21 NOTE — Progress Notes (Signed)
Discharge instructions and medication details reviewed with patient and daughter in law Olegario Messier).Printed AVS given  IV removed  Patient assisted with dressing and into the wheelchair. Patient escorted out via wheelchair to the car.

## 2020-09-21 NOTE — Progress Notes (Signed)
PT Cancellation Note  Patient Details Name: Marilyn Andrews MRN: [id number] DOB: 02/07/1936   Cancelled Treatment:     PT attempt, PT hold. Pt requesting pain medicine and nausea medication prior to PT. Will return later this date and continue to follow per POC. Pt has not been OOB in several days (per patient) and planning to DC home with [facility] services. Discussed possible need for STR at DC however pt unwilling. Author will return later this date after medicated.    Rushie Chestnut 09/21/2020, 9:17 AM

## 2020-09-21 NOTE — Progress Notes (Signed)
OT Cancellation Note  Patient Details Name: Marilyn Andrews MRN: [id number] DOB: 1936-04-15   Cancelled Treatment:    Reason Eval/Treat Not Completed: Patient declined, no reason specified. Upon entering the room, pt supine in bed with eyes closed and sleeping soundly. OT waking pt for therapeutic intervention and pt replies, " No, I'm sick." OT asking for further clarification on how pt was feeling and if she needed anything from nursing and pt just continues to reply, " I'm sick" before returning back to sleep. OT will follow up when next available.   [name], MS, OTR/L , CBIS ascom [phone]  09/21/20, 2:42 PM   09/21/2020, 2:40 PM

## 2020-09-21 NOTE — Progress Notes (Signed)
GI Inpatient Follow-up Note  Subjective:  Patient seen in follow-up for refractory nausea and vomiting. UGIS yesterday showed mild gastroesophageal reflux with no overt stricture, mass lesion, or ulceration. Daughter-in-law is present at bedside. Patient reports she is still very nauseous. No emesis episodes today. She was able to eat breakfast today with a piece of sausage and grits. Patient denies any abdominal pain. No new complaints.   Scheduled Inpatient Medications:  . amLODipine  5 mg Oral Daily  . enoxaparin (LOVENOX) injection  0.5 mg/kg Subcutaneous Q24H  . escitalopram  20 mg Oral Daily  . feeding supplement  237 mL Oral BID BM  . hydrocortisone cream   Topical BID  . lactulose  20 g Oral Daily  . metoCLOPramide  10 mg Oral TID AC & HS  . multivitamin with minerals  1 tablet Oral Daily  . pantoprazole  40 mg Oral BID  . scopolamine  1 patch Transdermal Q72H  . sucralfate  1 g Oral TID WC & HS    Continuous Inpatient Infusions:   . sodium chloride 20 mL (09/18/20 1243)    PRN Inpatient Medications:  sodium chloride, acetaminophen, docusate sodium, fluticasone, hydrALAZINE, HYDROmorphone (DILAUDID) injection, hydrOXYzine, LORazepam, ondansetron (ZOFRAN) IV, oxyCODONE-acetaminophen, promethazine  Review of Systems: Constitutional: Weight is stable.  Eyes: No changes in vision. ENT: No oral lesions, sore throat.  GI: see HPI.  Heme/Lymph: No easy bruising.  CV: No chest pain.  GU: No hematuria.  Integumentary: No rashes.  Neuro: No headaches.  Psych: No depression/anxiety.  Endocrine: No heat/cold intolerance.  Allergic/Immunologic: No urticaria.  Resp: No cough, SOB.  Musculoskeletal: No joint swelling.    Physical Examination: BP (!) 148/57 (BP Location: Right Arm)   Pulse 61   Temp 97.8 F (36.6 C) (Oral)   Resp 20   Ht 5' (1.524 m)   Wt 81.8 kg   SpO2 96%   BMI 35.22 kg/m  Gen: NAD, alert and oriented x 4 HEENT: PEERLA, EOMI, Neck: supple, no JVD or  thyromegaly Chest: CTA bilaterally, no wheezes, crackles, or other adventitious sounds CV: RRR, no m/g/c/r Abd: soft, NT, ND, +BS in all four quadrants; no HSM, guarding, ridigity, or rebound tenderness Ext: no edema, well perfused with 2+ pulses, Skin: no rash or lesions noted Lymph: no LAD  Data: Lab Results  Component Value Date   WBC 6.5 09/13/2020   HGB 13.0 09/13/2020   HCT 39.5 09/13/2020   MCV 91.4 09/13/2020   PLT 359 09/13/2020   No results for input(s): HGB in the last 168 hours. Lab Results  Component Value Date   NA 138 09/20/2020   K 3.9 09/20/2020   CL 107 09/20/2020   CO2 24 09/20/2020   BUN 11 09/20/2020   CREATININE 0.87 09/20/2020   Lab Results  Component Value Date   ALT 12 09/09/2020   AST 18 09/09/2020   ALKPHOS 83 09/09/2020   BILITOT 1.0 09/09/2020   No results for input(s): APTT, INR, PTT in the last 168 hours.  UGIS 09/20/2020: FINDINGS: This is a limited exam due the patient's condition. Patient had also eaten today. Esophagus is widely patent. Debris noted the stomach most likely food debris. Stomach is widely patent. No fix gastric lesion identified. Duodenal bulb is normal. No evidence of ulceration. Duodenal C-loop is normal. Mild gastroesophageal reflux noted.  IMPRESSION: Limited exam as above. No acute abnormality identified. No evidence of obstruction or ulceration. Mild gastroesophageal reflux.   Assessment/Plan:  84 y/o Caucasian female with  a PMH of HTN, depression, GERD, depression, anxiety, impaired hearing, recurrent diverticulitis admitted for several abdominal pain 11/08 and found to have uncomplicated sigmoid diverticulitis. GI consulted for evaluation and management of refractory nausea and vomiting despite use of oral and IV antiemetics  1. Refractory nausea and vomiting - UGIS showed mild gastroesophageal reflux with no overt stricture, ulceration, or mass lesion  2. Acute uncomplicated sigmoid diverticulitis -  s/p treatment with course of Ciprofloxacin and Flagyl. Abdominal pain has resolved.   3. Hypokalemia - resolved. Likely 2/2 GI losses.   4. Depression and anxiety - on escitalopram at home  5. GERD - on Protonix at home  6. Obesity - BMI 35  Recommendations:  - Reviewed UGIS results with patient and her daughter-in-law - Gastric emptying scan has been ordered to rule out gastroparesis. Reglan should be d/c'd prior to GES. - Continue anti-emetic regimen - Continue acid suppression with Protonix 40 mg PO BID - Reflux precautions discussed and advised. - No indication for EGD at present time - If GES is negative, consider CT head to rule out CNS abnormalities - Patient would benefit from outpatient follow-up with gastroenterology given the chronicity of her symptoms   Please call with questions or concerns.    Jacob Moores, PA-C [facility] Gastroenterology [phone] (Cell)

## 2020-09-21 NOTE — Discharge Summary (Signed)
Physician Discharge Summary  Marilyn Andrews IWL:[id number] DOB: 03-16-1936 DOA: 09/09/2020  PCP: [facility]  Admit date: 09/09/2020 Discharge date: 09/21/2020  Admitted From: home Disposition:  Home with daughter-in-law  Recommendations for Outpatient Follow-up:  1. Follow up with PCP in 1-2 weeks 2. Please obtain BMP/CBC in one week 3. Please follow up with GI in 1-2 weeks for refractory nausea  Home Health: PT, OT, Aide, SW  Equipment/Devices: None   Discharge Condition: Stable  CODE STATUS: Full  Diet recommendation: Heart Healthy / Gastroparesis diet    Discharge Diagnoses: Principal Problem:   Acute diverticulitis Active Problems:   Diverticulitis   HTN (hypertension)   GERD (gastroesophageal reflux disease)   Depression   Generalized weakness    Summary of HPI and Hospital Course:  Brief summary by Dr. Myriam Forehand: "Marilyn Andrews is a 84 y.o. female with medical history significant for hypertension, GERD, depression, anxiety, impaired hearing, diverticulitis, who presented to the hospital with severe abdominal pain.  Abdominal pain has been going on for almost 2 weeks but pain has progressively worsened.   She was admitted to the hospital for acute diverticulitis.  She was treated with empiric IV antibiotics, IV fluids, analgesics and antiemetics.  Her condition has slowly improved.  She was evaluated by PT and OT recommended further rehabilitation in the skilled nursing facility.  However, patient refused to go to SNF.  She prefers to go home with home health therapy."    Acute diverticulitis - POA.  Completed course of antibiotics with oral ciprofloxacin and Flagyl 11/17.  --Continue analgesia and antiemetics as needed.  --Advance diet to soft today --> very upset stomach / nausea after breakfast. --Will schedule PO Zofran for 30-60 minutes before meals --Scopolamine patch ordered per palliative care recommendation --Continue PRN IV antiemetics per  orders --Increase oral PPI to twice daily --Trial of Carafate before meals Per Dr. Myriam Forehand: "Maintaining patient on a regular diet consistently has been challenging because of intermittent nausea, abdominal pain and vomiting."  Refractory Nausea / Vomiting - long history of this, unclear if due to diverticulitis entirely as patient does not appear to have much increase in pain with oral intake, more nausea.  Chart reviewed and recurrent nausea appears to be chronic issue. Appears not seen by GI since 2017.   Consulted GI who recommended gastric emptying study to evaluate for gastroparesis.  This study unable to be done inpatient over weekend.  Pt has been able to tolerate her meals without vomiting per nursing.   --[facility] follow up at [facility]. --started on Benadryl qHS --Given printed information on gastroparesis diet  History of Gastritis & Duodenitis per chart review, was diagnosed on EGD back in 2004.   --Outpatient GI referral for further evaluation --Continue PPI  Hypokalemia - likely due to N/V, recurrent. K was replaced as needed during admission.  Outpatient BMP within a week.  Prolonged QTc interval - QTc was 509 on admission:Improved to 454. Resolved. .  Hypertension - Continue antihypertensives  Depression and anxiety - Continue Escitalopram  GERD - Continue Protonix  Generalized weakness - PT and OT recommend discharge to SNF. However, patient refuses to go to SNF. She is going to live with her daughter-in-law after discharge.  Obesity: Body mass index is 35.22 kg/m.  Complicates overall care and prognosis   Discharge Instructions   Discharge Instructions    Call MD for:  extreme fatigue   Complete by: As directed    Call MD for:  persistant  dizziness or light-headedness   Complete by: As directed    Call MD for:  severe uncontrolled pain   Complete by: As directed    Call MD for:  temperature >100.4   Complete by: As directed    Diet - low sodium  heart healthy   Complete by: As directed    Small, frequent meals.  Follow gastroparesis diet per handouts given.   Discharge instructions   Complete by: As directed    Please try to follow gastroparesis diet (printed information provided).   Follow with [facility] in 1-2 weeks for more evaluation into the cause of your chronic nausea.  Eating small, more frequent meals may help.  Stick with foods or beverages that cause the least symptoms.  It may help to keep a diary of symptoms and what you are eating to track if getting better or worse, and if any association with foods.  This will help GI in their evaluation.  We are unable to obtain the gastric emptying study in the hospital for several more days, and you are doing well overall clinically so further stay in the hospital at this time would not benefit you (would make overall weakness worse).   Face-to-face encounter (required for Medicare/Medicaid patients)   Complete by: As directed    I BERNARD AYIKU certify that this patient is under my care and that I, or a nurse practitioner or physician's assistant working with me, had a face-to-face encounter that meets the physician face-to-face encounter requirements with this patient on 09/17/2020. The encounter with the patient was in whole, or in part for the following medical condition(s) which is the primary reason for home health care (List medical condition): Generalized weakness   The encounter with the patient was in whole, or in part, for the following medical condition, which is the primary reason for home health care: Generalized weakness   I certify that, based on my findings, the following services are medically necessary home health services:  Nursing Physical therapy     Reason for Medically Necessary Home Health Services:  Therapy- Investment banker, operational, Patent examiner Skilled Nursing- Skilled Assessment/Observation     My clinical findings support the need  for the above services: Unable to leave home safely without assistance and/or assistive device   Further, I certify that my clinical findings support that this patient is homebound due to: Unable to leave home safely without assistance   Home Health   Complete by: As directed    To provide the following care/treatments:  PT OT RN Home Health Aide     Increase activity slowly   Complete by: As directed      Allergies as of 09/21/2020      Reactions   Penicillins Other (See Comments)   Reaction as a child, patient does not recall Has patient had a PCN reaction causing immediate rash, facial/tongue/throat swelling, SOB or lightheadedness with hypotension: unknown Has patient had a PCN reaction causing severe rash involving mucus membranes or skin necrosis: unknown Has patient had a PCN reaction that required hospitalization: unknown Has patient had a PCN reaction occurring within the last 10 years: No If all of the above answers are "NO", then may proceed with Cephalosporin use.   Sulfa Antibiotics Nausea Only      Medication List    STOP taking these medications   mirtazapine 15 MG disintegrating tablet Commonly known as: REMERON SOL-TAB   ranitidine 150 MG tablet Commonly known as: ZANTAC  TAKE these medications   acetaminophen 500 MG tablet Commonly known as: TYLENOL Take 1,000 mg by mouth every 6 (six) hours as needed for mild pain or moderate pain.   amLODipine 5 MG tablet Commonly known as: NORVASC Take 5 mg by mouth daily.   diphenhydrAMINE 50 MG tablet Commonly known as: BENADRYL Take 1 tablet (50 mg total) by mouth at bedtime as needed for itching.   docusate sodium 100 MG capsule Commonly known as: COLACE Take 200 mg by mouth 2 (two) times daily as needed for mild constipation.   escitalopram 20 MG tablet Commonly known as: LEXAPRO Take 1 tablet (20 mg total) by mouth daily.   feeding supplement Liqd Take 237 mLs by mouth 2 (two) times daily between  meals. Start taking on: September 22, 2020   fluticasone 50 MCG/ACT nasal spray Commonly known as: FLONASE Place 2 sprays into both nostrils 2 (two) times daily as needed for allergies or rhinitis.   HYDROcodone-acetaminophen 5-325 MG tablet Commonly known as: NORCO/VICODIN Take 2 tablets by mouth every 6 (six) hours as needed for up to 20 days for moderate pain or severe pain. What changed:   how much to take  reasons to take this   ibuprofen 200 MG tablet Commonly known as: ADVIL Take 800 mg by mouth every 6 (six) hours as needed for headache or moderate pain.   lactulose 20 g packet Commonly known as: CEPHULAC Take 1 packet (20 g total) by mouth daily as needed.   LORazepam 0.5 MG tablet Commonly known as: ATIVAN Take 0.5 mg by mouth 2 (two) times daily as needed.   methocarbamol 500 MG tablet Commonly known as: Robaxin Take 1 tablet (500 mg total) by mouth every 6 (six) hours as needed for muscle spasms.   multivitamin with minerals Tabs tablet Take 1 tablet by mouth daily. Start taking on: September 22, 2020   ondansetron 8 MG disintegrating tablet Commonly known as: Zofran ODT Take 1 tablet (8 mg total) by mouth every 8 (eight) hours as needed for nausea or vomiting. What changed:   medication strength  how much to take   pantoprazole 40 MG tablet Commonly known as: PROTONIX Take 1 tablet (40 mg total) by mouth 2 (two) times daily. What changed: when to take this   promethazine 25 MG tablet Commonly known as: PHENERGAN Take 12-25 mg by mouth every 6 (six) hours as needed for nausea or vomiting.   scopolamine 1 MG/3DAYS Commonly known as: TRANSDERM-SCOP Place 1 patch (1.5 mg total) onto the skin every 3 (three) days. Start taking on: September 22, 2020   senna-docusate 8.6-50 MG tablet Commonly known as: Senokot-S Take 2 tablets by mouth 2 (two) times daily as needed for mild constipation.   triamcinolone 0.1 % Commonly known as: KENALOG Apply 1  application topically daily as needed (rash).            Durable Medical Equipment  (From admission, onward)         Start     Ordered   09/17/20 1102  For home use only DME Bedside commode  Once       Question:  Patient needs a bedside commode to treat with the following condition  Answer:  Weakness   09/17/20 1102          Allergies  Allergen Reactions  . Penicillins Other (See Comments)    Reaction as a child, patient does not recall  Has patient had a PCN reaction causing immediate rash,  facial/tongue/throat swelling, SOB or lightheadedness with hypotension: unknown Has patient had a PCN reaction causing severe rash involving mucus membranes or skin necrosis: unknown Has patient had a PCN reaction that required hospitalization: unknown Has patient had a PCN reaction occurring within the last 10 years: No If all of the above answers are "NO", then may proceed with Cephalosporin use.  . Sulfa Antibiotics Nausea Only    Consultations:  Gastroenterology   Procedures/Studies: CT ABDOMEN PELVIS W CONTRAST  Result Date: 09/09/2020 CLINICAL DATA:  Abdominal pain and fever.  Dysuria. EXAM: CT ABDOMEN AND PELVIS WITH CONTRAST TECHNIQUE: Multidetector CT imaging of the abdomen and pelvis was performed using the standard protocol following bolus administration of intravenous contrast. CONTRAST:  OMNIPAQUE IOHEXOL 300 MG/ML  SOLN COMPARISON:  Mar 05, 2020 and July 28, 2018 FINDINGS: Lower chest: Stable scarring in the lung bases. No lung base edema or consolidation. Hepatobiliary: There are subcentimeter apparent cysts in the liver, stable. No evident new liver lesions. Gallbladder is absent. There is no appreciable intrahepatic biliary duct dilatation. Prominence of the common bile duct is again noted measuring 11 mm. Common hepatic duct measures 1.3 cm, slightly smaller compared to previous study. No biliary duct mass or calculus is demonstrable on this study. Pancreas:  No pancreatic mass or inflammatory focus. Spleen: No splenic lesions evident. A small accessory spleen anteriorly is stable. Adrenals/Urinary Tract: Adrenals bilaterally appear normal. There is no evident renal mass or hydronephrosis on either side. No evident renal or ureteral calculus on either side. Urinary bladder is midline. Urinary bladder wall is borderline thickened. Stomach/Bowel: There are multiple sigmoid diverticula. There is soft tissue stranding in the region of the proximal to mid sigmoid colon consistent with a degree of diverticulitis. There is no fluid collection or evidence of abscess in this area of sigmoid diverticulitis no perforation evident. Scattered diverticula noted in the descending colon without inflammation. Elsewhere, no bowel wall thickening or bowel obstruction. Terminal ileum appears normal. There is fatty infiltration in ileocecal valve. No evident free air or portal venous air. Vascular/Lymphatic: No abdominal aortic aneurysm. There is aortic and iliac artery atherosclerosis. Major venous structures appear patent. No evident adenopathy in the abdomen or pelvis. Reproductive: The uterus is absent.  No evident pelvic mass. Other: Appendix absent. No periappendiceal region inflammatory type change. No abscess or ascites is evident in the abdomen or pelvis. Slight fat in the umbilicus noted. Musculoskeletal: Degenerative change noted in the lumbar spine. There is a degree of spinal stenosis at L3-4 due to bony hypertrophy and disc protrusion. No intramuscular or abdominal wall lesions are evident. IMPRESSION: 1. Diverticulitis involving the proximal to mid sigmoid colon with the inflammatory changes noted in the upper left pelvis extending to the midline. No fluid collection or abscess within the area of diverticulitis. No evident perforation. Sigmoid diverticulosis noted more distally in the sigmoid colon as well as in the descending colon. 2. Borderline thickening of the urinary  bladder which could indicate a degree of cystitis. Correlation with urinalysis in this regard advised. 3. No bowel obstruction. No abscess in the abdomen or pelvis. Appendix absent. 4. No hydronephrosis on either side. No renal or ureteral calculus on either side. 5. Gallbladder absent. Biliary duct dilatation noted, slightly less than on prior study. No obstructing focus seen in the biliary ductal system by CT. 6. Spinal stenosis at L3-4 due to bony hypertrophy and disc protrusion. 7. Aortic Atherosclerosis (ICD10-I70.0). There also foci of iliac artery atherosclerotic calcification. 8.  Uterus absent.  Electronically Signed   By: Bretta Bang III M.D.   On: 09/09/2020 11:13   DG UGI W SINGLE CM (SOL OR THIN BA)  Result Date: 09/20/2020 CLINICAL DATA:  Nausea vomiting.  Early satiety. EXAM: UPPER GI SERIES WITHOUT KUB TECHNIQUE: Routine upper GI series was performed with thin barium. FLUOROSCOPY TIME:  Fluoroscopy Time:  2 minutes 36 seconds Radiation Exposure Index (if provided by the fluoroscopic device): 75.8 mGy COMPARISON:  CT 09/09/2020. FINDINGS: This is a limited exam due the patient's condition. Patient had also eaten today. Esophagus is widely patent. Debris noted the stomach most likely food debris. Stomach is widely patent. No fix gastric lesion identified. Duodenal bulb is normal. No evidence of ulceration. Duodenal C-loop is normal. Mild gastroesophageal reflux noted. IMPRESSION: Limited exam as above. No acute abnormality identified. No evidence of obstruction or ulceration. Mild gastroesophageal reflux. Electronically Signed   By: Maisie Fus  Register   On: 09/20/2020 16:16       Subjective: Pt seen this AM. Continues to feel nauseous with eating, feels full shortly after starting meal.  Nursing reports good and adequate consumption of meals.  Discussed with patient and daughter-in-law - test for gastroparesis unable to be done right now.  Agreeable to outpatient follow up and discharge  home today.   Discharge Exam: Vitals:   09/21/20 0918 09/21/20 1311  BP: (!) 148/57 (!) 123/47  Pulse: 61 66  Resp: 20 20  Temp: 97.8 F (36.6 C) 98.8 F (37.1 C)  SpO2: 96% 94%   Vitals:   09/20/20 2319 09/20/20 2320 09/21/20 0918 09/21/20 1311  BP: (!) 140/57  (!) 148/57 (!) 123/47  Pulse: (!) 53 65 61 66  Resp: 20  20 20   Temp: 98.5 F (36.9 C)  97.8 F (36.6 C) 98.8 F (37.1 C)  TempSrc: Oral  Oral   SpO2: 94% 94% 96% 94%  Weight:      Height:        General: Pt is alert, awake, not in acute distress, obese Cardiovascular: RRR, S1/S2 +, no rubs, no gallops Respiratory: CTA bilaterally, no wheezing, no rhonchi Abdominal: Soft, NT, ND, bowel sounds + Extremities: no edema, no cyanosis    The results of significant diagnostics from this hospitalization (including imaging, microbiology, ancillary and laboratory) are listed below for reference.     Microbiology: No results found for this or any previous visit (from the past 240 hour(s)).   Labs: BNP (last 3 results) No results for input(s): BNP in the last 8760 hours. Basic Metabolic Panel: Recent Labs  Lab 09/17/20 0645 09/19/20 0610 09/20/20 0415  NA 139 139 138  K 3.9 3.4* 3.9  CL 103 105 107  CO2 27 23 24   GLUCOSE 99 122* 101*  BUN 8 8 11   CREATININE 0.86 0.80 0.87  CALCIUM 8.5* 8.3* 8.2*  MG  --  2.2 2.3  PHOS  --   --  2.9   Liver Function Tests: No results for input(s): AST, ALT, ALKPHOS, BILITOT, PROT, ALBUMIN in the last 168 hours. No results for input(s): LIPASE, AMYLASE in the last 168 hours. No results for input(s): AMMONIA in the last 168 hours. CBC: No results for input(s): WBC, NEUTROABS, HGB, HCT, MCV, PLT in the last 168 hours. Cardiac Enzymes: No results for input(s): CKTOTAL, CKMB, CKMBINDEX, TROPONINI in the last 168 hours. BNP: Invalid input(s): POCBNP CBG: No results for input(s): GLUCAP in the last 168 hours. D-Dimer No results for input(s): DDIMER in the last 72  hours.  Hgb A1c No results for input(s): HGBA1C in the last 72 hours. Lipid Profile No results for input(s): CHOL, HDL, LDLCALC, TRIG, CHOLHDL, LDLDIRECT in the last 72 hours. Thyroid function studies No results for input(s): TSH, T4TOTAL, T3FREE, THYROIDAB in the last 72 hours.  Invalid input(s): FREET3 Anemia work up No results for input(s): VITAMINB12, FOLATE, FERRITIN, TIBC, IRON, RETICCTPCT in the last 72 hours. Urinalysis    Component Value Date/Time   COLORURINE YELLOW (A) 09/09/2020 1002   APPEARANCEUR CLEAR (A) 09/09/2020 1002   APPEARANCEUR Hazy 03/27/2013 1114   LABSPEC 1.012 09/09/2020 1002   LABSPEC 1.026 03/27/2013 1114   PHURINE 8.0 09/09/2020 1002   GLUCOSEU NEGATIVE 09/09/2020 1002   GLUCOSEU Negative 03/27/2013 1114   HGBUR NEGATIVE 09/09/2020 1002   BILIRUBINUR NEGATIVE 09/09/2020 1002   BILIRUBINUR Negative 03/27/2013 1114   KETONESUR NEGATIVE 09/09/2020 1002   PROTEINUR NEGATIVE 09/09/2020 1002   NITRITE NEGATIVE 09/09/2020 1002   LEUKOCYTESUR TRACE (A) 09/09/2020 1002   LEUKOCYTESUR Negative 03/27/2013 1114   Sepsis Labs Invalid input(s): PROCALCITONIN,  WBC,  LACTICIDVEN Microbiology No results found for this or any previous visit (from the past 240 hour(s)).   Time coordinating discharge: Over 30 minutes  SIGNED:   Pennie Banter, DO [facility] 09/21/2020, 4:17 PM   If 7PM-7AM, please contact night-coverage [email or web address]

## 2022-08-21 ENCOUNTER — Emergency Department
Admission: EM | Admit: 2022-08-21 | Discharge: 2022-08-22 | Disposition: A | Discharge status: Home / Self Care | Payer: Medicare HMO | Attending: Emergency Medicine | Admitting: Emergency Medicine

## 2022-08-21 ENCOUNTER — Emergency Department: Payer: Medicare HMO

## 2022-08-21 DIAGNOSIS — R112 Nausea with vomiting, unspecified: Secondary | ICD-10-CM | POA: Insufficient documentation

## 2022-08-21 DIAGNOSIS — N390 Urinary tract infection, site not specified: Secondary | ICD-10-CM | POA: Insufficient documentation

## 2022-08-21 DIAGNOSIS — R1084 Generalized abdominal pain: Secondary | ICD-10-CM

## 2022-08-21 DIAGNOSIS — Z20822 Contact with and (suspected) exposure to covid-19: Secondary | ICD-10-CM | POA: Diagnosis not present

## 2022-08-21 DIAGNOSIS — D72829 Elevated white blood cell count, unspecified: Secondary | ICD-10-CM | POA: Insufficient documentation

## 2022-08-21 LAB — RESP PANEL BY RT-PCR (FLU A&B, COVID) ARPGX2
Influenza A by PCR: NEGATIVE
Influenza B by PCR: NEGATIVE
SARS Coronavirus 2 by RT PCR: NEGATIVE

## 2022-08-21 LAB — CBC WITH DIFFERENTIAL/PLATELET
Abs Immature Granulocytes: 0.06 10*3/uL (ref 0.00–0.07)
Basophils Absolute: 0.1 10*3/uL (ref 0.0–0.1)
Basophils Relative: 0 %
Eosinophils Absolute: 0.1 10*3/uL (ref 0.0–0.5)
Eosinophils Relative: 0 %
HCT: 49.9 % — ABNORMAL HIGH (ref 36.0–46.0)
Hemoglobin: 16 g/dL — ABNORMAL HIGH (ref 12.0–15.0)
Immature Granulocytes: 0 %
Lymphocytes Relative: 8 %
Lymphs Abs: 1 10*3/uL (ref 0.7–4.0)
MCH: 28.5 pg (ref 26.0–34.0)
MCHC: 32.1 g/dL (ref 30.0–36.0)
MCV: 88.8 fL (ref 80.0–100.0)
Monocytes Absolute: 0.6 10*3/uL (ref 0.1–1.0)
Monocytes Relative: 5 %
Neutro Abs: 11.5 10*3/uL — ABNORMAL HIGH (ref 1.7–7.7)
Neutrophils Relative %: 87 %
Platelets: 367 10*3/uL (ref 150–400)
RBC: 5.62 MIL/uL — ABNORMAL HIGH (ref 3.87–5.11)
RDW: 14.1 % (ref 11.5–15.5)
WBC: 13.4 10*3/uL — ABNORMAL HIGH (ref 4.0–10.5)
nRBC: 0 % (ref 0.0–0.2)

## 2022-08-21 LAB — URINALYSIS, ROUTINE W REFLEX MICROSCOPIC
Bilirubin Urine: NEGATIVE
Glucose, UA: NEGATIVE mg/dL
Hgb urine dipstick: NEGATIVE
Ketones, ur: NEGATIVE mg/dL
Nitrite: NEGATIVE
Protein, ur: NEGATIVE mg/dL
Specific Gravity, Urine: 1.009 (ref 1.005–1.030)
pH: 7 (ref 5.0–8.0)

## 2022-08-21 LAB — COMPREHENSIVE METABOLIC PANEL
ALT: 16 U/L (ref 0–44)
AST: 19 U/L (ref 15–41)
Albumin: 3.6 g/dL (ref 3.5–5.0)
Alkaline Phosphatase: 84 U/L (ref 38–126)
Anion gap: 9 (ref 5–15)
BUN: 14 mg/dL (ref 8–23)
CO2: 23 mmol/L (ref 22–32)
Calcium: 8.6 mg/dL — ABNORMAL LOW (ref 8.9–10.3)
Chloride: 105 mmol/L (ref 98–111)
Creatinine, Ser: 1.01 mg/dL — ABNORMAL HIGH (ref 0.44–1.00)
GFR, Estimated: 55 mL/min — ABNORMAL LOW (ref >60)
Glucose, Bld: 110 mg/dL — ABNORMAL HIGH (ref 70–99)
Potassium: 3.8 mmol/L (ref 3.5–5.1)
Sodium: 137 mmol/L (ref 135–145)
Total Bilirubin: 1.4 mg/dL — ABNORMAL HIGH (ref 0.3–1.2)
Total Protein: 7 g/dL (ref 6.5–8.1)

## 2022-08-21 MED ORDER — ONDANSETRON 4 MG PO TBDP: 4.0000 mg | ORAL_TABLET | Freq: Three times a day (TID) | ORAL | 0 refills | Status: DC | PRN

## 2022-08-21 MED ORDER — CEPHALEXIN 500 MG PO CAPS: 500.0000 mg | ORAL_CAPSULE | Freq: Two times a day (BID) | ORAL | 0 refills | Status: AC

## 2022-08-21 MED ORDER — FENTANYL CITRATE PF 50 MCG/ML IJ SOSY
50.0000 ug | PREFILLED_SYRINGE | Freq: Once | INTRAMUSCULAR | Status: AC
  Administered 2022-08-21: 50 ug via INTRAVENOUS
  Filled 2022-08-21: qty 1

## 2022-08-21 MED ORDER — ONDANSETRON HCL 4 MG/2ML IJ SOLN
4.0000 mg | Freq: Once | INTRAMUSCULAR | Status: AC
  Administered 2022-08-21: 4 mg via INTRAVENOUS
  Filled 2022-08-21: qty 2

## 2022-08-21 MED ORDER — SODIUM CHLORIDE 0.9 % IV SOLN
1.0000 g | Freq: Once | INTRAVENOUS | Status: AC
  Administered 2022-08-21: 1 g via INTRAVENOUS
  Filled 2022-08-21: qty 10

## 2022-08-21 MED ORDER — CEPHALEXIN 500 MG PO CAPS: 500.0000 mg | ORAL_CAPSULE | Freq: Two times a day (BID) | ORAL | 0 refills | Status: DC

## 2022-08-21 MED ORDER — IOHEXOL 300 MG/ML  SOLN
100.0000 mL | Freq: Once | INTRAMUSCULAR | Status: AC | PRN
  Administered 2022-08-21: 100 mL via INTRAVENOUS

## 2022-08-21 MED ORDER — KETOROLAC TROMETHAMINE 15 MG/ML IJ SOLN
15.0000 mg | Freq: Once | INTRAMUSCULAR | Status: AC
  Administered 2022-08-21: 15 mg via INTRAVENOUS
  Filled 2022-08-21: qty 1

## 2022-08-21 NOTE — ED Provider Notes (Signed)
[facility] Provider Note    Event Date/Time   First MD Initiated Contact with Patient 08/21/22 1940     (approximate)   History   Abdominal Pain (Patient presents with generalized abdominal pain, nausea, vomiting, and "loose" stools; Reports a h/o diverticulitis and she says this pain feels similiar)   HPI  Marilyn Andrews is a 86 y.o. female who presents to the emergency department with abdominal pain.  Endorses 2 days of abdominal pain with diffuse abdominal pain that she feels like is worse in her left lower abdomen.  Does endorse urinary urgency and frequency.  Endorses nausea and vomiting.  Concerned that she had another episode of diverticulitis.  Denies any fever or chills.      Physical Exam   Triage Vital Signs: ED Triage Vitals  Enc Vitals Group     BP 08/21/22 1856 (!) 147/81     Pulse Rate 08/21/22 1856 73     Resp 08/21/22 1856 16     Temp 08/21/22 1856 98.3 F (36.8 C)     Temp Source 08/21/22 1856 Oral     SpO2 08/21/22 1856 95 %     Weight 08/21/22 1858 189 lb (85.7 kg)     Height 08/21/22 1858 5' (1.524 m)     Head Circumference --      Peak Flow --      Pain Score 08/21/22 1857 9     Pain Loc --      Pain Edu? --      Excl. in [place]? --     Most recent vital signs: Vitals:   08/21/22 1856 08/21/22 2130  BP: (!) 147/81 (!) 183/81  Pulse: 73 81  Resp: 16 18  Temp: 98.3 F (36.8 C) 98.6 F (37 C)  SpO2: 95% 95%    Physical Exam Constitutional:      Appearance: She is well-developed.  HENT:     Head: Atraumatic.  Eyes:     Conjunctiva/sclera: Conjunctivae normal.  Cardiovascular:     Rate and Rhythm: Regular rhythm.  Pulmonary:     Effort: No respiratory distress.  Abdominal:     General: There is no distension.     Tenderness: There is abdominal tenderness.  Musculoskeletal:        General: Normal range of motion.     Cervical back: Normal range of motion.  Skin:    General: Skin is warm.  Neurological:      Mental Status: She is alert. Mental status is at baseline.          IMPRESSION / MDM / ASSESSMENT AND PLAN / ED COURSE  I reviewed the triage vital signs and the nursing notes.  Differential diagnosis including diverticulitis, intra-abdominal abscess, pyelonephritis, appendicitis  Patient was given IV fluids, IV fentanyl, IV Zofran   EKG  No tachycardic or bradycardic dysrhythmias while on cardiac telemetry RADIOLOGY I independently reviewed imaging, my interpretation of imaging: CT scan with no signs of acute appendicitis.  No signs of acute diverticulitis and no abscess.  Read as diverticulosis but no signs of acute diverticulitis.    ED Results / Procedures / Treatments   Labs (all labs ordered are listed, but only abnormal results are displayed) Labs interpreted as -   Lab work with mild leukocytosis.  Creatinine at baseline.  No significant electrolyte abnormalities.  UA concerning for possible urinary tract infection.  Urine culture was added on.  On review of external notes and records, no  history of a resistant urinary tract infection  Labs Reviewed  COMPREHENSIVE METABOLIC PANEL - Abnormal; Notable for the following components:      Result Value   Glucose, Bld 110 (*)    Creatinine, Ser 1.01 (*)    Calcium 8.6 (*)    Total Bilirubin 1.4 (*)    GFR, Estimated 55 (*)    All other components within normal limits  CBC WITH DIFFERENTIAL/PLATELET - Abnormal; Notable for the following components:   WBC 13.4 (*)    RBC 5.62 (*)    Hemoglobin 16.0 (*)    HCT 49.9 (*)    Neutro Abs 11.5 (*)    All other components within normal limits  URINALYSIS, ROUTINE W REFLEX MICROSCOPIC - Abnormal; Notable for the following components:   Color, Urine YELLOW (*)    APPearance HAZY (*)    Leukocytes,Ua LARGE (*)    Bacteria, UA RARE (*)    All other components within normal limits  RESP PANEL BY RT-PCR (FLU A&B, COVID) ARPGX2  URINE CULTURE    Given an IV dose of  Rocephin to cover for urinary tract infection.  On reevaluation patient was able to tolerate p.o.  We will start the patient on antiemetics and antibiotics.  Given return precautions for any worsening symptoms.  Discussed close follow-up with primary care physician.   PROCEDURES:  Critical Care performed: No  Procedures  Patient's presentation is most consistent with acute presentation with potential threat to life or bodily function.   MEDICATIONS ORDERED IN ED: Medications  ondansetron [facility]) injection 4 mg (4 mg Intravenous Given 08/21/22 2033)  fentaNYL (SUBLIMAZE) injection 50 mcg (50 mcg Intravenous Given 08/21/22 2033)  iohexol (OMNIPAQUE) 300 MG/ML solution 100 mL (100 mLs Intravenous Contrast Given 08/21/22 2043)  cefTRIAXone (ROCEPHIN) 1 g in sodium chloride 0.9 % 100 mL IVPB (0 g Intravenous Stopped 08/21/22 2222)  ketorolac (TORADOL) 15 MG/ML injection 15 mg (15 mg Intravenous Given 08/21/22 2221)  ondansetron (ZOFRAN) injection 4 mg (4 mg Intravenous Given 08/21/22 2219)    FINAL CLINICAL IMPRESSION(S) / ED DIAGNOSES   Final diagnoses:  Generalized abdominal pain  Urinary tract infection without hematuria, site unspecified  Nausea and vomiting, unspecified vomiting type     Rx / DC Orders   ED Discharge Orders          Ordered    cephALEXin (KEFLEX) 500 MG capsule  2 times daily,   Status:  Discontinued        08/21/22 2325    ondansetron (ZOFRAN-ODT) 4 MG disintegrating tablet  Every 8 hours PRN,   Status:  Discontinued        08/21/22 2325    cephALEXin (KEFLEX) 500 MG capsule  2 times daily        08/21/22 2356    ondansetron (ZOFRAN-ODT) 4 MG disintegrating tablet  Every 8 hours PRN        08/21/22 2356             Note:  This document was prepared using Dragon voice recognition software and may include unintentional dictation errors.   Corena Herter, MD 08/22/22 0004

## 2022-08-21 NOTE — ED Provider Notes (Incomplete)
Remote any over-the-counter cough meds  [facility] Provider Note    Event Date/Time   First MD Initiated Contact with Patient 08/21/22 1940     (approximate)   History   Abdominal Pain (Patient presents with generalized abdominal pain, nausea, vomiting, and "loose" stools; Reports a h/o diverticulitis and she says this pain feels similiar)   HPI  Marilyn Andrews is a 86 y.o. female  ***       Physical Exam   Triage Vital Signs: ED Triage Vitals  Enc Vitals Group     BP 08/21/22 1856 (!) 147/81     Pulse Rate 08/21/22 1856 73     Resp 08/21/22 1856 16     Temp 08/21/22 1856 98.3 F (36.8 C)     Temp Source 08/21/22 1856 Oral     SpO2 08/21/22 1856 95 %     Weight 08/21/22 1858 189 lb (85.7 kg)     Height 08/21/22 1858 5' (1.524 m)     Head Circumference --      Peak Flow --      Pain Score 08/21/22 1857 9     Pain Loc --      Pain Edu? --      Excl. in [place]? --     Most recent vital signs: Vitals:   08/21/22 1856 08/21/22 2130  BP: (!) 147/81 (!) 183/81  Pulse: 73 81  Resp: 16 18  Temp: 98.3 F (36.8 C) 98.6 F (37 C)  SpO2: 95% 95%    Physical Exam       IMPRESSION / MDM / ASSESSMENT AND PLAN / ED COURSE  I reviewed the triage vital signs and the nursing notes.  ***     EKG  RADIOLOGY [My interpretation of imaging: ***]    ED Results / Procedures / Treatments   Labs (all labs ordered are listed, but only abnormal results are displayed) Labs interpreted as -    Labs Reviewed  COMPREHENSIVE METABOLIC PANEL - Abnormal; Notable for the following components:      Result Value   Glucose, Bld 110 (*)    Creatinine, Ser 1.01 (*)    Calcium 8.6 (*)    Total Bilirubin 1.4 (*)    GFR, Estimated 55 (*)    All other components within normal limits  CBC WITH DIFFERENTIAL/PLATELET - Abnormal; Notable for the following components:   WBC 13.4 (*)    RBC 5.62 (*)    Hemoglobin 16.0 (*)    HCT 49.9 (*)    Neutro Abs 11.5  (*)    All other components within normal limits  URINALYSIS, ROUTINE W REFLEX MICROSCOPIC - Abnormal; Notable for the following components:   Color, Urine YELLOW (*)    APPearance HAZY (*)    Leukocytes,Ua LARGE (*)    Bacteria, UA RARE (*)    All other components within normal limits  RESP PANEL BY RT-PCR (FLU A&B, COVID) ARPGX2  URINE CULTURE        PROCEDURES:  Critical Care performed: No  Procedures  Patient's presentation is most consistent with {EM COPA:27473}   MEDICATIONS ORDERED IN ED: Medications  cefTRIAXone (ROCEPHIN) 1 g in sodium chloride 0.9 % 100 mL IVPB (1 g Intravenous New Bag/Given 08/21/22 2126)  ondansetron (ZOFRAN) injection 4 mg (4 mg Intravenous Given 08/21/22 2033)  fentaNYL (SUBLIMAZE) injection 50 mcg (50 mcg Intravenous Given 08/21/22 2033)  iohexol (OMNIPAQUE) 300 MG/ML solution 100 mL (100 mLs Intravenous Contrast Given 08/21/22  2043)    FINAL CLINICAL IMPRESSION(S) / ED DIAGNOSES   Final diagnoses:  None     Rx / DC Orders   ED Discharge Orders     None        Note:  This document was prepared using Dragon voice recognition software and may include unintentional dictation errors.

## 2022-08-21 NOTE — ED Triage Notes (Signed)
Patient presents with generalized abdominal pain, nausea, vomiting, and "loose" stools; Reports a h/o diverticulitis and she says this pain feels similiar

## 2022-08-21 NOTE — ED Provider Triage Note (Signed)
Emergency Medicine Provider Triage Evaluation Note  Marilyn Andrews , a 86 y.o. female  was evaluated in triage.  Pt complains of diffuse abdominal pain..  Review of Systems  Positive: Abdominal pain Negative:   Physical Exam  There were no vitals taken for this visit. Gen:   Awake, no distress   Resp:  Normal effort  MSK:   Moves extremities without difficulty  Other:  Abdomen tender all 4 quads  Medical Decision Making  Medically screening exam initiated at 6:56 PM.  Appropriate orders placed.  Thalia O Xiong was informed that the remainder of the evaluation will be completed by another provider, this initial triage assessment does not replace that evaluation, and the importance of remaining in the ED until their evaluation is complete.     Versie Starks, PA-C 08/21/22 1857

## 2022-08-23 LAB — URINE CULTURE

## 2023-02-08 ENCOUNTER — Other Ambulatory Visit: Payer: Self-pay | Admitting: Internal Medicine

## 2023-02-08 ENCOUNTER — Other Ambulatory Visit: Payer: Self-pay

## 2023-02-08 ENCOUNTER — Inpatient Hospital Stay
Admission: EM | Admit: 2023-02-08 | Discharge: 2023-02-18 | DRG: 392 | Disposition: A | Discharge status: Skilled Nursing Facility | Payer: Medicare HMO | Attending: Internal Medicine | Admitting: Internal Medicine

## 2023-02-08 ENCOUNTER — Emergency Department: Payer: Medicare HMO

## 2023-02-08 DIAGNOSIS — K5732 Diverticulitis of large intestine without perforation or abscess without bleeding: Secondary | ICD-10-CM | POA: Diagnosis not present

## 2023-02-08 DIAGNOSIS — Z79899 Other long term (current) drug therapy: Secondary | ICD-10-CM

## 2023-02-08 DIAGNOSIS — G8929 Other chronic pain: Secondary | ICD-10-CM | POA: Diagnosis present

## 2023-02-08 DIAGNOSIS — R52 Pain, unspecified: Secondary | ICD-10-CM | POA: Insufficient documentation

## 2023-02-08 DIAGNOSIS — F32A Depression, unspecified: Secondary | ICD-10-CM | POA: Diagnosis present

## 2023-02-08 DIAGNOSIS — Z882 Allergy status to sulfonamides status: Secondary | ICD-10-CM

## 2023-02-08 DIAGNOSIS — D751 Secondary polycythemia: Secondary | ICD-10-CM | POA: Diagnosis present

## 2023-02-08 DIAGNOSIS — M170 Bilateral primary osteoarthritis of knee: Secondary | ICD-10-CM | POA: Diagnosis present

## 2023-02-08 DIAGNOSIS — R531 Weakness: Secondary | ICD-10-CM | POA: Diagnosis present

## 2023-02-08 DIAGNOSIS — Z751 Person awaiting admission to adequate facility elsewhere: Secondary | ICD-10-CM

## 2023-02-08 DIAGNOSIS — Z1152 Encounter for screening for COVID-19: Secondary | ICD-10-CM

## 2023-02-08 DIAGNOSIS — Z8249 Family history of ischemic heart disease and other diseases of the circulatory system: Secondary | ICD-10-CM

## 2023-02-08 DIAGNOSIS — F419 Anxiety disorder, unspecified: Secondary | ICD-10-CM | POA: Diagnosis present

## 2023-02-08 DIAGNOSIS — I10 Essential (primary) hypertension: Secondary | ICD-10-CM | POA: Diagnosis present

## 2023-02-08 DIAGNOSIS — K5792 Diverticulitis of intestine, part unspecified, without perforation or abscess without bleeding: Secondary | ICD-10-CM | POA: Diagnosis not present

## 2023-02-08 DIAGNOSIS — Z791 Long term (current) use of non-steroidal anti-inflammatories (NSAID): Secondary | ICD-10-CM

## 2023-02-08 DIAGNOSIS — M7989 Other specified soft tissue disorders: Secondary | ICD-10-CM | POA: Diagnosis present

## 2023-02-08 DIAGNOSIS — K219 Gastro-esophageal reflux disease without esophagitis: Secondary | ICD-10-CM | POA: Diagnosis present

## 2023-02-08 DIAGNOSIS — Z880 Allergy status to penicillin: Secondary | ICD-10-CM

## 2023-02-08 DIAGNOSIS — E876 Hypokalemia: Secondary | ICD-10-CM | POA: Diagnosis present

## 2023-02-08 DIAGNOSIS — M545 Low back pain, unspecified: Secondary | ICD-10-CM | POA: Diagnosis present

## 2023-02-08 DIAGNOSIS — R112 Nausea with vomiting, unspecified: Secondary | ICD-10-CM | POA: Diagnosis present

## 2023-02-08 LAB — COMPREHENSIVE METABOLIC PANEL
ALT: 13 U/L (ref 0–44)
AST: 23 U/L (ref 15–41)
Albumin: 3 g/dL — ABNORMAL LOW (ref 3.5–5.0)
Alkaline Phosphatase: 70 U/L (ref 38–126)
Anion gap: 13 (ref 5–15)
BUN: 10 mg/dL (ref 8–23)
CO2: 26 mmol/L (ref 22–32)
Calcium: 8.6 mg/dL — ABNORMAL LOW (ref 8.9–10.3)
Chloride: 101 mmol/L (ref 98–111)
Creatinine, Ser: 0.86 mg/dL (ref 0.44–1.00)
GFR, Estimated: >60 mL/min (ref >60)
Glucose, Bld: 103 mg/dL — ABNORMAL HIGH (ref 70–99)
Potassium: 3.3 mmol/L — ABNORMAL LOW (ref 3.5–5.1)
Sodium: 140 mmol/L (ref 135–145)
Total Bilirubin: 0.9 mg/dL (ref 0.3–1.2)
Total Protein: 6.8 g/dL (ref 6.5–8.1)

## 2023-02-08 LAB — CBC
HCT: 46.7 % — ABNORMAL HIGH (ref 36.0–46.0)
Hemoglobin: 15.7 g/dL — ABNORMAL HIGH (ref 12.0–15.0)
MCH: 28.5 pg (ref 26.0–34.0)
MCHC: 33.6 g/dL (ref 30.0–36.0)
MCV: 84.9 fL (ref 80.0–100.0)
Platelets: 416 10*3/uL — ABNORMAL HIGH (ref 150–400)
RBC: 5.5 MIL/uL — ABNORMAL HIGH (ref 3.87–5.11)
RDW: 13.5 % (ref 11.5–15.5)
WBC: 9 10*3/uL (ref 4.0–10.5)
nRBC: 0 % (ref 0.0–0.2)

## 2023-02-08 LAB — LIPASE, BLOOD: Lipase: 24 U/L (ref 11–51)

## 2023-02-08 MED ORDER — SODIUM CHLORIDE 0.9 % IV SOLN
6.2500 mg | Freq: Four times a day (QID) | INTRAVENOUS | Status: DC | PRN
  Administered 2023-02-09 (×2): 6.25 mg via INTRAVENOUS
  Filled 2023-02-08 (×2): qty 0.25

## 2023-02-08 MED ORDER — PANTOPRAZOLE SODIUM 40 MG PO TBEC
40.0000 mg | DELAYED_RELEASE_TABLET | Freq: Two times a day (BID) | ORAL | Status: DC
  Administered 2023-02-09 – 2023-02-18 (×19): 40 mg via ORAL
  Filled 2023-02-08 (×19): qty 1

## 2023-02-08 MED ORDER — SODIUM CHLORIDE 0.9 % IV SOLN
2.0000 g | Freq: Once | INTRAVENOUS | Status: AC
  Administered 2023-02-08: 2 g via INTRAVENOUS
  Filled 2023-02-08: qty 20

## 2023-02-08 MED ORDER — SODIUM CHLORIDE 0.9% FLUSH
3.0000 mL | Freq: Two times a day (BID) | INTRAVENOUS | Status: DC
  Administered 2023-02-09 – 2023-02-18 (×20): 3 mL via INTRAVENOUS

## 2023-02-08 MED ORDER — MORPHINE SULFATE (PF) 2 MG/ML IV SOLN
2.0000 mg | Freq: Once | INTRAVENOUS | Status: AC
  Administered 2023-02-08: 2 mg via INTRAVENOUS
  Filled 2023-02-08: qty 1

## 2023-02-08 MED ORDER — IOHEXOL 300 MG/ML  SOLN
100.0000 mL | Freq: Once | INTRAMUSCULAR | Status: AC | PRN
  Administered 2023-02-08: 100 mL via INTRAVENOUS

## 2023-02-08 MED ORDER — ONDANSETRON HCL 4 MG/2ML IJ SOLN
4.0000 mg | Freq: Once | INTRAMUSCULAR | Status: AC
  Administered 2023-02-08: 4 mg via INTRAVENOUS
  Filled 2023-02-08: qty 2

## 2023-02-08 MED ORDER — MORPHINE SULFATE (PF) 4 MG/ML IV SOLN
4.0000 mg | Freq: Once | INTRAVENOUS | Status: AC
  Administered 2023-02-08: 4 mg via INTRAVENOUS
  Filled 2023-02-08: qty 1

## 2023-02-08 MED ORDER — ACETAMINOPHEN 325 MG PO TABS
650.0000 mg | ORAL_TABLET | ORAL | Status: DC | PRN
  Administered 2023-02-12 – 2023-02-18 (×3): 650 mg via ORAL
  Filled 2023-02-08 (×3): qty 2

## 2023-02-08 MED ORDER — OXYCODONE HCL 5 MG PO TABS
5.0000 mg | ORAL_TABLET | ORAL | Status: DC | PRN
  Administered 2023-02-09 – 2023-02-18 (×17): 5 mg via ORAL
  Filled 2023-02-08 (×17): qty 1

## 2023-02-08 MED ORDER — LACTULOSE 10 GM/15ML PO SOLN
20.0000 g | Freq: Every day | ORAL | Status: DC | PRN
  Filled 2023-02-08: qty 30

## 2023-02-08 MED ORDER — ONDANSETRON HCL 4 MG/2ML IJ SOLN
4.0000 mg | Freq: Four times a day (QID) | INTRAMUSCULAR | Status: DC | PRN
  Administered 2023-02-09 – 2023-02-18 (×15): 4 mg via INTRAVENOUS
  Filled 2023-02-08 (×15): qty 2

## 2023-02-08 MED ORDER — ROSUVASTATIN CALCIUM 10 MG PO TABS
20.0000 mg | ORAL_TABLET | Freq: Every day | ORAL | Status: DC
  Administered 2023-02-09 – 2023-02-17 (×9): 20 mg via ORAL
  Filled 2023-02-08 (×9): qty 2

## 2023-02-08 MED ORDER — CELECOXIB 200 MG PO CAPS
200.0000 mg | ORAL_CAPSULE | Freq: Two times a day (BID) | ORAL | Status: DC
  Administered 2023-02-09 – 2023-02-12 (×4): 200 mg via ORAL
  Filled 2023-02-08 (×10): qty 1

## 2023-02-08 MED ORDER — METRONIDAZOLE 500 MG/100ML IV SOLN: 500.0000 mg | Freq: Once | INTRAVENOUS | Status: DC

## 2023-02-08 MED ORDER — SODIUM CHLORIDE 0.9 % IV SOLN
1.0000 g | INTRAVENOUS | Status: DC
  Administered 2023-02-09 – 2023-02-11 (×3): 1 g via INTRAVENOUS
  Filled 2023-02-08 (×2): qty 10
  Filled 2023-02-08: qty 1

## 2023-02-08 MED ORDER — METRONIDAZOLE 500 MG/100ML IV SOLN
500.0000 mg | Freq: Two times a day (BID) | INTRAVENOUS | Status: DC
  Administered 2023-02-09 – 2023-02-11 (×7): 500 mg via INTRAVENOUS
  Filled 2023-02-08 (×8): qty 100

## 2023-02-08 MED ORDER — POTASSIUM CHLORIDE 20 MEQ PO PACK
40.0000 meq | PACK | Freq: Once | ORAL | Status: AC
  Administered 2023-02-09: 40 meq via ORAL
  Filled 2023-02-08: qty 2

## 2023-02-08 MED ORDER — ENOXAPARIN SODIUM 40 MG/0.4ML IJ SOSY
40.0000 mg | PREFILLED_SYRINGE | INTRAMUSCULAR | Status: DC
  Administered 2023-02-09 – 2023-02-17 (×9): 40 mg via SUBCUTANEOUS
  Filled 2023-02-08 (×9): qty 0.4

## 2023-02-08 MED ORDER — DEXTROSE IN LACTATED RINGERS 5 % IV SOLN: INTRAVENOUS | Status: DC

## 2023-02-08 NOTE — Assessment & Plan Note (Addendum)
Patient reports chronic low back and bilateral knee pain due to arthritis.  Also Acute abdominal pain due to diverticulitis.  On tramadol 50 my once daily at home. -- Continue tramadol 50 mg BID for now -- Resumed muscle relaxers --Mobic was held during admission, resume at d/c --Acetaminophen for mild pain --Oxycodone for breakthrough pain despite above --Follow up with outpatient pain provider

## 2023-02-08 NOTE — Assessment & Plan Note (Signed)
Mild erythrocytosis.  Noted to be chronic, I will send an erythropoietin level.  I will send an outpatient hematology referral

## 2023-02-08 NOTE — ED Triage Notes (Addendum)
Pt to ED via [facility] from home. Pt reports severe lower abdominal pain that radiates to her back x1 wk. Pt has been med compliant. Pt reports multiple episodes of emesis today. Pt states pain 10/10 upon palpitation.   EMS VS:  BP 146/94 HR 100 98% RA

## 2023-02-08 NOTE — Assessment & Plan Note (Signed)
Uncomplicated, however associated with significant nausea and vomiting and inability to tolerate p.o. diet.  Therefore at this time we will treat with intravenous ceftriaxone and metrondiazole.  IV fluids, see pain control below.  We can trial clear liquids and proceed as tolerated

## 2023-02-08 NOTE — ED Provider Notes (Signed)
[facility] Provider Note  Patient Contact: 6:07 PM (approximate)   History   Abdominal Pain   HPI  Marilyn Andrews is a 87 y.o. female with a history of hypertension, depression and diverticulitis, presents to the emergency department with a 1 week history of generalized abdominal pain.  Patient reports that her pain is mostly in the left lower quadrant but she also has pain that radiates into the upper abdomen as well.  Patient states that she has been trying to take tramadol and muscle relaxers but her symptoms have not improved.  She states that it is been approximately 1 week since she has had a bowel movement.  She denies fever and chills.  No associated diarrhea.  No chest pain, chest tightness or shortness of breath.  She denies starting any new medications.  She denies dysuria or increased urinary frequency.      Physical Exam   Triage Vital Signs: ED Triage Vitals  Enc Vitals Group     BP 02/08/23 1634 (!) 165/98     Pulse Rate 02/08/23 1634 94     Resp 02/08/23 1634 20     Temp 02/08/23 1634 98.6 F (37 C)     Temp Source 02/08/23 1634 Oral     SpO2 02/08/23 1635 93 %     Weight --      Height --      Head Circumference --      Peak Flow --      Pain Score 02/08/23 1632 7     Pain Loc --      Pain Edu? --      Excl. in [place]? --     Most recent vital signs: Vitals:   02/08/23 1635 02/08/23 2051  BP:  (!) 160/66  Pulse:  77  Resp:  20  Temp:    SpO2: 93% 93%     General: Alert and in no acute distress. Eyes:  PERRL. EOMI. Head: No acute traumatic findings ENT:      Nose: No congestion/rhinnorhea.      Mouth/Throat: Mucous membranes are moist.  Neck: No stridor. No cervical spine tenderness to palpation. Cardiovascular:  Good peripheral perfusion Respiratory: Normal respiratory effort without tachypnea or retractions. Lungs CTAB. Good air entry to the bases with no decreased or absent breath sounds. Gastrointestinal: Bowel sounds  4 quadrants. Soft and nontender to palpation. No guarding or rigidity. No palpable masses. No distention. No CVA tenderness. Musculoskeletal: Full range of motion to all extremities.  Neurologic:  No gross focal neurologic deficits are appreciated.  Skin:   No rash noted    ED Results / Procedures / Treatments   Labs (all labs ordered are listed, but only abnormal results are displayed) Labs Reviewed  COMPREHENSIVE METABOLIC PANEL - Abnormal; Notable for the following components:      Result Value   Potassium 3.3 (*)    Glucose, Bld 103 (*)    Calcium 8.6 (*)    Albumin 3.0 (*)    All other components within normal limits  CBC - Abnormal; Notable for the following components:   RBC 5.50 (*)    Hemoglobin 15.7 (*)    HCT 46.7 (*)    Platelets 416 (*)    All other components within normal limits  LIPASE, BLOOD  URINALYSIS, ROUTINE W REFLEX MICROSCOPIC  ERYTHROPOIETIN        RADIOLOGY  I personally viewed and evaluated these images as part of my medical decision  making, as well as reviewing the written report by the radiologist.  ED Provider Interpretation: Uncomplicated diverticulitis   PROCEDURES:  Critical Care performed: No  Procedures   MEDICATIONS ORDERED IN ED: Medications  cefTRIAXone (ROCEPHIN) 2 g in sodium chloride 0.9 % 100 mL IVPB (2 g Intravenous New Bag/Given 02/08/23 2233)  metroNIDAZOLE (FLAGYL) IVPB 500 mg (has no administration in time range)  promethazine (PHENERGAN) 6.25 mg in sodium chloride 0.9 % 50 mL IVPB (has no administration in time range)  potassium chloride (KLOR-CON) packet 40 mEq (has no administration in time range)  dextrose 5 % in lactated ringers infusion (has no administration in time range)  morphine (PF) 4 MG/ML injection 4 mg (4 mg Intravenous Given 02/08/23 1859)  ondansetron (ZOFRAN) injection 4 mg (4 mg Intravenous Given 02/08/23 1858)  iohexol (OMNIPAQUE) 300 MG/ML solution 100 mL (100 mLs Intravenous Contrast Given 02/08/23  2009)  morphine (PF) 2 MG/ML injection 2 mg (2 mg Intravenous Given 02/08/23 2030)     IMPRESSION / MDM / ASSESSMENT AND PLAN / ED COURSE  I reviewed the triage vital signs and the nursing notes.                              Assessment and plan: Abdominal pain:   87 year old female presents to the emergency department with worsening abdominal pain over the past week with associated nausea and vomiting.  Patient was hypertensive at triage but vital signs were otherwise reassuring.  On exam, abdomen was tender in the left lower quadrant.  No guarding.  Patient also endorsed nausea.  CBC and CMP largely reassuring.  Lipase within range.  CT abdomen pelvis indicates uncomplicated diverticulitis.  Patient was given morphine for pain and Zofran for nausea.  She was given 2 g of IV Rocephin and Flagyl.  Patient was accepted for admission under the care of Dr. Charmayne Sheer.    FINAL CLINICAL IMPRESSION(S) / ED DIAGNOSES   Final diagnoses:  Diverticulitis     Rx / DC Orders   ED Discharge Orders     None        Note:  This document was prepared using Dragon voice recognition software and may include unintentional dictation errors.   Pia Mau Bronson, PA-C 02/08/23 1518    Corena Herter, MD 02/09/23 [phone]

## 2023-02-08 NOTE — H&P (Signed)
History and Physical    Patient: Marilyn Andrews ZOX:[id number] DOB: [birth year] DOA: 02/08/2023 DOS: the patient was seen and examined on 02/08/2023 PCP: [facility]  Patient coming from: Home  Chief Complaint:  Chief Complaint  Patient presents with   Abdominal Pain   HPI: Marilyn Millerseggy O Herrig is a 87 y.o. female with medical history significant of prior episodes of diverticulitis.  Patient was in her usual state of health till about 5 days ago when she reports a new onset of constant aching pain in the left lower quadrant.  Since then the patient's pain has become more intense, and has spread generally in the abdomen.  Patient does not report any specific aggravating or relieving factors.  It is associated with nausea and 3 episodes of nonbloody vomiting.  There is no diarrhea.  No trauma is reported no fevers reported.  Patient came to the ER today due to severity of pain and inability to tolerate p.o. diet.  Patient still has ongoing pain.  However she is feeling thirsty and requests that water be provided to her. Review of Systems: As mentioned in the history of present illness. All other systems reviewed and are negative. Past Medical History:  Diagnosis Date   Anxiety    Arthritis    Depression    Diverticulitis    Dyspnea    Edema    FEET/LEGS   HOH (hard of hearing)    Hypertension    Past Surgical History:  Procedure Laterality Date   ABDOMINAL HYSTERECTOMY     APPENDECTOMY     BACK SURGERY     CATARACT EXTRACTION W/PHACO Right 08/30/2018   Procedure: CATARACT EXTRACTION PHACO AND INTRAOCULAR LENS PLACEMENT (IOC);  Surgeon: Galen ManilaPorfilio, William, MD;  Location: [facility] ORS;  Service: Ophthalmology;  Laterality: Right;  US 01:07.1 CDE 13.66 Fluid Pack Lot # [id number]   CATARACT EXTRACTION W/PHACO Left 09/20/2018   Procedure: CATARACT EXTRACTION PHACO AND INTRAOCULAR LENS PLACEMENT (IOC);  Surgeon: Galen ManilaPorfilio, William, MD;  Location: [facility] ORS;  Service: Ophthalmology;   Laterality: Left;  US 01:09 CDE 11.45 fluid pack lot # [id number]   CHOLECYSTECTOMY     HYSTERECTOMY ABDOMINAL WITH SALPINGECTOMY     Social History:  reports that she has never smoked. She has never used smokeless tobacco. She reports that she does not drink alcohol and does not use drugs.  Allergies  Allergen Reactions   Penicillins Other (See Comments)    Reaction as a child, patient does not recall  Has patient had a PCN reaction causing immediate rash, facial/tongue/throat swelling, SOB or lightheadedness with hypotension: unknown Has patient had a PCN reaction causing severe rash involving mucus membranes or skin necrosis: unknown Has patient had a PCN reaction that required hospitalization: unknown Has patient had a PCN reaction occurring within the last 10 years: No If all of the above answers are "NO", then may proceed with Cephalosporin use.   Sulfa Antibiotics Nausea Only    Family History  Problem Relation Age of Onset   Hypertension Father    CAD Brother     Prior to Admission medications   Medication Sig Start Date End Date Taking? Authorizing Provider  acetaminophen (TYLENOL) 500 MG tablet Take 1,000 mg by mouth every 6 (six) hours as needed for mild pain or moderate pain.    Yes [provider]  amLODipine (NORVASC) 5 MG tablet Take 5 mg by mouth daily.   Yes [provider]  diphenhydrAMINE (BENADRYL) 50 MG tablet Take 1  tablet (50 mg total) by mouth at bedtime as needed for itching. 09/21/20  Yes Esaw Grandchild A, DO  escitalopram (LEXAPRO) 20 MG tablet Take 1 tablet (20 mg total) by mouth daily. 03/14/18  Yes Katha Hamming, MD  feeding supplement (ENSURE ENLIVE / ENSURE PLUS) LIQD Take 237 mLs by mouth 2 (two) times daily between meals. 09/22/20  Yes Esaw Grandchild A, DO  fluticasone (FLONASE) 50 MCG/ACT nasal spray Place 2 sprays into both nostrils 2 (two) times daily as needed for allergies or rhinitis.    Yes [provider]   ibuprofen (ADVIL,MOTRIN) 200 MG tablet Take 800 mg by mouth every 6 (six) hours as needed for headache or moderate pain.   Yes [provider]  lactulose (CEPHULAC) 20 g packet Take 1 packet (20 g total) by mouth daily as needed. 03/08/20  Yes Marrion Coy, MD  meloxicam (MOBIC) 15 MG tablet Take 15 mg by mouth daily. 12/30/22  Yes [provider]  methocarbamol (ROBAXIN) 500 MG tablet Take 1 tablet (500 mg total) by mouth every 6 (six) hours as needed for muscle spasms. 09/21/20  Yes Pennie Banter, DO  Multiple Vitamin (MULTIVITAMIN WITH MINERALS) TABS tablet Take 1 tablet by mouth daily. 09/22/20  Yes Esaw Grandchild A, DO  pantoprazole (PROTONIX) 40 MG tablet Take 1 tablet (40 mg total) by mouth 2 (two) times daily. 09/21/20  Yes Esaw Grandchild A, DO  rosuvastatin (CRESTOR) 20 MG tablet Take 20 mg by mouth at bedtime. 12/21/22  Yes [provider]  senna-docusate (SENOKOT-S) 8.6-50 MG tablet Take 2 tablets by mouth 2 (two) times daily as needed for mild constipation. 03/08/20  Yes Marrion Coy, MD  tiZANidine (ZANAFLEX) 2 MG tablet Take 2 mg by mouth 3 (three) times daily. 12/21/22  Yes [provider]  traMADol (ULTRAM) 50 MG tablet Take 50 mg by mouth daily as needed. 12/23/22  Yes [provider]  triamcinolone (KENALOG) 0.025 % cream Apply 1 Application topically 2 (two) times daily. 12/21/22  Yes [provider]  ondansetron (ZOFRAN-ODT) 4 MG disintegrating tablet Take 1 tablet (4 mg total) by mouth every 8 (eight) hours as needed for nausea or vomiting. Patient not taking: Reported on 02/08/2023 08/21/22   Corena Herter, MD  scopolamine (TRANSDERM-SCOP) 1 MG/3DAYS Place 1 patch (1.5 mg total) onto the skin every 3 (three) days. Patient not taking: Reported on 02/08/2023 09/22/20   Pennie Banter, DO    Physical Exam: Vitals:   02/08/23 1634 02/08/23 1635 02/08/23 2051  BP: (!) 165/98  (!) 160/66  Pulse: 94  77  Resp: 20  20  Temp:  98.6 F (37 C)    TempSrc: Oral    SpO2:  93% 93%   Patient confirms that she is chronically hard of hearing, does not appear to be in any distress Respiratory exam: Bilateral intravesicular Cardiovascular exam S1-S2 normal Abdomen: Obese, all quadrants are tender, however there is no guarding or rebound.  Bowel sounds are normal Extremities warm without edema Neurologic: Alert awake, hard of hearing chronically, no focal motor deficit Data Reviewed:  Labs on Admission:  Results for orders placed or performed during the hospital encounter of 02/08/23 (from the past 24 hour(s))  Lipase, blood     Status: None   Collection Time: 02/08/23  5:32 PM  Result Value Ref Range   Lipase 24 11 - 51 U/L  Comprehensive metabolic panel     Status: Abnormal   Collection Time: 02/08/23  5:32 PM  Result Value Ref Range   Sodium 140 135 - 145 mmol/L   Potassium 3.3 (L) 3.5 - 5.1 mmol/L   Chloride 101 98 - 111 mmol/L   CO2 26 22 - 32 mmol/L   Glucose, Bld 103 (H) 70 - 99 mg/dL   BUN 10 8 - 23 mg/dL   Creatinine, Ser 0.13 0.44 - 1.00 mg/dL   Calcium 8.6 (L) 8.9 - 10.3 mg/dL   Total Protein 6.8 6.5 - 8.1 g/dL   Albumin 3.0 (L) 3.5 - 5.0 g/dL   AST 23 15 - 41 U/L   ALT 13 0 - 44 U/L   Alkaline Phosphatase 70 38 - 126 U/L   Total Bilirubin 0.9 0.3 - 1.2 mg/dL   GFR, Estimated >14 >38 mL/min   Anion gap 13 5 - 15  CBC     Status: Abnormal   Collection Time: 02/08/23  5:32 PM  Result Value Ref Range   WBC 9.0 4.0 - 10.5 K/uL   RBC 5.50 (H) 3.87 - 5.11 MIL/uL   Hemoglobin 15.7 (H) 12.0 - 15.0 g/dL   HCT 88.7 (H) 57.9 - 72.8 %   MCV 84.9 80.0 - 100.0 fL   MCH 28.5 26.0 - 34.0 pg   MCHC 33.6 30.0 - 36.0 g/dL   RDW 20.6 01.5 - 61.5 %   Platelets 416 (H) 150 - 400 K/uL   nRBC 0.0 0.0 - 0.2 %   Radiological Exams on Admission:  CT ABDOMEN PELVIS W CONTRAST  Result Date: 02/08/2023 CLINICAL DATA:  Abdominal pain for 1 week, initial encounter EXAM: CT ABDOMEN AND PELVIS WITH CONTRAST TECHNIQUE:  Multidetector CT imaging of the abdomen and pelvis was performed using the standard protocol following bolus administration of intravenous contrast. RADIATION DOSE REDUCTION: This exam was performed according to the departmental dose-optimization program which includes automated exposure control, adjustment of the mA and/or kV according to patient size and/or use of iterative reconstruction technique. CONTRAST:  OMNIPAQUE IOHEXOL 300 MG/ML  SOLN COMPARISON:  08/21/2022 FINDINGS: Lower chest: Mild linear scarring is noted in the bases bilaterally. This is stable from the prior exam. Hepatobiliary: Gallbladder has been surgically removed. Prominence of the intrahepatic and extrahepatic biliary tree is noted consistent with the post cholecystectomy state. The liver is within normal limits. Pancreas: Unremarkable. No pancreatic ductal dilatation or surrounding inflammatory changes. Spleen: Normal in size without focal abnormality. Adrenals/Urinary Tract: Adrenal glands are within normal limits. Kidneys demonstrate a normal enhancement pattern bilaterally. No renal calculi or urinary tract obstructive changes are seen. Normal excretion is noted on delayed images. The bladder is decompressed. Stomach/Bowel: Diverticular change of the colon is noted with new diverticulitis in the distal more proximal sigmoid is within normal limits. Proximal colon shows no obstructive changes. The appendix has been surgically removed. Small bowel and stomach are unremarkable. Vascular/Lymphatic: Aortic atherosclerosis. No enlarged abdominal or pelvic lymph nodes. Reproductive: Status post hysterectomy. No adnexal masses. Other: No abdominal wall hernia or abnormality. No abdominopelvic ascites. Musculoskeletal: No acute or significant osseous findings. IMPRESSION: Changes consistent with sigmoid diverticulitis. No evidence of perforation or focal abscess is seen. Status post cholecystectomy. Electronically Signed   By: Alcide Clever  M.D.   On: 02/08/2023 20:37    EKG: Independently reviewed. NA   Assessment and Plan: * Diverticulitis Uncomplicated, however associated with significant nausea and vomiting and inability to tolerate p.o. diet.  Therefore at this time we will treat with intravenous ceftriaxone and metrondiazole.  IV fluids, see pain control below.  We can trial clear liquids and proceed as tolerated  Erythrocytosis Mild erythrocytosis.  Noted to be chronic, I will send an erythropoietin level.  I will send an outpatient hematology referral  Pain And ordered for Celebrex, acetaminophen for mild pain and oxycodone for moderate to severe pain, monitor pain scale      Advance Care Planning:   Code Status: Full Code patient advised me that she wishes to be full code.  Consults: Outpatient hematology referral has been sent by me  Family Communication: Per patient  Severity of Illness: The appropriate patient status for this patient is OBSERVATION. Observation status is judged to be reasonable and necessary in order to provide the required intensity of service to ensure the patient's safety. The patient's presenting symptoms, physical exam findings, and initial radiographic and laboratory data in the context of their medical condition is felt to place them at decreased risk for further clinical deterioration. Furthermore, it is anticipated that the patient will be medically stable for discharge from the hospital within 2 midnights of admission.   Author: Nolberto Hanlon, MD 02/08/2023 10:24 PM  For on call review [email or web address].

## 2023-02-09 DIAGNOSIS — E876 Hypokalemia: Secondary | ICD-10-CM

## 2023-02-09 DIAGNOSIS — K5792 Diverticulitis of intestine, part unspecified, without perforation or abscess without bleeding: Secondary | ICD-10-CM | POA: Diagnosis not present

## 2023-02-09 HISTORY — DX: Hypokalemia: E87.6

## 2023-02-09 LAB — CBC
HCT: 44.9 % (ref 36.0–46.0)
Hemoglobin: 14.7 g/dL (ref 12.0–15.0)
MCH: 28.8 pg (ref 26.0–34.0)
MCHC: 32.7 g/dL (ref 30.0–36.0)
MCV: 87.9 fL (ref 80.0–100.0)
Platelets: 396 10*3/uL (ref 150–400)
RBC: 5.11 MIL/uL (ref 3.87–5.11)
RDW: 14 % (ref 11.5–15.5)
WBC: 9.4 10*3/uL (ref 4.0–10.5)
nRBC: 0 % (ref 0.0–0.2)

## 2023-02-09 LAB — BASIC METABOLIC PANEL
Anion gap: 6 (ref 5–15)
BUN: 10 mg/dL (ref 8–23)
CO2: 29 mmol/L (ref 22–32)
Calcium: 8.2 mg/dL — ABNORMAL LOW (ref 8.9–10.3)
Chloride: 107 mmol/L (ref 98–111)
Creatinine, Ser: 0.92 mg/dL (ref 0.44–1.00)
GFR, Estimated: >60 mL/min (ref >60)
Glucose, Bld: 126 mg/dL — ABNORMAL HIGH (ref 70–99)
Potassium: 4.1 mmol/L (ref 3.5–5.1)
Sodium: 142 mmol/L (ref 135–145)

## 2023-02-09 LAB — APTT: aPTT: 25 seconds (ref 24–36)

## 2023-02-09 LAB — PROTIME-INR
INR: 1.1 (ref 0.8–1.2)
Prothrombin Time: 14.2 seconds (ref 11.4–15.2)

## 2023-02-09 MED ORDER — METHOCARBAMOL 500 MG PO TABS
500.0000 mg | ORAL_TABLET | Freq: Four times a day (QID) | ORAL | Status: DC | PRN
  Administered 2023-02-12 – 2023-02-18 (×3): 500 mg via ORAL
  Filled 2023-02-09 (×3): qty 1

## 2023-02-09 MED ORDER — LABETALOL HCL 5 MG/ML IV SOLN: 10.0000 mg | INTRAVENOUS | Status: DC | PRN

## 2023-02-09 MED ORDER — LORAZEPAM 0.5 MG PO TABS
0.5000 mg | ORAL_TABLET | Freq: Two times a day (BID) | ORAL | Status: DC
  Administered 2023-02-09 – 2023-02-18 (×18): 0.5 mg via ORAL
  Filled 2023-02-09 (×19): qty 1

## 2023-02-09 MED ORDER — MORPHINE SULFATE (PF) 2 MG/ML IV SOLN: 2.0000 mg | INTRAVENOUS | Status: DC | PRN

## 2023-02-09 MED ORDER — TRAMADOL HCL 50 MG PO TABS
50.0000 mg | ORAL_TABLET | Freq: Two times a day (BID) | ORAL | Status: DC
  Administered 2023-02-09 – 2023-02-18 (×17): 50 mg via ORAL
  Filled 2023-02-09 (×17): qty 1

## 2023-02-09 MED ORDER — HYDRALAZINE HCL 20 MG/ML IJ SOLN
10.0000 mg | INTRAMUSCULAR | Status: DC | PRN
  Administered 2023-02-09 – 2023-02-11 (×3): 10 mg via INTRAVENOUS
  Filled 2023-02-09 (×3): qty 1

## 2023-02-09 NOTE — Progress Notes (Signed)
PT Cancellation Note  Patient Details Name: Marilyn Andrews MRN: [id number] DOB: [birth year]   Cancelled Treatment:    Reason Eval/Treat Not Completed: Pain limiting ability to participate. Orders received and chart reviewed. Upon entry pt declining PT eval due to her pain despite pain management. Pt educated on OOB benefits and role of PT in acute setting with pt continuing to decline participation. PT to re-attempt at a later time/date.   Delphia Grates. Fairly IV, PT, DPT Physical Therapist- [place]  [facility]  02/09/2023, 1:27 PM

## 2023-02-09 NOTE — Assessment & Plan Note (Addendum)
K 3.3 on admission, replaced. K has remained stable  --Monitor BMP at follow up

## 2023-02-09 NOTE — Progress Notes (Addendum)
Progress Note   Patient: Marilyn Andrews KYH:[id number] DOB: [birth year] DOA: 02/08/2023     0 DOS: the patient was seen and examined on 02/09/2023   Brief hospital course: HPI on admission, per Dr. Maryjean Ka: "Marilyn Andrews is a 87 y.o. female with medical history significant of prior episodes of diverticulitis.  Patient was in her usual state of health till about 5 days ago when she reports a new onset of constant aching pain in the left lower quadrant.  Since then the patient's pain has become more intense, and has spread generally in the abdomen.  Patient does not report any specific aggravating or relieving factors.  It is associated with nausea and 3 episodes of nonbloody vomiting.  There is no diarrhea.  No trauma is reported no fevers reported.  Patient came to the ER today due to severity of pain and inability to tolerate p.o. diet. "   Patient was admitted evening of 02/08/2023 for acute diverticulitis, being treated with IV antibiotics, IV fluids. On clear liquid diet.  Assessment and Plan: * Acute diverticulitis Uncomplicated, however associated with significant nausea and vomiting and inability to tolerate p.o. diet --Continue IV Rocephin, Flagyl --Continue IV hydration, IV antiemetics PRN --Clear liquids, not ready to advance yet today --Monitor abdominal exam closely and repeat CT if worsening pain or exam or fevers & would consult general surgery in that case  Hypokalemia K 3.3 on admission, normalized with replacement.  K 4.1 today. --Monitor BMP, replace PRN --Check Mg level  Erythrocytosis Mild erythrocytosis on admission.  Noted to be chronic. --f/u erythropoietin level --outpatient hematology referral  Chronic pain Patient reports chronic low back and bilateral knee pain due to arthritis.  Also Acute abdominal pain due to diverticulitis.  On tramadol 50 my once daily at home. --Will schedule Tramadol 50 mg BID for now --Resume home Robaxin, hold Zanaflex --Celebrex,  acetaminophen for mild pain --Oxycodone for moderate to severe pain --IV morphine in unable to take PO meds or for breakthrough pain  GERD (gastroesophageal reflux disease) Continue home PPI BID        Subjective: Pt seen awake resting in bed today.  Reports ongoing LLQ pain, but no N/V since getting "up here".  No fever or chills.  Reports severe chronic pain in her back and both knees, requests her pain meds.  Does not feel ready to advance diet yet today, but tolerating some clears.  Physical Exam: Vitals:   02/08/23 2317 02/08/23 2320 02/09/23 0514 02/09/23 0815  BP:  (!) 173/70 (!) 172/87 (!) 162/83  Pulse:  64 (!) 102 85  Resp:  18 18 18   Temp:  98.2 F (36.8 C) 97.8 F (36.6 C) 98.8 F (37.1 C)  TempSrc:  Oral Oral   SpO2:  97% 99% 99%  Weight: 76.6 kg     Height: 5\' 3"  (1.6 m)      General exam: awake, alert, no acute distress HEENT: moist mucus membranes, hard of hearing  Respiratory system: CTAB, no wheezes, rales or rhonchi, normal respiratory effort. Cardiovascular system: normal S1/S2, RRR, no JVD, murmurs, rubs, gallops, no pedal edema.   Gastrointestinal system: soft, +LLQ tenderness without guarding or rebound, ND, +bowel sounds. Central nervous system: A&O x3. no gross focal neurologic deficits, normal speech Extremities: moves all, no edema, normal tone Skin: dry, intact, normal temperature Psychiatry: normal mood, congruent affect, judgement and insight appear normal   Data Reviewed:  Notable labs --- BMP normal except glucose 126, Ca 8.2.  Normal  CBC   Family Communication: Son, Marilyn Andrews updated by phone this afternoon  Disposition: Status is: Observation The patient remains OBS appropriate and will d/c before 2 midnights.   Planned Discharge Destination: Home    Time spent: 42 minutes  Author: Pennie Banter, DO 02/09/2023 3:00 PM  For on call review [email or web address].

## 2023-02-09 NOTE — Progress Notes (Signed)
Patient is refusing SCD's due to chronic leg pain. Patient was educated on importance of SCD pumps and still refused. Care ongoing

## 2023-02-09 NOTE — Assessment & Plan Note (Addendum)
Uncomplicated, however associated with significant nausea and vomiting and inability to tolerate p.o. diet at time of admission. Clinically improved, tolerating diet. Still having intermittent nausea. Repeat CT scan of the abdomen showed some improvement with no complication or diverticulitis.  Completed 10 days antibiotics, initially IV Rocephin, Flagyl, followed by PO Augmentin. --Zofran ODT's PRN nausea for d/c --Monitor for recurrent symptoms

## 2023-02-09 NOTE — Hospital Course (Signed)
HPI on admission, per Dr. Maryjean Ka: "Marilyn Andrews is a 87 y.o. female with medical history significant of prior episodes of diverticulitis.  Patient was in her usual state of health till about 5 days ago when she reports a new onset of constant aching pain in the left lower quadrant.  Since then the patient's pain has become more intense, and has spread generally in the abdomen.  Patient does not report any specific aggravating or relieving factors.  It is associated with nausea and 3 episodes of nonbloody vomiting.  There is no diarrhea.  No trauma is reported no fevers reported.  Patient came to the ER today due to severity of pain and inability to tolerate p.o. diet. "   Patient was admitted evening of 02/08/2023 for acute diverticulitis, being treated with IV antibiotics, IV fluids. On clear liquid diet.

## 2023-02-09 NOTE — Assessment & Plan Note (Signed)
Continue home PPI BID 

## 2023-02-10 DIAGNOSIS — M170 Bilateral primary osteoarthritis of knee: Secondary | ICD-10-CM | POA: Diagnosis present

## 2023-02-10 DIAGNOSIS — D751 Secondary polycythemia: Secondary | ICD-10-CM | POA: Diagnosis present

## 2023-02-10 DIAGNOSIS — R531 Weakness: Secondary | ICD-10-CM | POA: Diagnosis present

## 2023-02-10 DIAGNOSIS — M545 Low back pain, unspecified: Secondary | ICD-10-CM | POA: Diagnosis present

## 2023-02-10 DIAGNOSIS — I10 Essential (primary) hypertension: Secondary | ICD-10-CM | POA: Diagnosis present

## 2023-02-10 DIAGNOSIS — K219 Gastro-esophageal reflux disease without esophagitis: Secondary | ICD-10-CM | POA: Diagnosis present

## 2023-02-10 DIAGNOSIS — Z882 Allergy status to sulfonamides status: Secondary | ICD-10-CM | POA: Diagnosis not present

## 2023-02-10 DIAGNOSIS — Z79899 Other long term (current) drug therapy: Secondary | ICD-10-CM | POA: Diagnosis not present

## 2023-02-10 DIAGNOSIS — Z880 Allergy status to penicillin: Secondary | ICD-10-CM | POA: Diagnosis not present

## 2023-02-10 DIAGNOSIS — E876 Hypokalemia: Secondary | ICD-10-CM | POA: Diagnosis present

## 2023-02-10 DIAGNOSIS — Z1152 Encounter for screening for COVID-19: Secondary | ICD-10-CM | POA: Diagnosis not present

## 2023-02-10 DIAGNOSIS — G8929 Other chronic pain: Secondary | ICD-10-CM | POA: Diagnosis present

## 2023-02-10 DIAGNOSIS — Z751 Person awaiting admission to adequate facility elsewhere: Secondary | ICD-10-CM | POA: Diagnosis not present

## 2023-02-10 DIAGNOSIS — M7989 Other specified soft tissue disorders: Secondary | ICD-10-CM | POA: Diagnosis present

## 2023-02-10 DIAGNOSIS — F32A Depression, unspecified: Secondary | ICD-10-CM | POA: Diagnosis present

## 2023-02-10 DIAGNOSIS — F419 Anxiety disorder, unspecified: Secondary | ICD-10-CM | POA: Diagnosis present

## 2023-02-10 DIAGNOSIS — R112 Nausea with vomiting, unspecified: Secondary | ICD-10-CM | POA: Diagnosis present

## 2023-02-10 DIAGNOSIS — K5792 Diverticulitis of intestine, part unspecified, without perforation or abscess without bleeding: Secondary | ICD-10-CM | POA: Diagnosis present

## 2023-02-10 DIAGNOSIS — K5732 Diverticulitis of large intestine without perforation or abscess without bleeding: Secondary | ICD-10-CM | POA: Diagnosis present

## 2023-02-10 DIAGNOSIS — Z791 Long term (current) use of non-steroidal anti-inflammatories (NSAID): Secondary | ICD-10-CM | POA: Diagnosis not present

## 2023-02-10 DIAGNOSIS — Z8249 Family history of ischemic heart disease and other diseases of the circulatory system: Secondary | ICD-10-CM | POA: Diagnosis not present

## 2023-02-10 LAB — CBC
HCT: 42.1 % (ref 36.0–46.0)
Hemoglobin: 13.5 g/dL (ref 12.0–15.0)
MCH: 28.4 pg (ref 26.0–34.0)
MCHC: 32.1 g/dL (ref 30.0–36.0)
MCV: 88.4 fL (ref 80.0–100.0)
Platelets: 370 10*3/uL (ref 150–400)
RBC: 4.76 MIL/uL (ref 3.87–5.11)
RDW: 14.1 % (ref 11.5–15.5)
WBC: 7.5 10*3/uL (ref 4.0–10.5)
nRBC: 0 % (ref 0.0–0.2)

## 2023-02-10 LAB — URINALYSIS, ROUTINE W REFLEX MICROSCOPIC
Bilirubin Urine: NEGATIVE
Glucose, UA: NEGATIVE mg/dL
Hgb urine dipstick: NEGATIVE
Ketones, ur: NEGATIVE mg/dL
Nitrite: NEGATIVE
Protein, ur: NEGATIVE mg/dL
Specific Gravity, Urine: 1.014 (ref 1.005–1.030)
pH: 8 (ref 5.0–8.0)

## 2023-02-10 LAB — BASIC METABOLIC PANEL
Anion gap: 6 (ref 5–15)
BUN: 5 mg/dL — ABNORMAL LOW (ref 8–23)
CO2: 27 mmol/L (ref 22–32)
Calcium: 8.1 mg/dL — ABNORMAL LOW (ref 8.9–10.3)
Chloride: 106 mmol/L (ref 98–111)
Creatinine, Ser: 0.78 mg/dL (ref 0.44–1.00)
GFR, Estimated: >60 mL/min (ref >60)
Glucose, Bld: 133 mg/dL — ABNORMAL HIGH (ref 70–99)
Potassium: 3.9 mmol/L (ref 3.5–5.1)
Sodium: 139 mmol/L (ref 135–145)

## 2023-02-10 LAB — MAGNESIUM: Magnesium: 2.5 mg/dL — ABNORMAL HIGH (ref 1.7–2.4)

## 2023-02-10 LAB — ERYTHROPOIETIN: Erythropoietin: 7.5 m[IU]/mL (ref 2.6–18.5)

## 2023-02-10 NOTE — Plan of Care (Signed)
  Problem: Education: Goal: Knowledge of General Education information will improve Description: Including pain rating scale, medication(s)/side effects and non-pharmacologic comfort measures Outcome: Progressing   Problem: Clinical Measurements: Goal: Respiratory complications will improve Outcome: Progressing Goal: Cardiovascular complication will be avoided Outcome: Progressing   Problem: Activity: Goal: Risk for activity intolerance will decrease Outcome: Progressing   Problem: Coping: Goal: Level of anxiety will decrease Outcome: Progressing   Problem: Elimination: Goal: Will not experience complications related to bowel motility Outcome: Progressing Goal: Will not experience complications related to urinary retention Outcome: Progressing   Problem: Pain Managment: Goal: General experience of comfort will improve Outcome: Progressing   Problem: Safety: Goal: Ability to remain free from injury will improve Outcome: Progressing   Problem: Skin Integrity: Goal: Risk for impaired skin integrity will decrease Outcome: Progressing   

## 2023-02-10 NOTE — Evaluation (Signed)
Physical Therapy Evaluation Patient Details Name: Marilyn Andrews MRN: [id number] DOB: 1936/07/20 Today's Date: 02/10/2023  History of Present Illness  Marilyn Andrews is a 87 y.o. female with medical history significant of prior episodes of diverticulitis.  Patient was in her usual state of health till about 5 days ago when she reports a new onset of constant aching pain in the left lower quadrant.   Clinical Impression  Pt admitted with above diagnosis. Pt currently with functional limitations due to the deficits listed below (see PT Problem List). Pt received upright in bed agreeable to PT/OT co-eval reporting needing assist to exit bed pan. At baseline pt is mod-I with RW in household and ADL completion. Reports son and DIL assist with IADL's and navigating steps for doctor appointments.   To date pt relies on max multimodal cuing due to being extremely HoH. She is minA for rolling R/L relying on bed rails and dependent for pericare. ModA+2 for supine to sit with heavy posterior lean as pt unable to place feet on floor due to reports feeling dizzy needing consistent support at torso to maintain but overall pt reporting need to return to supine. She is maxA+2 to scoot up in bed and for repositioning. BP taken in bed recording vitals as listed below. Pt left in care of OT. Pt will benefit from f/u PT services at discharge to address deficits in pain, LE weakness, and dizziness to optimize return to PLOF.    BP: 145/64 mm Hg HR: 73 BPM  SPO2: 98%    Recommendations for follow up therapy are one component of a multi-disciplinary discharge planning process, led by the attending physician.  Recommendations may be updated based on patient status, additional functional criteria and insurance authorization.  Follow Up Recommendations Can patient physically be transported by private vehicle: No     Assistance Recommended at Discharge Intermittent Supervision/Assistance  Patient can return home with the  following  Two people to help with walking and/or transfers;Two people to help with bathing/dressing/bathroom;Help with stairs or ramp for entrance;Assist for transportation    Equipment Recommendations None recommended by PT  Recommendations for Other Services       Functional Status Assessment Patient has had a recent decline in their functional status and demonstrates the ability to make significant improvements in function in a reasonable and predictable amount of time.     Precautions / Restrictions Precautions Precautions: Fall Restrictions Weight Bearing Restrictions: No      Mobility  Bed Mobility Overal bed mobility: Needs Assistance Bed Mobility: Supine to Sit, Sit to Supine, Rolling Rolling: Min assist (use of bed rail)   Supine to sit: Mod assist, +2 for physical assistance, HOB elevated     General bed mobility comments: multimodal cuing due to being [facility] Patient Response: Cooperative  Transfers                   General transfer comment: deferred due to dizziness    Ambulation/Gait               General Gait Details: deferred due to dizziness  Stairs            Wheelchair Mobility    Modified Rankin (Stroke Patients Only)       Balance Overall balance assessment: Needs assistance Sitting-balance support: Feet unsupported, Bilateral upper extremity supported Sitting balance-Leahy Scale: Poor Sitting balance - Comments: unable to place feet on floor, posterior bias due to this. Postural control: Posterior lean  Pertinent Vitals/Pain Pain Assessment Pain Assessment: Faces Faces Pain Scale: Hurts little more Pain Location: abdomen, lower back Pain Descriptors / Indicators: Aching, Discomfort, Grimacing, Sharp Pain Intervention(s): Limited activity within patient's tolerance, Monitored during session, Repositioned, Patient requesting pain meds-RN notified    Home Living  Family/patient expects to be discharged to:: Private residence Living Arrangements: Alone Available Help at Discharge: Family;Available PRN/intermittently Type of Home: House Home Access: Stairs to enter Entrance Stairs-Rails: Right;Left;Can reach both Entrance Stairs-Number of Steps: 4   Home Layout: One level Home Equipment: Agricultural consultant (2 wheels);Cane - single point      Prior Function Prior Level of Function : Needs assist       Physical Assist : ADLs (physical)     Mobility Comments: uses RW household distances. ADLs Comments: son and DIL assist with IADL's, meals on wheels     Hand Dominance        Extremity/Trunk Assessment   Upper Extremity Assessment Upper Extremity Assessment: Defer to OT evaluation    Lower Extremity Assessment Lower Extremity Assessment: Generalized weakness       Communication   Communication: HOH  Cognition Arousal/Alertness: Awake/alert Behavior During Therapy: WFL for tasks assessed/performed Overall Cognitive Status: Difficult to assess                                          General Comments      Exercises Other Exercises Other Exercises: Role of PT/OT in acute setting   Assessment/Plan    PT Assessment Patient needs continued PT services  PT Problem List Decreased strength;Pain;Decreased activity tolerance;Decreased balance;Decreased mobility       PT Treatment Interventions DME instruction;Therapeutic exercise;Balance training;Gait training;Neuromuscular re-education;Functional mobility training;Therapeutic activities;Patient/family education;Stair training    PT Goals (Current goals can be found in the Care Plan section)  Acute Rehab PT Goals Patient Stated Goal: improve her pain PT Goal Formulation: With patient Time For Goal Achievement: 02/24/23 Potential to Achieve Goals: Fair    Frequency Min 2X/week     Co-evaluation PT/OT/SLP Co-Evaluation/Treatment: Yes Reason for  Co-Treatment: For patient/therapist safety PT goals addressed during session: Mobility/safety with mobility;Balance;Strengthening/ROM OT goals addressed during session: ADL's and self-care;Proper use of Adaptive equipment and DME;Strengthening/ROM       AM-PAC PT "6 Clicks" Mobility  Outcome Measure Help needed turning from your back to your side while in a flat bed without using bedrails?: A Lot Help needed moving from lying on your back to sitting on the side of a flat bed without using bedrails?: A Lot Help needed moving to and from a bed to a chair (including a wheelchair)?: Total Help needed standing up from a chair using your arms (e.g., wheelchair or bedside chair)?: A Lot Help needed to walk in hospital room?: Total Help needed climbing 3-5 steps with a railing? : Total 6 Click Score: 9    End of Session   Activity Tolerance: Patient limited by pain;Other (comment) (dizziness) Patient left: in bed;with call bell/phone within reach;with bed alarm set Nurse Communication: Mobility status PT Visit Diagnosis: Muscle weakness (generalized) (M62.81);Other abnormalities of gait and mobility (R26.89);Difficulty in walking, not elsewhere classified (R26.2)    Time: 6967-8938 PT Time Calculation (min) (ACUTE ONLY): 21 min   Charges:   PT Evaluation $PT Eval Moderate Complexity: 1 Mod          Karley Pho M. Fairly IV, PT, DPT Physical Therapist- [facility]  02/10/2023, 10:45 AM

## 2023-02-10 NOTE — Progress Notes (Signed)
Urine sample collected and sent to Lab

## 2023-02-10 NOTE — Progress Notes (Signed)
Progress Note   Patient: Marilyn Andrews CWC:[id number] DOB: 02-04-1936 DOA: 02/08/2023     0 DOS: the patient was seen and examined on 02/10/2023   Subjective:    Patient seen and examined at bedside this morning Still has some mid to lower abdominal pain which is improving Denies nausea vomiting chest pain or cough  Brief hospital course: HPI on admission, per Dr. Maryjean Ka: "Sherrol Val [place]old is a 87 y.o. female with medical history significant of prior episodes of diverticulitis.  Patient was in her usual state of health till about 5 days ago when she reports a new onset of constant aching pain in the left lower quadrant.  Since then the patient's pain has become more intense, and has spread generally in the abdomen.  Patient does not report any specific aggravating or relieving factors.  It is associated with nausea and 3 episodes of nonbloody vomiting.  There is no diarrhea.  No trauma is reported no fevers reported.  Patient came to the ER today due to severity of pain and inability to tolerate p.o. diet. " Patient was admitted evening of 02/08/2023 for acute diverticulitis, being treated with IV antibiotics, IV fluids. On clear liquid diet.   Assessment and Plan: * Acute diverticulitis Uncomplicated, however associated with significant nausea and vomiting and inability to tolerate p.o. diet --Continue IV Rocephin, Flagyl --Continue IV hydration, IV antiemetics PRN --Clear liquids, not ready to advance yet today --Monitor abdominal exam closely and repeat CT if worsening pain or exam or fevers & would consult general surgery in that case   Hypokalemia K 3.3 on admission, normalized with replacement.  K 4.1 today. --Monitor BMP, replace PRN --Check Mg level   Erythrocytosis Mild erythrocytosis on admission.  Noted to be chronic. --f/u erythropoietin level --outpatient hematology referral   Chronic pain Patient reports chronic low back and bilateral knee pain due to arthritis.  Also Acute  abdominal pain due to diverticulitis.  On tramadol 50 my once daily at home. --Will schedule Tramadol 50 mg BID for now --ResumeD home Robaxin, hold Zanaflex --Celebrex, acetaminophen for mild pain --Oxycodone for moderate to severe pain --IV morphine iF unable to take PO meds or for breakthrough pain   GERD (gastroesophageal reflux disease) Continue home PPI BID    Data Reviewed: Report results reviewed by me today   Family Communication: Son, Lelon Huh updated by phone    Disposition: Status is: Observation The patient remains OBS appropriate and will d/c before 2 midnights.    Planned Discharge Destination: Home     Time spent: 38 minutes  Physical Exam:  General exam: awake, alert, no acute distress HEENT: moist mucus membranes, hard of hearing  Respiratory system: CTAB, no wheezes, rales or rhonchi, normal respiratory effort. Cardiovascular system: normal S1/S2, RRR, no JVD, murmurs, rubs, gallops, no pedal edema.   Gastrointestinal system: soft, +LLQ tenderness without guarding or rebound, ND, +bowel sounds. Central nervous system: A&O x3. no gross focal neurologic deficits, normal speech Extremities: moves all, no edema, normal tone Skin: dry, intact, normal temperature Psychiatry: normal mood, congruent affect, judgement and insight appear normal     Vitals:   02/10/23 0751 02/10/23 0857 02/10/23 1100 02/10/23 1459  BP: (!) 184/80 (!) 158/71 (!) 146/68 (!) 149/69  Pulse: 79 70  90  Resp: 16   16  Temp: 98 F (36.7 C)   98.1 F (36.7 C)  TempSrc:    Oral  SpO2: 98%   97%  Weight:  Height:        Time spent: 35 minutes  Author: Loyce Dys, MD 02/10/2023 3:44 PM  For on call review [email or web address].

## 2023-02-10 NOTE — Evaluation (Signed)
Occupational Therapy Evaluation Patient Details Name: Marilyn Andrews MRN: [id number] DOB: 12/01/1935 Today's Date: 02/10/2023   History of Present Illness Marilyn Andrews is a 87 y.o. female with medical history significant of prior episodes of diverticulitis.  Patient was in her usual state of health until about 5 days ago when she reports a new onset of constant aching pain in the left lower quadrant.   Clinical Impression   Patient received for OT evaluation. See flowsheet below for details of function. Generally, patient requiring MIN A for rolling, MOD A x2 for bed mobility to EOB, unable to perform functional mobility, and MIN-MAX A for ADLs. Patient will benefit from continued OT while in acute care.       Recommendations for follow up therapy are one component of a multi-disciplinary discharge planning process, led by the attending physician.  Recommendations may be updated based on patient status, additional functional criteria and insurance authorization.   Assistance Recommended at Discharge Frequent or constant Supervision/Assistance  Patient can return home with the following Two people to help with walking and/or transfers;A lot of help with bathing/dressing/bathroom;Assistance with cooking/housework;Direct supervision/assist for medications management;Direct supervision/assist for financial management;Assist for transportation;Help with stairs or ramp for entrance    Functional Status Assessment  Patient has had a recent decline in their functional status and demonstrates the ability to make significant improvements in function in a reasonable and predictable amount of time.  Equipment Recommendations  Other (comment) (defer to next venue of care)    Recommendations for Other Services       Precautions / Restrictions Precautions Precautions: Fall Restrictions Weight Bearing Restrictions: No      Mobility Bed Mobility Overal bed mobility: Needs Assistance Bed Mobility:  Supine to Sit, Sit to Supine, Rolling Rolling: Min assist (with bed rail)   Supine to sit: Mod assist, +2 for physical assistance, HOB elevated Sit to supine: Mod assist, +2 for physical assistance   General bed mobility comments: Pt unable to achieve fully upright seated position at EOB 2/2 pain and dizziness; able to sit almost upright for about 20 seconds, then pt needing to lay down; MAX A sitting balance throughout    Transfers                   General transfer comment: unable to attempt today      Balance                                           ADL either performed or assessed with clinical judgement   ADL Overall ADL's : Needs assistance/impaired Eating/Feeding: Supervision/ safety;Set up;Bed level Eating/Feeding Details (indicate cue type and reason): Set up for drinking coffee and broth in cups per pt request.                         Toileting- Clothing Manipulation and Hygiene: Total assistance Toileting - Clothing Manipulation Details (indicate cue type and reason): Pt received on bed pan; OT assist while pt rolled for removing bedpan and performing hygiene and applying powder at pt request       General ADL Comments: pt not able to demonstrate many ADLs today; able to use UE functionally to reach for rails during rolling; generally weak.  Anticipate MAX A for most ADLs.     Vision  Perception     Praxis      Pertinent Vitals/Pain Pain Assessment Pain Assessment: 0-10 Pain Score:  (does not rate) Pain Location: abdomen, lower back Pain Descriptors / Indicators: Aching, Discomfort, Grimacing, Sharp Pain Intervention(s): Limited activity within patient's tolerance, Monitored during session, Other (comment) (told RN about pt's pain; medication not due for another 45 minutes.)     Hand Dominance     Extremity/Trunk Assessment Upper Extremity Assessment Upper Extremity Assessment: Generalized weakness   Lower  Extremity Assessment Lower Extremity Assessment: Generalized weakness       Communication Communication Communication: HOH   Cognition Arousal/Alertness: Awake/alert Behavior During Therapy: WFL for tasks assessed/performed Overall Cognitive Status: No family/caregiver present to determine baseline cognitive functioning                                 General Comments: Pt generally grumpy; confused about medication timing (claiming that RN is not correct in her timing). When asked what day it is, pt stating it is Wednesday, but clearly looking at the board.     General Comments  Pt appearing to have decreased motivation for activity today. 98% O2 saturation on 1 L today. BP in semi-reclined (after seated and feeling dizzy) was 145/64.    Exercises     Shoulder Instructions      Home Living Family/patient expects to be discharged to:: Private residence Living Arrangements: Alone Available Help at Discharge: Family;Available PRN/intermittently Type of Home: House Home Access: Stairs to enter Entergy CorporationEntrance Stairs-Number of Steps: 4 Entrance Stairs-Rails: Right;Left;Can reach both Home Layout: One level     Bathroom Shower/Tub: Tub/shower unit         Home Equipment: Agricultural consultantolling Walker (2 wheels);Cane - single point          Prior Functioning/Environment Prior Level of Function : Needs assist       Physical Assist : ADLs (physical)     Mobility Comments: uses RW household distances. ADLs Comments: son and DIL assist with IADL's, meals on wheels. Pt (I) ADLs.        OT Problem List: Decreased strength;Decreased activity tolerance;Impaired balance (sitting and/or standing);Decreased cognition;Decreased safety awareness      OT Treatment/Interventions: Self-care/ADL training;Therapeutic exercise;Therapeutic activities;DME and/or AE instruction    OT Goals(Current goals can be found in the care plan section) Acute Rehab OT Goals Patient Stated Goal: Get  better; pain to stop OT Goal Formulation: With patient Time For Goal Achievement: 02/24/23 Potential to Achieve Goals: Fair ADL Goals Pt Will Perform Grooming: with modified independence;standing Pt Will Perform Lower Body Dressing: with modified independence;sit to/from stand Pt Will Transfer to Toilet: with modified independence;bedside commode Pt Will Perform Toileting - Clothing Manipulation and hygiene: with modified independence;sit to/from stand  OT Frequency: Min 1X/week    Co-evaluation PT/OT/SLP Co-Evaluation/Treatment: Yes Reason for Co-Treatment: For patient/therapist safety PT goals addressed during session: Mobility/safety with mobility;Balance;Strengthening/ROM OT goals addressed during session: ADL's and self-care;Proper use of Adaptive equipment and DME;Strengthening/ROM      AM-PAC OT "6 Clicks" Daily Activity     Outcome Measure Help from another person eating meals?: A Little Help from another person taking care of personal grooming?: A Little Help from another person toileting, which includes using toliet, bedpan, or urinal?: Total Help from another person bathing (including washing, rinsing, drying)?: A Lot Help from another person to put on and taking off regular upper body clothing?: A Lot Help from another person  to put on and taking off regular lower body clothing?: Total 6 Click Score: 12   End of Session Nurse Communication: Mobility status  Activity Tolerance: Patient limited by pain;Patient limited by fatigue Patient left: in bed;with bed alarm set;with call bell/phone within reach  OT Visit Diagnosis: Muscle weakness (generalized) (M62.81)                Time: 1975-8832 OT Time Calculation (min): 26 min Charges:  OT General Charges $OT Visit: 1 Visit OT Evaluation $OT Eval Moderate Complexity: 1 Mod [place] Junie Panning, MS, OTR/L  Alvester Morin 02/10/2023, 12:02 PM

## 2023-02-10 NOTE — Progress Notes (Signed)
Home medication still in patients room. Patient is refusing to let nursing staff take the medication to the pharmacy. Education done.  Nursing manager and charge nurse aware.

## 2023-02-10 NOTE — Progress Notes (Signed)
Patient has medications from home in the closet at the bedside, these include tramadol and lorazepam. Patient refusing to allow RN to count medications and send them to the pharmacy. Patient's medication is in patient belonging bag, in the tall cabinet in the patient's room and out of the patient's reach.  Consulting civil engineer notified. RN will pass along to dayshift RN as well.

## 2023-02-11 DIAGNOSIS — K5792 Diverticulitis of intestine, part unspecified, without perforation or abscess without bleeding: Secondary | ICD-10-CM | POA: Diagnosis not present

## 2023-02-11 LAB — BASIC METABOLIC PANEL
Anion gap: 6 (ref 5–15)
BUN: <5 mg/dL — ABNORMAL LOW (ref 8–23)
CO2: 26 mmol/L (ref 22–32)
Calcium: 8.2 mg/dL — ABNORMAL LOW (ref 8.9–10.3)
Chloride: 107 mmol/L (ref 98–111)
Creatinine, Ser: 0.78 mg/dL (ref 0.44–1.00)
GFR, Estimated: >60 mL/min (ref >60)
Glucose, Bld: 114 mg/dL — ABNORMAL HIGH (ref 70–99)
Potassium: 4 mmol/L (ref 3.5–5.1)
Sodium: 139 mmol/L (ref 135–145)

## 2023-02-11 LAB — CBC WITH DIFFERENTIAL/PLATELET
Abs Immature Granulocytes: 0.05 10*3/uL (ref 0.00–0.07)
Basophils Absolute: 0 10*3/uL (ref 0.0–0.1)
Basophils Relative: 1 %
Eosinophils Absolute: 0.1 10*3/uL (ref 0.0–0.5)
Eosinophils Relative: 1 %
HCT: 41 % (ref 36.0–46.0)
Hemoglobin: 13.1 g/dL (ref 12.0–15.0)
Immature Granulocytes: 1 %
Lymphocytes Relative: 12 %
Lymphs Abs: 0.9 10*3/uL (ref 0.7–4.0)
MCH: 28.6 pg (ref 26.0–34.0)
MCHC: 32 g/dL (ref 30.0–36.0)
MCV: 89.5 fL (ref 80.0–100.0)
Monocytes Absolute: 0.4 10*3/uL (ref 0.1–1.0)
Monocytes Relative: 6 %
Neutro Abs: 5.5 10*3/uL (ref 1.7–7.7)
Neutrophils Relative %: 79 %
Platelets: 371 10*3/uL (ref 150–400)
RBC: 4.58 MIL/uL (ref 3.87–5.11)
RDW: 14.2 % (ref 11.5–15.5)
WBC: 7 10*3/uL (ref 4.0–10.5)
nRBC: 0 % (ref 0.0–0.2)

## 2023-02-11 MED ORDER — AMLODIPINE BESYLATE 10 MG PO TABS
10.0000 mg | ORAL_TABLET | Freq: Every day | ORAL | Status: DC
  Administered 2023-02-11 – 2023-02-18 (×8): 10 mg via ORAL
  Filled 2023-02-11 (×8): qty 1

## 2023-02-11 NOTE — Progress Notes (Signed)
Progress Note   Patient: Marilyn Andrews IDU:[id number] DOB: 05-22-1936 DOA: 02/08/2023     1 DOS: the patient was seen and examined on 02/11/2023    Subjective:    Patient seen and examined at bedside this morning Abdominal pain continues to improve Patient was however worried about high blood pressure Amlodipine have been added Denies nausea vomiting chest pain or cough   Brief hospital course: HPI on admission, per Dr. Maryjean Ka: "Marilyn Andrews is a 87 y.o. female with medical history significant of prior episodes of diverticulitis.  Patient was in her usual state of health till about 5 days ago when she reports a new onset of constant aching pain in the left lower quadrant.  Since then the patient's pain has become more intense, and has spread generally in the abdomen.  Patient does not report any specific aggravating or relieving factors.  It is associated with nausea and 3 episodes of nonbloody vomiting.  There is no diarrhea.  No trauma is reported no fevers reported.  Patient came to the ER today due to severity of pain and inability to tolerate p.o. diet. " Patient was admitted evening of 02/08/2023 for acute diverticulitis, being treated with IV antibiotics, IV fluids. On clear liquid diet.   Assessment and Plan: * Acute diverticulitis Uncomplicated, however associated with significant nausea and vomiting and inability to tolerate p.o. diet --Continue IV Rocephin, Flagyl --Continue IV hydration, IV antiemetics PRN -- Diet has been advanced to soft diet today --Monitor abdominal exam closely and repeat CT if worsening pain or exam or fevers & would consult general surgery in that case   Hypokalemia K 3.3 on admission, normalized with replacement.   --Monitor BMP, replace PRN --Check Mg level   Erythrocytosis Mild erythrocytosis on admission.  Noted to be chronic. --f/u erythropoietin level --outpatient hematology referral   Chronic pain Patient reports chronic low back and bilateral  knee pain due to arthritis.  Also Acute abdominal pain due to diverticulitis.  On tramadol 50 my once daily at home. --Will schedule Tramadol 50 mg BID for now --ResumeD home Robaxin, hold Zanaflex --Celebrex, acetaminophen for mild pain --Oxycodone for moderate to severe pain --IV morphine iF unable to take PO meds or for breakthrough pain   GERD (gastroesophageal reflux disease) Continue home PPI BID   Essential hypertension-continue amlodipine  Data Reviewed: Report results reviewed by me today showing sodium 139 potassium 4.0   Family Communication: Son, Marilyn Andrews updated by phone    Disposition: Status is inpatient Patient still requiring IV medications and still having symptomatic abdominal pain   Planned Discharge Destination: Home     Time spent: 36 minutes   Physical Exam:   General exam: awake, alert, no acute distress HEENT: moist mucus membranes, hard of hearing  Respiratory system: CTAB, no wheezes, rales or rhonchi, normal respiratory effort. Cardiovascular system: normal S1/S2, RRR, no JVD, murmurs, rubs, gallops, no pedal edema.   Gastrointestinal system: soft, +LLQ tenderness without guarding or rebound, ND, +bowel sounds. Central nervous system: A&O x3. no gross focal neurologic deficits, normal speech Extremities: moves all, no edema, normal tone Skin: dry, intact, normal temperature Psychiatry: normal mood, congruent affect, judgement and insight appear normal    Vitals:   02/10/23 1937 02/11/23 0508 02/11/23 0805 02/11/23 1128  BP: (!) 155/70 (!) 177/85 (!) 167/70 (!) 140/71  Pulse: 75 81 (!) 107   Resp: 20 18 16    Temp: 98.4 F (36.9 C) 97.9 F (36.6 C) 98.7 F (37.1 C)  TempSrc:      SpO2: 93% 98% 96%   Weight:      Height:        Author: Loyce Dys, MD 02/11/2023 3:13 PM  For on call review [email or web address].

## 2023-02-11 NOTE — [place] FL2 (Signed)
MEDICAID FL2 LEVEL OF CARE FORM     IDENTIFICATION  Patient Name: Marilyn Andrews Birthdate: 1935-11-03 Sex: female Admission Date (Current Location): 02/08/2023  [facility] and [place] Number:  Chiropodist and Address:         Provider Number: [id number]  Attending Physician Name and Address:  Loyce Dys, MD  Relative Name and Phone Number:       Current Level of Care: Hospital Recommended Level of Care: Skilled Nursing Facility Prior Approval Number:    Date Approved/Denied:   PASRR Number:    Discharge Plan: SNF    Current Diagnoses: Patient Active Problem List   Diagnosis Date Noted   Hypokalemia 02/09/2023   Chronic pain 02/08/2023   Erythrocytosis 02/08/2023   Generalized weakness 09/17/2020   GERD (gastroesophageal reflux disease) 09/09/2020   Depression 09/09/2020   HTN (hypertension) 03/05/2020   UTI (urinary tract infection) 07/28/2018   Severe recurrent major depression without psychotic features 03/08/2018   Acute diverticulitis 03/07/2018   Diverticulitis 07/01/2016    Orientation RESPIRATION BLADDER Height & Weight     Self, Time, Situation, Place  O2 (2L [place]) Incontinent Weight: 76.6 kg Height:  5\' 3"  (160 cm)  BEHAVIORAL SYMPTOMS/MOOD NEUROLOGICAL BOWEL NUTRITION STATUS      Continent Diet (Soft)  AMBULATORY STATUS COMMUNICATION OF NEEDS Skin   Extensive Assist Verbally Bruising                       Personal Care Assistance Level of Assistance              Functional Limitations Info             SPECIAL CARE FACTORS FREQUENCY  PT (By licensed PT), OT (By licensed OT)                    Contractures Contractures Info: Not present    Additional Factors Info  Code Status, Allergies Code Status Info: Full Allergies Info: Sulfa antibiotics, Penicillin           Current Medications (02/11/2023):  This is the current hospital active medication list Current Facility-Administered Medications   Medication Dose Route Frequency Provider Last Rate Last Admin   acetaminophen (TYLENOL) tablet 650 mg  650 mg Oral Q4H PRN Nolberto Hanlon, MD       amLODipine (NORVASC) tablet 10 mg  10 mg Oral Daily Rosezetta Schlatter T, MD   10 mg at 02/11/23 1128   cefTRIAXone (ROCEPHIN) 1 g in sodium chloride 0.9 % 100 mL IVPB  1 g Intravenous Q24H Nolberto Hanlon, MD   Stopped at 02/10/23 2242   celecoxib (CELEBREX) capsule 200 mg  200 mg Oral BID Nolberto Hanlon, MD   200 mg at 02/10/23 2211   dextrose 5 % in lactated ringers infusion   Intravenous Continuous Nolberto Hanlon, MD 100 mL/hr at 02/11/23 1035 New Bag at 02/11/23 1035   enoxaparin (LOVENOX) injection 40 mg  40 mg Subcutaneous Q24H Nolberto Hanlon, MD   40 mg at 02/11/23 2575   hydrALAZINE (APRESOLINE) injection 10 mg  10 mg Intravenous Q4H PRN Esaw Grandchild A, DO   10 mg at 02/11/23 0516   labetalol (NORMODYNE) injection 10 mg  10 mg Intravenous Q4H PRN Esaw Grandchild A, DO       lactulose (CHRONULAC) 10 GM/15ML solution 20 g  20 g Oral Daily PRN Nolberto Hanlon, MD       LORazepam (ATIVAN) tablet 0.5 mg  0.5 mg Oral BID Mansy, Jan A, MD   0.5 mg at 02/11/23 5670   methocarbamol (ROBAXIN) tablet 500 mg  500 mg Oral Q6H PRN Esaw Grandchild A, DO       metroNIDAZOLE (FLAGYL) IVPB 500 mg  500 mg Intravenous Conchita Paris, MD 100 mL/hr at 02/11/23 0006 500 mg at 02/11/23 0006   morphine (PF) 2 MG/ML injection 2 mg  2 mg Intravenous Q4H PRN Esaw Grandchild A, DO       ondansetron [facility]) injection 4 mg  4 mg Intravenous Q6H PRN Nolberto Hanlon, MD   4 mg at 02/10/23 2202   oxyCODONE (Oxy IR/ROXICODONE) immediate release tablet 5 mg  5 mg Oral Q4H PRN Nolberto Hanlon, MD   5 mg at 02/10/23 2209   pantoprazole (PROTONIX) EC tablet 40 mg  40 mg Oral BID Nolberto Hanlon, MD   40 mg at 02/11/23 1410   promethazine (PHENERGAN) 6.25 mg in sodium chloride 0.9 % 50 mL IVPB  6.25 mg Intravenous Q6H PRN Nolberto Hanlon, MD   Stopped at 02/09/23 1030   rosuvastatin (CRESTOR) tablet 20 mg  20 mg Oral  QHS Nolberto Hanlon, MD   20 mg at 02/10/23 2211   sodium chloride flush (NS) 0.9 % injection 3 mL  3 mL Intravenous Q12H Nolberto Hanlon, MD   3 mL at 02/11/23 0826   traMADol (ULTRAM) tablet 50 mg  50 mg Oral BID Esaw Grandchild A, DO   50 mg at 02/11/23 3013     Discharge Medications: Please see discharge summary for a list of discharge medications.  Relevant Imaging Results:  Relevant Lab Results:   Additional Information SS# [id number]  Chapman Fitch, RN

## 2023-02-11 NOTE — Progress Notes (Signed)
Patient is complaining of bottom pain. Explained to patient that the way to fix this would be to change positions and ambulate. Patient refused saying that she needed medication to fix it and that the nurses have been putting medication on it. RN told patient that RN could do that too but the ultimate fix would be mobility. Patient asked to see PT in a little bit. Said that she did not feel like standing at bedside right now. Patient's pad and gown were slightly wet due to perwick leaking. Pad and gown changed. Perwick replaced. Sacral area assessed with no redness or breakdown present. Patient stated that she did not want the foam replaced due to burning in the area. Patient educated that this would help prevent breakdown. Patient refused and said she needed to rest.

## 2023-02-11 NOTE — Progress Notes (Signed)
Called patient's son to see when he would be by today to visit so home medication could be sent with him. No answer and son did not have voicemail set up. Patient is refusing to let us send medication to pharmacy.

## 2023-02-11 NOTE — TOC Initial Note (Signed)
Transition of Care [facility]) - Initial/Assessment Note    Patient Details  Name: Marilyn Andrews MRN: [id number] Date of Birth: [birth year]  [facility]) CM/SW Contact:    Chapman Fitch, RN Phone Number: 02/11/2023, 12:13 PM  Clinical Narrative:                    Admitted [id number]  Admitted from: home alone  PCP: [facility].  Ex daughter in law transports to appointments  Therapy recommending SNF patient in agreement [facility] Fl[facility] sent for signature Bed search initiated Message sent to [facility] to review :      Patient Goals and CMS Choice            Expected Discharge Plan and Services                                              Prior Living Arrangements/Services                       Activities of Daily Living Home Assistive Devices/Equipment: Environmental consultant (specify type) ADL Screening (condition at time of admission) Patient's cognitive ability adequate to safely complete daily activities?: Yes Is the patient deaf or have difficulty hearing?: Yes Does the patient have difficulty seeing, even when wearing glasses/contacts?: No Does the patient have difficulty concentrating, remembering, or making decisions?: No Patient able to express need for assistance with ADLs?: Yes Does the patient have difficulty dressing or bathing?: Yes Independently performs ADLs?: No Communication: Independent Dressing (OT): Needs assistance Is this a change from baseline?: Pre-admission baseline Grooming: Needs assistance Is this a change from baseline?: Pre-admission baseline Feeding: Independent Bathing: Needs assistance Is this a change from baseline?: Pre-admission baseline Toileting: Needs assistance Is this a change from baseline?: Pre-admission baseline In/Out Bed: Independent Walks in Home: Needs assistance Is this a change from baseline?: Pre-admission baseline Does the patient have difficulty  walking or climbing stairs?: Yes Weakness of Legs: Both Weakness of Arms/Hands: None  Permission Sought/Granted                  Emotional Assessment              Admission diagnosis:  Diverticulitis [K57.92] Patient Active Problem List   Diagnosis Date Noted   Hypokalemia 02/09/2023   Chronic pain 02/08/2023   Erythrocytosis 02/08/2023   Generalized weakness 09/17/2020   GERD (gastroesophageal reflux disease) 09/09/2020   Depression 09/09/2020   HTN (hypertension) 03/05/2020   UTI (urinary tract infection) 07/28/2018   Severe recurrent major depression without psychotic features 03/08/2018   Acute diverticulitis 03/07/2018   Diverticulitis 07/01/2016   PCP:  [facility] Pharmacy:   [facility] [place], [place] - [address] [place] [address] Phone: [phone] Fax: [phone]  [id number] - [address], [place] - [address] [place] [address] Phone: [phone] Fax: [phone]  [facility] [address] [place] - [address] [place] [address] Phone: [phone] Fax: [phone]     Social Determinants of Health (SDOH) Social History: SDOH Screenings   Food Insecurity: No Food Insecurity (02/08/2023)  Housing: Low Risk  (02/08/2023)  Transportation Needs: No Transportation Needs (02/08/2023)  Utilities: Not At Risk (02/08/2023)  Tobacco Use: Low Risk  (  02/08/2023)   SDOH Interventions: Housing Interventions: Intervention Not Indicated   Readmission Risk Interventions     No data to display

## 2023-02-11 NOTE — Progress Notes (Signed)
Patient refused morning mobility. Said she did not feel like walking because she was tired and needed her morning medications to start working before walking. Patient educated on mobility importance.

## 2023-02-11 NOTE — Progress Notes (Signed)
PT Cancellation Note  Patient Details Name: Marilyn Andrews MRN: [id number] DOB: [birth year]   Cancelled Treatment:    Reason Eval/Treat Not Completed: Patient declined, no reason specified;Other (comment) Patient told RN that her bottom is sore and RN told her she needed to change position. Patient was apparently in agreement for PT to come back to try to work with her. Up on re-attempt, patient states "I will have to wait a while, I got pain medicine that makes me dizzy." When asked what time I should come back she was unable to tell me. Discussed with RN. Will continue to attempt to work with patient, however she has a history of refusing mobility on multiple admissions.     Sacheen Arrasmith 02/11/2023, 2:44 PM

## 2023-02-11 NOTE — Progress Notes (Signed)
PT Cancellation Note  Patient Details Name: Marilyn Andrews MRN: [id number] DOB: 16-Sep-1936   Cancelled Treatment:    Reason Eval/Treat Not Completed: Patient declined, no reason specified. Patient declines and closes eyes when PT attempts to encourage her to participate. Will continue to attempt as able.    Langford Carias 02/11/2023, 10:54 AM

## 2023-02-11 NOTE — Progress Notes (Signed)
Patient refused mobility. Said she was in pain. Pain medications provided. Will continue to assess. Education provided.

## 2023-02-12 DIAGNOSIS — K5792 Diverticulitis of intestine, part unspecified, without perforation or abscess without bleeding: Secondary | ICD-10-CM | POA: Diagnosis not present

## 2023-02-12 LAB — BASIC METABOLIC PANEL
Anion gap: 6 (ref 5–15)
BUN: 8 mg/dL (ref 8–23)
CO2: 27 mmol/L (ref 22–32)
Calcium: 8.1 mg/dL — ABNORMAL LOW (ref 8.9–10.3)
Chloride: 106 mmol/L (ref 98–111)
Creatinine, Ser: 0.95 mg/dL (ref 0.44–1.00)
GFR, Estimated: 59 mL/min — ABNORMAL LOW (ref >60)
Glucose, Bld: 131 mg/dL — ABNORMAL HIGH (ref 70–99)
Potassium: 4.2 mmol/L (ref 3.5–5.1)
Sodium: 139 mmol/L (ref 135–145)

## 2023-02-12 LAB — CBC WITH DIFFERENTIAL/PLATELET
Abs Immature Granulocytes: 0.07 10*3/uL (ref 0.00–0.07)
Basophils Absolute: 0.1 10*3/uL (ref 0.0–0.1)
Basophils Relative: 1 %
Eosinophils Absolute: 0.2 10*3/uL (ref 0.0–0.5)
Eosinophils Relative: 2 %
HCT: 40.1 % (ref 36.0–46.0)
Hemoglobin: 12.9 g/dL (ref 12.0–15.0)
Immature Granulocytes: 1 %
Lymphocytes Relative: 11 %
Lymphs Abs: 1 10*3/uL (ref 0.7–4.0)
MCH: 28.6 pg (ref 26.0–34.0)
MCHC: 32.2 g/dL (ref 30.0–36.0)
MCV: 88.9 fL (ref 80.0–100.0)
Monocytes Absolute: 0.6 10*3/uL (ref 0.1–1.0)
Monocytes Relative: 6 %
Neutro Abs: 7 10*3/uL (ref 1.7–7.7)
Neutrophils Relative %: 79 %
Platelets: 373 10*3/uL (ref 150–400)
RBC: 4.51 MIL/uL (ref 3.87–5.11)
RDW: 14.4 % (ref 11.5–15.5)
WBC: 8.8 10*3/uL (ref 4.0–10.5)
nRBC: 0 % (ref 0.0–0.2)

## 2023-02-12 MED ORDER — AMOXICILLIN-POT CLAVULANATE 875-125 MG PO TABS
1.0000 | ORAL_TABLET | Freq: Two times a day (BID) | ORAL | Status: DC
  Administered 2023-02-12 – 2023-02-18 (×13): 1 via ORAL
  Filled 2023-02-12 (×13): qty 1

## 2023-02-12 NOTE — Care Management Important Message (Signed)
Important Message  Patient Details  Name: Marilyn Andrews MRN: [id number] Date of Birth: Dec 10, 1935   Medicare Important Message Given:  N/A - LOS <3 / Initial given by admissions     [name] 02/12/2023, 10:52 AM

## 2023-02-12 NOTE — Progress Notes (Signed)
Progress Note   Patient: Marilyn Andrews YPP:[id number] DOB: 08/06/1936 DOA: 02/08/2023     2 DOS: the patient was seen and examined on 02/12/2023    Subjective:    Patient seen and examined at bedside this morning Abdominal pain continues to improve Blood pressure much better now She tells me she will rather want to be discharged home however she is not doing so well with physical therapy and requires placement in a skilled nursing facility Denies nausea vomiting chest pain or cough   Brief hospital course: HPI on admission, per Dr. Maryjean Ka: "Marilyn Andrews is a 87 y.o. female with medical history significant of prior episodes of diverticulitis.  Patient was in her usual state of health till about 5 days ago when she reports a new onset of constant aching pain in the left lower quadrant.  Since then the patient's pain has become more intense, and has spread generally in the abdomen.  Patient does not report any specific aggravating or relieving factors.  It is associated with nausea and 3 episodes of nonbloody vomiting.  There is no diarrhea.  No trauma is reported no fevers reported.  Patient came to the ER today due to severity of pain and inability to tolerate p.o. diet. " Patient was admitted evening of 02/08/2023 for acute diverticulitis, being treated with IV antibiotics, IV fluids. On clear liquid diet.   Assessment and Plan: * Acute diverticulitis Uncomplicated, however associated with significant nausea and vomiting and inability to tolerate p.o. diet --Continue IV Rocephin, Flagyl --Continue IV hydration, IV antiemetics PRN -- Continue soft diet --Monitor abdominal exam closely and repeat CT if worsening pain or exam or fevers & would consult general surgery in that case   Hypokalemia K 3.3 on admission, normalized with replacement.   --Monitor BMP, replace PRN --Check Mg level   Erythrocytosis Mild erythrocytosis on admission.  Noted to be chronic. --f/u erythropoietin  level --outpatient hematology referral   Chronic pain Patient reports chronic low back and bilateral knee pain due to arthritis.  Also Acute abdominal pain due to diverticulitis.  On tramadol 50 my once daily at home. --Will schedule Tramadol 50 mg BID for now --ResumeD home Robaxin, hold Zanaflex --Celebrex, acetaminophen for mild pain --Oxycodone for moderate to severe pain --IV morphine iF unable to take PO meds or for breakthrough pain   GERD (gastroesophageal reflux disease) Continue home PPI BID   Essential hypertension-continue amlodipine   Data Reviewed: Laboratory results reviewed by me today showing sodium 139 potassium 4.2 with creatinine 0.9   Family Communication: Son, Lelon Huh was updated over the phone    Disposition: Status is inpatient Patient still requiring IV medications and still having symptomatic abdominal pain    Planned Discharge Destination: Home     Time spent: 38 minutes   Physical Exam:   General exam: awake, alert, no acute distress HEENT: moist mucus membranes, hard of hearing  Respiratory system: CTAB, no wheezes, rales or rhonchi, normal respiratory effort. Cardiovascular system: normal S1/S2, RRR, no JVD, murmurs, rubs, gallops, no pedal edema.   Gastrointestinal system: soft, +LLQ tenderness without guarding or rebound, ND, +bowel sounds. Central nervous system: A&O x3. no gross focal neurologic deficits, normal speech Extremities: moves all, no edema, normal tone Skin: dry, intact, normal temperature Psychiatry: normal mood, congruent affect, judgement and insight appear normal      Vitals:   02/11/23 1601 02/11/23 1916 02/12/23 0730 02/12/23 1530  BP: (!) 114/50 (!) 135/56 (!) 157/75 (!) 141/58  Pulse:  99 90 88 84  Resp: Temp: 98.1 F (36.7 C) 98.7 F (37.1 C) 98.1 F (36.7 C) 98.3 F (36.8 C)  TempSrc: Oral Oral Oral Oral  SpO2: 96% 94% 97%   Weight:      Height:        Author: Loyce Dys, MD 02/12/2023  5:15 PM  For on call review [email or web address].

## 2023-02-12 NOTE — TOC Progression Note (Signed)
Transition of Care [facility]) - Progression Note    Patient Details  Name: Marilyn Andrews MRN: [id number] Date of Birth: [birth year]  [facility]) CM/SW Contact  Margarito Liner, LCSW Phone Number: 02/12/2023, 12:09 PM  Clinical Narrative: Gave patient bed offers including [facility] which was her first preference. Patient is unsure if she wants home health or SNF placement. Discussed concerns with weakness, sons working, Catering manager. Will follow up once she has had time to think about it.    Expected Discharge Plan and Services                                               Social Determinants of Health (SDOH) Interventions SDOH Screenings   Food Insecurity: No Food Insecurity (02/08/2023)  Housing: Low Risk  (02/08/2023)  Transportation Needs: No Transportation Needs (02/08/2023)  Utilities: Not At Risk (02/08/2023)  Tobacco Use: Low Risk  (02/08/2023)    Readmission Risk Interventions     No data to display

## 2023-02-12 NOTE — Progress Notes (Signed)
Occupational Therapy Treatment Patient Details Name: Marilyn Andrews MRN: [id number] DOB: 02/25/1936 Today's Date: 02/12/2023   History of present illness Marilyn Andrews is a 87 y.o. female with medical history significant of prior episodes of diverticulitis.  Patient was in her usual state of health until about 5 days ago when she reports a new onset of constant aching pain in the left lower quadrant.   OT comments  Upon entering the room, pt supine in bed and agreeable to therapeutic intervention. Pt dons B socks while seated in bed with HOB elevated and use of figure four position with set up A. Supine >sit with min guard and pt stands to transfer to [facility] with min A. Pt having BM and able to wipe some while seated but needing assistance managing clothing and for thoroughness with hygiene. Pt is very limited as far as endurance with functional activities and this is her first time out of bed in several days. Pt continues to benefit from OT intervention.    Recommendations for follow up therapy are one component of a multi-disciplinary discharge planning process, led by the attending physician.  Recommendations may be updated based on patient status, additional functional criteria and insurance authorization.    Assistance Recommended at Discharge Frequent or constant Supervision/Assistance  Patient can return home with the following  A lot of help with bathing/dressing/bathroom;Assistance with cooking/housework;Assist for transportation;Help with stairs or ramp for entrance;A lot of help with walking and/or transfers   Equipment Recommendations  BSC/3in1       Precautions / Restrictions Precautions Precautions: Fall Restrictions Weight Bearing Restrictions: No       Mobility Bed Mobility Overal bed mobility: Needs Assistance Bed Mobility: Sit to Supine, Supine to Sit     Supine to sit: Min guard Sit to supine: Min guard        Transfers Overall transfer level: Needs  assistance Equipment used: Rolling walker (2 wheels) Transfers: Sit to/from Stand Sit to Stand: Min assist, Min guard                 Balance Overall balance assessment: Needs assistance Sitting-balance support: Feet unsupported, Bilateral upper extremity supported Sitting balance-Leahy Scale: Fair     Standing balance support: Bilateral upper extremity supported, Reliant on assistive device for balance, During functional activity Standing balance-Leahy Scale: Fair                             ADL either performed or assessed with clinical judgement   ADL Overall ADL's : Needs assistance/impaired                     Lower Body Dressing: Set up Lower Body Dressing Details (indicate cue type and reason): donned B socks with figure four position while seated in bed with HOB elevated     Toileting- Clothing Manipulation and Hygiene: Moderate assistance Toileting - Clothing Manipulation Details (indicate cue type and reason): assistance for clothing management and she was able to perform hygiene while seated with set up A but some assistance needed once standing for thoroughness            Extremity/Trunk Assessment Upper Extremity Assessment Upper Extremity Assessment: Generalized weakness   Lower Extremity Assessment Lower Extremity Assessment: Generalized weakness        Vision Patient Visual Report: No change from baseline            Cognition Arousal/Alertness: Awake/alert Behavior During  Therapy: WFL for tasks assessed/performed Overall Cognitive Status: No family/caregiver present to determine baseline cognitive functioning                                 General Comments: patient with mild dealy with following commands. she reports being hard of hearing and some commands and discussion had to be repeated                   Pertinent Vitals/ Pain       Pain Assessment Pain Assessment: Faces Faces Pain Scale: Hurts  little more Pain Location: abdomen, generalized joint pain from arthritis Pain Descriptors / Indicators: Aching, Discomfort Pain Intervention(s): Limited activity within patient's tolerance, Monitored during session, Repositioned         Frequency  Min 1X/week        Progress Toward Goals  OT Goals(current goals can now be found in the care plan section)  Progress towards OT goals: Progressing toward goals     Plan Discharge plan remains appropriate;Frequency remains appropriate       AM-PAC OT "6 Clicks" Daily Activity     Outcome Measure   Help from another person eating meals?: A Little Help from another person taking care of personal grooming?: A Little Help from another person toileting, which includes using toliet, bedpan, or urinal?: A Lot Help from another person bathing (including washing, rinsing, drying)?: A Lot Help from another person to put on and taking off regular upper body clothing?: A Little Help from another person to put on and taking off regular lower body clothing?: A Lot 6 Click Score: 15    End of Session Equipment Utilized During Treatment: Rolling walker (2 wheels);Other (comment) (BSC)  OT Visit Diagnosis: Muscle weakness (generalized) (M62.81)   Activity Tolerance Patient limited by pain;Patient limited by fatigue   Patient Left in bed;with bed alarm set;with call bell/phone within reach   Nurse Communication Mobility status        Time: 0981-1914 OT Time Calculation (min): 14 min  Charges: OT General Charges $OT Visit: 1 Visit OT Treatments $Self Care/Home Management : 8-22 mins  [name], MS, OTR/L , CBIS ascom [phone]  02/12/23, 12:20 PM

## 2023-02-12 NOTE — Progress Notes (Signed)
Physical Therapy Treatment Patient Details Name: [name] MRN: [id number] DOB: [birth year] Today's Date: 02/12/2023   History of Present Illness Marilyn Andrews is a 87 y.o. female with medical history significant of prior episodes of diverticulitis.  Patient was in her usual state of health until about 5 days ago when she reports a new onset of constant aching pain in the left lower quadrant.    PT Comments    The patient was agreeable to PT today. She reports dizziness with upright activity she feels is related to pain medication. Increased time and effort required with all mobility with activity tolerance limited by fatigue. Standing tolerance of around 3 minutes with rolling walker for support. Patient declined to attempt ambulation at this time due to fatigue and dizziness. Recommend to continue PT to maximize independence and facilitate return to prior level of function. The patient lives alone with her dog. Anticipate the need for intermittent supervision/assistance at discharge.    Recommendations for follow up therapy are one component of a multi-disciplinary discharge planning process, led by the attending physician.  Recommendations may be updated based on patient status, additional functional criteria and insurance authorization.  Follow Up Recommendations  Can patient physically be transported by private vehicle: No    Assistance Recommended at Discharge Intermittent Supervision/Assistance  Patient can return home with the following Help with stairs or ramp for entrance;Assist for transportation;A little help with walking and/or transfers;A little help with bathing/dressing/bathroom;Assistance with cooking/housework   Equipment Recommendations  None recommended by PT    Recommendations for Other Services       Precautions / Restrictions Precautions Precautions: Fall Restrictions Weight Bearing Restrictions: No     Mobility  Bed Mobility Overal bed mobility: Needs  Assistance Bed Mobility: Sit to Supine       Sit to supine: Min guard   General bed mobility comments: increased time and effort required with bed mobility    Transfers Overall transfer level: Needs assistance Equipment used: Rolling walker (2 wheels) Transfers: Sit to/from Stand Sit to Stand: Min guard           General transfer comment: verbal cues for safety. cues for hand placement. encouraged patient to use rolling walker for safety with transfers    Ambulation/Gait             Pre-gait activities: standing tolerance of 3 minutes. RW used for UE support. dizziness reported with standing General Gait Details: patient declined walking due to dizziness from pain medication   Stairs             Wheelchair Mobility    Modified Rankin (Stroke Patients Only)       Balance Overall balance assessment: Needs assistance Sitting-balance support: Feet unsupported, Bilateral upper extremity supported Sitting balance-Leahy Scale: Fair     Standing balance support: Bilateral upper extremity supported, Reliant on assistive device for balance Standing balance-Leahy Scale: Fair                              Cognition Arousal/Alertness: Awake/alert Behavior During Therapy: WFL for tasks assessed/performed Overall Cognitive Status: No family/caregiver present to determine baseline cognitive functioning                                 General Comments: patient with mild dealy with following commands. she reports being hard of hearing and some commands and  discussion had to be repeated        Exercises      General Comments        Pertinent Vitals/Pain Pain Assessment Pain Assessment: Faces Faces Pain Scale: Hurts little more Pain Location: abdomen, generalized joint pain from arthritis Pain Descriptors / Indicators: Aching, Discomfort Pain Intervention(s): Limited activity within patient's tolerance, Monitored during session,  Repositioned    Home Living                          Prior Function            PT Goals (current goals can now be found in the care plan section) Acute Rehab PT Goals Patient Stated Goal: to be able to walk and go home to take care of her dog PT Goal Formulation: With patient Time For Goal Achievement: 02/24/23 Potential to Achieve Goals: Fair Progress towards PT goals: Progressing toward goals    Frequency    Min 2X/week      PT Plan Current plan remains appropriate    Co-evaluation              AM-PAC PT "6 Clicks" Mobility   Outcome Measure  Help needed turning from your back to your side while in a flat bed without using bedrails?: A Little Help needed moving from lying on your back to sitting on the side of a flat bed without using bedrails?: A Little Help needed moving to and from a bed to a chair (including a wheelchair)?: A Little Help needed standing up from a chair using your arms (e.g., wheelchair or bedside chair)?: A Little Help needed to walk in hospital room?: A Lot Help needed climbing 3-5 steps with a railing? : Total 6 Click Score: 15    End of Session Equipment Utilized During Treatment: Oxygen Activity Tolerance: Patient limited by fatigue Patient left: in bed;with call bell/phone within reach;with bed alarm set   PT Visit Diagnosis: Muscle weakness (generalized) (M62.81);Other abnormalities of gait and mobility (R26.89);Difficulty in walking, not elsewhere classified (R26.2)     Time: 7948-0165 PT Time Calculation (min) (ACUTE ONLY): 15 min  Charges:  $Therapeutic Activity: 8-22 mins                     Donna Bernard, PT, MPT    Ina Homes 02/12/2023, 11:42 AM

## 2023-02-13 ENCOUNTER — Inpatient Hospital Stay: Payer: Medicare HMO

## 2023-02-13 DIAGNOSIS — K5792 Diverticulitis of intestine, part unspecified, without perforation or abscess without bleeding: Secondary | ICD-10-CM | POA: Diagnosis not present

## 2023-02-13 LAB — CBC WITH DIFFERENTIAL/PLATELET
Abs Immature Granulocytes: 0.05 10*3/uL (ref 0.00–0.07)
Basophils Absolute: 0.1 10*3/uL (ref 0.0–0.1)
Basophils Relative: 1 %
Eosinophils Absolute: 0.2 10*3/uL (ref 0.0–0.5)
Eosinophils Relative: 2 %
HCT: 41 % (ref 36.0–46.0)
Hemoglobin: 13.1 g/dL (ref 12.0–15.0)
Immature Granulocytes: 1 %
Lymphocytes Relative: 10 %
Lymphs Abs: 0.9 10*3/uL (ref 0.7–4.0)
MCH: 28.4 pg (ref 26.0–34.0)
MCHC: 32 g/dL (ref 30.0–36.0)
MCV: 88.9 fL (ref 80.0–100.0)
Monocytes Absolute: 0.5 10*3/uL (ref 0.1–1.0)
Monocytes Relative: 6 %
Neutro Abs: 7.2 10*3/uL (ref 1.7–7.7)
Neutrophils Relative %: 80 %
Platelets: 394 10*3/uL (ref 150–400)
RBC: 4.61 MIL/uL (ref 3.87–5.11)
RDW: 14.3 % (ref 11.5–15.5)
WBC: 8.9 10*3/uL (ref 4.0–10.5)
nRBC: 0 % (ref 0.0–0.2)

## 2023-02-13 LAB — BASIC METABOLIC PANEL
Anion gap: 5 (ref 5–15)
BUN: 9 mg/dL (ref 8–23)
CO2: 25 mmol/L (ref 22–32)
Calcium: 8.2 mg/dL — ABNORMAL LOW (ref 8.9–10.3)
Chloride: 108 mmol/L (ref 98–111)
Creatinine, Ser: 0.84 mg/dL (ref 0.44–1.00)
GFR, Estimated: >60 mL/min (ref >60)
Glucose, Bld: 113 mg/dL — ABNORMAL HIGH (ref 70–99)
Potassium: 4.2 mmol/L (ref 3.5–5.1)
Sodium: 138 mmol/L (ref 135–145)

## 2023-02-13 NOTE — Plan of Care (Signed)
  Problem: Pain Managment: Goal: General experience of comfort will improve Outcome: Progressing   Problem: Safety: Goal: Ability to remain free from injury will improve Outcome: Progressing   Problem: Skin Integrity: Goal: Risk for impaired skin integrity will decrease Outcome: Progressing   

## 2023-02-13 NOTE — Progress Notes (Signed)
Progress Note   Patient: Marilyn Andrews WJX:[id number] DOB: July 31, 1936 DOA: 02/08/2023     3 DOS: the patient was seen and examined on 02/13/2023     Subjective:    Patient seen and examined at bedside this morning She tells me her right lower abdominal pain is resolved in the left lower abdominal pain is also getting better She continues to appear lethargic Denies nausea vomiting chest pain or cough   Brief hospital course: HPI on admission, per Dr. Maryjean Ka: "Marilyn Andrews is a 87 y.o. female with medical history significant of prior episodes of diverticulitis.  Patient was in her usual state of health till about 5 days ago when she reports a new onset of constant aching pain in the left lower quadrant.  Since then the patient's pain has become more intense, and has spread generally in the abdomen.  Patient does not report any specific aggravating or relieving factors.  It is associated with nausea and 3 episodes of nonbloody vomiting.  There is no diarrhea.  No trauma is reported no fevers reported.  Patient came to the ER today due to severity of pain and inability to tolerate p.o. diet. " Patient was admitted evening of 02/08/2023 for acute diverticulitis, being treated with IV antibiotics, IV fluids.   Assessment and Plan: * Acute diverticulitis Uncomplicated, however associated with significant nausea and vomiting and inability to tolerate p.o. diet --Continue IV Rocephin, Flagyl --Continue IV hydration, IV antiemetics PRN -- Continue soft diet --Monitor abdominal exam closely and repeat CT if worsening pain or exam or fevers & would consult general surgery in that case   Left upper extremity swelling Doppler ultrasound ruled out DVT  Hypokalemia K 3.3 on admission, normalized with replacement.   --Monitor BMP, replace PRN --Check Mg level   Erythrocytosis Mild erythrocytosis on admission.  Noted to be chronic. --f/u erythropoietin level --outpatient hematology referral   Chronic  pain Patient reports chronic low back and bilateral knee pain due to arthritis.  Also Acute abdominal pain due to diverticulitis.  On tramadol 50 my once daily at home. --Will schedule Tramadol 50 mg BID for now --Resumed home Robaxin, hold Zanaflex --acetaminophen for mild pain Celebrex discontinued --Oxycodone for moderate to severe pain    GERD (gastroesophageal reflux disease) Continue home PPI BID   Essential hypertension-continue amlodipine   Data Reviewed: Laboratory results reviewed by me today showing sodium 139 potassium 4.2 with creatinine 0.9   Family Communication: Son, Lelon Huh was updated over the phone by me   Disposition: Status is inpatient Patient still requiring IV medications and still having symptomatic abdominal pain    Planned Discharge Destination: Home     Time spent: 40 minutes   Physical Exam:   General exam: awake, alert, no acute distress HEENT: moist mucus membranes, hard of hearing  Respiratory system: CTAB, no wheezes, rales or rhonchi, normal respiratory effort. Cardiovascular system: normal S1/S2, RRR, no JVD, murmurs, rubs, gallops, no pedal edema.   Gastrointestinal system: soft, +LLQ tenderness without guarding or rebound, ND, +bowel sounds. Central nervous system: A&O x3. no gross focal neurologic deficits, normal speech Extremities: moves all, no edema, normal tone Skin: dry, intact, normal temperature Psychiatry: normal mood, congruent affect, judgement and insight appear normal      Vitals:   02/12/23 2032 02/13/23 0656 02/13/23 0836 02/13/23 0844  BP: (!) 150/67 (!) 147/57  (!) 154/65  Pulse: 75 90  78  Resp: Temp: 97.7 F (36.5 C) 98.4  F (36.9 C)  98.1 F (36.7 C)  TempSrc: Oral     SpO2: 95% 94%    Weight:      Height:        Author: Loyce Dys, MD 02/13/2023 2:02 PM  For on call review [email or web address].

## 2023-02-14 ENCOUNTER — Inpatient Hospital Stay: Payer: Medicare HMO

## 2023-02-14 DIAGNOSIS — K5792 Diverticulitis of intestine, part unspecified, without perforation or abscess without bleeding: Secondary | ICD-10-CM | POA: Diagnosis not present

## 2023-02-14 MED ORDER — IOHEXOL 9 MG/ML PO SOLN
500.0000 mL | ORAL | Status: AC
  Administered 2023-02-14 (×2): 500 mL via ORAL

## 2023-02-14 MED ORDER — IOHEXOL 300 MG/ML  SOLN
100.0000 mL | Freq: Once | INTRAMUSCULAR | Status: AC | PRN
  Administered 2023-02-14: 100 mL via INTRAVENOUS

## 2023-02-14 NOTE — Progress Notes (Signed)
Progress Note   Patient: Marilyn Andrews ZOX:[id number] DOB: 21-May-1936 DOA: 02/08/2023     4 DOS: the patient was seen and examined on 02/14/2023    Subjective:    Patient seen and examined at bedside this morning Abdominal CT scan was obtained that showed improvement in uncomplicated diverticulitis She continues to appear lethargic Denies nausea vomiting chest pain or cough   Brief hospital course: HPI on admission, per Dr. Maryjean Ka: "Marilyn Andrews is a 87 y.o. female with medical history significant of prior episodes of diverticulitis.  Patient was in her usual state of health till about 5 days ago when she reports a new onset of constant aching pain in the left lower quadrant.  Since then the patient's pain has become more intense, and has spread generally in the abdomen.  Patient does not report any specific aggravating or relieving factors.  It is associated with nausea and 3 episodes of nonbloody vomiting.  There is no diarrhea.  No trauma is reported no fevers reported.  Patient came to the ER today due to severity of pain and inability to tolerate p.o. diet. " Patient was admitted evening of 02/08/2023 for acute diverticulitis, being treated with IV antibiotics, IV fluids.   Assessment and Plan: * Acute diverticulitis of sigmoid colon Uncomplicated, however associated with significant nausea and vomiting and inability to tolerate p.o. diet -- Antibiotics switched from IV to Augmentin -- IV antiemetics PRN -- Continue soft diet -- Repeat CT scan of the abdomen showed some improvement with no complication or diverticulitis Patient awaiting skilled nursing facility placement    Left upper extremity swelling Doppler ultrasound ruled out DVT   Hypokalemia K 3.3 on admission, normalized with replacement.   --Monitor BMP, replace PRN --Check Mg level   Erythrocytosis Mild erythrocytosis on admission.  Noted to be chronic. --f/u erythropoietin level --outpatient hematology referral    Chronic pain Patient reports chronic low back and bilateral knee pain due to arthritis.  Also Acute abdominal pain due to diverticulitis.  On tramadol 50 my once daily at home. --Will schedule Tramadol 50 mg BID for now --Resumed home Robaxin, hold Zanaflex --acetaminophen for mild pain Celebrex discontinued --Oxycodone for moderate to severe pain     GERD (gastroesophageal reflux disease) Continue home PPI BID   Essential hypertension-continue amlodipine   Data Reviewed:  Laboratory results today   Family Communication: Son, Marilyn Andrews was updated over the phone by me   Disposition: Status is inpatient Patient still requiring IV medications and still having symptomatic abdominal pain    Planned Discharge Destination: Home     Time spent: 35 minutes   Physical Exam:   General exam: awake, alert, no acute distress HEENT: moist mucus membranes, hard of hearing  Respiratory system: CTAB, no wheezes, rales or rhonchi, normal respiratory effort. Cardiovascular system: normal S1/S2, RRR, no JVD, murmurs, rubs, gallops, no pedal edema.   Gastrointestinal system: soft, +LLQ tenderness without guarding or rebound, ND, +bowel sounds. Central nervous system: A&O x3. no gross focal neurologic deficits, normal speech Extremities: moves all, no edema, normal tone Skin: dry, intact, normal temperature Psychiatry: normal mood, congruent affect, judgement and insight appear normal       Vitals:   02/13/23 1600 02/13/23 2300 02/14/23 0528 02/14/23 0734  BP:  (!) 158/60 (!) 143/67 (!) 148/69  Pulse:  79 84 73  Resp: Temp: 97.9 F (36.6 C) 97.7 F (36.5 C) 98.1 F (36.7 C) 98.1 F (36.7 C)  TempSrc:  SpO2:  95% 93% 94%  Weight:      Height:         Author: Loyce Dys, MD 02/14/2023 3:35 PM  For on call review [email or web address].

## 2023-02-15 DIAGNOSIS — K5792 Diverticulitis of intestine, part unspecified, without perforation or abscess without bleeding: Secondary | ICD-10-CM | POA: Diagnosis not present

## 2023-02-15 LAB — CREATININE, SERUM
Creatinine, Ser: 0.77 mg/dL (ref 0.44–1.00)
GFR, Estimated: >60 mL/min (ref >60)

## 2023-02-15 NOTE — Progress Notes (Signed)
Progress Note   Patient: Marilyn Andrews [id number] DOB: February 02, 1936 DOA: 02/08/2023     5 DOS: the patient was seen and examined on 02/15/2023     Subjective:    Patient seen and examined at bedside this morning Repeat Abdominal CT scan showed improvement in uncomplicated diverticulitis She continues to appear lethargic Awaiting placement in rehab/SNF Denies nausea vomiting chest pain or cough   Brief hospital course: HPI on admission, per Dr. Maryjean Ka: "Marilyn Andrews is a 87 y.o. female with medical history significant of prior episodes of diverticulitis.  Patient was in her usual state of health till about 5 days ago when she reports a new onset of Marilyn aching pain in the left lower quadrant.  Since then the patient's pain has become more intense, and has spread generally in the abdomen.  Patient does not report any specific aggravating or relieving factors.  It is associated with nausea and 3 episodes of nonbloody vomiting.  There is no diarrhea.  No trauma is reported no fevers reported.  Patient came to the ER today due to severity of pain and inability to tolerate p.o. diet. " Patient was admitted evening of 02/08/2023 for acute diverticulitis, being treated with IV antibiotics, IV fluids.   Assessment and Plan: * Acute diverticulitis of sigmoid colon Uncomplicated, however associated with significant nausea and vomiting and inability to tolerate p.o. diet -- Antibiotics switched from IV to Augmentin -- IV antiemetics PRN -- Continue soft diet -- Repeat CT scan of the abdomen showed some improvement with no complication or diverticulitis Patient awaiting skilled nursing facility placement     Left upper extremity swelling Doppler ultrasound ruled out DVT   Hypokalemia K 3.3 on admission, normalized with replacement.   --Monitor BMP, replace PRN   Erythrocytosis Mild erythrocytosis on admission.  Noted to be chronic. --f/u erythropoietin level --outpatient hematology  referral   Chronic pain Patient reports chronic low back and bilateral knee pain due to arthritis.  Also Acute abdominal pain due to diverticulitis.  On tramadol 50 my once daily at home. -- Continue tramadol 50 mg BID for now --Resumed home Robaxin, hold Zanaflex --acetaminophen for mild pain --Oxycodone for moderate to severe pain     GERD (gastroesophageal reflux disease) Continue home PPI BID   Essential hypertension-continue amlodipine   Data Reviewed: Reviewed patient's laboratory results today showing creatinine of 0.77   Family Communication: Son, Lelon Huh was updated over the phone by me   Disposition: Status is inpatient Patient still requiring IV medications and still having symptomatic abdominal pain    Planned Discharge Destination: Home     Time spent: 38 minutes   Physical Exam:   General exam: awake, alert, no acute distress HEENT: moist mucus membranes, hard of hearing  Respiratory system: CTAB, no wheezes, rales or rhonchi, normal respiratory effort. Cardiovascular system: normal S1/S2, RRR, no JVD, murmurs, rubs, gallops, no pedal edema.   Gastrointestinal system: soft, +LLQ tenderness without guarding or rebound, ND, +bowel sounds. Central nervous system: A&O x3. no gross focal neurologic deficits, normal speech Extremities: moves all, no edema, normal tone Skin: dry, intact, normal temperature Psychiatry: normal mood, congruent affect, judgement and insight appear normal     Vitals:   02/14/23 1552 02/14/23 1921 02/15/23 0519 02/15/23 0739  BP: (!) 149/63 136/60 125/63 (!) 141/68  Pulse: 85 94 85 83  Resp: (!) 24 20 16 17   Temp: 98.3 F (36.8 C) (!) 97.3 F (36.3 C) (!) 97.2 F (36.2 C) 98.2 F (  36.8 C)  TempSrc:  Oral Oral   SpO2: 92% 93% 92% 96%  Weight:      Height:        Author: Loyce Dys, MD 02/15/2023 3:47 PM  For on call review [email or web address].

## 2023-02-15 NOTE — Care Management Important Message (Signed)
Important Message  Patient Details  Name: Marilyn Andrews MRN: [id number] Date of Birth: [birth year]   Medicare Important Message Given:  Yes     [name] 02/15/2023, 12:00 PM

## 2023-02-15 NOTE — Progress Notes (Signed)
Mobility Specialist - Progress Note   02/15/23 1500  Mobility  Range of Motion/Exercises Right leg;Left leg;Passive;Active  $Mobility charge 1 Mobility     Pt lying in bed upon arrival, utilizing 2L. Pt participated in bed-level therex, reports arthritic pain in BLE with L > R. Sit ups x5 in bed for core engagement. Engaged in ADL activities with set-up to minA for hair combing, face washing, and gown change. Fatigued after activity. Pt left in bed with alarm set, needs in reach.    Marilyn Andrews Mobility Specialist 02/15/23, 3:36 PM

## 2023-02-15 NOTE — Progress Notes (Signed)
Physical Therapy Treatment Patient Details Name: Marilyn Andrews MRN: [id number] DOB: 02-Nov-1936 Today's Date: 02/15/2023   History of Present Illness Marilyn Andrews is a 87 y.o. female with medical history significant of prior episodes of diverticulitis.  Patient was in her usual state of health until about 5 days ago when she reports a new onset of constant aching pain in the left lower quadrant.    PT Comments    Pt agreeable to PT session this date. Able to practice sit<>stands in addition to mini squats at beside x 5 reps. Progressed to taking small side steps at EOB, however unable to ambulate away from bed at this time. Pt limited by nausea onset, called RN for meds. Pt reports she has just finished supine there-ex with mobility specialist. Will continue to progress as able.   Recommendations for follow up therapy are one component of a multi-disciplinary discharge planning process, led by the attending physician.  Recommendations may be updated based on patient status, additional functional criteria and insurance authorization.  Follow Up Recommendations  Can patient physically be transported by private vehicle: No    Assistance Recommended at Discharge Intermittent Supervision/Assistance  Patient can return home with the following Help with stairs or ramp for entrance;Assist for transportation;A little help with walking and/or transfers;A little help with bathing/dressing/bathroom;Assistance with cooking/housework   Equipment Recommendations  None recommended by PT    Recommendations for Other Services       Precautions / Restrictions Precautions Precautions: Fall Restrictions Weight Bearing Restrictions: No     Mobility  Bed Mobility Overal bed mobility: Needs Assistance Bed Mobility: Supine to Sit, Sit to Supine     Supine to sit: Min guard Sit to supine: Min assist   General bed mobility comments: needs assist for B LE management and uses railings for assist. Once  seated at EOB, upright posture noted.    Transfers Overall transfer level: Needs assistance Equipment used: Rolling walker (2 wheels) Transfers: Sit to/from Stand Sit to Stand: Min assist           General transfer comment: Pt pulls up on RW and is very fearful of falls. Once standing, slight post bias, however able to self correct with cues    Ambulation/Gait Ambulation/Gait assistance: Min assist Gait Distance (Feet): 4 Feet Assistive device: Rolling walker (2 wheels) Gait Pattern/deviations: Step-to pattern       General Gait Details: able to side step at EOB, however is unable to take steps away from bed at this time. Very effortful and reports nausea. All mobility performed on 2L of O2 at this time. Seated rest break 1/2 way secondary to fatigue   Stairs             Wheelchair Mobility    Modified Rankin (Stroke Patients Only)       Balance Overall balance assessment: Needs assistance Sitting-balance support: Feet unsupported, Bilateral upper extremity supported Sitting balance-Leahy Scale: Fair     Standing balance support: Bilateral upper extremity supported, Reliant on assistive device for balance, During functional activity Standing balance-Leahy Scale: Fair                              Cognition Arousal/Alertness: Awake/alert Behavior During Therapy: WFL for tasks assessed/performed Overall Cognitive Status: Within Functional Limits for tasks assessed  General Comments: pleasant and thankful for session. Does complain of nausea with exertion        Exercises      General Comments        Pertinent Vitals/Pain Pain Assessment Pain Assessment: No/denies pain    Home Living                          Prior Function            PT Goals (current goals can now be found in the care plan section) Acute Rehab PT Goals Patient Stated Goal: to be able to walk and go home to  take care of her dog PT Goal Formulation: With patient Time For Goal Achievement: 02/24/23 Potential to Achieve Goals: Fair Progress towards PT goals: Progressing toward goals    Frequency    Min 2X/week      PT Plan Current plan remains appropriate    Co-evaluation              AM-PAC PT "6 Clicks" Mobility   Outcome Measure  Help needed turning from your back to your side while in a flat bed without using bedrails?: A Little Help needed moving from lying on your back to sitting on the side of a flat bed without using bedrails?: A Little Help needed moving to and from a bed to a chair (including a wheelchair)?: A Little Help needed standing up from a chair using your arms (e.g., wheelchair or bedside chair)?: A Little Help needed to walk in hospital room?: A Lot Help needed climbing 3-5 steps with a railing? : Total 6 Click Score: 15    End of Session Equipment Utilized During Treatment: Oxygen Activity Tolerance: Patient limited by fatigue Patient left: in bed;with call bell/phone within reach;with bed alarm set Nurse Communication: Mobility status PT Visit Diagnosis: Muscle weakness (generalized) (M62.81);Other abnormalities of gait and mobility (R26.89);Difficulty in walking, not elsewhere classified (R26.2)     Time: 1610-9604 PT Time Calculation (min) (ACUTE ONLY): 23 min  Charges:  $Gait Training: 8-22 mins $Therapeutic Activity: 8-22 mins                     Elizabeth Palau, PT, DPT, GCS [phone]    Declin Rajan 02/15/2023, 4:19 PM

## 2023-02-16 DIAGNOSIS — K5792 Diverticulitis of intestine, part unspecified, without perforation or abscess without bleeding: Secondary | ICD-10-CM | POA: Diagnosis not present

## 2023-02-16 LAB — BASIC METABOLIC PANEL
Anion gap: 6 (ref 5–15)
BUN: 12 mg/dL (ref 8–23)
CO2: 30 mmol/L (ref 22–32)
Calcium: 8.6 mg/dL — ABNORMAL LOW (ref 8.9–10.3)
Chloride: 103 mmol/L (ref 98–111)
Creatinine, Ser: 0.75 mg/dL (ref 0.44–1.00)
GFR, Estimated: >60 mL/min (ref >60)
Glucose, Bld: 102 mg/dL — ABNORMAL HIGH (ref 70–99)
Potassium: 4.3 mmol/L (ref 3.5–5.1)
Sodium: 139 mmol/L (ref 135–145)

## 2023-02-16 LAB — CBC WITH DIFFERENTIAL/PLATELET
Abs Immature Granulocytes: 0.07 10*3/uL (ref 0.00–0.07)
Basophils Absolute: 0 10*3/uL (ref 0.0–0.1)
Basophils Relative: 0 %
Eosinophils Absolute: 0.1 10*3/uL (ref 0.0–0.5)
Eosinophils Relative: 2 %
HCT: 47.9 % — ABNORMAL HIGH (ref 36.0–46.0)
Hemoglobin: 15.3 g/dL — ABNORMAL HIGH (ref 12.0–15.0)
Immature Granulocytes: 1 %
Lymphocytes Relative: 14 %
Lymphs Abs: 1.2 10*3/uL (ref 0.7–4.0)
MCH: 28.3 pg (ref 26.0–34.0)
MCHC: 31.9 g/dL (ref 30.0–36.0)
MCV: 88.5 fL (ref 80.0–100.0)
Monocytes Absolute: 0.5 10*3/uL (ref 0.1–1.0)
Monocytes Relative: 5 %
Neutro Abs: 7.2 10*3/uL (ref 1.7–7.7)
Neutrophils Relative %: 78 %
Platelets: 475 10*3/uL — ABNORMAL HIGH (ref 150–400)
RBC: 5.41 MIL/uL — ABNORMAL HIGH (ref 3.87–5.11)
RDW: 14.2 % (ref 11.5–15.5)
WBC: 9.1 10*3/uL (ref 4.0–10.5)
nRBC: 0 % (ref 0.0–0.2)

## 2023-02-16 NOTE — TOC Progression Note (Addendum)
Transition of Care [facility]) - Progression Note    Patient Details  Name: Marilyn Andrews MRN: [id number] Date of Birth: November 10, 1935  Transition [facility] Care [facility]) CM/SW Contact  Chapman Fitch, RN Phone Number: 02/16/2023, 4:03 PM  Clinical Narrative:      Patient states she is too weak to go home.  Re reviewed bed offers and she accepted bed at [facility].  Accepted bed in [facility] and notified Tammy at [facility] with TOC to start insurance auth    Expected Discharge Plan and Services                                               Social Determinants of Health (SDOH) Interventions SDOH Screenings   Food Insecurity: No Food Insecurity (02/08/2023)  Housing: Low Risk  (02/08/2023)  Transportation Needs: No Transportation Needs (02/08/2023)  Utilities: Not At Risk (02/08/2023)  Tobacco Use: Low Risk  (02/08/2023)    Readmission Risk Interventions     No data to display

## 2023-02-16 NOTE — Progress Notes (Signed)
Progress Note   Patient: Marilyn Andrews ZOX:[id number] DOB: 06-16-1936 DOA: 02/08/2023     6 DOS: the patient was seen and examined on 02/16/2023    Subjective:    Patient seen and examined at bedside this morning He was able to work with physical therapist yesterday Awaiting placement in rehab/SNF Pending insurance authorization Denies nausea vomiting chest pain or cough   Brief hospital course: HPI on admission, per Dr. Maryjean Ka: "Marilyn Andrews is a 87 y.o. female with medical history significant of prior episodes of diverticulitis.  Patient was in her usual state of health till about 5 days ago when she reports a new onset of constant aching pain in the left lower quadrant.  Since then the patient's pain has become more intense, and has spread generally in the abdomen.  Patient does not report any specific aggravating or relieving factors.  It is associated with nausea and 3 episodes of nonbloody vomiting.  There is no diarrhea.  No trauma is reported no fevers reported.  Patient came to the ER today due to severity of pain and inability to tolerate p.o. diet. " Patient was admitted evening of 02/08/2023 for acute diverticulitis, being treated with IV antibiotics, IV fluids.   Assessment and Plan: * Acute diverticulitis of sigmoid colon Uncomplicated, however associated with significant nausea and vomiting and inability to tolerate p.o. diet -- Antibiotics switched from IV to Augmentin -- IV antiemetics PRN -- Continue soft diet -- Repeat CT scan of the abdomen showed some improvement with no complication or diverticulitis Patient awaiting skilled nursing facility placement Continues to await insurance authorization before placement     Left upper extremity swelling Doppler ultrasound ruled out DVT   Hypokalemia K 3.3 on admission, normalized with replacement.   --Monitor BMP, replace PRN   Erythrocytosis Mild erythrocytosis on admission.  Noted to be chronic. --f/u erythropoietin  level --outpatient hematology referral   Chronic pain Patient reports chronic low back and bilateral knee pain due to arthritis.  Also Acute abdominal pain due to diverticulitis.  On tramadol 50 my once daily at home. -- Continue tramadol 50 mg BID for now --Resumed home Robaxin, hold Zanaflex --acetaminophen for mild pain --Oxycodone for moderate to severe pain     GERD (gastroesophageal reflux disease) Continue home PPI BID   Essential hypertension-continue amlodipine   Data Reviewed: Reviewed patient's laboratory results today showing  Sodium 139 potassium 4.3 and creatinine 0.75  Family Communication: Son, Lelon Huh was updated over the phone by me   Disposition: Status is inpatient Patient is not a safe discharge and continues to await insurance authorization as well as rehab facility availability   Planned Discharge Destination: Home     Time spent: 40 minutes   Physical Exam:   General exam: awake, alert, no acute distress HEENT: moist mucus membranes, hard of hearing  Respiratory system: CTAB, no wheezes, rales or rhonchi, normal respiratory effort. Cardiovascular system: normal S1/S2, RRR, no JVD, murmurs, rubs, gallops, no pedal edema.   Gastrointestinal system: soft, +LLQ tenderness without guarding or rebound, ND, +bowel sounds. Central nervous system: A&O x3. no gross focal neurologic deficits, normal speech Extremities: moves all, no edema, normal tone Skin: dry, intact, normal temperature Psychiatry: normal mood, congruent affect, judgement and insight appear normal        Vitals:   02/15/23 0519 02/15/23 0739 02/15/23 1858 02/16/23 0521  BP: 125/63 (!) 141/68 (!) 135/57 (!) 153/87  Pulse: 85 83 76 88  Resp: 16 17 16  Temp: (!) 97.2 F (36.2 C) 98.2 F (36.8 C) 97.8 F (36.6 C) 98.1 F (36.7 C)  TempSrc: Oral  Oral   SpO2: 92% 96% 98% 96%  Weight:      Height:         Author: Loyce Dys, MD 02/16/2023 4:08 PM  For on call review  [email or web address].

## 2023-02-16 NOTE — Progress Notes (Signed)
Physical Therapy Treatment Patient Details Name: Marilyn Andrews MRN: [id number] DOB: 1936/06/08 Today's Date: 02/16/2023   History of Present Illness Marilyn Andrews is a 87 y.o. female with medical history significant of prior episodes of diverticulitis.  Patient was in her usual state of health until about 5 days ago when she reports a new onset of constant aching pain in the left lower quadrant.    PT Comments    Pt agreeable to participate with PT this session and is able to exit towards R side of bed. Able to take steps over to recliner, however needs physical assist for guidance of RW. Unsteadiness and post bias. Once seated in recliner, able to perform there-ex but requested to return back to bed, only able to stay in recliner max of 5 minutes. Still complains of nausea with activity. All mobility performed on RA with sats at 89% and HR at 118bpm. Is making slow progress towards goals.   Recommendations for follow up therapy are one component of a multi-disciplinary discharge planning process, led by the attending physician.  Recommendations may be updated based on patient status, additional functional criteria and insurance authorization.  Follow Up Recommendations  Can patient physically be transported by private vehicle: No    Assistance Recommended at Discharge Intermittent Supervision/Assistance  Patient can return home with the following Help with stairs or ramp for entrance;Assist for transportation;A little help with walking and/or transfers;A little help with bathing/dressing/bathroom;Assistance with cooking/housework   Equipment Recommendations  None recommended by PT    Recommendations for Other Services       Precautions / Restrictions Precautions Precautions: Fall Restrictions Weight Bearing Restrictions: No     Mobility  Bed Mobility Overal bed mobility: Needs Assistance Bed Mobility: Supine to Sit, Sit to Supine     Supine to sit: Supervision Sit to supine:  Min guard   General bed mobility comments: improved functional indep with all mobility. Once seated at EOB, upright posture    Transfers Overall transfer level: Needs assistance Equipment used: Rolling walker (2 wheels) Transfers: Sit to/from Stand Sit to Stand: Min assist           General transfer comment: cues for hand placement. Unsteady while standing with post sway    Ambulation/Gait Ambulation/Gait assistance: Min assist Gait Distance (Feet): 6 Feet Assistive device: Rolling walker (2 wheels) Gait Pattern/deviations: Step-to pattern       General Gait Details: took steps from bed->chair->bed. RW used and unsteadiness noted with post leaning.   Stairs             Wheelchair Mobility    Modified Rankin (Stroke Patients Only)       Balance Overall balance assessment: Needs assistance Sitting-balance support: Feet unsupported, Bilateral upper extremity supported Sitting balance-Leahy Scale: Fair     Standing balance support: Bilateral upper extremity supported, Reliant on assistive device for balance, During functional activity Standing balance-Leahy Scale: Fair                              Cognition Arousal/Alertness: Awake/alert Behavior During Therapy: WFL for tasks assessed/performed Overall Cognitive Status: Within Functional Limits for tasks assessed                                 General Comments: plesant but hesitant to perform mobiltiy        Exercises Other Exercises Other  Exercises: standing mini squats performed x 5 reps. Once seated in recliner, performed LAQ and hip add squeezes. 5-10 reps performed.    General Comments        Pertinent Vitals/Pain Pain Assessment Pain Assessment: No/denies pain    Home Living                          Prior Function            PT Goals (current goals can now be found in the care plan section) Acute Rehab PT Goals Patient Stated Goal: to be able  to walk and go home to take care of her dog PT Goal Formulation: With patient Time For Goal Achievement: 02/24/23 Potential to Achieve Goals: Fair Progress towards PT goals: Progressing toward goals    Frequency    Min 2X/week      PT Plan Current plan remains appropriate    Co-evaluation              AM-PAC PT "6 Clicks" Mobility   Outcome Measure  Help needed turning from your back to your side while in a flat bed without using bedrails?: None Help needed moving from lying on your back to sitting on the side of a flat bed without using bedrails?: None Help needed moving to and from a bed to a chair (including a wheelchair)?: A Little Help needed standing up from a chair using your arms (e.g., wheelchair or bedside chair)?: A Little Help needed to walk in hospital room?: A Lot Help needed climbing 3-5 steps with a railing? : Total 6 Click Score: 17    End of Session   Activity Tolerance: Patient limited by fatigue Patient left: in bed;with call bell/phone within reach;with bed alarm set Nurse Communication: Mobility status PT Visit Diagnosis: Muscle weakness (generalized) (M62.81);Other abnormalities of gait and mobility (R26.89);Difficulty in walking, not elsewhere classified (R26.2)     Time: 1610-9604 PT Time Calculation (min) (ACUTE ONLY): 29 min  Charges:  $Therapeutic Exercise: 8-22 mins $Therapeutic Activity: 8-22 mins                     Elizabeth Palau, PT, DPT, GCS [phone]    Marilyn Andrews 02/16/2023, 3:21 PM

## 2023-02-16 NOTE — Progress Notes (Signed)
OT Cancellation Note  Patient Details Name: Natania Finigan Meissner MRN: [id number] DOB: Oct 28, 1936   Cancelled Treatment:    Reason Eval/Treat Not Completed: Patient declined. Pt reports she feels "bad." She is unable to provide any additional details re: how she if feeling/what is troubling her. She declines participating in therapy at present, requesting therapist return tomorrow.  Latina Craver 02/16/2023, 12:29 PM

## 2023-02-17 DIAGNOSIS — K5792 Diverticulitis of intestine, part unspecified, without perforation or abscess without bleeding: Secondary | ICD-10-CM | POA: Diagnosis not present

## 2023-02-17 LAB — CBC WITH DIFFERENTIAL/PLATELET
Abs Immature Granulocytes: 0.1 10*3/uL — ABNORMAL HIGH (ref 0.00–0.07)
Basophils Absolute: 0.1 10*3/uL (ref 0.0–0.1)
Basophils Relative: 1 %
Eosinophils Absolute: 0.2 10*3/uL (ref 0.0–0.5)
Eosinophils Relative: 2 %
HCT: 50.2 % — ABNORMAL HIGH (ref 36.0–46.0)
Hemoglobin: 16.4 g/dL — ABNORMAL HIGH (ref 12.0–15.0)
Immature Granulocytes: 1 %
Lymphocytes Relative: 16 %
Lymphs Abs: 1.5 10*3/uL (ref 0.7–4.0)
MCH: 28.2 pg (ref 26.0–34.0)
MCHC: 32.7 g/dL (ref 30.0–36.0)
MCV: 86.4 fL (ref 80.0–100.0)
Monocytes Absolute: 0.5 10*3/uL (ref 0.1–1.0)
Monocytes Relative: 5 %
Neutro Abs: 6.9 10*3/uL (ref 1.7–7.7)
Neutrophils Relative %: 75 %
Platelets: 366 10*3/uL (ref 150–400)
RBC: 5.81 MIL/uL — ABNORMAL HIGH (ref 3.87–5.11)
RDW: 14.1 % (ref 11.5–15.5)
WBC: 9.3 10*3/uL (ref 4.0–10.5)
nRBC: 0 % (ref 0.0–0.2)

## 2023-02-17 LAB — BASIC METABOLIC PANEL
Anion gap: 6 (ref 5–15)
BUN: 12 mg/dL (ref 8–23)
CO2: 26 mmol/L (ref 22–32)
Calcium: 8.6 mg/dL — ABNORMAL LOW (ref 8.9–10.3)
Chloride: 104 mmol/L (ref 98–111)
Creatinine, Ser: 0.71 mg/dL (ref 0.44–1.00)
GFR, Estimated: >60 mL/min (ref >60)
Glucose, Bld: 103 mg/dL — ABNORMAL HIGH (ref 70–99)
Potassium: 4.7 mmol/L (ref 3.5–5.1)
Sodium: 136 mmol/L (ref 135–145)

## 2023-02-17 MED ORDER — ONDANSETRON 4 MG PO TBDP: 4.0000 mg | ORAL_TABLET | Freq: Three times a day (TID) | ORAL | Status: DC | PRN

## 2023-02-17 NOTE — Progress Notes (Signed)
Physical Therapy Treatment Patient Details Name: Marilyn Andrews MRN: [id number] DOB: Jul 13, 1936 Today's Date: 02/17/2023   History of Present Illness Marilyn Andrews is a 87 y.o. female with medical history significant of prior episodes of diverticulitis.  Patient was in her usual state of health until about 5 days ago when she reports a new onset of constant aching pain in the left lower quadrant.    PT Comments    Made aware of need for PT session this AM. Pt very painful/nauseated- called RN to address. Pt reluctantly agreeable to session and able to perform step pivot transfer from bed->chair. Still demonstrates post lean and needs support for transfers. Unable to take further steps at this time. Pt agreeable to attempt to sit in recliner longer this session, therefore left up in chair with plans for mobility tech to perform ADLs and return transfer. Will continue to progress as able.   Recommendations for follow up therapy are one component of a multi-disciplinary discharge planning process, led by the attending physician.  Recommendations may be updated based on patient status, additional functional criteria and insurance authorization.  Follow Up Recommendations  Can patient physically be transported by private vehicle: No    Assistance Recommended at Discharge Intermittent Supervision/Assistance  Patient can return home with the following Help with stairs or ramp for entrance;Assist for transportation;A little help with walking and/or transfers;A little help with bathing/dressing/bathroom;Assistance with cooking/housework   Equipment Recommendations  None recommended by PT    Recommendations for Other Services       Precautions / Restrictions Precautions Precautions: Fall Restrictions Weight Bearing Restrictions: No     Mobility  Bed Mobility Overal bed mobility: Needs Assistance Bed Mobility: Supine to Sit, Sit to Supine     Supine to sit: Supervision     General bed  mobility comments: slow but guarded technique with mobility attempts. LImited by pain and nausea    Transfers Overall transfer level: Needs assistance Equipment used: Rolling walker (2 wheels) Transfers: Sit to/from Stand, Bed to chair/wheelchair/BSC Sit to Stand: Min assist   Step pivot transfers: Min assist       General transfer comment: pulls up Rw despite cues. Slight post sway during standing. Able to step over to recliner. Once in recliner needs assist for positioning    Ambulation/Gait               General Gait Details: unable secondary to nausea   Stairs             Wheelchair Mobility    Modified Rankin (Stroke Patients Only)       Balance Overall balance assessment: Needs assistance Sitting-balance support: Feet unsupported, Bilateral upper extremity supported Sitting balance-Leahy Scale: Fair     Standing balance support: Bilateral upper extremity supported, Reliant on assistive device for balance, During functional activity Standing balance-Leahy Scale: Fair                              Cognition Arousal/Alertness: Awake/alert Behavior During Therapy: Agitated Overall Cognitive Status: Within Functional Limits for tasks assessed                                 General Comments: very grumpy and agitated that therapy is working with her in the morning        Exercises      General Comments  Pertinent Vitals/Pain Pain Assessment Pain Assessment: Faces Faces Pain Scale: Hurts little more Pain Location: abdomen and nausea Pain Descriptors / Indicators: Aching, Discomfort Pain Intervention(s): Limited activity within patient's tolerance, Patient requesting pain meds-RN notified, Repositioned    Home Living                          Prior Function            PT Goals (current goals can now be found in the care plan section) Acute Rehab PT Goals Patient Stated Goal: to be able to walk  and go home to take care of her dog PT Goal Formulation: With patient Time For Goal Achievement: 02/24/23 Potential to Achieve Goals: Fair Progress towards PT goals: Progressing toward goals    Frequency    Min 2X/week      PT Plan Current plan remains appropriate    Co-evaluation              AM-PAC PT "6 Clicks" Mobility   Outcome Measure  Help needed turning from your back to your side while in a flat bed without using bedrails?: None Help needed moving from lying on your back to sitting on the side of a flat bed without using bedrails?: None Help needed moving to and from a bed to a chair (including a wheelchair)?: A Little Help needed standing up from a chair using your arms (e.g., wheelchair or bedside chair)?: A Little Help needed to walk in hospital room?: A Lot Help needed climbing 3-5 steps with a railing? : Total 6 Click Score: 17    End of Session   Activity Tolerance: Patient limited by fatigue Patient left: in chair;with chair alarm set Nurse Communication: Mobility status PT Visit Diagnosis: Muscle weakness (generalized) (M62.81);Other abnormalities of gait and mobility (R26.89);Difficulty in walking, not elsewhere classified (R26.2)     Time: 1610-9604 PT Time Calculation (min) (ACUTE ONLY): 14 min  Charges:  $Therapeutic Activity: 8-22 mins                     Elizabeth Palau, PT, DPT, GCS [phone]    Paul Trettin 02/17/2023, 9:42 AM

## 2023-02-17 NOTE — Progress Notes (Addendum)
Mobility Specialist - Progress Note   02/17/23 0950  Mobility  Activity Transferred from chair to bed;Dangled on edge of bed  Level of Assistance Standby assist, set-up cues, supervision of patient - no hands on  Assistive Device Front wheel walker  Activity Response Tolerated fair  $Mobility charge 1 Mobility     Pt was mid-transfer from chair-bed upon arrival, utilizing RA. RN present in room. Pt has very low sitting tolerance, but was agreeable to participate in light ADL activity prior to returning supine. Pt returned supine with minG + UE onto LE assist; pt declined assist from author with bed mobility but very slow and effortful to complete task. Rolled L/R for repositioning. Pt left in bed with alarm set, needs in reach.    Filiberto Pinks Mobility Specialist 02/17/23, 9:57 AM

## 2023-02-17 NOTE — Progress Notes (Signed)
Progress Note   Patient: Marilyn Andrews OZH:[id number] DOB: 1936/04/19 DOA: 02/08/2023     7 DOS: the patient was seen and examined on 02/17/2023    Subjective:    Patient seen and examined at bedside this morning She was able to work with physical therapist yesterday. Intermittently declines to work with therapy, citing uncontrolled chronic back pain  Awaiting placement in rehab/SNF, pending insurance authorization Denies nausea vomiting chest pain or cough Still having intermittent LLQ / left side pain   Brief hospital course: HPI on admission, per Dr. Maryjean Ka: "Marilyn Andrews is a 87 y.o. female with medical history significant of prior episodes of diverticulitis.  Patient was in her usual state of health till about 5 days ago when she reports a new onset of constant aching pain in the left lower quadrant.  Since then the patient's pain has become more intense, and has spread generally in the abdomen.  Patient does not report any specific aggravating or relieving factors.  It is associated with nausea and 3 episodes of nonbloody vomiting.  There is no diarrhea.  No trauma is reported no fevers reported.  Patient came to the ER today due to severity of pain and inability to tolerate p.o. diet. " Patient was admitted evening of 02/08/2023 for acute diverticulitis. She was treated with IV antibiotics, IV fluids.   4/17 -- medically stable awaiting SNF placement / insurance auth.    Assessment and Plan: * Acute diverticulitis of sigmoid colon Uncomplicated, however associated with significant nausea and vomiting and inability to tolerate p.o. diet -- Antibiotics switched from IV to Augmentin -- IV antiemetics PRN -- Continue soft diet -- Repeat CT scan of the abdomen showed some improvement with no complication or diverticulitis   Generalized Weakness PT/OT recommending SNF.  Pt and family are agreeable, she needs rehab prior to safe return home. Continues to await insurance authorization for  SNF/rehab placement Fall precautions Continue PT/OT while here Up in chair for meals and every shift     Left upper extremity swelling Doppler ultrasound ruled out DVT   Hypokalemia - resolved K 3.3 on admission, normalized with replacement.   --Monitor BMP, replace PRN   Erythrocytosis Mild erythrocytosis on admission.  Noted to be chronic. --f/u erythropoietin level --outpatient hematology referral   Chronic pain Patient reports chronic low back and bilateral knee pain due to arthritis.  Also Acute abdominal pain due to diverticulitis.  On tramadol 50 my once daily at home. -- Continue tramadol 50 mg BID for now --Resumed home Robaxin, hold Zanaflex --acetaminophen for mild pain --Oxycodone for moderate to severe pain     GERD (gastroesophageal reflux disease) Continue home PPI BID   Essential hypertension-continue amlodipine      Data Reviewed:  Notable labs -- glucose 103, Ca 8.6, otherwise normal BMP.  Hbg 16.4    Family Communication: none present, will attempt to call son as time allows.   No new medical updates at this time.    Disposition: Status is inpatient Patient is not a safe discharge and continues to await insurance authorization as well as rehab facility availability    Planned Discharge Destination: SNF/rehab     Time spent: 44 minutes   Physical Exam:   General exam: awake, alert, no acute distress HEENT: moist mucus membranes, hard of hearing  Respiratory system: CTAB, no wheezes, rales or rhonchi, normal respiratory effort. Cardiovascular system: normal S1/S2, RRR, trace non pitting BLE edema.   Gastrointestinal system: soft, non-tender, ND, +bowel  sounds. Central nervous system: A&O x3. no gross focal neurologic deficits, normal speech Extremities: moves all, no edema, normal tone Skin: dry, intact, normal temperature Psychiatry: normal mood, congruent affect        Vitals:   02/16/23 1845 02/17/23 0415 02/17/23 0800 02/17/23  1445  BP: (!) 145/67 (!) 148/70 (!) 147/49 (!) 141/68  Pulse: 84 98 94 87  Resp: Temp: 98.5 F (36.9 C) 97.7 F (36.5 C) 98.2 F (36.8 C) 98.2 F (36.8 C)  TempSrc:  Oral Oral Oral  SpO2: 94% 97% 94% 95%  Weight:      Height:         Author: Pennie Banter, DO 02/17/2023 3:51 PM  For on call review [email or web address].

## 2023-02-17 NOTE — TOC Progression Note (Signed)
Transition of Care [facility]) - Progression Note    Patient Details  Name: Marilyn Andrews MRN: [id number] Date of Birth: [birth year]  Transition of Care [facility]) CM/SW Contact  Chapman Fitch, RN Phone Number: 02/17/2023, 11:12 AM  Clinical Narrative:      Insurance Paediatric nurse with [facility] has submitted updated clinical       Expected Discharge Plan and Services                                               Social Determinants of Health (SDOH) Interventions SDOH Screenings   Food Insecurity: No Food Insecurity (02/08/2023)  Housing: Low Risk  (02/08/2023)  Transportation Needs: No Transportation Needs (02/08/2023)  Utilities: Not At Risk (02/08/2023)  Tobacco Use: Low Risk  (02/08/2023)    Readmission Risk Interventions     No data to display

## 2023-02-17 NOTE — Progress Notes (Signed)
OT Cancellation Note  Patient Details Name: Kajuana Shareef Raphael MRN: [id number] DOB: [birth year]   Cancelled Treatment:    Reason Eval/Treat Not Completed: Patient declined, no reason specified. OT attempted pt at 0843 and 1357; both times pt declining OT. In morning pt stating that she is having pain and doesn't want to get up. In afternoon pt opening eyes, making swatting motion with her hand, and closing eyes again and not responding to OT. OT notified PT, MD, and case manager.  Alvester Morin 02/17/2023, 2:06 PM

## 2023-02-18 ENCOUNTER — Encounter: Payer: Self-pay | Admitting: Internal Medicine

## 2023-02-18 DIAGNOSIS — K5792 Diverticulitis of intestine, part unspecified, without perforation or abscess without bleeding: Secondary | ICD-10-CM | POA: Diagnosis not present

## 2023-02-18 DIAGNOSIS — M7989 Other specified soft tissue disorders: Secondary | ICD-10-CM | POA: Insufficient documentation

## 2023-02-18 MED ORDER — IBUPROFEN 200 MG PO TABS: 400.0000 mg | ORAL_TABLET | Freq: Four times a day (QID) | ORAL | 0 refills | Status: DC | PRN

## 2023-02-18 MED ORDER — OXYCODONE HCL 5 MG PO TABS: 5.0000 mg | ORAL_TABLET | Freq: Four times a day (QID) | ORAL | 0 refills | Status: DC | PRN

## 2023-02-18 MED ORDER — AMLODIPINE BESYLATE 10 MG PO TABS: 10.0000 mg | ORAL_TABLET | Freq: Every day | ORAL | Status: AC

## 2023-02-18 MED ORDER — LORAZEPAM 0.5 MG PO TABS: 0.5000 mg | ORAL_TABLET | Freq: Two times a day (BID) | ORAL | 0 refills | Status: DC

## 2023-02-18 MED ORDER — TRAMADOL HCL 50 MG PO TABS: 50.0000 mg | ORAL_TABLET | Freq: Two times a day (BID) | ORAL | 0 refills | Status: DC

## 2023-02-18 MED ORDER — ONDANSETRON 8 MG PO TBDP: 8.0000 mg | ORAL_TABLET | Freq: Three times a day (TID) | ORAL | Status: AC | PRN

## 2023-02-18 NOTE — TOC Transition Note (Signed)
Transition of Care [facility]) - CM/SW Discharge Note   Patient Details  Name: Marilyn Andrews MRN: [id number] Date of Birth: June 15, 1936  Transition of Care [facility]) CM/SW Contact:  Chapman Fitch, RN Phone Number: 02/18/2023, 10:59 AM   Clinical Narrative:     Patient will DC [id number] Anticipated DC date: 02/18/23  Family notified: son Oceanographer by: Wendie Simmer  Per MD patient ready for DC to . RN, patient, patient's family, and facility notified of DC. Discharge Summary sent to facility. RN given number for report. DC packet on chart. Ambulance transport requested for patient.  TOC signing off.  Bevelyn Ngo [facility] [phone]         Patient Goals and CMS Choice      Discharge Placement                         Discharge Plan and Services Additional resources added to the After Visit Summary for                                       Social Determinants of Health (SDOH) Interventions SDOH Screenings   Food Insecurity: No Food Insecurity (02/08/2023)  Housing: Low Risk  (02/08/2023)  Transportation Needs: No Transportation Needs (02/08/2023)  Utilities: Not At Risk (02/08/2023)  Tobacco Use: Low Risk  (02/18/2023)     Readmission Risk Interventions     No data to display

## 2023-02-18 NOTE — Assessment & Plan Note (Signed)
Amlodipine was increased 5 >> 10 mg daily. Monitor BP after d/c and follow up with PCP.

## 2023-02-18 NOTE — Progress Notes (Signed)
Report called to [place] RN at [employer], phone number left with RN for questions, all concerns and questions answered at this time.

## 2023-02-18 NOTE — Assessment & Plan Note (Signed)
Doppler ultrasound ruled out DVT. Monitor, elevate extremity

## 2023-02-18 NOTE — Discharge Summary (Signed)
Physician Discharge Summary   Patient: Marilyn Andrews MRN: [id number] DOB: [birth year]  Admit date:     02/08/2023  Discharge date: 02/18/23  Discharge Physician: Pennie Banter   PCP: [facility]   Recommendations at discharge:   Follow up with Primary Care in 1-2 weeks Follow up with Pain Management Repeat CBC, BMP in 1-2 weeks Follow up on blood pressure control - amlodipine was increased Reassess need for ongoing pain and anxiety medications and reduce regimen as appropriate.  Pt currently with exacerbated chronic back pain.  Discharge Diagnoses: Active Problems:   Diverticulitis   HTN (hypertension)   GERD (gastroesophageal reflux disease)   Generalized weakness   Chronic pain   Erythrocytosis   Left upper extremity swelling  Principal Problem (Resolved):   Acute diverticulitis Resolved Problems:   Hypokalemia  Hospital Course: HPI on admission, per Dr. Maryjean Ka: "Marilyn Andrews is a 87 y.o. female with medical history significant of prior episodes of diverticulitis.  Patient was in her usual state of health till about 5 days ago when she reports a new onset of constant aching pain in the left lower quadrant.  Since then the patient's pain has become more intense, and has spread generally in the abdomen.  Patient does not report any specific aggravating or relieving factors.  It is associated with nausea and 3 episodes of nonbloody vomiting.  There is no diarrhea.  No trauma is reported no fevers reported.  Patient came to the ER today due to severity of pain and inability to tolerate p.o. diet. "   Patient was admitted evening of 02/08/2023 for acute diverticulitis, being treated with IV antibiotics, IV fluids. On clear liquid diet.  Assessment and Plan: * Acute diverticulitis-resolved as of 02/18/2023 Uncomplicated, however associated with significant nausea and vomiting and inability to tolerate p.o. diet at time of admission. Clinically improved, tolerating  diet. Still having intermittent nausea. Repeat CT scan of the abdomen showed some improvement with no complication or diverticulitis.  Completed 10 days antibiotics, initially IV Rocephin, Flagyl, followed by PO Augmentin. --Zofran ODT's PRN nausea for d/c --Monitor for recurrent symptoms   Left upper extremity swelling Doppler ultrasound ruled out DVT. Monitor, elevate extremity  Erythrocytosis Mild erythrocytosis on admission.  Noted to be chronic. --f/u erythropoietin level --outpatient hematology referral  Chronic pain Patient reports chronic low back and bilateral knee pain due to arthritis.  Also Acute abdominal pain due to diverticulitis.  On tramadol 50 my once daily at home. -- Continue tramadol 50 mg BID for now -- Resumed muscle relaxers --Mobic was held during admission, resume at d/c --Acetaminophen for mild pain --Oxycodone for breakthrough pain despite above --Follow up with outpatient pain provider  Generalized weakness PT/OT recommending SNF.   Pt and family are agreeable, she needs rehab prior to safe return home. Approved for SNF/rehab  Fall precautions Up in chair for meals and every shift  GERD (gastroesophageal reflux disease) Continue home PPI BID  HTN (hypertension) Amlodipine was increased 5 >> 10 mg daily. Monitor BP after d/c and follow up with PCP.  Hypokalemia-resolved as of 02/18/2023 K 3.3 on admission, replaced. K has remained stable  --Monitor BMP at follow up         Consultants: None Procedures performed: None  Disposition: Skilled nursing facility Diet recommendation:  Discharge Diet Orders (From admission, onward)     Start     Ordered   02/18/23 0000  Diet - low sodium heart healthy  02/18/23 0935            DISCHARGE MEDICATION: Allergies as of 02/18/2023       Reactions   Penicillins Other (See Comments)   Reaction as a child, patient does not recall Has patient had a PCN reaction causing immediate  rash, facial/tongue/throat swelling, SOB or lightheadedness with hypotension: unknown Has patient had a PCN reaction causing severe rash involving mucus membranes or skin necrosis: unknown Has patient had a PCN reaction that required hospitalization: unknown Has patient had a PCN reaction occurring within the last 10 years: No If all of the above answers are "NO", then may proceed with Cephalosporin use.   Sulfa Antibiotics Nausea Only        Medication List     STOP taking these medications    scopolamine 1 MG/3DAYS Commonly known as: TRANSDERM-SCOP       TAKE these medications    acetaminophen 500 MG tablet Commonly known as: TYLENOL Take 1,000 mg by mouth every 6 (six) hours as needed for mild pain or moderate pain.   amLODipine 10 MG tablet Commonly known as: NORVASC Take 1 tablet (10 mg total) by mouth daily. Start taking on: February 19, 2023 What changed:  medication strength how much to take   diphenhydrAMINE 50 MG tablet Commonly known as: BENADRYL Take 1 tablet (50 mg total) by mouth at bedtime as needed for itching.   escitalopram 20 MG tablet Commonly known as: LEXAPRO Take 1 tablet (20 mg total) by mouth daily.   feeding supplement Liqd Take 237 mLs by mouth 2 (two) times daily between meals.   fluticasone 50 MCG/ACT nasal spray Commonly known as: FLONASE Place 2 sprays into both nostrils 2 (two) times daily as needed for allergies or rhinitis.   ibuprofen 200 MG tablet Commonly known as: ADVIL Take 2 tablets (400 mg total) by mouth every 6 (six) hours as needed for headache, mild pain or fever. What changed:  how much to take reasons to take this   lactulose 20 g packet Commonly known as: CEPHULAC Take 1 packet (20 g total) by mouth daily as needed.   LORazepam 0.5 MG tablet Commonly known as: ATIVAN Take 1 tablet (0.5 mg total) by mouth 2 (two) times daily.   meloxicam 15 MG tablet Commonly known as: MOBIC Take 15 mg by mouth daily.    methocarbamol 500 MG tablet Commonly known as: Robaxin Take 1 tablet (500 mg total) by mouth every 6 (six) hours as needed for muscle spasms.   multivitamin with minerals Tabs tablet Take 1 tablet by mouth daily.   ondansetron 8 MG disintegrating tablet Commonly known as: ZOFRAN-ODT Take 1 tablet (8 mg total) by mouth every 8 (eight) hours as needed for nausea or vomiting. What changed:  medication strength how much to take   oxyCODONE 5 MG immediate release tablet Commonly known as: Oxy IR/ROXICODONE Take 1 tablet (5 mg total) by mouth every 6 (six) hours as needed for breakthrough pain (pain despite tramadol).   pantoprazole 40 MG tablet Commonly known as: PROTONIX Take 1 tablet (40 mg total) by mouth 2 (two) times daily.   rosuvastatin 20 MG tablet Commonly known as: CRESTOR Take 20 mg by mouth at bedtime.   senna-docusate 8.6-50 MG tablet Commonly known as: Senokot-S Take 2 tablets by mouth 2 (two) times daily as needed for mild constipation.   tiZANidine 2 MG tablet Commonly known as: ZANAFLEX Take 2 mg by mouth 3 (three) times daily.   traMADol  50 MG tablet Commonly known as: ULTRAM Take 1 tablet (50 mg total) by mouth 2 (two) times daily. What changed:  when to take this reasons to take this   triamcinolone 0.025 % cream Commonly known as: KENALOG Apply 1 Application topically 2 (two) times daily.        Discharge Exam: Filed Weights   02/08/23 2317  Weight: 76.6 kg   General exam: awake, alert, no acute distress, obese HEENT: moist mucus membranes, hard of hearing  Respiratory system: CTAB diminished bases due to poor inspiratory effort, no wheezes, normal respiratory effort, on room air. Cardiovascular system: normal S1/S2, RRR Gastrointestinal system: soft, NT, ND, no HSM felt, +bowel sounds. Central nervous system: A&O x3. no gross focal neurologic deficits, normal speech Extremities: stable improving LUE swelling, no edema, normal tone Skin:  dry, intact, normal temperature Psychiatry: normal mood, flat affect  Condition at discharge: stable  The results of significant diagnostics from this hospitalization (including imaging, microbiology, ancillary and laboratory) are listed below for reference.   Imaging Studies: CT ABDOMEN PELVIS W CONTRAST  Result Date: 02/14/2023 CLINICAL DATA:  Diverticulitis, complication suspected EXAM: CT ABDOMEN AND PELVIS WITH CONTRAST TECHNIQUE: Multidetector CT imaging of the abdomen and pelvis was performed using the standard protocol following bolus administration of intravenous contrast. RADIATION DOSE REDUCTION: This exam was performed according to the departmental dose-optimization program which includes automated exposure control, adjustment of the mA and/or kV according to patient size and/or use of iterative reconstruction technique. CONTRAST:  OMNIPAQUE IOHEXOL 300 MG/ML  SOLN COMPARISON:  02/08/2023 FINDINGS: Lower chest: Bibasilar subsegmental atelectasis. Hepatobiliary: No focal liver abnormality. Prior cholecystectomy. Unchanged intra and extrahepatic biliary dilatation. Pancreas: Unremarkable. No pancreatic ductal dilatation or surrounding inflammatory changes. Spleen: Normal in size without focal abnormality. Adrenals/Urinary Tract: Unremarkable adrenal glands. Kidneys enhance symmetrically without focal lesion, stone, or hydronephrosis. Ureters are nondilated. Urinary bladder appears unremarkable for the degree of distention. Stomach/Bowel: Stomach is within normal limits. No dilated loops of bowel. Persistent but improving changes of mild uncomplicated diverticulitis of the mid sigmoid colon. No pericolonic fluid collection or extraluminal air. Extensive diverticulosis throughout the left colon. No additional sites of bowel wall thickening or inflammatory changes. Vascular/Lymphatic: Aortic atherosclerosis. No enlarged abdominal or pelvic lymph nodes. Reproductive: Status post hysterectomy.  No adnexal masses. Other: No free fluid. No abdominopelvic fluid collection. No pneumoperitoneum. No abdominal wall hernia. Musculoskeletal: No new or acute bony abnormality. IMPRESSION: 1. Persistent but improving changes of mild uncomplicated diverticulitis of the mid sigmoid colon. No pericolonic fluid collection or extraluminal air. 2. Remainder of the examination is unchanged. Electronically Signed   By: Duanne Guess D.O.   On: 02/14/2023 15:22   US Venous Img Upper Uni Left (DVT)  Result Date: 02/13/2023 CLINICAL DATA:  Left upper extremity swelling EXAM: LEFT UPPER EXTREMITY VENOUS DOPPLER ULTRASOUND TECHNIQUE: Gray-scale sonography with graded compression, as well as color Doppler and duplex ultrasound were performed to evaluate the upper extremity deep venous system from the level of the subclavian vein and including the jugular, axillary, basilic, radial, ulnar and upper cephalic vein. Spectral Doppler was utilized to evaluate flow at rest and with distal augmentation maneuvers. COMPARISON:  None Available. FINDINGS: Contralateral Subclavian Vein: Respiratory phasicity is normal and symmetric with the symptomatic side. No evidence of thrombus. Normal compressibility. Internal Jugular Vein: No evidence of thrombus. Normal compressibility, respiratory phasicity and response to augmentation. Subclavian Vein: No evidence of thrombus. Normal compressibility, respiratory phasicity and response to augmentation. Axillary Vein: No evidence  of thrombus. Normal compressibility, respiratory phasicity and response to augmentation. Cephalic Vein: No evidence of thrombus. Normal compressibility, respiratory phasicity and response to augmentation. Basilic Vein: No evidence of thrombus. Normal compressibility, respiratory phasicity and response to augmentation. Brachial Veins: No evidence of thrombus. Normal compressibility, respiratory phasicity and response to augmentation. Radial Veins: No evidence of thrombus.  Normal compressibility, respiratory phasicity and response to augmentation. Ulnar Veins: No evidence of thrombus. Normal compressibility, respiratory phasicity and response to augmentation. Venous Reflux:  None visualized. Other Findings:  None visualized. IMPRESSION: No evidence of DVT within the left upper extremity. Electronically Signed   By: Malachy Moan M.D.   On: 02/13/2023 13:39   CT ABDOMEN PELVIS W CONTRAST  Result Date: 02/08/2023 CLINICAL DATA:  Abdominal pain for 1 week, initial encounter EXAM: CT ABDOMEN AND PELVIS WITH CONTRAST TECHNIQUE: Multidetector CT imaging of the abdomen and pelvis was performed using the standard protocol following bolus administration of intravenous contrast. RADIATION DOSE REDUCTION: This exam was performed according to the departmental dose-optimization program which includes automated exposure control, adjustment of the mA and/or kV according to patient size and/or use of iterative reconstruction technique. CONTRAST:  OMNIPAQUE IOHEXOL 300 MG/ML  SOLN COMPARISON:  08/21/2022 FINDINGS: Lower chest: Mild linear scarring is noted in the bases bilaterally. This is stable from the prior exam. Hepatobiliary: Gallbladder has been surgically removed. Prominence of the intrahepatic and extrahepatic biliary tree is noted consistent with the post cholecystectomy state. The liver is within normal limits. Pancreas: Unremarkable. No pancreatic ductal dilatation or surrounding inflammatory changes. Spleen: Normal in size without focal abnormality. Adrenals/Urinary Tract: Adrenal glands are within normal limits. Kidneys demonstrate a normal enhancement pattern bilaterally. No renal calculi or urinary tract obstructive changes are seen. Normal excretion is noted on delayed images. The bladder is decompressed. Stomach/Bowel: Diverticular change of the colon is noted with new diverticulitis in the distal more proximal sigmoid is within normal limits. Proximal colon shows no  obstructive changes. The appendix has been surgically removed. Small bowel and stomach are unremarkable. Vascular/Lymphatic: Aortic atherosclerosis. No enlarged abdominal or pelvic lymph nodes. Reproductive: Status post hysterectomy. No adnexal masses. Other: No abdominal wall hernia or abnormality. No abdominopelvic ascites. Musculoskeletal: No acute or significant osseous findings. IMPRESSION: Changes consistent with sigmoid diverticulitis. No evidence of perforation or focal abscess is seen. Status post cholecystectomy. Electronically Signed   By: Alcide Clever M.D.   On: 02/08/2023 20:37    Microbiology: Results for orders placed or performed during the hospital encounter of 08/21/22  Urine Culture     Status: Abnormal   Collection Time: 08/21/22  6:43 PM   Specimen: Urine, Clean Catch  Result Value Ref Range Status   Specimen Description   Final    URINE, CLEAN CATCH Performed at [facility], [address], [place], [place] [address]    Special Requests   Final    NONE Performed at [facility], [address] [place], [place] [address]    Culture MULTIPLE SPECIES PRESENT, SUGGEST RECOLLECTION (A)  Final   Report Status 08/23/2022 FINAL  Final  Resp Panel by RT-PCR (Flu A&B, Covid) Anterior Nasal Swab     Status: None   Collection Time: 08/21/22  9:31 PM   Specimen: Anterior Nasal Swab  Result Value Ref Range Status   SARS Coronavirus 2 by RT PCR NEGATIVE NEGATIVE Final    Comment: (NOTE) SARS-CoV-2 target nucleic acids are NOT DETECTED.  The SARS-CoV-2 RNA is generally detectable in upper respiratory  specimens during the acute phase of infection. The lowest concentration of SARS-CoV-2 viral copies this assay can detect is 138 copies/mL. A negative result does not preclude SARS-Cov-2 infection and should not be used as the sole basis for treatment or other patient management decisions. A negative result may occur with  improper specimen collection/handling,  submission of specimen other than nasopharyngeal swab, presence of viral mutation(s) within the areas targeted by this assay, and inadequate number of viral copies(<138 copies/mL). A negative result must be combined with clinical observations, patient history, and epidemiological information. The expected result is Negative.  Fact Sheet for Patients:  BloggerCourse.com  Fact Sheet for Healthcare Providers:  SeriousBroker.it  This test is no t yet approved or cleared by the [place] FDA and  has been authorized for detection and/or diagnosis of SARS-CoV-2 by FDA under an Emergency Use Authorization (EUA). This EUA will remain  in effect (meaning this test can be used) for the duration of the COVID-19 declaration under Section 564(b)(1) of the Act, 21 U.S.C.section 360bbb-3(b)(1), unless the authorization is terminated  or revoked sooner.       Influenza A by PCR NEGATIVE NEGATIVE Final   Influenza B by PCR NEGATIVE NEGATIVE Final    Comment: (NOTE) The Xpert Xpress SARS-CoV-2/FLU/RSV plus assay is intended as an aid in the diagnosis of influenza from Nasopharyngeal swab specimens and should not be used as a sole basis for treatment. Nasal washings and aspirates are unacceptable for Xpert Xpress SARS-CoV-2/FLU/RSV testing.  Fact Sheet for Patients: BloggerCourse.com  Fact Sheet for Healthcare Providers: SeriousBroker.it  This test is not yet approved or cleared by the [place] FDA and has been authorized for detection and/or diagnosis of SARS-CoV-2 by FDA under an Emergency Use Authorization (EUA). This EUA will remain in effect (meaning this test can be used) for the duration of the COVID-19 declaration under Section 564(b)(1) of the Act, 21 U.S.C. section 360bbb-3(b)(1), unless the authorization is terminated or revoked.  Performed at [facility], [address] [place], [place] [address]     Labs: CBC: Recent Labs  Lab 02/12/23 0354 02/13/23 0434 02/16/23 0929 02/17/23 0751  WBC 8.8 8.9 9.1 9.3  NEUTROABS 7.0 7.2 7.2 6.9  HGB 12.9 13.1 15.3* 16.4*  HCT 40.1 41.0 47.9* 50.2*  MCV 88.9 88.9 88.5 86.4  PLT 373 394 475* 366   Basic Metabolic Panel: Recent Labs  Lab 02/12/23 0354 02/13/23 0434 02/15/23 0529 02/16/23 0929 02/17/23 0751  NA 139 138  --  139 136  K 4.2 4.2  --  4.3 4.7  CL 106 108  --  103 104  CO2 27 25  --  30 26  GLUCOSE 131* 113*  --  102* 103*  BUN 8 9  --  12 12  CREATININE 0.95 0.84 0.77 0.75 0.71  CALCIUM 8.1* 8.2*  --  8.6* 8.6*   Liver Function Tests: No results for input(s): "AST", "ALT", "ALKPHOS", "BILITOT", "PROT", "ALBUMIN" in the last 168 hours. CBG: No results for input(s): "GLUCAP" in the last 168 hours.  Discharge time spent: greater than 30 minutes.  Signed: Pennie Banter, DO [facility] 02/18/2023

## 2023-02-18 NOTE — Assessment & Plan Note (Signed)
PT/OT recommending SNF.   Pt and family are agreeable, she needs rehab prior to safe return home. Approved for SNF/rehab  Fall precautions Up in chair for meals and every shift

## 2023-02-18 NOTE — Progress Notes (Signed)
Report called to facility, patient notified of discharge, notified of room at [facility] and educated on discharge information, all querstions and concerns addressed at this time. Iv removed with no complications. Ems transport at bedside to transport patient, report given and patient escorted off unit by ems members

## 2023-02-24 ENCOUNTER — Inpatient Hospital Stay: Payer: Medicare HMO

## 2023-02-24 ENCOUNTER — Encounter: Payer: Self-pay | Admitting: Oncology

## 2023-02-24 ENCOUNTER — Inpatient Hospital Stay: Payer: Medicare HMO | Attending: Oncology | Admitting: Oncology

## 2023-02-24 VITALS — BP 115/69 | HR 80 | Temp 97.7°F | Resp 22 | Ht 63.0 in

## 2023-02-24 DIAGNOSIS — R630 Anorexia: Secondary | ICD-10-CM

## 2023-02-24 DIAGNOSIS — R109 Unspecified abdominal pain: Secondary | ICD-10-CM | POA: Diagnosis not present

## 2023-02-24 DIAGNOSIS — Z880 Allergy status to penicillin: Secondary | ICD-10-CM | POA: Diagnosis not present

## 2023-02-24 DIAGNOSIS — Z9079 Acquired absence of other genital organ(s): Secondary | ICD-10-CM | POA: Diagnosis not present

## 2023-02-24 DIAGNOSIS — Z9049 Acquired absence of other specified parts of digestive tract: Secondary | ICD-10-CM | POA: Diagnosis not present

## 2023-02-24 DIAGNOSIS — D72829 Elevated white blood cell count, unspecified: Secondary | ICD-10-CM | POA: Diagnosis not present

## 2023-02-24 DIAGNOSIS — R5383 Other fatigue: Secondary | ICD-10-CM | POA: Diagnosis not present

## 2023-02-24 DIAGNOSIS — Z882 Allergy status to sulfonamides status: Secondary | ICD-10-CM | POA: Diagnosis not present

## 2023-02-24 DIAGNOSIS — R110 Nausea: Secondary | ICD-10-CM

## 2023-02-24 DIAGNOSIS — Z90710 Acquired absence of both cervix and uterus: Secondary | ICD-10-CM | POA: Diagnosis not present

## 2023-02-24 DIAGNOSIS — D751 Secondary polycythemia: Secondary | ICD-10-CM

## 2023-02-24 DIAGNOSIS — R531 Weakness: Secondary | ICD-10-CM | POA: Diagnosis not present

## 2023-02-24 DIAGNOSIS — I700 Atherosclerosis of aorta: Secondary | ICD-10-CM

## 2023-02-24 DIAGNOSIS — Z8249 Family history of ischemic heart disease and other diseases of the circulatory system: Secondary | ICD-10-CM | POA: Diagnosis not present

## 2023-02-24 DIAGNOSIS — Z79899 Other long term (current) drug therapy: Secondary | ICD-10-CM

## 2023-02-24 LAB — CMP (CANCER CENTER ONLY)
ALT: 21 U/L (ref 0–44)
AST: 25 U/L (ref 15–41)
Albumin: 3.3 g/dL — ABNORMAL LOW (ref 3.5–5.0)
Alkaline Phosphatase: 71 U/L (ref 38–126)
Anion gap: 9 (ref 5–15)
BUN: 16 mg/dL (ref 8–23)
CO2: 26 mmol/L (ref 22–32)
Calcium: 8.6 mg/dL — ABNORMAL LOW (ref 8.9–10.3)
Chloride: 100 mmol/L (ref 98–111)
Creatinine: 1.04 mg/dL — ABNORMAL HIGH (ref 0.44–1.00)
GFR, Estimated: 53 mL/min — ABNORMAL LOW (ref >60)
Glucose, Bld: 120 mg/dL — ABNORMAL HIGH (ref 70–99)
Potassium: 3.9 mmol/L (ref 3.5–5.1)
Sodium: 135 mmol/L (ref 135–145)
Total Bilirubin: 0.5 mg/dL (ref 0.3–1.2)
Total Protein: 6.9 g/dL (ref 6.5–8.1)

## 2023-02-24 LAB — CBC (CANCER CENTER ONLY)
HCT: 44.9 % (ref 36.0–46.0)
Hemoglobin: 14.6 g/dL (ref 12.0–15.0)
MCH: 28.4 pg (ref 26.0–34.0)
MCHC: 32.5 g/dL (ref 30.0–36.0)
MCV: 87.4 fL (ref 80.0–100.0)
Platelet Count: 321 10*3/uL (ref 150–400)
RBC: 5.14 MIL/uL — ABNORMAL HIGH (ref 3.87–5.11)
RDW: 14 % (ref 11.5–15.5)
WBC Count: 11.3 10*3/uL — ABNORMAL HIGH (ref 4.0–10.5)
nRBC: 0 % (ref 0.0–0.2)

## 2023-02-24 LAB — IRON AND TIBC
Iron: 72 ug/dL (ref 28–170)
Saturation Ratios: 23 % (ref 10.4–31.8)
TIBC: 316 ug/dL (ref 250–450)
UIBC: 244 ug/dL

## 2023-02-24 LAB — FERRITIN: Ferritin: 117 ng/mL (ref 11–307)

## 2023-02-24 NOTE — Progress Notes (Signed)
Presents as a new patient with erythrocytosis. She is not feeling well. States she is dizzy, nauseated, constipated, and weak.

## 2023-02-24 NOTE — Progress Notes (Signed)
[facility]  [phone] [phone]  ID: Marilyn Andrews OB: [birth year]  MR#: [id number]  QIH#:[id number]  Patient Care Team: Center, [facility] as PCP - General (General Practice) Orlie Dakin, Tollie Pizza, MD as Consulting Physician (Oncology)  CHIEF COMPLAINT: Erythrocytosis  INTERVAL HISTORY: Patient is an 87 year old female with multiple medical problems and complaints who was referred to clinic for mildly elevated hemoglobin.  She has chronic weakness and fatigue.  She has no neurologic complaints.  She denies any recent fevers.  She has a poor appetite and complains of persistent nausea.  She has no chest pain, shortness of breath, cough, or hemoptysis.  She denies any vomiting, constipation, or diarrhea.  She has no urinary complaints.  Patient generally feels terrible, but offers no further specific complaints today.  REVIEW OF SYSTEMS:   Review of Systems  Constitutional:  Positive for malaise/fatigue. Negative for fever and weight loss.  Respiratory: Negative.  Negative for cough, hemoptysis and shortness of breath.   Cardiovascular: Negative.  Negative for chest pain and leg swelling.  Gastrointestinal:  Positive for nausea. Negative for abdominal pain, constipation and diarrhea.  Genitourinary: Negative.  Negative for dysuria.  Musculoskeletal: Negative.  Negative for back pain.  Skin: Negative.  Negative for rash.  Neurological:  Positive for weakness. Negative for dizziness, focal weakness and headaches.  Psychiatric/Behavioral: Negative.  The patient is not nervous/anxious.     As per HPI. Otherwise, a complete review of systems is negative.  PAST MEDICAL HISTORY: Past Medical History:  Diagnosis Date   Acute diverticulitis 03/07/2018   Anxiety    Arthritis    Depression    Diverticulitis    Dyspnea    Edema    FEET/LEGS   HOH (hard of hearing)    Hypertension    Hypokalemia 02/09/2023    PAST SURGICAL  HISTORY: Past Surgical History:  Procedure Laterality Date   ABDOMINAL HYSTERECTOMY     APPENDECTOMY     BACK SURGERY     CATARACT EXTRACTION W/PHACO Right 08/30/2018   Procedure: CATARACT EXTRACTION PHACO AND INTRAOCULAR LENS PLACEMENT (IOC);  Surgeon: Galen Manila, MD;  Location: [facility] ORS;  Service: Ophthalmology;  Laterality: Right;  [place] 01:07.1 CDE 13.66 Fluid Pack Lot # [id number]   CATARACT EXTRACTION W/PHACO Left 09/20/2018   Procedure: CATARACT EXTRACTION PHACO AND INTRAOCULAR LENS PLACEMENT (IOC);  Surgeon: Galen Manila, MD;  Location: [facility] ORS;  Service: Ophthalmology;  Laterality: Left;  [place] 01:09 CDE 11.45 fluid pack lot # [id number]   CHOLECYSTECTOMY     HYSTERECTOMY ABDOMINAL WITH SALPINGECTOMY      FAMILY HISTORY: Family History  Problem Relation Age of Onset   Hypertension Father    CAD Brother     ADVANCED DIRECTIVES (Y/N):  N  HEALTH MAINTENANCE: Social History   Tobacco Use   Smoking status: Never   Smokeless tobacco: Never  Vaping Use   Vaping Use: Never used  Substance Use Topics   Alcohol use: No   Drug use: No     Colonoscopy:  PAP:  Bone density:  Lipid panel:  Allergies  Allergen Reactions   Penicillins Other (See Comments)    Reaction as a child, patient does not recall  Has patient had a PCN reaction causing immediate rash, facial/tongue/throat swelling, SOB or lightheadedness with hypotension: unknown Has patient had a PCN reaction causing severe rash involving mucus membranes or skin necrosis: unknown Has patient had a PCN reaction that required hospitalization: unknown Has patient had a PCN reaction  occurring within the last 10 years: No If all of the above answers are "NO", then may proceed with Cephalosporin use.   Sulfa Antibiotics Nausea Only    Current Outpatient Medications  Medication Sig Dispense Refill   acetaminophen (TYLENOL) 500 MG tablet Take 1,000 mg by mouth every 6 (six) hours as needed for mild pain or  moderate pain.      amLODipine (NORVASC) 10 MG tablet Take 1 tablet (10 mg total) by mouth daily.     diphenhydrAMINE (BENADRYL) 50 MG tablet Take 1 tablet (50 mg total) by mouth at bedtime as needed for itching. 30 tablet 0   escitalopram (LEXAPRO) 20 MG tablet Take 1 tablet (20 mg total) by mouth daily. 30 tablet 0   feeding supplement (ENSURE ENLIVE / ENSURE PLUS) LIQD Take 237 mLs by mouth 2 (two) times daily between meals. 237 mL 12   fluticasone (FLONASE) 50 MCG/ACT nasal spray Place 2 sprays into both nostrils 2 (two) times daily as needed for allergies or rhinitis.      ibuprofen (ADVIL) 200 MG tablet Take 2 tablets (400 mg total) by mouth every 6 (six) hours as needed for headache, mild pain or fever. 30 tablet 0   lactulose (CEPHULAC) 20 g packet Take 1 packet (20 g total) by mouth daily as needed. 30 each 0   LORazepam (ATIVAN) 0.5 MG tablet Take 1 tablet (0.5 mg total) by mouth 2 (two) times daily. 30 tablet 0   meloxicam (MOBIC) 15 MG tablet Take 15 mg by mouth daily.     methocarbamol (ROBAXIN) 500 MG tablet Take 1 tablet (500 mg total) by mouth every 6 (six) hours as needed for muscle spasms. 28 tablet 0   Multiple Vitamin (MULTIVITAMIN WITH MINERALS) TABS tablet Take 1 tablet by mouth daily.     naloxone (NARCAN) 0.4 MG/ML injection Inject 0.4 mg into the vein as needed.     ondansetron (ZOFRAN-ODT) 8 MG disintegrating tablet Take 1 tablet (8 mg total) by mouth every 8 (eight) hours as needed for nausea or vomiting.     oxyCODONE (OXY IR/ROXICODONE) 5 MG immediate release tablet Take 1 tablet (5 mg total) by mouth every 6 (six) hours as needed for breakthrough pain (pain despite tramadol). 30 tablet 0   pantoprazole (PROTONIX) 40 MG tablet Take 1 tablet (40 mg total) by mouth 2 (two) times daily. 60 tablet 1   polyethylene glycol powder (GLYCOLAX/MIRALAX) 17 GM/SCOOP powder Take 1 Container by mouth once.     rosuvastatin (CRESTOR) 20 MG tablet Take 20 mg by mouth at bedtime.      senna-docusate (SENOKOT-S) 8.6-50 MG tablet Take 2 tablets by mouth 2 (two) times daily as needed for mild constipation. 100 tablet 0   tiZANidine (ZANAFLEX) 2 MG tablet Take 2 mg by mouth 3 (three) times daily.     traMADol (ULTRAM) 50 MG tablet Take 1 tablet (50 mg total) by mouth 2 (two) times daily. 30 tablet 0   triamcinolone (KENALOG) 0.025 % cream Apply 1 Application topically 2 (two) times daily.     No current facility-administered medications for this visit.    OBJECTIVE: Vitals:   02/24/23 1058  BP: 115/69  Pulse: 80  Resp: (!) 22  Temp: 97.7 F (36.5 C)  SpO2: 93%     Body mass index is 29.91 kg/m.    ECOG FS:3 - Symptomatic, >50% confined to bed  General: Ill-appearing, no acute distress.  Sitting in a wheelchair. Eyes: Pink conjunctiva, anicteric sclera. HEENT:  Normocephalic, moist mucous membranes. Lungs: No audible wheezing or coughing. Heart: Regular rate and rhythm. Abdomen: Soft, nontender, no obvious distention. Musculoskeletal: No edema, cyanosis, or clubbing. Neuro: Alert, answering all questions appropriately. Cranial nerves grossly intact. Skin: No rashes or petechiae noted. Psych: Normal affect. Lymphatics: No cervical, calvicular, axillary or inguinal LAD.   LAB RESULTS:  Lab Results  Component Value Date   NA 136 02/17/2023   K 4.7 02/17/2023   CL 104 02/17/2023   CO2 26 02/17/2023   GLUCOSE 103 (H) 02/17/2023   BUN 12 02/17/2023   CREATININE 0.71 02/17/2023   CALCIUM 8.6 (L) 02/17/2023   PROT 6.8 02/08/2023   ALBUMIN 3.0 (L) 02/08/2023   AST 23 02/08/2023   ALT 13 02/08/2023   ALKPHOS 70 02/08/2023   BILITOT 0.9 02/08/2023   GFRNONAA >60 02/17/2023   GFRAA 52 (L) 03/07/2020    Lab Results  Component Value Date   WBC 11.3 (H) 02/24/2023   NEUTROABS 6.9 02/17/2023   HGB 14.6 02/24/2023   HCT 44.9 02/24/2023   MCV 87.4 02/24/2023   PLT 321 02/24/2023     STUDIES: CT ABDOMEN PELVIS W CONTRAST  Result Date:  02/14/2023 CLINICAL DATA:  Diverticulitis, complication suspected EXAM: CT ABDOMEN AND PELVIS WITH CONTRAST TECHNIQUE: Multidetector CT imaging of the abdomen and pelvis was performed using the standard protocol following bolus administration of intravenous contrast. RADIATION DOSE REDUCTION: This exam was performed according to the departmental dose-optimization program which includes automated exposure control, adjustment of the mA and/or kV according to patient size and/or use of iterative reconstruction technique. CONTRAST:  OMNIPAQUE IOHEXOL 300 MG/ML  SOLN COMPARISON:  02/08/2023 FINDINGS: Lower chest: Bibasilar subsegmental atelectasis. Hepatobiliary: No focal liver abnormality. Prior cholecystectomy. Unchanged intra and extrahepatic biliary dilatation. Pancreas: Unremarkable. No pancreatic ductal dilatation or surrounding inflammatory changes. Spleen: Normal in size without focal abnormality. Adrenals/Urinary Tract: Unremarkable adrenal glands. Kidneys enhance symmetrically without focal lesion, stone, or hydronephrosis. Ureters are nondilated. Urinary bladder appears unremarkable for the degree of distention. Stomach/Bowel: Stomach is within normal limits. No dilated loops of bowel. Persistent but improving changes of mild uncomplicated diverticulitis of the mid sigmoid colon. No pericolonic fluid collection or extraluminal air. Extensive diverticulosis throughout the left colon. No additional sites of bowel wall thickening or inflammatory changes. Vascular/Lymphatic: Aortic atherosclerosis. No enlarged abdominal or pelvic lymph nodes. Reproductive: Status post hysterectomy. No adnexal masses. Other: No free fluid. No abdominopelvic fluid collection. No pneumoperitoneum. No abdominal wall hernia. Musculoskeletal: No new or acute bony abnormality. IMPRESSION: 1. Persistent but improving changes of mild uncomplicated diverticulitis of the mid sigmoid colon. No pericolonic fluid collection or  extraluminal air. 2. Remainder of the examination is unchanged. Electronically Signed   By: Duanne Guess D.O.   On: 02/14/2023 15:22   US Venous Img Upper Uni Left (DVT)  Result Date: 02/13/2023 CLINICAL DATA:  Left upper extremity swelling EXAM: LEFT UPPER EXTREMITY VENOUS DOPPLER ULTRASOUND TECHNIQUE: Gray-scale sonography with graded compression, as well as color Doppler and duplex ultrasound were performed to evaluate the upper extremity deep venous system from the level of the subclavian vein and including the jugular, axillary, basilic, radial, ulnar and upper cephalic vein. Spectral Doppler was utilized to evaluate flow at rest and with distal augmentation maneuvers. COMPARISON:  None Available. FINDINGS: Contralateral Subclavian Vein: Respiratory phasicity is normal and symmetric with the symptomatic side. No evidence of thrombus. Normal compressibility. Internal Jugular Vein: No evidence of thrombus. Normal compressibility, respiratory phasicity and response to augmentation. Subclavian Vein:  No evidence of thrombus. Normal compressibility, respiratory phasicity and response to augmentation. Axillary Vein: No evidence of thrombus. Normal compressibility, respiratory phasicity and response to augmentation. Cephalic Vein: No evidence of thrombus. Normal compressibility, respiratory phasicity and response to augmentation. Basilic Vein: No evidence of thrombus. Normal compressibility, respiratory phasicity and response to augmentation. Brachial Veins: No evidence of thrombus. Normal compressibility, respiratory phasicity and response to augmentation. Radial Veins: No evidence of thrombus. Normal compressibility, respiratory phasicity and response to augmentation. Ulnar Veins: No evidence of thrombus. Normal compressibility, respiratory phasicity and response to augmentation. Venous Reflux:  None visualized. Other Findings:  None visualized. IMPRESSION: No evidence of DVT within the left upper extremity.  Electronically Signed   By: Malachy Moan M.D.   On: 02/13/2023 13:39   CT ABDOMEN PELVIS W CONTRAST  Result Date: 02/08/2023 CLINICAL DATA:  Abdominal pain for 1 week, initial encounter EXAM: CT ABDOMEN AND PELVIS WITH CONTRAST TECHNIQUE: Multidetector CT imaging of the abdomen and pelvis was performed using the standard protocol following bolus administration of intravenous contrast. RADIATION DOSE REDUCTION: This exam was performed according to the departmental dose-optimization program which includes automated exposure control, adjustment of the mA and/or kV according to patient size and/or use of iterative reconstruction technique. CONTRAST:  OMNIPAQUE IOHEXOL 300 MG/ML  SOLN COMPARISON:  08/21/2022 FINDINGS: Lower chest: Mild linear scarring is noted in the bases bilaterally. This is stable from the prior exam. Hepatobiliary: Gallbladder has been surgically removed. Prominence of the intrahepatic and extrahepatic biliary tree is noted consistent with the post cholecystectomy state. The liver is within normal limits. Pancreas: Unremarkable. No pancreatic ductal dilatation or surrounding inflammatory changes. Spleen: Normal in size without focal abnormality. Adrenals/Urinary Tract: Adrenal glands are within normal limits. Kidneys demonstrate a normal enhancement pattern bilaterally. No renal calculi or urinary tract obstructive changes are seen. Normal excretion is noted on delayed images. The bladder is decompressed. Stomach/Bowel: Diverticular change of the colon is noted with new diverticulitis in the distal more proximal sigmoid is within normal limits. Proximal colon shows no obstructive changes. The appendix has been surgically removed. Small bowel and stomach are unremarkable. Vascular/Lymphatic: Aortic atherosclerosis. No enlarged abdominal or pelvic lymph nodes. Reproductive: Status post hysterectomy. No adnexal masses. Other: No abdominal wall hernia or abnormality. No abdominopelvic  ascites. Musculoskeletal: No acute or significant osseous findings. IMPRESSION: Changes consistent with sigmoid diverticulitis. No evidence of perforation or focal abscess is seen. Status post cholecystectomy. Electronically Signed   By: Alcide Clever M.D.   On: 02/08/2023 20:37    ASSESSMENT: Erythrocytosis.  PLAN:    Erythrocytosis:  Resolved.  Patient's hemoglobin is within normal limits today at 14.6.  Iron stores, carbon monoxide level, and JAK2 mutation have been ordered for completeness and are pending at time of dictation.  No intervention is needed at this time.  Currently, patient has follow-up in 3 weeks but if all of her laboratory work returns within normal limits this appointment can likely be canceled. Leukocytosis: Likely reactive.  No intervention needed. Nausea: Unclear etiology.  Unrelated to erythrocytosis.  Have recommended patient continue follow-up with her primary care physician.  I spent a total of 45 minutes reviewing chart data, face-to-face evaluation with the patient, counseling and coordination of care as detailed above.   Patient expressed understanding and was in agreement with this plan. She also understands that She can call clinic at any time with any questions, concerns, or complaints.     Jeralyn Ruths, MD   02/24/2023 12:12 PM

## 2023-02-25 LAB — CARBON MONOXIDE, BLOOD (PERFORMED AT REF LAB): Carbon Monoxide, Blood: 2.6 % (ref 0.0–3.6)

## 2023-03-07 ENCOUNTER — Inpatient Hospital Stay: Payer: Medicare HMO

## 2023-03-07 ENCOUNTER — Encounter: Payer: Self-pay | Admitting: Internal Medicine

## 2023-03-07 ENCOUNTER — Other Ambulatory Visit: Payer: Self-pay

## 2023-03-07 ENCOUNTER — Emergency Department: Payer: Medicare HMO

## 2023-03-07 ENCOUNTER — Inpatient Hospital Stay
Admission: EM | Admit: 2023-03-07 | Discharge: 2023-03-17 | DRG: 164 | Disposition: A | Discharge status: Skilled Nursing Facility | Payer: Medicare HMO | Attending: Osteopathic Medicine | Admitting: Osteopathic Medicine

## 2023-03-07 DIAGNOSIS — D751 Secondary polycythemia: Secondary | ICD-10-CM | POA: Diagnosis present

## 2023-03-07 DIAGNOSIS — I50810 Right heart failure, unspecified: Secondary | ICD-10-CM | POA: Diagnosis not present

## 2023-03-07 DIAGNOSIS — I2609 Other pulmonary embolism with acute cor pulmonale: Secondary | ICD-10-CM | POA: Diagnosis not present

## 2023-03-07 DIAGNOSIS — E876 Hypokalemia: Secondary | ICD-10-CM | POA: Diagnosis present

## 2023-03-07 DIAGNOSIS — I82411 Acute embolism and thrombosis of right femoral vein: Secondary | ICD-10-CM | POA: Diagnosis present

## 2023-03-07 DIAGNOSIS — I2699 Other pulmonary embolism without acute cor pulmonale: Secondary | ICD-10-CM

## 2023-03-07 DIAGNOSIS — Z8249 Family history of ischemic heart disease and other diseases of the circulatory system: Secondary | ICD-10-CM | POA: Diagnosis not present

## 2023-03-07 DIAGNOSIS — R112 Nausea with vomiting, unspecified: Secondary | ICD-10-CM | POA: Diagnosis present

## 2023-03-07 DIAGNOSIS — I2692 Saddle embolus of pulmonary artery without acute cor pulmonale: Principal | ICD-10-CM | POA: Diagnosis present

## 2023-03-07 DIAGNOSIS — Z882 Allergy status to sulfonamides status: Secondary | ICD-10-CM

## 2023-03-07 DIAGNOSIS — I82451 Acute embolism and thrombosis of right peroneal vein: Secondary | ICD-10-CM | POA: Diagnosis present

## 2023-03-07 DIAGNOSIS — Z1611 Resistance to penicillins: Secondary | ICD-10-CM | POA: Diagnosis present

## 2023-03-07 DIAGNOSIS — K219 Gastro-esophageal reflux disease without esophagitis: Secondary | ICD-10-CM | POA: Diagnosis present

## 2023-03-07 DIAGNOSIS — N1831 Chronic kidney disease, stage 3a: Secondary | ICD-10-CM | POA: Diagnosis present

## 2023-03-07 DIAGNOSIS — G8929 Other chronic pain: Secondary | ICD-10-CM | POA: Diagnosis present

## 2023-03-07 DIAGNOSIS — F419 Anxiety disorder, unspecified: Secondary | ICD-10-CM | POA: Diagnosis present

## 2023-03-07 DIAGNOSIS — N390 Urinary tract infection, site not specified: Secondary | ICD-10-CM | POA: Diagnosis present

## 2023-03-07 DIAGNOSIS — R8281 Pyuria: Secondary | ICD-10-CM | POA: Insufficient documentation

## 2023-03-07 DIAGNOSIS — R531 Weakness: Secondary | ICD-10-CM

## 2023-03-07 DIAGNOSIS — B9620 Unspecified Escherichia coli [E. coli] as the cause of diseases classified elsewhere: Secondary | ICD-10-CM | POA: Diagnosis present

## 2023-03-07 DIAGNOSIS — Z7901 Long term (current) use of anticoagulants: Secondary | ICD-10-CM | POA: Diagnosis not present

## 2023-03-07 DIAGNOSIS — I82431 Acute embolism and thrombosis of right popliteal vein: Secondary | ICD-10-CM | POA: Diagnosis present

## 2023-03-07 DIAGNOSIS — G2581 Restless legs syndrome: Secondary | ICD-10-CM | POA: Insufficient documentation

## 2023-03-07 DIAGNOSIS — Z66 Do not resuscitate: Secondary | ICD-10-CM | POA: Diagnosis present

## 2023-03-07 DIAGNOSIS — K5732 Diverticulitis of large intestine without perforation or abscess without bleeding: Secondary | ICD-10-CM | POA: Diagnosis present

## 2023-03-07 DIAGNOSIS — F32A Depression, unspecified: Secondary | ICD-10-CM | POA: Diagnosis present

## 2023-03-07 DIAGNOSIS — H9190 Unspecified hearing loss, unspecified ear: Secondary | ICD-10-CM | POA: Diagnosis present

## 2023-03-07 DIAGNOSIS — Z79899 Other long term (current) drug therapy: Secondary | ICD-10-CM | POA: Diagnosis not present

## 2023-03-07 DIAGNOSIS — I2602 Saddle embolus of pulmonary artery with acute cor pulmonale: Secondary | ICD-10-CM | POA: Diagnosis not present

## 2023-03-07 DIAGNOSIS — Z1619 Resistance to other specified beta lactam antibiotics: Secondary | ICD-10-CM | POA: Diagnosis present

## 2023-03-07 DIAGNOSIS — M1990 Unspecified osteoarthritis, unspecified site: Secondary | ICD-10-CM | POA: Diagnosis present

## 2023-03-07 DIAGNOSIS — I129 Hypertensive chronic kidney disease with stage 1 through stage 4 chronic kidney disease, or unspecified chronic kidney disease: Secondary | ICD-10-CM | POA: Diagnosis present

## 2023-03-07 DIAGNOSIS — Z880 Allergy status to penicillin: Secondary | ICD-10-CM

## 2023-03-07 DIAGNOSIS — Z751 Person awaiting admission to adequate facility elsewhere: Secondary | ICD-10-CM

## 2023-03-07 DIAGNOSIS — R0902 Hypoxemia: Secondary | ICD-10-CM | POA: Diagnosis present

## 2023-03-07 DIAGNOSIS — Z791 Long term (current) use of non-steroidal anti-inflammatories (NSAID): Secondary | ICD-10-CM

## 2023-03-07 DIAGNOSIS — Z9889 Other specified postprocedural states: Secondary | ICD-10-CM | POA: Diagnosis not present

## 2023-03-07 DIAGNOSIS — R110 Nausea: Secondary | ICD-10-CM

## 2023-03-07 DIAGNOSIS — Z9049 Acquired absence of other specified parts of digestive tract: Secondary | ICD-10-CM

## 2023-03-07 DIAGNOSIS — I10 Essential (primary) hypertension: Secondary | ICD-10-CM | POA: Diagnosis present

## 2023-03-07 DIAGNOSIS — I82441 Acute embolism and thrombosis of right tibial vein: Secondary | ICD-10-CM | POA: Diagnosis present

## 2023-03-07 DIAGNOSIS — Z95828 Presence of other vascular implants and grafts: Secondary | ICD-10-CM | POA: Diagnosis not present

## 2023-03-07 LAB — COMPREHENSIVE METABOLIC PANEL
ALT: 17 U/L (ref 0–44)
AST: 26 U/L (ref 15–41)
Albumin: 3.3 g/dL — ABNORMAL LOW (ref 3.5–5.0)
Alkaline Phosphatase: 80 U/L (ref 38–126)
Anion gap: 10 (ref 5–15)
BUN: 8 mg/dL (ref 8–23)
CO2: 23 mmol/L (ref 22–32)
Calcium: 8.8 mg/dL — ABNORMAL LOW (ref 8.9–10.3)
Chloride: 107 mmol/L (ref 98–111)
Creatinine, Ser: 1.16 mg/dL — ABNORMAL HIGH (ref 0.44–1.00)
GFR, Estimated: 46 mL/min — ABNORMAL LOW (ref >60)
Glucose, Bld: 100 mg/dL — ABNORMAL HIGH (ref 70–99)
Potassium: 3 mmol/L — ABNORMAL LOW (ref 3.5–5.1)
Sodium: 140 mmol/L (ref 135–145)
Total Bilirubin: 1.1 mg/dL (ref 0.3–1.2)
Total Protein: 6.6 g/dL (ref 6.5–8.1)

## 2023-03-07 LAB — CBC WITH DIFFERENTIAL/PLATELET
Abs Immature Granulocytes: 0.04 10*3/uL (ref 0.00–0.07)
Basophils Absolute: 0.1 10*3/uL (ref 0.0–0.1)
Basophils Relative: 1 %
Eosinophils Absolute: 0.2 10*3/uL (ref 0.0–0.5)
Eosinophils Relative: 2 %
HCT: 44.6 % (ref 36.0–46.0)
Hemoglobin: 14.9 g/dL (ref 12.0–15.0)
Immature Granulocytes: 0 %
Lymphocytes Relative: 11 %
Lymphs Abs: 1 10*3/uL (ref 0.7–4.0)
MCH: 28.6 pg (ref 26.0–34.0)
MCHC: 33.4 g/dL (ref 30.0–36.0)
MCV: 85.6 fL (ref 80.0–100.0)
Monocytes Absolute: 0.5 10*3/uL (ref 0.1–1.0)
Monocytes Relative: 6 %
Neutro Abs: 7.7 10*3/uL (ref 1.7–7.7)
Neutrophils Relative %: 80 %
Platelets: 258 10*3/uL (ref 150–400)
RBC: 5.21 MIL/uL — ABNORMAL HIGH (ref 3.87–5.11)
RDW: 14.5 % (ref 11.5–15.5)
WBC: 9.5 10*3/uL (ref 4.0–10.5)
nRBC: 0 % (ref 0.0–0.2)

## 2023-03-07 LAB — URINALYSIS, W/ REFLEX TO CULTURE (INFECTION SUSPECTED)
Bilirubin Urine: NEGATIVE
Glucose, UA: NEGATIVE mg/dL
Hgb urine dipstick: NEGATIVE
Ketones, ur: 5 mg/dL — AB
Nitrite: NEGATIVE
Protein, ur: NEGATIVE mg/dL
Specific Gravity, Urine: 1.035 — ABNORMAL HIGH (ref 1.005–1.030)
pH: 8 (ref 5.0–8.0)

## 2023-03-07 LAB — MAGNESIUM: Magnesium: 2.1 mg/dL (ref 1.7–2.4)

## 2023-03-07 LAB — LIPASE, BLOOD: Lipase: 26 U/L (ref 11–51)

## 2023-03-07 LAB — PROCALCITONIN: Procalcitonin: <0.1 ng/mL

## 2023-03-07 MED ORDER — IOHEXOL 300 MG/ML  SOLN
100.0000 mL | Freq: Once | INTRAMUSCULAR | Status: AC | PRN
  Administered 2023-03-07: 100 mL via INTRAVENOUS

## 2023-03-07 MED ORDER — PANTOPRAZOLE SODIUM 40 MG IV SOLR
40.0000 mg | Freq: Every day | INTRAVENOUS | Status: AC
  Administered 2023-03-07 – 2023-03-08 (×2): 40 mg via INTRAVENOUS
  Filled 2023-03-07 (×2): qty 10

## 2023-03-07 MED ORDER — POTASSIUM CHLORIDE 20 MEQ PO PACK
40.0000 meq | PACK | Freq: Every day | ORAL | Status: DC
  Administered 2023-03-07: 40 meq via ORAL
  Filled 2023-03-07: qty 2

## 2023-03-07 MED ORDER — TIZANIDINE HCL 2 MG PO TABS
2.0000 mg | ORAL_TABLET | Freq: Three times a day (TID) | ORAL | Status: DC | PRN
  Administered 2023-03-07 – 2023-03-11 (×4): 2 mg via ORAL
  Filled 2023-03-07 (×6): qty 1

## 2023-03-07 MED ORDER — OXYCODONE HCL 5 MG PO TABS
5.0000 mg | ORAL_TABLET | Freq: Four times a day (QID) | ORAL | Status: DC | PRN
  Administered 2023-03-08 – 2023-03-17 (×11): 5 mg via ORAL
  Filled 2023-03-07 (×10): qty 1

## 2023-03-07 MED ORDER — ENSURE ENLIVE PO LIQD
237.0000 mL | Freq: Two times a day (BID) | ORAL | Status: DC
  Administered 2023-03-08 – 2023-03-16 (×4): 237 mL via ORAL

## 2023-03-07 MED ORDER — ROSUVASTATIN CALCIUM 20 MG PO TABS
20.0000 mg | ORAL_TABLET | Freq: Every day | ORAL | Status: DC
  Administered 2023-03-07 – 2023-03-16 (×10): 20 mg via ORAL
  Filled 2023-03-07: qty 1
  Filled 2023-03-07 (×7): qty 2
  Filled 2023-03-07: qty 1
  Filled 2023-03-07: qty 2

## 2023-03-07 MED ORDER — DROPERIDOL 2.5 MG/ML IJ SOLN
1.2500 mg | Freq: Once | INTRAMUSCULAR | Status: AC
  Administered 2023-03-07: 1.25 mg via INTRAVENOUS
  Filled 2023-03-07: qty 2

## 2023-03-07 MED ORDER — SODIUM CHLORIDE 0.9 % IV SOLN
12.5000 mg | Freq: Four times a day (QID) | INTRAVENOUS | Status: DC | PRN
  Filled 2023-03-07: qty 0.5

## 2023-03-07 MED ORDER — ONDANSETRON HCL 4 MG/2ML IJ SOLN
4.0000 mg | Freq: Four times a day (QID) | INTRAMUSCULAR | Status: AC | PRN
  Administered 2023-03-08 – 2023-03-12 (×11): 4 mg via INTRAVENOUS
  Filled 2023-03-07 (×6): qty 2

## 2023-03-07 MED ORDER — TRAMADOL HCL 50 MG PO TABS
50.0000 mg | ORAL_TABLET | Freq: Two times a day (BID) | ORAL | Status: DC | PRN
  Administered 2023-03-09 – 2023-03-11 (×3): 50 mg via ORAL
  Filled 2023-03-07 (×2): qty 1

## 2023-03-07 MED ORDER — APIXABAN 5 MG PO TABS
10.0000 mg | ORAL_TABLET | Freq: Once | ORAL | Status: AC
  Administered 2023-03-07: 10 mg via ORAL
  Filled 2023-03-07: qty 2

## 2023-03-07 MED ORDER — ESCITALOPRAM OXALATE 10 MG PO TABS
20.0000 mg | ORAL_TABLET | Freq: Every day | ORAL | Status: DC
  Administered 2023-03-07 – 2023-03-17 (×11): 20 mg via ORAL
  Filled 2023-03-07 (×11): qty 2

## 2023-03-07 MED ORDER — ONDANSETRON HCL 4 MG PO TABS
4.0000 mg | ORAL_TABLET | Freq: Four times a day (QID) | ORAL | Status: AC | PRN
  Administered 2023-03-07 – 2023-03-11 (×3): 4 mg via ORAL
  Filled 2023-03-07 (×3): qty 1

## 2023-03-07 MED ORDER — AMLODIPINE BESYLATE 10 MG PO TABS
10.0000 mg | ORAL_TABLET | Freq: Every day | ORAL | Status: DC
  Administered 2023-03-07 – 2023-03-17 (×11): 10 mg via ORAL
  Filled 2023-03-07 (×6): qty 1
  Filled 2023-03-07: qty 2
  Filled 2023-03-07 (×4): qty 1

## 2023-03-07 MED ORDER — POTASSIUM CHLORIDE 10 MEQ/100ML IV SOLN
10.0000 meq | Freq: Once | INTRAVENOUS | Status: AC
  Administered 2023-03-07: 10 meq via INTRAVENOUS
  Filled 2023-03-07: qty 100

## 2023-03-07 MED ORDER — SENNOSIDES-DOCUSATE SODIUM 8.6-50 MG PO TABS
2.0000 | ORAL_TABLET | Freq: Two times a day (BID) | ORAL | Status: DC
  Administered 2023-03-07 – 2023-03-17 (×19): 2 via ORAL
  Filled 2023-03-07 (×20): qty 2

## 2023-03-07 MED ORDER — SODIUM CHLORIDE 0.9 % IV SOLN
1.0000 g | INTRAVENOUS | Status: DC
  Administered 2023-03-07 – 2023-03-08 (×2): 1 g via INTRAVENOUS
  Filled 2023-03-07 (×3): qty 10

## 2023-03-07 MED ORDER — ACETAMINOPHEN 650 MG RE SUPP: 650.0000 mg | Freq: Four times a day (QID) | RECTAL | Status: DC | PRN

## 2023-03-07 MED ORDER — ACETAMINOPHEN 325 MG PO TABS
650.0000 mg | ORAL_TABLET | Freq: Four times a day (QID) | ORAL | Status: DC | PRN
  Administered 2023-03-08 – 2023-03-10 (×4): 650 mg via ORAL
  Filled 2023-03-07 (×4): qty 2

## 2023-03-07 MED ORDER — ROPINIROLE HCL 1 MG PO TABS
0.5000 mg | ORAL_TABLET | Freq: Every day | ORAL | Status: AC
  Administered 2023-03-07 – 2023-03-08 (×2): 0.5 mg via ORAL
  Filled 2023-03-07: qty 1
  Filled 2023-03-07: qty 2

## 2023-03-07 MED ORDER — SODIUM CHLORIDE 0.9 % IV SOLN
6.2500 mg | Freq: Once | INTRAVENOUS | Status: AC
  Administered 2023-03-07: 6.25 mg via INTRAVENOUS
  Filled 2023-03-07: qty 0.25

## 2023-03-07 MED ORDER — LACTATED RINGERS IV SOLN: INTRAVENOUS | Status: DC

## 2023-03-07 MED ORDER — APIXABAN 5 MG PO TABS
10.0000 mg | ORAL_TABLET | Freq: Two times a day (BID) | ORAL | Status: DC
  Administered 2023-03-07 – 2023-03-08 (×2): 10 mg via ORAL
  Filled 2023-03-07 (×2): qty 2

## 2023-03-07 MED ORDER — FENTANYL CITRATE PF 50 MCG/ML IJ SOSY: 50.0000 ug | PREFILLED_SYRINGE | Freq: Once | INTRAMUSCULAR | Status: DC

## 2023-03-07 MED ORDER — SODIUM CHLORIDE 0.9 % IV BOLUS
1000.0000 mL | Freq: Once | INTRAVENOUS | Status: AC
  Administered 2023-03-07: 1000 mL via INTRAVENOUS

## 2023-03-07 MED ORDER — SENNOSIDES-DOCUSATE SODIUM 8.6-50 MG PO TABS: 1.0000 | ORAL_TABLET | Freq: Every evening | ORAL | Status: DC | PRN

## 2023-03-07 MED ORDER — LORAZEPAM 0.5 MG PO TABS
0.5000 mg | ORAL_TABLET | Freq: Four times a day (QID) | ORAL | Status: DC | PRN
  Administered 2023-03-07 – 2023-03-17 (×20): 0.5 mg via ORAL
  Filled 2023-03-07 (×23): qty 1

## 2023-03-07 MED ORDER — HYDRALAZINE HCL 20 MG/ML IJ SOLN: 5.0000 mg | Freq: Three times a day (TID) | INTRAMUSCULAR | Status: AC | PRN

## 2023-03-07 MED ORDER — APIXABAN 5 MG PO TABS: 5.0000 mg | ORAL_TABLET | Freq: Two times a day (BID) | ORAL | Status: DC

## 2023-03-07 NOTE — ED Notes (Signed)
Patient transported to CT 

## 2023-03-07 NOTE — H&P (Addendum)
History and Physical   Marilyn Andrews BMW:[id number] DOB: 07-17-1936 DOA: 03/07/2023  PCP: [facility]  Patient coming from: [facility] via EMS  I have personally briefly reviewed patient's old medical records in [facility] EMR.  Chief Concern: Intractable nausea and vomiting  HPI: [name] is a 87 year old female with history of diverticulosis with diverticulitis, hypertension, GERD, generalized weakness, chronic pain low back and bilateral knee pain, chronic osteoarthritis, erythrocytosis, who presents to the emergency department from [facility] for chief concerns of intractable nausea and vomiting, left lower quadrant pain.  Vitals show temperature 98.4, respiration rate of 25, heart rate of 96, blood pressure 154/60, SpO2 of 93% on room air.  Serum sodium is 140, potassium 3.0, chloride 107, bicarb 23, BUN of 8, serum creatinine of 1.16, EGFR 46, nonfasting blood glucose 100, WBC 9.5, hemoglobin 14.9, platelets of 258.  UA was positive for small leukocytes.  Urine cultures in process.  Lipase was within normal limits.  ED treatment: Apixaban 10 mg p.o. one-time dose, potassium chloride 10 mill equivalent IV one-time dose, potassium chloride 40 mill equivalent p.o., Phenergan 6.  2 5 mg IV one-time dose, sodium chloride 1 L bolus, droperidol 1.25 mg IV one-time dose. --------------------------- At bedside, patient was able to tell me her name, age, location, current calendar year.  She reports she has feelings of generalized weakness, and had vomited twice at [facility].  She denies chest pain, shortness of breath at this time.  She endorses dysuria that started about 1 or 2 days ago.  She denies diarrhea, blood in her stool, blood in her urine.  She endorses a little loss of appetite.  She denies syncope, fever, chills, vision changes.  Social history: She is from [facility].  ROS: Constitutional: no weight change, no fever ENT/Mouth: no sore  throat, no rhinorrhea Eyes: no eye pain, no vision changes Cardiovascular: no chest pain, no dyspnea,  no edema, no palpitations Respiratory: no cough, no sputum, no wheezing Gastrointestinal: + nausea, + vomiting, no diarrhea, no constipation Genitourinary: no urinary incontinence, + dysuria, no hematuria Musculoskeletal: no arthralgias, no myalgias Skin: no skin lesions, no pruritus, Neuro: + weakness, no loss of consciousness, no syncope, restless leg Psych: no anxiety, no depression, + decrease appetite Heme/Lymph: no bruising, no bleeding  ED Course: Discussed with emergency medicine provider, patient requiring hospitalization for chief concerns intractable nausea and vomiting and incidental finding of right lower lobe pulmonary vessel thrombus concerning for PE.  Assessment/Plan  Principal Problem:   Intractable nausea and vomiting Active Problems:   Acute pulmonary embolism (HCC)   HTN (hypertension)   GERD (gastroesophageal reflux disease)   Depression   Generalized weakness   Chronic pain   Erythrocytosis   Hypokalemia   CKD stage 3a, GFR 45-59 ml/min (HCC)   Pyuria   Restless leg   Assessment and Plan:  * Intractable nausea and vomiting With initial shortness of breath per EDP, portable chest x-ray ordered stat to assess for aspiration pneumonia and/or community-acquired pneumonia Presumed secondary to acute PE Symptomatic support with ondansetron 4 mg p.o./IV as needed for nausea and vomiting; Phenergan 12.5 mg IV every 6 hours as needed for refractory nausea and vomiting, 18 hours of coverage ordered LR 150 mL/h, 1 day ordered Protonix 40 mg IV nightly, 2 doses ordered Telemetry cardiac, inpatient  Acute pulmonary embolism (HCC) Continue Eliquis 10 mg p.o. twice daily loading dose to complete 7-day course followed by Eliquis 5 mg p.o. twice daily  Dedicated CTA of the chest to assess for PE was not ordered due to patient receiving contrast already on CT abdomen  and pelvis and patient has borderline CKD 3A/3B, mild elevated serum creatinine not meeting AKI criteria Bilateral lower extremity ultrasound to assess for DVT ordered Status post sodium chloride 1 L bolus, I have ordered LR 150 mL/h, 1 day ordered Repeat BMP in the a.m., if serum creatinine is within normal limits, would recommend a.m. team to consider CTA of the chest for dedicated pulmonary embolism evaluation and consideration of complete echo if indicated Admit to telemetry cardiac, inpatient  Restless leg Patient states is difficult for her to keep her legs from moving I suspect patient has restless leg Initiation of Requip 0.5 mg nightly, first dose today, 2 doses ordered  Pyuria With patient endorsing dysuria, we will treat as UTI present on admission Ceftriaxone 1 g IV daily, 5 doses ordered  CKD stage 3a, GFR 45-59 ml/min (HCC) At baseline, with mild increase in serum creatinine and decrease in EGFR compared to prior, does not meet criteria for acute kidney injury; presumed secondary to prerenal, from GI loss BMP in the a.m.  Hypokalemia Suspect secondary to GI loss Check magnesium level on admission Status post potassium chloride 10 mill: IV one-time dose per EDP and potassium chloride 40 mill equivalent p.o. one-time dose per EDP BMP in the AM  Chronic pain Home tizanidine 2 mg every 8 hours as needed for muscle spasms resumed Tramadol 50 mg twice daily as needed for moderate pain resumed  Generalized weakness Fall precautions, aspiration, PT, OT, TOC consulted  Depression Escitalopram 20 mg daily resumed Lorazepam 0.5 mg every 6 hours as needed for anxiety resumed  GERD (gastroesophageal reflux disease) Protonix 40 mg IV nightly, 2 days ordered in setting of intractable nausea and vomiting  HTN (hypertension) Mildly elevated presumed secondary to intractable nausea and vomiting and all serum PE on CT abdomen pelvis Amlodipine 10 mg daily resumed Hydralazine 5 mg  IV every 8 hours as needed for SBP greater 175, 4 days ordered  Chart reviewed.   DVT prophylaxis: Eliquis Code Status: Full code Diet: Heart healthy Family Communication: Called Blane Clementson, patient's son at patient's request to update him regarding her admission and diagnosis. Disposition Plan: Pending clinical course Consults called: None at this time Admission status: Telemetry cardiac, inpatient  Past Medical History:  Diagnosis Date   Acute diverticulitis 03/07/2018   Anxiety    Arthritis    Depression    Diverticulitis    Dyspnea    Edema    FEET/LEGS   HOH (hard of hearing)    Hypertension    Hypokalemia 02/09/2023   Past Surgical History:  Procedure Laterality Date   ABDOMINAL HYSTERECTOMY     APPENDECTOMY     BACK SURGERY     CATARACT EXTRACTION W/PHACO Right 08/30/2018   Procedure: CATARACT EXTRACTION PHACO AND INTRAOCULAR LENS PLACEMENT (IOC);  Surgeon: Galen Manila, MD;  Location: [facility] ORS;  Service: Ophthalmology;  Laterality: Right;  [place] 01:07.1 CDE 13.66 Fluid Pack Lot # [id number]   CATARACT EXTRACTION W/PHACO Left 09/20/2018   Procedure: CATARACT EXTRACTION PHACO AND INTRAOCULAR LENS PLACEMENT (IOC);  Surgeon: Galen Manila, MD;  Location: [facility] ORS;  Service: Ophthalmology;  Laterality: Left;  [place] 01:09 CDE 11.45 fluid pack lot # [id number]   CHOLECYSTECTOMY     HYSTERECTOMY ABDOMINAL WITH SALPINGECTOMY     Social History:  reports that she has never smoked. She has never used smokeless tobacco. She  reports that she does not drink alcohol and does not use drugs.  Allergies  Allergen Reactions   Penicillins Other (See Comments)    Reaction as a child, patient does not recall  Has patient had a PCN reaction causing immediate rash, facial/tongue/throat swelling, SOB or lightheadedness with hypotension: unknown Has patient had a PCN reaction causing severe rash involving mucus membranes or skin necrosis: unknown Has patient had a PCN reaction that  required hospitalization: unknown Has patient had a PCN reaction occurring within the last 10 years: No If all of the above answers are "NO", then may proceed with Cephalosporin use.   Sulfa Antibiotics Nausea Only   Family History  Problem Relation Age of Onset   Hypertension Father    CAD Brother    Family history: Family history reviewed and not pertinent.  Prior to Admission medications   Medication Sig Start Date End Date Taking? Authorizing Provider  acetaminophen (TYLENOL) 500 MG tablet Take 1,000 mg by mouth every 6 (six) hours as needed for mild pain or moderate pain.    Yes [provider]  amLODipine (NORVASC) 10 MG tablet Take 1 tablet (10 mg total) by mouth daily. 02/19/23  Yes Esaw Grandchild A, DO  diphenhydrAMINE (BENADRYL) 50 MG tablet Take 1 tablet (50 mg total) by mouth at bedtime as needed for itching. 09/21/20  Yes Esaw Grandchild A, DO  escitalopram (LEXAPRO) 20 MG tablet Take 1 tablet (20 mg total) by mouth daily. 03/14/18  Yes Katha Hamming, MD  fluticasone (FLONASE) 50 MCG/ACT nasal spray Place 2 sprays into both nostrils 2 (two) times daily as needed for allergies or rhinitis.    Yes [provider]  ibuprofen (ADVIL) 200 MG tablet Take 2 tablets (400 mg total) by mouth every 6 (six) hours as needed for headache, mild pain or fever. 02/18/23  Yes Esaw Grandchild A, DO  LORazepam (ATIVAN) 0.5 MG tablet Take 1 tablet (0.5 mg total) by mouth 2 (two) times daily. Patient taking differently: Take 0.5 mg by mouth in the morning. 02/18/23  Yes Esaw Grandchild A, DO  meloxicam (MOBIC) 15 MG tablet Take 15 mg by mouth daily. 12/30/22  Yes [provider]  methocarbamol (ROBAXIN) 500 MG tablet Take 1 tablet (500 mg total) by mouth every 6 (six) hours as needed for muscle spasms. 09/21/20  Yes Pennie Banter, DO  Multiple Vitamin (MULTIVITAMIN WITH MINERALS) TABS tablet Take 1 tablet by mouth daily. 09/22/20  Yes Pennie Banter, DO  naloxone  [facility]) nasal spray 4 mg/0.1 mL Place 1 spray into the nose as needed (suspected opioid overdose).   Yes [provider]  ondansetron (ZOFRAN-ODT) 8 MG disintegrating tablet Take 1 tablet (8 mg total) by mouth every 8 (eight) hours as needed for nausea or vomiting. 02/18/23  Yes Esaw Grandchild A, DO  oxyCODONE (OXY IR/ROXICODONE) 5 MG immediate release tablet Take 1 tablet (5 mg total) by mouth every 6 (six) hours as needed for breakthrough pain (pain despite tramadol). 02/18/23  Yes Esaw Grandchild A, DO  pantoprazole (PROTONIX) 40 MG tablet Take 1 tablet (40 mg total) by mouth 2 (two) times daily. 09/21/20  Yes Esaw Grandchild A, DO  polyethylene glycol (MIRALAX / GLYCOLAX) 17 g packet Take 17 g by mouth once.   Yes [provider]  rosuvastatin (CRESTOR) 20 MG tablet Take 20 mg by mouth at bedtime.   Yes [provider]  senna-docusate (SENOKOT-S) 8.6-50 MG tablet Take 2 tablets by mouth 2 (two) times daily  as needed for mild constipation. Patient taking differently: Take 2 tablets by mouth 2 (two) times daily. 03/08/20  Yes Marrion Coy, MD  tiZANidine (ZANAFLEX) 2 MG tablet Take 2 mg by mouth 3 (three) times daily.   Yes [provider]  traMADol (ULTRAM) 50 MG tablet Take 1 tablet (50 mg total) by mouth 2 (two) times daily. Patient taking differently: Take 50 mg by mouth 2 (two) times daily as needed for moderate pain. 02/18/23  Yes Esaw Grandchild A, DO  triamcinolone (KENALOG) 0.025 % cream Apply 1 Application topically 2 (two) times daily.   Yes [provider]  feeding supplement (ENSURE ENLIVE / ENSURE PLUS) LIQD Take 237 mLs by mouth 2 (two) times daily between meals. 09/22/20   Pennie Banter, DO   Physical Exam: Vitals:   03/07/23 1015 03/07/23 1030 03/07/23 1101 03/07/23 1316  BP:  (!) 154/60 (!) 160/65 137/67  Pulse:  96 94   Resp:   (!) 25   Temp:   98.4 F (36.9 C)   TempSrc:   Oral   SpO2: 95%  93%    Constitutional: appears  age-appropriate, frail, chronically ill, calm Eyes: PERRL, lids and conjunctivae normal ENMT: Mucous membranes are moist. Posterior pharynx clear of any exudate or lesions. Age-appropriate dentition.  Mild hearing loss Neck: normal, supple, no masses, no thyromegaly Respiratory: clear to auscultation bilaterally, no wheezing, no crackles. Normal respiratory effort. No accessory muscle use.  Cardiovascular: Regular rate and rhythm, no murmurs / rubs / gallops. No extremity edema. 2+ pedal pulses. No carotid bruits.  Abdomen: Obese abdomen, no tenderness, no masses palpated, no hepatosplenomegaly. Bowel sounds positive.  Musculoskeletal: no clubbing / cyanosis. No joint deformity upper and lower extremities. Good ROM, no contractures, no atrophy. Normal muscle tone.  Bilateral leg pain Skin: no rashes, lesions, ulcers. No induration Neurologic: Sensation intact. Strength 5/5 in all 4.  Psychiatric: Normal judgment and insight. Alert and oriented x 3. Normal mood.   EKG: independently reviewed, showing sinus rhythm with rate of 91, QTc 463  Chest x-ray on Admission: I personally reviewed and I agree with radiologist reading as below.  US Venous Img Lower Bilateral (DVT)  Result Date: 03/07/2023 CLINICAL DATA:  Pulmonary embolus. EXAM: BILATERAL LOWER EXTREMITY VENOUS DOPPLER ULTRASOUND TECHNIQUE: Gray-scale sonography with graded compression, as well as color Doppler and duplex ultrasound were performed to evaluate the lower extremity deep venous systems from the level of the common femoral vein and including the common femoral, femoral, profunda femoral, popliteal and calf veins including the posterior tibial, peroneal and gastrocnemius veins when visible. The superficial great saphenous vein was also interrogated. Spectral Doppler was utilized to evaluate flow at rest and with distal augmentation maneuvers in the common femoral, femoral and popliteal veins. COMPARISON:  None Available. FINDINGS:  RIGHT LOWER EXTREMITY Common Femoral Vein: No evidence of thrombus. Normal compressibility, respiratory phasicity and response to augmentation. Saphenofemoral Junction: No evidence of thrombus. Normal compressibility and flow on color Doppler imaging. Profunda Femoral Vein: No evidence of thrombus. Normal compressibility and flow on color Doppler imaging. Femoral Vein: Occlusive thrombus. Popliteal Vein: Occlusive thrombus. Calf Veins: Occlusive thrombus in the posterior tibial and peroneal veins. Superficial Great Saphenous Vein: No evidence of thrombus. Normal compressibility. Venous Reflux:  None. Other Findings:  None. LEFT LOWER EXTREMITY Common Femoral Vein: No evidence of thrombus. Normal compressibility, respiratory phasicity and response to augmentation. Saphenofemoral Junction: No evidence of thrombus. Normal compressibility and flow on color Doppler imaging. Profunda Femoral Vein: No  evidence of thrombus. Normal compressibility and flow on color Doppler imaging. Femoral Vein: No evidence of thrombus. Normal compressibility, respiratory phasicity and response to augmentation. Popliteal Vein: No evidence of thrombus. Normal compressibility, respiratory phasicity and response to augmentation. Calf Veins: No evidence of thrombus. Normal compressibility and flow on color Doppler imaging. Superficial Great Saphenous Vein: No evidence of thrombus. Normal compressibility. Venous Reflux:  None. Other Findings:  None. IMPRESSION: 1. Occlusive thrombus in the right femoral, popliteal, posterior tibial and peroneal veins. 2. No evidence of deep venous thrombosis in the left lower extremity. Electronically Signed   By: Orvan Falconer M.D.   On: 03/07/2023 14:51   DG Chest Port 1 View  Result Date: 03/07/2023 CLINICAL DATA:  Dyspnea on exertion. EXAM: PORTABLE CHEST 1 VIEW COMPARISON:  None Available. FINDINGS: Low lung volumes accentuate the pulmonary vasculature and cardiomediastinal silhouette. No consolidation  or edema. No pleural effusion or pneumothorax. Visualized bones and upper abdomen are unremarkable. IMPRESSION: No evidence of acute cardiopulmonary disease. Electronically Signed   By: Orvan Falconer M.D.   On: 03/07/2023 14:15   CT ABDOMEN PELVIS W CONTRAST  Result Date: 03/07/2023 CLINICAL DATA:  Left lower quadrant abdominal pain, diverticulitis. Complication suspected. EXAM: CT ABDOMEN AND PELVIS WITH CONTRAST TECHNIQUE: Multidetector CT imaging of the abdomen and pelvis was performed using the standard protocol following bolus administration of intravenous contrast. RADIATION DOSE REDUCTION: This exam was performed according to the departmental dose-optimization program which includes automated exposure control, adjustment of the mA and/or kV according to patient size and/or use of iterative reconstruction technique. CONTRAST:  OMNIPAQUE IOHEXOL 300 MG/ML  SOLN COMPARISON:  None Available. FINDINGS: Lower chest: There is a thrombus in the incompletely imaged right lower lobe pulmonary vessel concerning for pulmonary embolism. Hepatobiliary: No focal liver abnormality is seen. Status post cholecystectomy. Mild intrahepatic and prominent extrahepatic biliary duct, likely secondary to cholecystectomy. Pancreas: Unremarkable. No pancreatic ductal dilatation or surrounding inflammatory changes. Spleen: Normal in size without focal abnormality. Adrenals/Urinary Tract: Adrenal glands are unremarkable. Kidneys are normal, without renal calculi, focal lesion, or hydronephrosis. Bladder is unremarkable. Stomach/Bowel: Stomach is within normal limits. Appendix not identified. Colonic diverticulosis prominent in the sigmoid colon without evidence of acute diverticulitis. Mild thickening of the sigmoid colonic wall, likely secondary to severe diverticulosis. No abdominal collection or abscess. No extraluminal free air. Vascular/Lymphatic: Aortic atherosclerosis. No enlarged abdominal or pelvic lymph nodes.  Reproductive: Status post hysterectomy. No adnexal masses. Other: No abdominal wall hernia or abnormality. No abdominopelvic ascites. Musculoskeletal: Multilevel degenerate disc disease of the lumbar spine. No acute osseous abnormality. IMPRESSION: 1. Severe sigmoid diverticulosis without evidence of acute diverticulitis. Mild thickening of the sigmoid colonic wall, likely secondary to severe diverticulosis. No evidence of perforation or abscess. 2. There is a thrombus in the incompletely imaged right lower lobe pulmonary vessel concerning for pulmonary embolism. CT chest for further evaluation is suggested. 3. Status post cholecystectomy with mild intrahepatic and prominent extrahepatic biliary duct, likely secondary to cholecystectomy. 4. Aortic atherosclerosis. 5. Multilevel degenerate disc disease of the lumbar spine. Findings concerning for pulmonary embolism were called by telephone at the time of interpretation on 03/07/2023 at 9:49 am to provider [facility] , who verbally acknowledged these results. Electronically Signed   By: Larose Hires D.O.   On: 03/07/2023 09:49    Labs on Admission: I have personally reviewed following labs  CBC: Recent Labs  Lab 03/07/23 0813  WBC 9.5  NEUTROABS 7.7  HGB 14.9  HCT 44.6  MCV 85.6  PLT 258   Basic Metabolic Panel: Recent Labs  Lab 03/07/23 0813  NA 140  K 3.0*  CL 107  CO2 23  GLUCOSE 100*  BUN 8  CREATININE 1.16*  CALCIUM 8.8*  MG 2.1   GFR: CrCl cannot be calculated (Unknown ideal weight.).  Liver Function Tests: Recent Labs  Lab 03/07/23 0813  AST 26  ALT 17  ALKPHOS 80  BILITOT 1.1  PROT 6.6  ALBUMIN 3.3*   Recent Labs  Lab 03/07/23 0813  LIPASE 26   Urine analysis:    Component Value Date/Time   COLORURINE AMBER (A) 03/07/2023 1245   APPEARANCEUR HAZY (A) 03/07/2023 1245   APPEARANCEUR Hazy 03/27/2013 1114   LABSPEC 1.035 (H) 03/07/2023 1245   LABSPEC 1.026 03/27/2013 1114   PHURINE 8.0 03/07/2023 1245    GLUCOSEU NEGATIVE 03/07/2023 1245   GLUCOSEU Negative 03/27/2013 1114   HGBUR NEGATIVE 03/07/2023 1245   BILIRUBINUR NEGATIVE 03/07/2023 1245   BILIRUBINUR Negative 03/27/2013 1114   KETONESUR 5 (A) 03/07/2023 1245   PROTEINUR NEGATIVE 03/07/2023 1245   NITRITE NEGATIVE 03/07/2023 1245   LEUKOCYTESUR SMALL (A) 03/07/2023 1245   LEUKOCYTESUR Negative 03/27/2013 1114   This document was prepared using Dragon Voice Recognition software and may include unintentional dictation errors.  Dr. Sedalia Muta [facility]  If 7PM-7AM, please contact overnight-coverage provider If 7AM-7PM, please contact day attending provider [email or web address]  03/07/2023, 4:43 PM

## 2023-03-07 NOTE — Assessment & Plan Note (Signed)
Patient states is difficult for her to keep her legs from moving I suspect patient has restless leg Initiation of Requip 0.5 mg nightly, first dose today, 2 doses ordered

## 2023-03-07 NOTE — Assessment & Plan Note (Addendum)
Continue Eliquis 10 mg p.o. twice daily loading dose to complete 7-day course followed by Eliquis 5 mg p.o. twice daily Dedicated CTA of the chest to assess for PE was not ordered due to patient receiving contrast already on CT abdomen and pelvis and patient has borderline CKD 3A/3B, mild elevated serum creatinine not meeting AKI criteria Bilateral lower extremity ultrasound to assess for DVT ordered Status post sodium chloride 1 L bolus, I have ordered LR 150 mL/h, 1 day ordered Repeat BMP in the a.m., if serum creatinine is within normal limits, would recommend a.m. team to consider CTA of the chest for dedicated pulmonary embolism evaluation and consideration of complete echo if indicated Admit to telemetry cardiac, inpatient

## 2023-03-07 NOTE — Assessment & Plan Note (Signed)
With patient endorsing dysuria, we will treat as UTI present on admission Ceftriaxone 1 g IV daily, 5 doses ordered

## 2023-03-07 NOTE — Assessment & Plan Note (Addendum)
With initial shortness of breath per EDP, portable chest x-ray ordered stat to assess for aspiration pneumonia and/or community-acquired pneumonia Presumed secondary to acute PE Symptomatic support with ondansetron 4 mg p.o./IV as needed for nausea and vomiting; Phenergan 12.5 mg IV every 6 hours as needed for refractory nausea and vomiting, 18 hours of coverage ordered LR 150 mL/h, 1 day ordered Protonix 40 mg IV nightly, 2 doses ordered Telemetry cardiac, inpatient

## 2023-03-07 NOTE — ED Notes (Signed)
Pt c/o nausea, MD notified

## 2023-03-07 NOTE — Assessment & Plan Note (Signed)
At baseline, with mild increase in serum creatinine and decrease in EGFR compared to prior, does not meet criteria for acute kidney injury; presumed secondary to prerenal, from GI loss BMP in the a.m.

## 2023-03-07 NOTE — ED Triage Notes (Signed)
Pt arrives via EMS from [facility] for nausea/vomiting. Pt given PO zofran at 0630 by staff after 1 episode of emesis. Pt was seen here last week for diverticulitis. Pt has only had 1 episode of emesis. Pt says she still feels nauseous and the zofran didn't help.

## 2023-03-07 NOTE — ED Notes (Signed)
Called lab to add on mag.  

## 2023-03-07 NOTE — Assessment & Plan Note (Signed)
Mildly elevated presumed secondary to intractable nausea and vomiting and all serum PE on CT abdomen pelvis Amlodipine 10 mg daily resumed Hydralazine 5 mg IV every 8 hours as needed for SBP greater 175, 4 days ordered

## 2023-03-07 NOTE — Assessment & Plan Note (Addendum)
Home tizanidine 2 mg every 8 hours as needed for muscle spasms resumed Tramadol 50 mg twice daily as needed for moderate pain resumed

## 2023-03-07 NOTE — ED Provider Notes (Signed)
[facility] Provider Note   Event Date/Time   First MD Initiated Contact with Patient 03/07/23 [phone]     (approximate) History  Emesis and Nausea  HPI Marilyn Andrews is a 87 y.o. female with a past medical history of recurrent diverticulitis, major depression with psychotic features, UTI, hypertension, and GERD who presents complaining for nausea/vomiting for the past week.  Patient was given Zofran at 630 and had 1 episode of emesis after this dosing.  Patient was seen here last week and treated for diverticulitis.  Patient has only had 1 episode of emesis with EMS.  Patient states she still feels nauseous despite the Zofran administration. ROS: Patient currently denies any vision changes, tinnitus, difficulty speaking, facial droop, sore throat, chest pain, shortness of breath, abdominal pain, diarrhea, dysuria, or weakness/numbness/paresthesias in any extremity   Physical Exam  Triage Vital Signs: ED Triage Vitals  Enc Vitals Group     BP 03/07/23 0803 136/60     Pulse Rate 03/07/23 0803 78     Resp 03/07/23 0803 19     Temp 03/07/23 0803 98.8 F (37.1 C)     Temp Source 03/07/23 0803 Oral     SpO2 03/07/23 0800 96 %     Weight --      Height --      Head Circumference --      [facility] --      Pain Score 03/07/23 0804 3     Pain Loc --      Pain Edu? --      Excl. in GC? --    Most recent vital signs: Vitals:   03/08/23 0807 03/08/23 1143  BP: (!) 178/67 (!) 146/61  Pulse: 83 84  Resp: 18 20  Temp: 98.5 F (36.9 C) 98 F (36.7 C)  SpO2: 93% 91%   General: Awake, oriented x4. CV:  Good peripheral perfusion.  Resp:  Normal effort.  Abd:  No distention.  Other:  Elderly overweight Caucasian female laying in bed in no acute distress. ED Results / Procedures / Treatments  Labs (all labs ordered are listed, but only abnormal results are displayed) Labs Reviewed  COMPREHENSIVE METABOLIC PANEL - Abnormal; Notable for the following components:       Result Value   Potassium 3.0 (*)    Glucose, Bld 100 (*)    Creatinine, Ser 1.16 (*)    Calcium 8.8 (*)    Albumin 3.3 (*)    GFR, Estimated 46 (*)    All other components within normal limits  CBC WITH DIFFERENTIAL/PLATELET - Abnormal; Notable for the following components:   RBC 5.21 (*)    All other components within normal limits  URINALYSIS, W/ REFLEX TO CULTURE (INFECTION SUSPECTED) - Abnormal; Notable for the following components:   Color, Urine AMBER (*)    APPearance HAZY (*)    Specific Gravity, Urine 1.035 (*)    Ketones, ur 5 (*)    Leukocytes,Ua SMALL (*)    Bacteria, UA MANY (*)    All other components within normal limits  BASIC METABOLIC PANEL - Abnormal; Notable for the following components:   Potassium 3.1 (*)    BUN 5 (*)    Calcium 8.3 (*)    All other components within normal limits  IRON AND TIBC - Abnormal; Notable for the following components:   TIBC 218 (*)    All other components within normal limits  URINE CULTURE  LIPASE, BLOOD  MAGNESIUM  PROCALCITONIN  CBC  FERRITIN  APTT   EKG ED ECG REPORT I, Merwyn Katos, the attending physician, personally viewed and interpreted this ECG. Date: 03/07/2023 EKG Time: 1039 Rate: 99 Rhythm: normal sinus rhythm QRS Axis: normal Intervals: normal ST/T Wave abnormalities: normal Narrative Interpretation: no evidence of acute ischemia RADIOLOGY ED MD interpretation: CT of the abdomen pelvis with IV contrast interpreted independently by me and shows severe sigmoid diverticulosis without evidence of acute diverticulitis.  There is a thrombus in the incompletely imaged right lower lobe pulmonary vessel concerning for pulmonary embolism -Agree with radiology assessment Official radiology report(s): CT Angio Chest Pulmonary Embolism (PE) W or WO Contrast  Result Date: 03/08/2023 CLINICAL DATA:  Presents with nausea, vomiting, left lower quadrant pain. EXAM: CT ANGIOGRAPHY CHEST WITH CONTRAST TECHNIQUE:  Multidetector CT imaging of the chest was performed using the standard protocol during bolus administration of intravenous contrast. Multiplanar CT image reconstructions and MIPs were obtained to evaluate the vascular anatomy. RADIATION DOSE REDUCTION: This exam was performed according to the departmental dose-optimization program which includes automated exposure control, adjustment of the mA and/or kV according to patient size and/or use of iterative reconstruction technique. CONTRAST:  75mL OMNIPAQUE IOHEXOL 350 MG/ML SOLN COMPARISON:  Chest radiograph 1 day prior, CT abdomen/pelvis 1 day prior FINDINGS: Cardiovascular: There is acute saddle pulmonary embolism straddling the main pulmonary artery bifurcation with a moderate clot burden in both central pulmonary arteries extending to the segmental and sub segmental arteries, primarily involving the lower lobes. The clot on the left is primarily nonocclusive. The heart is enlarged. There is no pericardial effusion. There is mild calcified plaque in the nonaneurysmal thoracic aorta. There is evidence of right heart strain with right to left heart ratio measuring 1.18. Mediastinum/Nodes: The thyroid is unremarkable. The esophagus is grossly unremarkable. There is no mediastinal, hilar, or axillary lymphadenopathy. Lungs/Pleura: The trachea and central airways are patent. Lung volumes are low. Opacities in the lung bases likely reflect dependent subsegmental atelectasis. There is mild interlobular septal thickening in the lung apices. There is no focal airspace consolidation. There is no pleural effusion. There is no pneumothorax. There are no suspicious nodules. Upper Abdomen: The imaged portions of the upper abdominal viscera are unremarkable. Musculoskeletal: There is no acute osseous abnormality or suspicious osseous lesion. Review of the MIP images confirms the above findings. IMPRESSION: 1. Acute saddle pulmonary embolism with a moderate clot burden in both  central pulmonary arteries extending to the segmental and sub segmental arteries, primarily involving the lower lobes. There is evidence of right heart strain with right to left heart ratio measuring 1.18, consistent with at least submassive (intermediate risk) PE. The presence of right heart strain has been associated with an increased risk of morbidity and mortality. Please refer to the "Code PE Focused" order set in EPIC. 2. Cardiomegaly with mild interlobular septal thickening in the lung apices suggesting mild interstitial edema. Critical Value/emergent results were called by telephone at the time of interpretation on 03/08/2023 at 12:42 pm to provider Sunnie Nielsen , who verbally acknowledged these results. Electronically Signed   By: Lesia Hausen M.D.   On: 03/08/2023 12:42   PROCEDURES: Critical Care performed: Yes, see critical care procedure note(s) .1-3 Lead EKG Interpretation  Performed by: Merwyn Katos, MD Authorized by: Merwyn Katos, MD     Interpretation: normal     ECG rate:  71   ECG rate assessment: normal     Rhythm: sinus rhythm  Ectopy: none     Conduction: normal   CRITICAL CARE Performed by: Merwyn Katos  Total critical care time: 35 minutes  Critical care time was exclusive of separately billable procedures and treating other patients.  Critical care was necessary to treat or prevent imminent or life-threatening deterioration.  Critical care was time spent personally by me on the following activities: development of treatment plan with patient and/or surrogate as well as nursing, discussions with consultants, evaluation of patient's response to treatment, examination of patient, obtaining history from patient or surrogate, ordering and performing treatments and interventions, ordering and review of laboratory studies, ordering and review of radiographic studies, pulse oximetry and re-evaluation of patient's condition.  MEDICATIONS ORDERED IN  ED: Medications  rosuvastatin (CRESTOR) tablet 20 mg (20 mg Oral Given 03/07/23 2106)  amLODipine (NORVASC) tablet 10 mg (10 mg Oral Given 03/08/23 0833)  acetaminophen (TYLENOL) tablet 650 mg (650 mg Oral Given 03/08/23 1434)    Or  acetaminophen (TYLENOL) suppository 650 mg ( Rectal See Alternative 03/08/23 1434)  ondansetron (ZOFRAN) tablet 4 mg ( Oral See Alternative 03/08/23 1435)    Or  ondansetron (ZOFRAN) injection 4 mg (4 mg Intravenous Given 03/08/23 1435)  senna-docusate (Senokot-S) tablet 1 tablet (has no administration in time range)  pantoprazole (PROTONIX) injection 40 mg (40 mg Intravenous Given 03/07/23 2106)  hydrALAZINE (APRESOLINE) injection 5 mg (has no administration in time range)  rOPINIRole (REQUIP) tablet 0.5 mg (0.5 mg Oral Given 03/07/23 1546)  cefTRIAXone (ROCEPHIN) 1 g in sodium chloride 0.9 % 100 mL IVPB (1 g Intravenous New Bag/Given 03/08/23 1440)  oxyCODONE (Oxy IR/ROXICODONE) immediate release tablet 5 mg (5 mg Oral Given 03/08/23 0833)  traMADol (ULTRAM) tablet 50 mg (has no administration in time range)  escitalopram (LEXAPRO) tablet 20 mg (20 mg Oral Given 03/08/23 0833)  LORazepam (ATIVAN) tablet 0.5 mg (0.5 mg Oral Given 03/08/23 1042)  senna-docusate (Senokot-S) tablet 2 tablet (2 tablets Oral Given 03/08/23 0833)  tiZANidine (ZANAFLEX) tablet 2 mg (2 mg Oral Given 03/07/23 1658)  feeding supplement (ENSURE ENLIVE / ENSURE PLUS) liquid 237 mL (237 mLs Oral Patient Refused/Not Given 03/08/23 1440)  0.9 %  sodium chloride infusion (has no administration in time range)  chlorhexidine (HIBICLENS) 4 % liquid 4 Application (has no administration in time range)    And  chlorhexidine (HIBICLENS) 4 % liquid 4 Application (has no administration in time range)  vancomycin (VANCOCIN) IVPB 1000 mg/200 mL premix (has no administration in time range)  ondansetron (ZOFRAN) injection 4 mg (has no administration in time range)  midazolam (VERSED) 2 MG/ML syrup 8 mg (has no administration in time  range)  methylPREDNISolone sodium succinate (SOLU-MEDROL) 125 mg/2 mL injection 125 mg (has no administration in time range)  HYDROmorphone (DILAUDID) injection 1 mg (has no administration in time range)  fentaNYL (SUBLIMAZE) injection 12.5 mcg (has no administration in time range)  famotidine (PEPCID) tablet 40 mg (has no administration in time range)  diphenhydrAMINE (BENADRYL) injection 50 mg (has no administration in time range)  heparin ADULT infusion 100 units/mL (25000 units/274mL) (1,000 Units/hr Intravenous New Bag/Given 03/08/23 1430)  sodium chloride 0.9 % bolus 1,000 mL (0 mLs Intravenous Stopped 03/07/23 1321)  promethazine (PHENERGAN) 6.25 mg in sodium chloride 0.9 % 50 mL IVPB (0 mg Intravenous Stopped 03/07/23 0948)  iohexol (OMNIPAQUE) 300 MG/ML solution 100 mL (100 mLs Intravenous Contrast Given 03/07/23 0918)  potassium chloride 10 mEq in 100 mL IVPB (0 mEq Intravenous Stopped 03/07/23 1219)  apixaban (ELIQUIS)  tablet 10 mg (10 mg Oral Given 03/07/23 1227)  droperidol (INAPSINE) 2.5 MG/ML injection 1.25 mg (1.25 mg Intravenous Given 03/07/23 1241)  iohexol (OMNIPAQUE) 350 MG/ML injection 75 mL (75 mLs Intravenous Contrast Given 03/08/23 1052)  heparin bolus via infusion 2,000 Units (2,000 Units Intravenous Bolus from Bag 03/08/23 1431)   IMPRESSION / MDM / ASSESSMENT AND PLAN / ED COURSE  I reviewed the triage vital signs and the nursing notes.                             The patient is on the cardiac monitor to evaluate for evidence of arrhythmia and/or significant heart rate changes. Patient's presentation is most consistent with acute presentation with potential threat to life or bodily function.   This patient presents to the ED for concern of nausea/vomiting, this involves an extensive number of treatment options, and is a complaint that carries with it a high risk of complications and morbidity.  The differential diagnosis includes small bowel obstruction, gastroenteritis, AAA,  mesenteric ischemia Co morbidities that complicate the patient evaluation  GERD, generalized weakness, severe recurrent diverticulitis, major depressive disorder without psychotic features Additional history obtained:  Additional history obtained from EMS, staff at [facility]  External records from outside source obtained and reviewed including office visit on 02/24/2023 with Dr. Orlie Dakin in oncology Lab Tests:  I Ordered, and personally interpreted labs.  The pertinent results include: Creatinine 1.16, UA with small amount of leukocytes and many bacteria, potassium 3.0 Imaging Studies ordered:  I ordered imaging studies including chest x-ray, CT abdomen pelvis  I independently visualized and interpreted imaging which showed complex recurrent sigmoid diverticulosis without evidence of diverticulitis and incidentally found lower lobe pulmonary embolism  I agree with the radiologist interpretation Cardiac Monitoring: / EKG:  The patient was maintained on a cardiac monitor.  I personally viewed and interpreted the cardiac monitored which showed an underlying rhythm of: Normal sinus rhythm Consultations Obtained:  I requested consultation with the hospitalist service,  and discussed lab and imaging findings as well as pertinent plan - they recommend: Admission Problem List / ED Course / Critical interventions / Medication management  Intractable nausea/vomiting, pulmonary embolism  I ordered medication including apixaban, Phenergan, droperidol for PE and nausea/vomiting  Reevaluation of the patient after these medicines showed that the patient stayed the same  I have reviewed the patients home medicines and have made adjustments as needed  Dispo: Admit to medicine      Clinical Course as of 03/08/23 1548  Sun Mar 07, 2023  1058 Creatinine(!): 1.16 [EB]    Clinical Course User Index [EB] Merwyn Katos, MD   FINAL CLINICAL IMPRESSION(S) / ED DIAGNOSES   Final  diagnoses:  Nausea and vomiting, unspecified vomiting type  Other acute pulmonary embolism, unspecified whether acute cor pulmonale present (HCC)  Intractable nausea   Rx / DC Orders   ED Discharge Orders     None      Note:  This document was prepared using Dragon voice recognition software and may include unintentional dictation errors.   Merwyn Katos, MD 03/08/23 [phone]

## 2023-03-07 NOTE — Assessment & Plan Note (Signed)
Escitalopram 20 mg daily resumed Lorazepam 0.5 mg every 6 hours as needed for anxiety resumed

## 2023-03-07 NOTE — Hospital Course (Addendum)
[name] is a 87 year old female with history of diverticulosis with diverticulitis, hypertension, GERD, generalized weakness, chronic pain low back and bilateral knee pain, chronic osteoarthritis, erythrocytosis, who presents to the emergency department from [facility] for chief concerns of intractable nausea and vomiting, left lower quadrant pain. Also (+)dysuria.  05/05: in ED, VSS, mild hypokalemia, mild AKI, abn UA w/ 6-10 WBC and many bacteria w/ 5 ketones, Procal <0.01. CXR no concerns. CT Abd/Pelv (+)Severe sigmoid diverticulosis, no acute diverticulitis. Mild thickening of the sigmoid colonic wall, likely d/t diverticulosis, no perforation/abscess. (+)thrombus in the incompletely imaged right lower lobe pulmonary vessel concerning for pulmonary embolism. CT chest for further evaluation is suggested. DVT US LE (+)occlusive thrombus in the right femoral, popliteal, posterior tibial and peroneal veins. Given apixiban, potassium, 1L NS, nausea control. Admitted to hospitalist service. Follow BMP and plan CTA if renal fxn appropriate. 05/06: AKI resolved, CTA chest (+)saddle PE w/ RH strain. Vascular consulted - plan thrombectomy tomorrow. Heparin gtt started. Echo ordered.  05/07: Echo EF 60-65, no RWMA, mild LVH, G1DD, RV systolic fxn WNL, tricuspid regurg signal inadequate to assess PA pressure. Underwent thrombectomy of bilateral pulmonary arteries with placement of an Elite IVC filter 05/08-05/15: stable, awaiting SNF placement for STR, insurance auth     Consultants:  Vascular Surgery   Procedures: 05/07: thrombectomy of bilateral pulmonary arteries with placement of an Elite IVC filter      ASSESSMENT & PLAN:   Principal Problem:   Intractable nausea and vomiting Active Problems:   Acute pulmonary embolism (HCC)   HTN (hypertension)   GERD (gastroesophageal reflux disease)   Depression   Generalized weakness   Chronic pain   Erythrocytosis   Hypokalemia   CKD stage  3a, GFR 45-59 ml/min (HCC)   Pyuria   Restless leg   Acute submassive saddle pulmonary embolism (HCC) w/ RV strain  Acute occlusive thrombus in the right femoral, popliteal, posterior tibial and peroneal veins Eliquis  S/p thrombectomy and IVC placement 03/09/2023  Intractable nausea and vomiting Presumed secondary to acute PE Symptomatic support with ondansetron 4 mg p.o./IV as needed for nausea and vomiting; Phenergan 12.5 mg IV every 6 hours as needed for refractory nausea and vomiting, 18 hours of coverage ordered IV fluids as needed   HTN (hypertension) Amlodipine 10 mg daily resumed Hydralazine 5 mg IV every 8 hours as needed for SBP greater 175, 4 days ordered  GERD (gastroesophageal reflux disease) Protonix 40 mg IV nightly, 2 days ordered in setting of intractable nausea and vomiting  CKD stage 3a, GFR 45-59 ml/min (HCC) At baseline, with mild increase in serum creatinine and decrease in EGFR compared to prior, does not meet criteria for acute kidney injury; presumed secondary to prerenal, from GI loss Follow BMP   Pyuria but pt then denies dysuria ESBL likely colonization D/c abx   Restless leg Initiation of Requip 0.5 mg nightly, first dose today, 2 doses ordered Iron panel  Hypokalemia Suspect secondary to GI loss Check magnesium level on admission --> WNL Replace as needed Monitor BMP  Depression Escitalopram 20 mg daily resumed Lorazepam 0.5 mg every 6 hours as needed for anxiety resumed  Chronic pain Home tizanidine 2 mg every 8 hours as needed for muscle spasms resumed Tramadol 50 mg twice daily as needed for moderate pain resumed  Generalized weakness Fall precautions, aspiration, PT, OT, TOC consulted    DVT prophylaxis: n/a, treating w/ Eliquis  Pertinent IV fluids/nutrition: heart healthy diet  Central lines /  invasive devices: none  Code Status: DNR ACP documentation reviewed: 05/15, none on file   Current Admission Status: inpatient    TOC needs / Dispo plan: to SNF/STR Barriers to discharge / significant pending items: pending insurance auth for SNF

## 2023-03-07 NOTE — Assessment & Plan Note (Signed)
Fall precautions, aspiration, PT, OT, TOC consulted

## 2023-03-07 NOTE — Assessment & Plan Note (Addendum)
Suspect secondary to GI loss Check magnesium level on admission Status post potassium chloride 10 mill: IV one-time dose per EDP and potassium chloride 40 mill equivalent p.o. one-time dose per EDP BMP in the AM

## 2023-03-07 NOTE — Assessment & Plan Note (Signed)
Protonix 40 mg IV nightly, 2 days ordered in setting of intractable nausea and vomiting

## 2023-03-08 ENCOUNTER — Inpatient Hospital Stay (HOSPITAL_COMMUNITY)
Admit: 2023-03-08 | Discharge: 2023-03-08 | Disposition: A | Discharge status: Home / Self Care | Payer: Medicare HMO | Attending: Osteopathic Medicine | Admitting: Osteopathic Medicine

## 2023-03-08 ENCOUNTER — Inpatient Hospital Stay: Payer: Medicare HMO

## 2023-03-08 ENCOUNTER — Telehealth (HOSPITAL_COMMUNITY): Payer: Self-pay | Admitting: Pharmacy Technician

## 2023-03-08 ENCOUNTER — Other Ambulatory Visit (HOSPITAL_COMMUNITY): Payer: Self-pay

## 2023-03-08 DIAGNOSIS — I82411 Acute embolism and thrombosis of right femoral vein: Secondary | ICD-10-CM

## 2023-03-08 DIAGNOSIS — I82441 Acute embolism and thrombosis of right tibial vein: Secondary | ICD-10-CM | POA: Diagnosis not present

## 2023-03-08 DIAGNOSIS — I82431 Acute embolism and thrombosis of right popliteal vein: Secondary | ICD-10-CM | POA: Diagnosis not present

## 2023-03-08 DIAGNOSIS — I82451 Acute embolism and thrombosis of right peroneal vein: Secondary | ICD-10-CM

## 2023-03-08 DIAGNOSIS — I2602 Saddle embolus of pulmonary artery with acute cor pulmonale: Secondary | ICD-10-CM

## 2023-03-08 DIAGNOSIS — I2699 Other pulmonary embolism without acute cor pulmonale: Secondary | ICD-10-CM | POA: Diagnosis not present

## 2023-03-08 DIAGNOSIS — Z7901 Long term (current) use of anticoagulants: Secondary | ICD-10-CM

## 2023-03-08 DIAGNOSIS — R112 Nausea with vomiting, unspecified: Secondary | ICD-10-CM | POA: Diagnosis not present

## 2023-03-08 LAB — APTT: aPTT: 123 seconds — ABNORMAL HIGH (ref 24–36)

## 2023-03-08 LAB — IRON AND TIBC
Iron: 68 ug/dL (ref 28–170)
Saturation Ratios: 31 % (ref 10.4–31.8)
TIBC: 218 ug/dL — ABNORMAL LOW (ref 250–450)
UIBC: 150 ug/dL

## 2023-03-08 LAB — CBC
HCT: 40.2 % (ref 36.0–46.0)
Hemoglobin: 13 g/dL (ref 12.0–15.0)
MCH: 28.1 pg (ref 26.0–34.0)
MCHC: 32.3 g/dL (ref 30.0–36.0)
MCV: 87 fL (ref 80.0–100.0)
Platelets: 245 10*3/uL (ref 150–400)
RBC: 4.62 MIL/uL (ref 3.87–5.11)
RDW: 14.6 % (ref 11.5–15.5)
WBC: 10.4 10*3/uL (ref 4.0–10.5)
nRBC: 0 % (ref 0.0–0.2)

## 2023-03-08 LAB — BASIC METABOLIC PANEL
Anion gap: 8 (ref 5–15)
BUN: 5 mg/dL — ABNORMAL LOW (ref 8–23)
CO2: 25 mmol/L (ref 22–32)
Calcium: 8.3 mg/dL — ABNORMAL LOW (ref 8.9–10.3)
Chloride: 106 mmol/L (ref 98–111)
Creatinine, Ser: 0.79 mg/dL (ref 0.44–1.00)
GFR, Estimated: >60 mL/min (ref >60)
Glucose, Bld: 92 mg/dL (ref 70–99)
Potassium: 3.1 mmol/L — ABNORMAL LOW (ref 3.5–5.1)
Sodium: 139 mmol/L (ref 135–145)

## 2023-03-08 LAB — ECHOCARDIOGRAM COMPLETE
AR max vel: 2.48 cm2
AV Area VTI: 2.63 cm2
AV Area mean vel: 2.34 cm2
AV Mean grad: 5 mmHg
AV Peak grad: 9.4 mmHg
Ao pk vel: 1.53 m/s
Area-P 1/2: 3.56 cm2
Height: 63 in
MV VTI: 2.99 cm2
S' Lateral: 2.6 cm
Weight: 2627.88 oz

## 2023-03-08 LAB — FERRITIN: Ferritin: 147 ng/mL (ref 11–307)

## 2023-03-08 LAB — URINE CULTURE

## 2023-03-08 MED ORDER — DIPHENHYDRAMINE HCL 50 MG/ML IJ SOLN: 50.0000 mg | Freq: Once | INTRAMUSCULAR | Status: DC | PRN

## 2023-03-08 MED ORDER — FAMOTIDINE 20 MG PO TABS: 40.0000 mg | ORAL_TABLET | Freq: Once | ORAL | Status: DC | PRN

## 2023-03-08 MED ORDER — HEPARIN (PORCINE) 25000 UT/250ML-% IV SOLN
850.0000 [IU]/h | INTRAVENOUS | Status: DC
  Administered 2023-03-08: 1000 [IU]/h via INTRAVENOUS
  Administered 2023-03-09: 850 [IU]/h via INTRAVENOUS
  Filled 2023-03-08: qty 250

## 2023-03-08 MED ORDER — MIDAZOLAM HCL 2 MG/ML PO SYRP
8.0000 mg | ORAL_SOLUTION | Freq: Once | ORAL | Status: AC | PRN
  Administered 2023-03-09: 8 mg via ORAL

## 2023-03-08 MED ORDER — METHYLPREDNISOLONE SODIUM SUCC 125 MG IJ SOLR: 125.0000 mg | Freq: Once | INTRAMUSCULAR | Status: DC | PRN

## 2023-03-08 MED ORDER — HEPARIN BOLUS VIA INFUSION
2000.0000 [IU] | Freq: Once | INTRAVENOUS | Status: AC
  Administered 2023-03-08: 2000 [IU] via INTRAVENOUS
  Filled 2023-03-08: qty 2000

## 2023-03-08 MED ORDER — ONDANSETRON HCL 4 MG/2ML IJ SOLN
4.0000 mg | Freq: Four times a day (QID) | INTRAMUSCULAR | Status: DC | PRN
  Administered 2023-03-15 – 2023-03-17 (×3): 4 mg via INTRAVENOUS
  Filled 2023-03-08 (×7): qty 2

## 2023-03-08 MED ORDER — CHLORHEXIDINE GLUCONATE 4 % EX SOLN
60.0000 mL | Freq: Once | CUTANEOUS | Status: AC
  Administered 2023-03-09: 4 via TOPICAL

## 2023-03-08 MED ORDER — SODIUM CHLORIDE 0.9 % IV SOLN: INTRAVENOUS | Status: DC

## 2023-03-08 MED ORDER — VANCOMYCIN HCL IN DEXTROSE 1-5 GM/200ML-% IV SOLN
1000.0000 mg | INTRAVENOUS | Status: AC
  Administered 2023-03-09: 1000 mg via INTRAVENOUS
  Filled 2023-03-08: qty 200

## 2023-03-08 MED ORDER — HYDROMORPHONE HCL 1 MG/ML IJ SOLN: 1.0000 mg | Freq: Once | INTRAMUSCULAR | Status: DC | PRN

## 2023-03-08 MED ORDER — FENTANYL CITRATE PF 50 MCG/ML IJ SOSY: 12.5000 ug | PREFILLED_SYRINGE | Freq: Once | INTRAMUSCULAR | Status: DC | PRN

## 2023-03-08 MED ORDER — IOHEXOL 350 MG/ML SOLN
75.0000 mL | Freq: Once | INTRAVENOUS | Status: AC | PRN
  Administered 2023-03-08: 75 mL via INTRAVENOUS

## 2023-03-08 NOTE — Consult Note (Signed)
ANTICOAGULATION CONSULT NOTE - Initial Consult  Pharmacy Consult for: Heparin  Indication: pulmonary embolus and DVT  Allergies  Allergen Reactions   Penicillins Other (See Comments)    Reaction as a child, patient does not recall  Has patient had a PCN reaction causing immediate rash, facial/tongue/throat swelling, SOB or lightheadedness with hypotension: unknown Has patient had a PCN reaction causing severe rash involving mucus membranes or skin necrosis: unknown Has patient had a PCN reaction that required hospitalization: unknown Has patient had a PCN reaction occurring within the last 10 years: No If all of the above answers are "NO", then may proceed with Cephalosporin use.   Sulfa Antibiotics Nausea Only    Patient Measurements: Height: 5\' 3"  (160 cm) Weight: 74.5 kg (164 lb 3.9 oz) IBW/kg (Calculated) : 52.4 Heparin Dosing Weight: 68.2 kg  Vital Signs: Temp: 98 F (36.7 C) (05/06 1143) Temp Source: Oral (05/06 0403) BP: 146/61 (05/06 1143) Pulse Rate: 84 (05/06 1143)  Labs: Recent Labs    03/07/23 0813 03/08/23 0428  HGB 14.9 13.0  HCT 44.6 40.2  PLT 258 245  CREATININE 1.16* 0.79    Estimated Creatinine Clearance: 49.7 mL/min (by C-G formula based on SCr of 0.79 mg/dL).   Medical History: Past Medical History:  Diagnosis Date   Acute diverticulitis 03/07/2018   Anxiety    Arthritis    Depression    Diverticulitis    Dyspnea    Edema    FEET/LEGS   HOH (hard of hearing)    Hypertension    Hypokalemia 02/09/2023    Medications:  Scheduled:   amLODipine  10 mg Oral Daily   escitalopram  20 mg Oral Daily   feeding supplement  237 mL Oral BID BM   heparin  2,000 Units Intravenous Once   pantoprazole (PROTONIX) IV  40 mg Intravenous QHS   rOPINIRole  0.5 mg Oral QHS   rosuvastatin  20 mg Oral QHS   senna-docusate  2 tablet Oral BID   Infusions:   cefTRIAXone (ROCEPHIN)  IV Stopped (03/07/23 1503)   heparin     PRN: acetaminophen **OR**  acetaminophen, hydrALAZINE, LORazepam, ondansetron **OR** ondansetron (ZOFRAN) IV, oxyCODONE, senna-docusate, tiZANidine, traMADol Anti-infectives (From admission, onward)    Start     Dose/Rate Route Frequency Ordered Stop   03/07/23 1500  cefTRIAXone (ROCEPHIN) 1 g in sodium chloride 0.9 % 100 mL IVPB        1 g 200 mL/hr over 30 Minutes Intravenous Every 24 hours 03/07/23 1420 03/12/23 1459       Assessment: 87 yr female with PMH: diverticulosis and HTN presented to the ED with N/V. Pulmonary embolism found via CT Angiography Chest with Contrast. Patient started on apixaban (Eliquis) 10mg  twice daily yesterday (5/5) x 7 days followed by apixaban (Eliquis) 5mg  twice daily for DVT/PE. This has been stopped due to plans for pulmonary thrombectomy tomorrow (5/7). Last dose apixaban was 5/6 08:33. Apixaban may falsely elevate heparin anti-Xa level temporarily and aPTT level will be used to monitor heparin.   Per CT Angiography Chest w/ Contrast:  Acute saddle pulmonary embolism with a moderate clot burden in both central pulmonary arteries extending to the segmental and subsegmental arteries, primarily involving the lower lobes. There is evidence of right heart strain with right to left heart ratio measuring 1.18, consistent with at least submassive (intermediate risk) PE. The presence of right heart strain has been associated with an increased risk of morbidity and mortality.  Goal of Therapy:  aPTT  66-102 seconds Heparin level 0.3-0.7 once heparin level and aPTT is correlating.  Monitor platelets by anticoagulation protocol: Yes   Plan:  Give 2000 units bolus x 1 Start heparin infusion at 1000 units/hr Check aPTT level in 8 hours and daily while on heparin Continue to monitor H&H and platelets Check anti-Xa level tomorrow (5/7) with morning labs/CBC and daily while on heparin Switch to heparin level monitoring once aPTT and heparin level correlate.   Rickey Primus, Pharmacy Candidate,  Class of 2025 03/08/2023,1:30 PM

## 2023-03-08 NOTE — Progress Notes (Signed)
*  PRELIMINARY RESULTS* Echocardiogram 2D Echocardiogram has been performed.  Carolyne Fiscal 03/08/2023, 3:34 PM

## 2023-03-08 NOTE — Consult Note (Signed)
ANTICOAGULATION CONSULT NOTE - Initial Consult  Pharmacy Consult for: Heparin  Indication: pulmonary embolus and DVT  Allergies  Allergen Reactions   Penicillins Other (See Comments)    Reaction as a child, patient does not recall  Has patient had a PCN reaction causing immediate rash, facial/tongue/throat swelling, SOB or lightheadedness with hypotension: unknown Has patient had a PCN reaction causing severe rash involving mucus membranes or skin necrosis: unknown Has patient had a PCN reaction that required hospitalization: unknown Has patient had a PCN reaction occurring within the last 10 years: No If all of the above answers are "NO", then may proceed with Cephalosporin use.   Sulfa Antibiotics Nausea Only    Patient Measurements: Height: 5\' 3"  (160 cm) Weight: 74.5 kg (164 lb 3.9 oz) IBW/kg (Calculated) : 52.4 Heparin Dosing Weight: 68.2 kg  Vital Signs: Temp: 98.2 F (36.8 C) (05/06 2338) Temp Source: Oral (05/06 2338) BP: 166/76 (05/06 2338) Pulse Rate: 19 (05/06 2338)  Labs: Recent Labs    03/07/23 0813 03/08/23 0428 03/08/23 2224  HGB 14.9 13.0  --   HCT 44.6 40.2  --   PLT 258 245  --   APTT  --   --  123*  CREATININE 1.16* 0.79  --      Estimated Creatinine Clearance: 49.7 mL/min (by C-G formula based on SCr of 0.79 mg/dL).   Medical History: Past Medical History:  Diagnosis Date   Acute diverticulitis 03/07/2018   Anxiety    Arthritis    Depression    Diverticulitis    Dyspnea    Edema    FEET/LEGS   HOH (hard of hearing)    Hypertension    Hypokalemia 02/09/2023    Medications:  Scheduled:   amLODipine  10 mg Oral Daily   chlorhexidine  60 mL Topical Once   And   [START ON 03/09/2023] chlorhexidine  60 mL Topical Once   escitalopram  20 mg Oral Daily   feeding supplement  237 mL Oral BID BM   rosuvastatin  20 mg Oral QHS   senna-docusate  2 tablet Oral BID   Infusions:   sodium chloride 75 mL/hr at 03/08/23 2044   cefTRIAXone  (ROCEPHIN)  IV Stopped (03/08/23 1510)   heparin 1,000 Units/hr (03/08/23 1634)   vancomycin     PRN: acetaminophen **OR** acetaminophen, diphenhydrAMINE, famotidine, fentaNYL (SUBLIMAZE) injection, hydrALAZINE, HYDROmorphone (DILAUDID) injection, LORazepam, methylPREDNISolone (SOLU-MEDROL) injection, midazolam, ondansetron **OR** ondansetron (ZOFRAN) IV, ondansetron (ZOFRAN) IV, oxyCODONE, senna-docusate, tiZANidine, traMADol Anti-infectives (From admission, onward)    Start     Dose/Rate Route Frequency Ordered Stop   03/08/23 1531  vancomycin (VANCOCIN) IVPB 1000 mg/200 mL premix        1,000 mg 200 mL/hr over 60 Minutes Intravenous 60 min pre-op 03/08/23 1531     03/07/23 1500  cefTRIAXone (ROCEPHIN) 1 g in sodium chloride 0.9 % 100 mL IVPB        1 g 200 mL/hr over 30 Minutes Intravenous Every 24 hours 03/07/23 1420 03/12/23 1459       Assessment: 87 yr female with PMH: diverticulosis and HTN presented to the ED with N/V. Pulmonary embolism found via CT Angiography Chest with Contrast. Patient started on apixaban (Eliquis) 10mg  twice daily yesterday (5/5) x 7 days followed by apixaban (Eliquis) 5mg  twice daily for DVT/PE. This has been stopped due to plans for pulmonary thrombectomy tomorrow (5/7). Last dose apixaban was 5/6 08:33. Apixaban may falsely elevate heparin anti-Xa level temporarily and aPTT level will be  used to monitor heparin.   Per CT Angiography Chest w/ Contrast:  Acute saddle pulmonary embolism with a moderate clot burden in both central pulmonary arteries extending to the segmental and subsegmental arteries, primarily involving the lower lobes. There is evidence of right heart strain with right to left heart ratio measuring 1.18, consistent with at least submassive (intermediate risk) PE. The presence of right heart strain has been associated with an increased risk of morbidity and mortality.  Goal of Therapy:  aPTT 66-102 seconds Heparin level 0.3-0.7 once heparin  level and aPTT is correlating.  Monitor platelets by anticoagulation protocol: Yes   Plan:  5/6 @ 2224:  aPTT = 123, elevated  - Will decrease heparin infusion to 850 units/hr and recheck aPTT 8 hrs after rate change.  Continue to monitor H&H and platelets Check anti-Xa level tomorrow (5/7) with morning labs/CBC and daily while on heparin Switch to heparin level monitoring once aPTT and heparin level correlate.   Donaldson Richter D, Pharmacy Candidate, Class of 2025 03/08/2023,11:44 PM

## 2023-03-08 NOTE — Progress Notes (Signed)
PT Cancellation Note  Patient Details Name: Audreigh Bellan Sarra MRN: [id number] DOB: 16-Feb-1936   Cancelled Treatment:    Reason Eval/Treat Not Completed: Patient not medically ready Orders received, chart reviewed. Currently waiting for CTA to rule out PE. Will hold until medically appropriate.   Maylon Peppers, PT, DPT Physical Therapist - [place]  [facility]    Antoni Stefan A Dewel Lotter 03/08/2023, 9:11 AM

## 2023-03-08 NOTE — Consult Note (Signed)
Hospital Consult    Reason for Consult:  Rt Lower extremity DVT with Possible PE Requesting Physician:  Dr Londell Moh DO MRN #:  [id number]  History of Present Illness: This is a 87 y.o. female with history of diverticulosis with diverticulitis, hypertension, GERD, generalized weakness, chronic pain low back and bilateral knee pain, chronic osteoarthritis, erythrocytosis, who presents to the emergency department [facility] for chief concerns of intractable nausea and vomiting, left lower quadrant pain.  Upon workup patient underwent bilateral lower extremity venous Doppler ultrasounds.  She is noted to have a DVT from the femoral vein down to her peroneal vein and posterior tibial vein.  Patient also underwent CT angiograph of the chest with contrast.  Is noted to have an acute saddle pulmonary embolism with moderate clot burden in both central pulmonary arteries extending to the subsegmental arteries primarily involving her lower lobes.  There is evidence of right heart strain measuring at 1.18.  On exam this morning patient was resting comfortably in bed.  She was not on any nasal cannula oxygen at the time.  There was normal respiratory effort noted.  Patient denies any shortness of breath.  Bilateral lower extremities were the same size.  No edema noted in her right leg greater than her left.  Denies any resting pain today.  Does endorse pain to her right leg upon moving such as ambulating to the bathroom or standing.  Otherwise she has no other complaints.  Her vitals all remained stable.  Patient was started on Eliquis prior to obtaining CT angiograph of her chest.  Past Medical History:  Diagnosis Date   Acute diverticulitis 03/07/2018   Anxiety    Arthritis    Depression    Diverticulitis    Dyspnea    Edema    FEET/LEGS   HOH (hard of hearing)    Hypertension    Hypokalemia 02/09/2023    Past Surgical History:  Procedure Laterality Date   ABDOMINAL HYSTERECTOMY      APPENDECTOMY     BACK SURGERY     CATARACT EXTRACTION W/PHACO Right 08/30/2018   Procedure: CATARACT EXTRACTION PHACO AND INTRAOCULAR LENS PLACEMENT (IOC);  Surgeon: Galen Manila, MD;  Location: [facility] ORS;  Service: Ophthalmology;  Laterality: Right;  [place] 01:07.1 CDE 13.66 Fluid Pack Lot # [id number]   CATARACT EXTRACTION W/PHACO Left 09/20/2018   Procedure: CATARACT EXTRACTION PHACO AND INTRAOCULAR LENS PLACEMENT (IOC);  Surgeon: Galen Manila, MD;  Location: [facility] ORS;  Service: Ophthalmology;  Laterality: Left;  [place] 01:09 CDE 11.45 fluid pack lot # [id number]   CHOLECYSTECTOMY     HYSTERECTOMY ABDOMINAL WITH SALPINGECTOMY      Allergies  Allergen Reactions   Penicillins Other (See Comments)    Reaction as a child, patient does not recall  Has patient had a PCN reaction causing immediate rash, facial/tongue/throat swelling, SOB or lightheadedness with hypotension: unknown Has patient had a PCN reaction causing severe rash involving mucus membranes or skin necrosis: unknown Has patient had a PCN reaction that required hospitalization: unknown Has patient had a PCN reaction occurring within the last 10 years: No If all of the above answers are "NO", then may proceed with Cephalosporin use.   Sulfa Antibiotics Nausea Only    Prior to Admission medications   Medication Sig Start Date End Date Taking? Authorizing Provider  acetaminophen (TYLENOL) 500 MG tablet Take 1,000 mg by mouth every 6 (six) hours as needed for mild pain or moderate pain.    Yes [provider]  amLODipine (NORVASC) 10 MG tablet Take 1 tablet (10 mg total) by mouth daily. 02/19/23  Yes Esaw Grandchild A, DO  diphenhydrAMINE (BENADRYL) 50 MG tablet Take 1 tablet (50 mg total) by mouth at bedtime as needed for itching. 09/21/20  Yes Esaw Grandchild A, DO  escitalopram (LEXAPRO) 20 MG tablet Take 1 tablet (20 mg total) by mouth daily. 03/14/18  Yes Katha Hamming, MD  fluticasone (FLONASE) 50 MCG/ACT  nasal spray Place 2 sprays into both nostrils 2 (two) times daily as needed for allergies or rhinitis.    Yes [provider]  ibuprofen (ADVIL) 200 MG tablet Take 2 tablets (400 mg total) by mouth every 6 (six) hours as needed for headache, mild pain or fever. 02/18/23  Yes Esaw Grandchild A, DO  LORazepam (ATIVAN) 0.5 MG tablet Take 1 tablet (0.5 mg total) by mouth 2 (two) times daily. Patient taking differently: Take 0.5 mg by mouth in the morning. 02/18/23  Yes Esaw Grandchild A, DO  meloxicam (MOBIC) 15 MG tablet Take 15 mg by mouth daily. 12/30/22  Yes [provider]  methocarbamol (ROBAXIN) 500 MG tablet Take 1 tablet (500 mg total) by mouth every 6 (six) hours as needed for muscle spasms. 09/21/20  Yes Pennie Banter, DO  Multiple Vitamin (MULTIVITAMIN WITH MINERALS) TABS tablet Take 1 tablet by mouth daily. 09/22/20  Yes Pennie Banter, DO  naloxone [facility]) nasal spray 4 mg/0.1 mL Place 1 spray into the nose as needed (suspected opioid overdose).   Yes [provider]  ondansetron (ZOFRAN-ODT) 8 MG disintegrating tablet Take 1 tablet (8 mg total) by mouth every 8 (eight) hours as needed for nausea or vomiting. 02/18/23  Yes Esaw Grandchild A, DO  oxyCODONE (OXY IR/ROXICODONE) 5 MG immediate release tablet Take 1 tablet (5 mg total) by mouth every 6 (six) hours as needed for breakthrough pain (pain despite tramadol). 02/18/23  Yes Esaw Grandchild A, DO  pantoprazole (PROTONIX) 40 MG tablet Take 1 tablet (40 mg total) by mouth 2 (two) times daily. 09/21/20  Yes Esaw Grandchild A, DO  polyethylene glycol (MIRALAX / GLYCOLAX) 17 g packet Take 17 g by mouth once.   Yes [provider]  rosuvastatin (CRESTOR) 20 MG tablet Take 20 mg by mouth at bedtime.   Yes [provider]  senna-docusate (SENOKOT-S) 8.6-50 MG tablet Take 2 tablets by mouth 2 (two) times daily as needed for mild constipation. Patient taking differently: Take 2 tablets by mouth 2  (two) times daily. 03/08/20  Yes Marrion Coy, MD  tiZANidine (ZANAFLEX) 2 MG tablet Take 2 mg by mouth 3 (three) times daily.   Yes [provider]  traMADol (ULTRAM) 50 MG tablet Take 1 tablet (50 mg total) by mouth 2 (two) times daily. Patient taking differently: Take 50 mg by mouth 2 (two) times daily as needed for moderate pain. 02/18/23  Yes Esaw Grandchild A, DO  triamcinolone (KENALOG) 0.025 % cream Apply 1 Application topically 2 (two) times daily.   Yes [provider]  feeding supplement (ENSURE ENLIVE / ENSURE PLUS) LIQD Take 237 mLs by mouth 2 (two) times daily between meals. 09/22/20   Pennie Banter, DO    Social History   Socioeconomic History   Marital status: Single    Spouse name: Not on file   Number of children: Not on file   Years of education: Not on file   Highest education level: Not on file  Occupational History   Occupation: retired  Tobacco Use   Smoking status: Never   Smokeless tobacco: Never  Vaping Use   Vaping Use: Never used  Substance and Sexual Activity   Alcohol use: No   Drug use: No   Sexual activity: Not Currently  Other Topics Concern   Not on file  Social History Narrative   Not on file   Social Determinants of Health   Financial Resource Strain: Not on file  Food Insecurity: No Food Insecurity (03/07/2023)   Hunger Vital Sign    Worried About Running Out of Food in the Last Year: Never true    Ran Out of Food in the Last Year: Never true  Transportation Needs: No Transportation Needs (03/07/2023)   PRAPARE - Administrator, Civil Service (Medical): No    Lack of Transportation (Non-Medical): No  Physical Activity: Not on file  Stress: Not on file  Social Connections: Not on file  Intimate Partner Violence: Not At Risk (03/07/2023)   Humiliation, Afraid, Rape, and Kick questionnaire    Fear of Current or Ex-Partner: No    Emotionally Abused: No    Physically Abused: No    Sexually Abused: No      Family History  Problem Relation Age of Onset   Hypertension Father    CAD Brother     ROS: Otherwise negative unless mentioned in HPI  Physical Examination  Vitals:   03/08/23 0403 03/08/23 0807  BP: (!) 147/70 (!) 178/67  Pulse: 66 83  Resp: 18 18  Temp: 97.7 F (36.5 C) 98.5 F (36.9 C)  SpO2: 93% 93%   Body mass index is 29.09 kg/m.  General:  WDWN in NAD Gait: Not observed HENT: WNL, normocephalic Pulmonary: normal non-labored breathing, without Rales, rhonchi,  wheezing Cardiac: regular, without  Murmurs, rubs or gallops; without carotid bruits Abdomen: Positive bowel sounds, soft, NT/ND, no masses Skin: without rashes Vascular Exam/Pulses: Positive palpable pulses throughout  Extremities: without ischemic changes, without Gangrene , with cellulitis; without open wounds;  Musculoskeletal: no muscle wasting or atrophy  Neurologic: A&O X 3;  No focal weakness or paresthesias are detected; speech is fluent/normal Psychiatric:  The pt has Normal affect. Lymph:  Unremarkable  CBC    Component Value Date/Time   WBC 10.4 03/08/2023 0428   RBC 4.62 03/08/2023 0428   HGB 13.0 03/08/2023 0428   HGB 14.6 02/24/2023 1147   HGB 13.2 03/28/2013 0240   HCT 40.2 03/08/2023 0428   HCT 38.8 03/28/2013 0240   PLT 245 03/08/2023 0428   PLT 321 02/24/2023 1147   PLT 269 03/28/2013 0240   MCV 87.0 03/08/2023 0428   MCV 85 03/28/2013 0240   MCH 28.1 03/08/2023 0428   MCHC 32.3 03/08/2023 0428   RDW 14.6 03/08/2023 0428   RDW 13.3 03/28/2013 0240   LYMPHSABS 1.0 03/07/2023 0813   LYMPHSABS 1.1 03/28/2013 0240   MONOABS 0.5 03/07/2023 0813   MONOABS 0.8 03/28/2013 0240   EOSABS 0.2 03/07/2023 0813   EOSABS 0.0 03/28/2013 0240   BASOSABS 0.1 03/07/2023 0813   BASOSABS 0.0 03/28/2013 0240    BMET    Component Value Date/Time   NA 139 03/08/2023 0428   NA 143 03/31/2013 0438   K 3.1 (L) 03/08/2023 0428   K 3.7 03/31/2013 0438   CL 106 03/08/2023 0428   CL  109 (H) 03/31/2013 0438   CO2 25 03/08/2023 0428   CO2 28 03/31/2013 0438   GLUCOSE 92 03/08/2023 0428   GLUCOSE 109 (  H) 03/31/2013 0438   BUN 5 (L) 03/08/2023 0428   BUN 4 (L) 03/31/2013 0438   CREATININE 0.79 03/08/2023 0428   CREATININE 1.04 (H) 02/24/2023 1148   CREATININE 0.95 03/31/2013 0438   CALCIUM 8.3 (L) 03/08/2023 0428   CALCIUM 8.0 (L) 03/31/2013 0438   GFRNONAA >60 03/08/2023 0428   GFRNONAA 53 (L) 02/24/2023 1148   GFRNONAA 59 (L) 03/31/2013 0438   GFRAA 52 (L) 03/07/2020 0457   GFRAA >60 03/31/2013 0438    COAGS: Lab Results  Component Value Date   INR 1.1 02/09/2023     Non-Invasive Vascular Imaging:   EXAM: BILATERAL LOWER EXTREMITY VENOUS DOPPLER ULTRASOUND    COMPARISON:  None Available.   FINDINGS: RIGHT LOWER EXTREMITY   Common Femoral Vein: No evidence of thrombus. Normal compressibility, respiratory phasicity and response to augmentation.   Saphenofemoral Junction: No evidence of thrombus. Normal compressibility and flow on color Doppler imaging.   Profunda Femoral Vein: No evidence of thrombus. Normal compressibility and flow on color Doppler imaging.   Femoral Vein: Occlusive thrombus.   Popliteal Vein: Occlusive thrombus.   Calf Veins: Occlusive thrombus in the posterior tibial and peroneal veins.   Superficial Great Saphenous Vein: No evidence of thrombus. Normal compressibility.   Venous Reflux:  None.   Other Findings:  None.   LEFT LOWER EXTREMITY   Common Femoral Vein: No evidence of thrombus. Normal compressibility, respiratory phasicity and response to augmentation.   Saphenofemoral Junction: No evidence of thrombus. Normal compressibility and flow on color Doppler imaging.   Profunda Femoral Vein: No evidence of thrombus. Normal compressibility and flow on color Doppler imaging.   Femoral Vein: No evidence of thrombus. Normal compressibility, respiratory phasicity and response to augmentation.   Popliteal Vein:  No evidence of thrombus. Normal compressibility, respiratory phasicity and response to augmentation.   Calf Veins: No evidence of thrombus. Normal compressibility and flow on color Doppler imaging.   Superficial Great Saphenous Vein: No evidence of thrombus. Normal compressibility.   Venous Reflux:  None.   Other Findings:  None.   IMPRESSION: 1. Occlusive thrombus in the right femoral, popliteal, posterior tibial and peroneal veins. 2. No evidence of deep venous thrombosis in the left lower extremity.  EXAM: CT ABDOMEN AND PELVIS WITH CONTRAST     RADIATION DOSE REDUCTION: This exam was performed according to the departmental dose-optimization program which includes automated exposure control, adjustment of the mA and/or kV according to patient size and/or use of iterative reconstruction technique.   CONTRAST:  OMNIPAQUE IOHEXOL 300 MG/ML  SOLN   COMPARISON:  None Available.   FINDINGS: Lower chest: There is a thrombus in the incompletely imaged right lower lobe pulmonary vessel concerning for pulmonary embolism.   Hepatobiliary: No focal liver abnormality is seen. Status post cholecystectomy. Mild intrahepatic and prominent extrahepatic biliary duct, likely secondary to cholecystectomy.   Pancreas: Unremarkable. No pancreatic ductal dilatation or surrounding inflammatory changes.   Spleen: Normal in size without focal abnormality.   Adrenals/Urinary Tract: Adrenal glands are unremarkable. Kidneys are normal, without renal calculi, focal lesion, or hydronephrosis. Bladder is unremarkable.   Stomach/Bowel: Stomach is within normal limits. Appendix not identified. Colonic diverticulosis prominent in the sigmoid colon without evidence of acute diverticulitis. Mild thickening of the sigmoid colonic wall, likely secondary to severe diverticulosis. No abdominal collection or abscess. No extraluminal free air.   Vascular/Lymphatic: Aortic atherosclerosis. No  enlarged abdominal or pelvic lymph nodes.   Reproductive: Status post hysterectomy. No adnexal masses.  Other: No abdominal wall hernia or abnormality. No abdominopelvic ascites.   Musculoskeletal: Multilevel degenerate disc disease of the lumbar spine. No acute osseous abnormality.   IMPRESSION: 1. Severe sigmoid diverticulosis without evidence of acute diverticulitis. Mild thickening of the sigmoid colonic wall, likely secondary to severe diverticulosis. No evidence of perforation or abscess. 2. There is a thrombus in the incompletely imaged right lower lobe pulmonary vessel concerning for pulmonary embolism. CT chest for further evaluation is suggested. 3. Status post cholecystectomy with mild intrahepatic and prominent extrahepatic biliary duct, likely secondary to cholecystectomy. 4. Aortic atherosclerosis. 5. Multilevel degenerate disc disease of the lumbar spine.  EXAM: CT ANGIOGRAPHY CHEST WITH CONTRAST   TECHNIQUE: Multidetector CT imaging of the chest was performed using the standard protocol during bolus administration of intravenous contrast. Multiplanar CT image reconstructions and MIPs were obtained to evaluate the vascular anatomy.   RADIATION DOSE REDUCTION: This exam was performed according to the departmental dose-optimization program which includes automated exposure control, adjustment of the mA and/or kV according to patient size and/or use of iterative reconstruction technique.   CONTRAST:  75mL OMNIPAQUE IOHEXOL 350 MG/ML SOLN   COMPARISON:  Chest radiograph 1 day prior, CT abdomen/pelvis 1 day prior   FINDINGS: Cardiovascular: There is acute saddle pulmonary embolism straddling the main pulmonary artery bifurcation with a moderate clot burden in both central pulmonary arteries extending to the segmental and sub segmental arteries, primarily involving the lower lobes. The clot on the left is primarily nonocclusive.   The heart is enlarged.  There is no pericardial effusion. There is mild calcified plaque in the nonaneurysmal thoracic aorta. There is evidence of right heart strain with right to left heart ratio measuring 1.18.   Mediastinum/Nodes: The thyroid is unremarkable. The esophagus is grossly unremarkable. There is no mediastinal, hilar, or axillary lymphadenopathy.   Lungs/Pleura: The trachea and central airways are patent.   Lung volumes are low. Opacities in the lung bases likely reflect dependent subsegmental atelectasis. There is mild interlobular septal thickening in the lung apices. There is no focal airspace consolidation. There is no pleural effusion. There is no pneumothorax.   There are no suspicious nodules.   Upper Abdomen: The imaged portions of the upper abdominal viscera are unremarkable.   Musculoskeletal: There is no acute osseous abnormality or suspicious osseous lesion.   Review of the MIP images confirms the above findings.   IMPRESSION: 1. Acute saddle pulmonary embolism with a moderate clot burden in both central pulmonary arteries extending to the segmental and sub segmental arteries, primarily involving the lower lobes. There is evidence of right heart strain with right to left heart ratio measuring 1.18, consistent with at least submassive (intermediate risk) PE. The presence of right heart strain has been associated with an increased risk of morbidity and mortality. Please refer to the "Code PE Focused" order set in EPIC. 2. Cardiomegaly with mild interlobular septal thickening in the lung apices suggesting mild interstitial edema.  Statin:  Yes.   Beta Blocker:  No. Aspirin:  No. ACEI:  No. ARB:  Yes.   CCB use:  No Other antiplatelets/anticoagulants:  Yes.   Eliquis 10 mg BID   ASSESSMENT/PLAN: This is a 87 y.o. female who presents to [facility] emergency department [facility] for chief concerns of intractable nausea and vomiting with left lower quadrant pain.  On  further workup patient was found to have a pulmonary Embolism and a right lower extremity DVT.  PLAN: Vascular surgery plans on taking the patient  to the vascular lab tomorrow 03/09/2023 for a pulmonary thrombectomy.  Patient was started on Eliquis 10 mg twice daily to be converted to 5 mg twice daily in 7 days for her DVT.  That has been stopped today and a heparin infusion has been started.  Patient will be made n.p.o. after midnight for procedure tomorrow.  Discussed in detail with the patient the procedure, benefits, complications, risks.  Patient verbalizes her understanding.  Patient is hard of hearing but she did verbalize her understanding of my explanation of the procedure.  I answered all the patient's questions.  Will be made n.p.o. after midnight.  I discussed the plan in detail with Dr. Levora Dredge MD and he is in agreement with the plan.   Marcie Bal [facility] 03/08/2023 9:38 AM

## 2023-03-08 NOTE — Telephone Encounter (Signed)
Pharmacy Patient Advocate Encounter  Insurance verification completed.    The patient is insured through [employer] Part D   The patient is currently admitted and ran test claims for the following: Eliquis .  Copays and coinsurance results were relayed to Inpatient clinical team.      

## 2023-03-08 NOTE — Progress Notes (Signed)
OT Cancellation Note  Patient Details Name: Marilyn Andrews MRN: [id number] DOB: [birth year]   Cancelled Treatment:    Reason Eval/Treat Not Completed: Patient not medically ready. OT orders received, chart reviewed. CTA currently ordered to rule out PE. Will re-attempt OT evaluation as pt medically appropriate.  Dorene Grebe  [facility] 03/08/2023, 10:19 AM

## 2023-03-08 NOTE — TOC Benefit Eligibility Note (Signed)
Patient Advocate Encounter  Insurance verification completed.    The patient is currently admitted and upon discharge could be taking Eliquis 5 mg.  The current 30 day co-pay is $11.20.   The patient is insured through [employer] Medicare Part D   This test claim was processed through [facility]- copay amounts may vary at other pharmacies due to pharmacy/plan contracts, or as the patient moves through the different stages of their insurance plan.  Richardo Popoff, CPHT Pharmacy Patient Advocate Specialist [facility] Patient Advocate Team Direct Number: [phone]  Fax: [phone]       

## 2023-03-08 NOTE — Progress Notes (Signed)
PROGRESS NOTE    Marilyn Andrews   NWG:[id number] DOB: 1936-04-21  DOA: 03/07/2023 Date of Service: 03/08/23 PCP: [facility]     Brief Narrative / Hospital Course:  [name] is a 87 year old female with history of diverticulosis with diverticulitis, hypertension, GERD, generalized weakness, chronic pain low back and bilateral knee pain, chronic osteoarthritis, erythrocytosis, who presents [facility] the emergency [facility] for chief concerns of intractable nausea and vomiting, left lower quadrant pain. Also (+)dysuria.  05/05: in ED, VSS, mild hypokalemia, mild AKI, abn UA w/ 6-10 WBC and many bacteria w/ 5 ketones, Procal <0.01. CXR no concerns. CT Abd/Pelv (+)Severe sigmoid diverticulosis, no acute diverticulitis. Mild thickening of the sigmoid colonic wall, likely d/t diverticulosis, no perforation/abscess. (+)thrombus in the incompletely imaged right lower lobe pulmonary vessel concerning for pulmonary embolism. CT chest for further evaluation is suggested. DVT US LE (+)occlusive thrombus in the right femoral, popliteal, posterior tibial and peroneal veins. Given apixiban, potassium, 1L NS, nausea control. Admitted to hospitalist service. Follow BMP and plan CTA if renal fxn appropriate. 05/06: AKI resolved, CTA chest (+)saddle PE w/ RH strain. Vascular consulted - plan thrombectomy tomorrow. Heparin gtt started. Echo ordered.     Consultants:  Vascular Surgery   Procedures: none      ASSESSMENT & PLAN:   Principal Problem:   Intractable nausea and vomiting Active Problems:   Acute pulmonary embolism (HCC)   HTN (hypertension)   GERD (gastroesophageal reflux disease)   Depression   Generalized weakness   Chronic pain   Erythrocytosis   Hypokalemia   CKD stage 3a, GFR 45-59 ml/min (HCC)   Pyuria   Restless leg   Acute submassive saddle pulmonary embolism (HCC) w/ RV strain  Acute occlusive thrombus in the right femoral, popliteal,  posterior tibial and peroneal veins Eliquis switched to heparin gtt Vascular planning thrombectomy tomorrow Echo ordered   Intractable nausea and vomiting Presumed secondary to acute PE Symptomatic support with ondansetron 4 mg p.o./IV as needed for nausea and vomiting; Phenergan 12.5 mg IV every 6 hours as needed for refractory nausea and vomiting, 18 hours of coverage ordered LR 150 mL/h, 1 day ordered --> d/c Protonix 40 mg IV nightly, 2 doses ordered  HTN (hypertension) Amlodipine 10 mg daily resumed Hydralazine 5 mg IV every 8 hours as needed for SBP greater 175, 4 days ordered  GERD (gastroesophageal reflux disease) Protonix 40 mg IV nightly, 2 days ordered in setting of intractable nausea and vomiting  CKD stage 3a, GFR 45-59 ml/min (HCC) At baseline, with mild increase in serum creatinine and decrease in EGFR compared to prior, does not meet criteria for acute kidney injury; presumed secondary to prerenal, from GI loss Follow BMP   Pyuria (+)dysuria - will treat as UTI present on admission Ceftriaxone 1 g IV daily, 5 doses ordered UCx pending   Restless leg Initiation of Requip 0.5 mg nightly, first dose today, 2 doses ordered Iron panel  Hypokalemia Suspect secondary to GI loss Check magnesium level on admission --> WNL Replace as needed Monitor BMP  Depression Escitalopram 20 mg daily resumed Lorazepam 0.5 mg every 6 hours as needed for anxiety resumed  Chronic pain Home tizanidine 2 mg every 8 hours as needed for muscle spasms resumed Tramadol 50 mg twice daily as needed for moderate pain resumed  Generalized weakness Fall precautions, aspiration, PT, OT, TOC consulted    DVT prophylaxis: n/a, treating w/ Eliquis  Pertinent IV fluids/nutrition: stopped IV fluids, NPO  at [facility] for thrombectomy tomorrow  Central lines / invasive devices: none  Code Status: DNR ACP documentation reviewed: 05/06, none on file   Current Admission Status: inpatient   TOC  needs / Dispo plan: pending PT/OT Barriers to discharge / significant pending items: thrombectomy tomorrow, expect here at least couple more days            Subjective / Brief ROS:  Patient reports anxiety Denies CP/SOB.  Pain controlled.  Denies new weakness.  Tolerating diet.  Reports no concerns w/ urination/defecation.   Family Communication: spoke on phone w/ Dionne Ano Marina 03/08/23 1:19 PM     Objective Findings:  Vitals:   03/07/23 2335 03/08/23 0403 03/08/23 0807 03/08/23 1143  BP: (!) 157/73 (!) 147/70 (!) 178/67 (!) 146/61  Pulse: 99 66 83 84  Resp: 16 18 18 20   Temp: 98.1 F (36.7 C) 97.7 F (36.5 C) 98.5 F (36.9 C) 98 F (36.7 C)  TempSrc: Oral Oral    SpO2: 95% 93% 93% 91%  Weight:      Height:        Intake/Output Summary (Last 24 hours) at 03/08/2023 1314 Last data filed at 03/08/2023 1018 Gross per 24 hour  Intake 552.29 ml  Output 1450 ml  Net -897.71 ml   Filed Weights   03/07/23 1745  Weight: 74.5 kg    Examination:  She is VERY hard of hearing  Physical Exam Constitutional:      General: She is not in acute distress. Cardiovascular:     Rate and Rhythm: Normal rate and regular rhythm.  Pulmonary:     Effort: Pulmonary effort is normal. No respiratory distress.     Breath sounds: Normal breath sounds.  Abdominal:     General: Abdomen is flat.     Palpations: Abdomen is soft.  Skin:    General: Skin is warm and dry.  Neurological:     Mental Status: She is alert and oriented to person, place, and time. Mental status is at baseline.          Scheduled Medications:   amLODipine  10 mg Oral Daily   escitalopram  20 mg Oral Daily   feeding supplement  237 mL Oral BID BM   pantoprazole (PROTONIX) IV  40 mg Intravenous QHS   rOPINIRole  0.5 mg Oral QHS   rosuvastatin  20 mg Oral QHS   senna-docusate  2 tablet Oral BID    Continuous Infusions:  cefTRIAXone (ROCEPHIN)  IV Stopped (03/07/23 1503)    PRN Medications:   acetaminophen **OR** acetaminophen, hydrALAZINE, LORazepam, ondansetron **OR** ondansetron (ZOFRAN) IV, oxyCODONE, senna-docusate, tiZANidine, traMADol  Antimicrobials from admission:  Anti-infectives (From admission, onward)    Start     Dose/Rate Route Frequency Ordered Stop   03/07/23 1500  cefTRIAXone (ROCEPHIN) 1 g in sodium chloride 0.9 % 100 mL IVPB        1 g 200 mL/hr over 30 Minutes Intravenous Every 24 hours 03/07/23 1420 03/12/23 1459           Data Reviewed:  I have personally reviewed the following...  CBC: Recent Labs  Lab 03/07/23 0813 03/08/23 0428  WBC 9.5 10.4  NEUTROABS 7.7  --   HGB 14.9 13.0  HCT 44.6 40.2  MCV 85.6 87.0  PLT 258 245   Basic Metabolic Panel: Recent Labs  Lab 03/07/23 0813 03/08/23 0428  NA 140 139  K 3.0* 3.1*  CL 107 106  CO2 23 25  GLUCOSE  100* 92  BUN 8 5*  CREATININE 1.16* 0.79  CALCIUM 8.8* 8.3*  MG 2.1  --    GFR: Estimated Creatinine Clearance: 49.7 mL/min (by C-G formula based on SCr of 0.79 mg/dL). Liver Function Tests: Recent Labs  Lab 03/07/23 0813  AST 26  ALT 17  ALKPHOS 80  BILITOT 1.1  PROT 6.6  ALBUMIN 3.3*   Recent Labs  Lab 03/07/23 0813  LIPASE 26   No results for input(s): "AMMONIA" in the last 168 hours. Coagulation Profile: No results for input(s): "INR", "PROTIME" in the last 168 hours. Cardiac Enzymes: No results for input(s): "CKTOTAL", "CKMB", "CKMBINDEX", "TROPONINI" in the last 168 hours. BNP (last 3 results) No results for input(s): "PROBNP" in the last 8760 hours. HbA1C: No results for input(s): "HGBA1C" in the last 72 hours. CBG: No results for input(s): "GLUCAP" in the last 168 hours. Lipid Profile: No results for input(s): "CHOL", "HDL", "LDLCALC", "TRIG", "CHOLHDL", "LDLDIRECT" in the last 72 hours. Thyroid Function Tests: No results for input(s): "TSH", "T4TOTAL", "FREET4", "T3FREE", "THYROIDAB" in the last 72 hours. Anemia Panel: Recent Labs    03/08/23 0426   FERRITIN 147  TIBC 218*  IRON 68   Most Recent Urinalysis On File:     Component Value Date/Time   COLORURINE AMBER (A) 03/07/2023 1245   APPEARANCEUR HAZY (A) 03/07/2023 1245   APPEARANCEUR Hazy 03/27/2013 1114   LABSPEC 1.035 (H) 03/07/2023 1245   LABSPEC 1.026 03/27/2013 1114   PHURINE 8.0 03/07/2023 1245   GLUCOSEU NEGATIVE 03/07/2023 1245   GLUCOSEU Negative 03/27/2013 1114   HGBUR NEGATIVE 03/07/2023 1245   BILIRUBINUR NEGATIVE 03/07/2023 1245   BILIRUBINUR Negative 03/27/2013 1114   KETONESUR 5 (A) 03/07/2023 1245   PROTEINUR NEGATIVE 03/07/2023 1245   NITRITE NEGATIVE 03/07/2023 1245   LEUKOCYTESUR SMALL (A) 03/07/2023 1245   LEUKOCYTESUR Negative 03/27/2013 1114   Sepsis Labs: @LABRCNTIP (procalcitonin:4,lacticidven:4) Microbiology: No results found for this or any previous visit (from the past 240 hour(s)).    Radiology Studies last 3 days: CT Angio Chest Pulmonary Embolism (PE) W or WO Contrast  Result Date: 03/08/2023 CLINICAL DATA:  Presents with nausea, vomiting, left lower quadrant pain. EXAM: CT ANGIOGRAPHY CHEST WITH CONTRAST TECHNIQUE: Multidetector CT imaging of the chest was performed using the standard protocol during bolus administration of intravenous contrast. Multiplanar CT image reconstructions and MIPs were obtained to evaluate the vascular anatomy. RADIATION DOSE REDUCTION: This exam was performed according to the departmental dose-optimization program which includes automated exposure control, adjustment of the mA and/or kV according to patient size and/or use of iterative reconstruction technique. CONTRAST:  75mL OMNIPAQUE IOHEXOL 350 MG/ML SOLN COMPARISON:  Chest radiograph 1 day prior, CT abdomen/pelvis 1 day prior FINDINGS: Cardiovascular: There is acute saddle pulmonary embolism straddling the main pulmonary artery bifurcation with a moderate clot burden in both central pulmonary arteries extending to the segmental and sub segmental arteries,  primarily involving the lower lobes. The clot on the left is primarily nonocclusive. The heart is enlarged. There is no pericardial effusion. There is mild calcified plaque in the nonaneurysmal thoracic aorta. There is evidence of right heart strain with right to left heart ratio measuring 1.18. Mediastinum/Nodes: The thyroid is unremarkable. The esophagus is grossly unremarkable. There is no mediastinal, hilar, or axillary lymphadenopathy. Lungs/Pleura: The trachea and central airways are patent. Lung volumes are low. Opacities in the lung bases likely reflect dependent subsegmental atelectasis. There is mild interlobular septal thickening in the lung apices. There is no focal airspace consolidation.  There is no pleural effusion. There is no pneumothorax. There are no suspicious nodules. Upper Abdomen: The imaged portions of the upper abdominal viscera are unremarkable. Musculoskeletal: There is no acute osseous abnormality or suspicious osseous lesion. Review of the MIP images confirms the above findings. IMPRESSION: 1. Acute saddle pulmonary embolism with a moderate clot burden in both central pulmonary arteries extending to the segmental and sub segmental arteries, primarily involving the lower lobes. There is evidence of right heart strain with right to left heart ratio measuring 1.18, consistent with at least submassive (intermediate risk) PE. The presence of right heart strain has been associated with an increased risk of morbidity and mortality. Please refer to the "Code PE Focused" order set in EPIC. 2. Cardiomegaly with mild interlobular septal thickening in the lung apices suggesting mild interstitial edema. Critical Value/emergent results were called by telephone at the time of interpretation on 03/08/2023 at 12:42 pm to provider Sunnie Nielsen , who verbally acknowledged these results. Electronically Signed   By: Lesia Hausen M.D.   On: 03/08/2023 12:42   US Venous Img Lower Bilateral (DVT)  Result  Date: 03/07/2023 CLINICAL DATA:  Pulmonary embolus. EXAM: BILATERAL LOWER EXTREMITY VENOUS DOPPLER ULTRASOUND TECHNIQUE: Gray-scale sonography with graded compression, as well as color Doppler and duplex ultrasound were performed to evaluate the lower extremity deep venous systems from the level of the common femoral vein and including the common femoral, femoral, profunda femoral, popliteal and calf veins including the posterior tibial, peroneal and gastrocnemius veins when visible. The superficial great saphenous vein was also interrogated. Spectral Doppler was utilized to evaluate flow at rest and with distal augmentation maneuvers in the common femoral, femoral and popliteal veins. COMPARISON:  None Available. FINDINGS: RIGHT LOWER EXTREMITY Common Femoral Vein: No evidence of thrombus. Normal compressibility, respiratory phasicity and response to augmentation. Saphenofemoral Junction: No evidence of thrombus. Normal compressibility and flow on color Doppler imaging. Profunda Femoral Vein: No evidence of thrombus. Normal compressibility and flow on color Doppler imaging. Femoral Vein: Occlusive thrombus. Popliteal Vein: Occlusive thrombus. Calf Veins: Occlusive thrombus in the posterior tibial and peroneal veins. Superficial Great Saphenous Vein: No evidence of thrombus. Normal compressibility. Venous Reflux:  None. Other Findings:  None. LEFT LOWER EXTREMITY Common Femoral Vein: No evidence of thrombus. Normal compressibility, respiratory phasicity and response to augmentation. Saphenofemoral Junction: No evidence of thrombus. Normal compressibility and flow on color Doppler imaging. Profunda Femoral Vein: No evidence of thrombus. Normal compressibility and flow on color Doppler imaging. Femoral Vein: No evidence of thrombus. Normal compressibility, respiratory phasicity and response to augmentation. Popliteal Vein: No evidence of thrombus. Normal compressibility, respiratory phasicity and response to  augmentation. Calf Veins: No evidence of thrombus. Normal compressibility and flow on color Doppler imaging. Superficial Great Saphenous Vein: No evidence of thrombus. Normal compressibility. Venous Reflux:  None. Other Findings:  None. IMPRESSION: 1. Occlusive thrombus in the right femoral, popliteal, posterior tibial and peroneal veins. 2. No evidence of deep venous thrombosis in the left lower extremity. Electronically Signed   By: Orvan Falconer M.D.   On: 03/07/2023 14:51   DG Chest Port 1 View  Result Date: 03/07/2023 CLINICAL DATA:  Dyspnea on exertion. EXAM: PORTABLE CHEST 1 VIEW COMPARISON:  None Available. FINDINGS: Low lung volumes accentuate the pulmonary vasculature and cardiomediastinal silhouette. No consolidation or edema. No pleural effusion or pneumothorax. Visualized bones and upper abdomen are unremarkable. IMPRESSION: No evidence of acute cardiopulmonary disease. Electronically Signed   By: Orvan Falconer M.D.   On:  03/07/2023 14:15   CT ABDOMEN PELVIS W CONTRAST  Result Date: 03/07/2023 CLINICAL DATA:  Left lower quadrant abdominal pain, diverticulitis. Complication suspected. EXAM: CT ABDOMEN AND PELVIS WITH CONTRAST TECHNIQUE: Multidetector CT imaging of the abdomen and pelvis was performed using the standard protocol following bolus administration of intravenous contrast. RADIATION DOSE REDUCTION: This exam was performed according to the departmental dose-optimization program which includes automated exposure control, adjustment of the mA and/or kV according to patient size and/or use of iterative reconstruction technique. CONTRAST:  OMNIPAQUE IOHEXOL 300 MG/ML  SOLN COMPARISON:  None Available. FINDINGS: Lower chest: There is a thrombus in the incompletely imaged right lower lobe pulmonary vessel concerning for pulmonary embolism. Hepatobiliary: No focal liver abnormality is seen. Status post cholecystectomy. Mild intrahepatic and prominent extrahepatic biliary duct, likely  secondary to cholecystectomy. Pancreas: Unremarkable. No pancreatic ductal dilatation or surrounding inflammatory changes. Spleen: Normal in size without focal abnormality. Adrenals/Urinary Tract: Adrenal glands are unremarkable. Kidneys are normal, without renal calculi, focal lesion, or hydronephrosis. Bladder is unremarkable. Stomach/Bowel: Stomach is within normal limits. Appendix not identified. Colonic diverticulosis prominent in the sigmoid colon without evidence of acute diverticulitis. Mild thickening of the sigmoid colonic wall, likely secondary to severe diverticulosis. No abdominal collection or abscess. No extraluminal free air. Vascular/Lymphatic: Aortic atherosclerosis. No enlarged abdominal or pelvic lymph nodes. Reproductive: Status post hysterectomy. No adnexal masses. Other: No abdominal wall hernia or abnormality. No abdominopelvic ascites. Musculoskeletal: Multilevel degenerate disc disease of the lumbar spine. No acute osseous abnormality. IMPRESSION: 1. Severe sigmoid diverticulosis without evidence of acute diverticulitis. Mild thickening of the sigmoid colonic wall, likely secondary to severe diverticulosis. No evidence of perforation or abscess. 2. There is a thrombus in the incompletely imaged right lower lobe pulmonary vessel concerning for pulmonary embolism. CT chest for further evaluation is suggested. 3. Status post cholecystectomy with mild intrahepatic and prominent extrahepatic biliary duct, likely secondary to cholecystectomy. 4. Aortic atherosclerosis. 5. Multilevel degenerate disc disease of the lumbar spine. Findings concerning for pulmonary embolism were called by telephone at the time of interpretation on 03/07/2023 at 9:49 am to provider [facility] , who verbally acknowledged these results. Electronically Signed   By: Larose Hires D.O.   On: 03/07/2023 09:49             LOS: 1 day    Time spent: 50 min     Sunnie Nielsen, DO [facility] 03/08/2023,  1:14 PM    Dictation software may have been used to generate the above note. Typos may occur and escape review in typed/dictated notes. Please contact Dr Lyn Hollingshead directly for clarity if needed.  Staff may message me via secure chat in Epic  but this may not receive an immediate response,  please page me for urgent matters!  If 7PM-7AM, please contact night coverage [email or web address]

## 2023-03-09 ENCOUNTER — Encounter
Admission: EM | Disposition: A | Discharge status: Skilled Nursing Facility | Payer: Self-pay | Source: Home / Self Care | Attending: Internal Medicine

## 2023-03-09 DIAGNOSIS — I50810 Right heart failure, unspecified: Secondary | ICD-10-CM

## 2023-03-09 DIAGNOSIS — R112 Nausea with vomiting, unspecified: Secondary | ICD-10-CM | POA: Diagnosis not present

## 2023-03-09 HISTORY — PX: PULMONARY THROMBECTOMY: CATH118295

## 2023-03-09 LAB — CBC
HCT: 41.4 % (ref 36.0–46.0)
Hemoglobin: 13.9 g/dL (ref 12.0–15.0)
MCH: 28.6 pg (ref 26.0–34.0)
MCHC: 33.6 g/dL (ref 30.0–36.0)
MCV: 85.2 fL (ref 80.0–100.0)
Platelets: 289 10*3/uL (ref 150–400)
RBC: 4.86 MIL/uL (ref 3.87–5.11)
RDW: 14.4 % (ref 11.5–15.5)
WBC: 10 10*3/uL (ref 4.0–10.5)
nRBC: 0 % (ref 0.0–0.2)

## 2023-03-09 LAB — URINE CULTURE: Culture: >=100000 — AB

## 2023-03-09 LAB — HEPARIN LEVEL (UNFRACTIONATED): Heparin Unfractionated: >1.1 IU/mL — ABNORMAL HIGH (ref 0.30–0.70)

## 2023-03-09 LAB — APTT: aPTT: 78 seconds — ABNORMAL HIGH (ref 24–36)

## 2023-03-09 SURGERY — PULMONARY THROMBECTOMY
Anesthesia: Moderate Sedation

## 2023-03-09 MED ORDER — FENTANYL CITRATE (PF) 100 MCG/2ML IJ SOLN
INTRAMUSCULAR | Status: DC | PRN
  Administered 2023-03-09: 12.5 ug via INTRAVENOUS
  Administered 2023-03-09: 25 ug via INTRAVENOUS
  Administered 2023-03-09: 50 ug via INTRAVENOUS
  Administered 2023-03-09: 25 ug via INTRAVENOUS

## 2023-03-09 MED ORDER — TRAMADOL HCL 50 MG PO TABS
ORAL_TABLET | ORAL | Status: AC
  Filled 2023-03-09: qty 1

## 2023-03-09 MED ORDER — SODIUM CHLORIDE 0.9 % IV SOLN
12.5000 mg | Freq: Once | INTRAVENOUS | Status: AC
  Administered 2023-03-09: 12.5 mg via INTRAVENOUS
  Filled 2023-03-09: qty 0.5

## 2023-03-09 MED ORDER — METHOCARBAMOL 1000 MG/10ML IJ SOLN
500.0000 mg | Freq: Once | INTRAVENOUS | Status: AC
  Administered 2023-03-09: 500 mg via INTRAVENOUS
  Filled 2023-03-09: qty 5

## 2023-03-09 MED ORDER — POTASSIUM CHLORIDE 20 MEQ PO PACK
40.0000 meq | PACK | Freq: Once | ORAL | Status: AC
  Administered 2023-03-09: 40 meq via ORAL
  Filled 2023-03-09: qty 2

## 2023-03-09 MED ORDER — HEPARIN (PORCINE) 25000 UT/250ML-% IV SOLN
850.0000 [IU]/h | INTRAVENOUS | Status: DC
  Administered 2023-03-09: 850 [IU]/h via INTRAVENOUS

## 2023-03-09 MED ORDER — MIDAZOLAM HCL 2 MG/ML PO SYRP
ORAL_SOLUTION | ORAL | Status: AC
  Filled 2023-03-09: qty 5

## 2023-03-09 MED ORDER — ONDANSETRON HCL 4 MG/2ML IJ SOLN
INTRAMUSCULAR | Status: AC
  Filled 2023-03-09: qty 2

## 2023-03-09 MED ORDER — IODIXANOL 320 MG/ML IV SOLN
INTRAVENOUS | Status: DC | PRN
  Administered 2023-03-09: 50 mL via INTRAVENOUS

## 2023-03-09 MED ORDER — OXYCODONE HCL 5 MG PO TABS
ORAL_TABLET | ORAL | Status: AC
  Filled 2023-03-09: qty 1

## 2023-03-09 MED ORDER — NITROFURANTOIN MONOHYD MACRO 100 MG PO CAPS
100.0000 mg | ORAL_CAPSULE | Freq: Two times a day (BID) | ORAL | Status: DC
  Administered 2023-03-09: 100 mg via ORAL
  Filled 2023-03-09: qty 1

## 2023-03-09 MED ORDER — SODIUM CHLORIDE 0.9 % IV SOLN
12.5000 mg | Freq: Once | INTRAVENOUS | Status: DC
  Filled 2023-03-09: qty 0.5

## 2023-03-09 MED ORDER — FENTANYL CITRATE PF 50 MCG/ML IJ SOSY
PREFILLED_SYRINGE | INTRAMUSCULAR | Status: AC
  Filled 2023-03-09: qty 1

## 2023-03-09 MED ORDER — HEPARIN (PORCINE) 25000 UT/250ML-% IV SOLN
INTRAVENOUS | Status: AC
  Filled 2023-03-09: qty 250

## 2023-03-09 MED ORDER — FENTANYL CITRATE (PF) 100 MCG/2ML IJ SOLN
INTRAMUSCULAR | Status: AC
  Filled 2023-03-09: qty 2

## 2023-03-09 MED ORDER — MIDAZOLAM HCL 2 MG/2ML IJ SOLN
INTRAMUSCULAR | Status: DC | PRN
  Administered 2023-03-09 (×3): 1 mg via INTRAVENOUS

## 2023-03-09 MED ORDER — HEPARIN SODIUM (PORCINE) 1000 UNIT/ML IJ SOLN
INTRAMUSCULAR | Status: AC
  Filled 2023-03-09: qty 10

## 2023-03-09 MED ORDER — ONDANSETRON HCL 4 MG/2ML IJ SOLN
4.0000 mg | Freq: Once | INTRAMUSCULAR | Status: AC
  Administered 2023-03-09: 4 mg via INTRAVENOUS

## 2023-03-09 MED ORDER — HEPARIN SODIUM (PORCINE) 1000 UNIT/ML IJ SOLN
INTRAMUSCULAR | Status: DC | PRN
  Administered 2023-03-09: 2000 [IU] via INTRAVENOUS

## 2023-03-09 MED ORDER — VANCOMYCIN HCL IN DEXTROSE 1-5 GM/200ML-% IV SOLN
INTRAVENOUS | Status: AC
  Filled 2023-03-09: qty 200

## 2023-03-09 MED ORDER — MIDAZOLAM HCL 2 MG/2ML IJ SOLN
INTRAMUSCULAR | Status: AC
  Filled 2023-03-09: qty 4

## 2023-03-09 SURGICAL SUPPLY — 21 items
CANISTER PENUMBRA ENGINE (MISCELLANEOUS) IMPLANT
CATH ANGIO 5F PIGTAIL 100CM (CATHETERS) IMPLANT
CATH INDIGO 12XTORQ 100 (CATHETERS) IMPLANT
CATH INDIGO SEP 12 (CATHETERS) IMPLANT
CATH INFINITI JR4 5F (CATHETERS) IMPLANT
CLOSURE PERCLOSE PROSTYLE (VASCULAR PRODUCTS) IMPLANT
COVER PROBE ULTRASOUND 5X96 (MISCELLANEOUS) IMPLANT
GLIDEWIRE ADV .035X260CM (WIRE) IMPLANT
KIT FEM OPTION ELITE FILTER (Filter) IMPLANT
NDL ENTRY 21GA 7CM ECHOTIP (NEEDLE) IMPLANT
NEEDLE ENTRY 21GA 7CM ECHOTIP (NEEDLE) ×1 IMPLANT
PACK ANGIOGRAPHY (CUSTOM PROCEDURE TRAY) ×2 IMPLANT
SELECT CATH BERN TIP 125CM (CATHETERS) IMPLANT
SET INTRO CAPELLA COAXIAL (SET/KITS/TRAYS/PACK) IMPLANT
SHEATH BRITE TIP 6FRX11 (SHEATH) IMPLANT
SHEATH FAST CATH 12F 12CM (SHEATH) IMPLANT
SUT SILK 0 FSL (SUTURE) IMPLANT
SYR MEDRAD MARK 7 150ML (SYRINGE) IMPLANT
TUBING CONTRAST HIGH PRESS 72 (TUBING) IMPLANT
WIRE AMPLATZ SS-J .035X260CM (WIRE) IMPLANT
WIRE GUIDERIGHT .035X150 (WIRE) IMPLANT

## 2023-03-09 NOTE — Progress Notes (Signed)
PT Cancellation Note  Patient Details Name: Marilyn Andrews MRN: [id number] DOB: [birth year]   Cancelled Treatment:    Reason Eval/Treat Not Completed: Medical issues which prohibited therapy: Per therapy PE protocol, PT evaluation will be completed after 48 hours of anticoagulation. Pt started on heparin at 1430 on 03/08/23. Will re-attempt evaluation as pt medically appropriate.     Ovidio Hanger PT, DPT 03/09/23, 8:36 AM

## 2023-03-09 NOTE — Op Note (Addendum)
[facility]  Percutaneous Study/Intervention Procedural Note   Date of Surgery: 03/09/2023,7:17 PM  Surgeon:Firas Guardado, Latina Craver   Pre-operative Diagnosis: Symptomatic pulmonary emboli with right heart strain and hypoxia  Post-operative diagnosis:  Same.  Likely patent foramen ovale  Procedure(s) Performed:  1.  Selective injection right subsegmental pulmonary arteries; right upper lobe, middle lobe and lower lobe pulmonary arteries.  2.  Selective injection left subsegmental pulmonary arteries; left lower lobe pulmonary arteries  3.  Mechanical thrombectomy bilateral lobar pulmonary arteries for removal of pulmonary emboli using the Penumbra CAT 8 thrombectomy catheter.             4.  Placement of an option Elite IVC filter infrarenal    Anesthesia: Conscious sedation was administered under my direct supervision by the interventional radiology RN. IV Versed plus fentanyl were utilized. Continuous ECG, pulse oximetry and blood pressure was monitored throughout the entire procedure.  Conscious sedation was administered for a total of 72 minutes.  Sheath: 12 French 11 cm sheath right common femoral vein antegrade  Contrast: 50 cc   Fluoroscopy Time: 10.8 minutes  Indications:  Patient presented to the hospital with hypoxia and shortness of breath. The patient is unable to get out of bed and transfer to a chair without increased dyspnea. The patient's O2 saturations off nasal cannula are in the 80%. CT angiogram demonstrated bilateral pulmonary emboli associated with significant right heart strain. Given the long-term sequela and the patient's symptomatic condition the risks and benefits for angiography with thrombectomy are reviewed all questions are answered, the patient agrees to proceed.  Procedure:  Marilyn Andrews a 87 y.o. female who was identified and appropriate procedural time out was performed.  The patient was then placed supine on the table and prepped and  draped in the usual sterile fashion.  Ultrasound was used to evaluate the right common femoral vein.  It was compressible indicating it is patent .  A digital ultrasound image was acquired for the permanent record.  A micropuncture needle was used to access the right common femoral vein under direct ultrasound guidance.  A microwire was then advanced under fluoroscopic guidance followed by micro-sheath.  A 0.035 J wire was advanced without resistance and a 5Fr sheath was placed.    2000 units of heparin was then given and allowed to circulate.  TPA was not used given the patient's comorbidities  The J-wire and pigtail catheter was advanced up to the right atrium where a bolus injection contrast was used to demonstrate the pulmonary artery outflow.  Of note and somewhat unusual there was near instant filling of the aortic arch from the bolus injection in the right atrium.  Stiff angled Glidewire was then used to negotiate the pigtail catheter into the pulmonary outflow track.  The right main pulmonary artery was evaluated first.  The Penumbra Cat 12 extra torque catheter was then advanced into the mid right main pulmonary artery.  Hand-injection contrast was then used to show the pulmonary vasculature thrombus was noted throughout the lobar and segmental arteries.  The penumbra CAT 12 was then negotiated into the thrombus of the right lower lobe and aspiration was performed using the CAT 12 catheter and a separator.  After multiple passes the catheter was then repositioned into the right middle lobe pulmonary artery and again hand-injection contrast was performed to verify position and evaluate the distal anatomy.  Multiple passes were made using mechanical aspiration in association with a separator.  Lastly, using a  combination of the separator catheter and Glidewire the CAT 12 device was negotiated into the right upper lobe pulmonary artery.  Hand-injection of contrast was used to verify positioning and  evaluate the distal anatomy.  Multiple passes were then performed using the separator with the CAT 12 penumbra catheter.  Once there was free flow of blood from all 3 lobar arteries the catheter was repositioned to the right main pulmonary artery.  Hand-injection contrast was then used to give an assessment of the effectiveness of thrombectomy.  An Amplatz wire was then exchanged for the stiff angle Glidewire and the select catheter was exchanged and the CAT 12 was negotiated into the left main pulmonary artery.  Then with the catheter in the left main pulmonary artery bolus injection contrast was utilized to demonstrate the thrombus as well as the segmental pulmonary artery vasculature. This demonstrated thrombus in the distal left main pulmonary artery extending into the left lower lobe artery and noting that it was near occlusive.    The Penumbra Cat 12 extra torque catheter was then advanced into the thrombus in the left lower lobe pulmonary artery.  Hand-injection contrast was used to verify the positioning and evaluate the distal anatomy.  Mechanical aspiration was performed using the CAT 8 catheter and a separator.  After multiple passes the catheter was then repositioned into the left main pulmonary artery and hand-injection contrast was then performed to give an assessment of the left pulmonary vasculature and the effectiveness of thrombectomy.  The CAT 12 catheter and wire was then removed from the pulmonary vasculature.    Given the findings of probable patent foramen ovale in the increased risk of stroke secondary to venous thromboembolism I felt that an IVC filter was clearly indicated.  The option Elite sheath was then advanced over the wire through the 12 French sheath and positioned just above the confluence of the common iliac veins.  Bolus injection of contrast was then used to create imaging of the inferior vena cava.  Renal blushes were noted at the L2 level.  Cava measured approximately  18 mm in diameter.  The catheter was then advanced to the level of the blushes and an option Elite filter advanced over the wire through the sheath and deployed just below the renal blushes without difficulty in an upright orientation.  After review these images the catheter and sheath were removed and the Perclose device was secured.  Pressure was held for an additional 5 minutes. There were no immediate complications.  Findings:   Right pulmonary artery: Initially there is thrombus noted in the distal right main pulmonary artery which extends into the right upper middle and lobar pulmonary arteries and into the segmental branches.  Following thrombectomy there appears to be greater than 80% removal of the thrombus with filling of the entire right lung all the way to the periphery.  Left pulmonary: Initially there is thrombus noted in the distal left main pulmonary artery which extends into the lower lobe.  The left upper lobe appears to be spared.  In the left lower lobe the thrombus appears to extend into the segmental branches.  Following thrombectomy there appears to be greater than 80% removal of the thrombus with filling of the entire lung now all the way to the periphery.  Of note on initial injection through the pigtail catheter there was near instant filling of the aortic arch suggesting a patent foramen ovale  Inferior vena cava is widely patent.  Renal blushes are at the L2  level.  IVC filter is deployed just below this level.    Disposition: Patient was taken to the recovery room in stable condition having tolerated the procedure well.  Marilyn Andrews 03/09/2023,7:17 PM

## 2023-03-09 NOTE — Progress Notes (Signed)
PROGRESS NOTE    Marilyn Andrews   VWU:[id number] DOB: 1936/02/12  DOA: 03/07/2023 Date of Service: 03/09/23 PCP: [facility]     Brief Narrative / Hospital Course:  [name] is a 87 year old female with history of diverticulosis with diverticulitis, hypertension, GERD, generalized weakness, chronic pain low back and bilateral knee pain, chronic osteoarthritis, erythrocytosis, who presents to the emergency department [facility] for chief concerns of intractable nausea and vomiting, left lower quadrant pain. Also (+)dysuria.  05/05: in ED, VSS, mild hypokalemia, mild AKI, abn UA w/ 6-10 WBC and many bacteria w/ 5 ketones, Procal <0.01. CXR no concerns. CT Abd/Pelv (+)Severe sigmoid diverticulosis, no acute diverticulitis. Mild thickening of the sigmoid colonic wall, likely d/t diverticulosis, no perforation/abscess. (+)thrombus in the incompletely imaged right lower lobe pulmonary vessel concerning for pulmonary embolism. CT chest for further evaluation is suggested. DVT US LE (+)occlusive thrombus in the right femoral, popliteal, posterior tibial and peroneal veins. Given apixiban, potassium, 1L NS, nausea control. Admitted to hospitalist service. Follow BMP and plan CTA if renal fxn appropriate. 05/06: AKI resolved, CTA chest (+)saddle PE w/ RH strain. Vascular consulted - plan thrombectomy tomorrow. Heparin gtt started. Echo ordered.  05/07: thrombectomy today. Echo EF 60-65, no RWMA, mild LVH, G1DD, RV systolic fxn WNL, tricuspid regurg signal inadequate to assess PA pressure.     Consultants:  Vascular Surgery   Procedures: none      ASSESSMENT & PLAN:   Principal Problem:   Intractable nausea and vomiting Active Problems:   Acute pulmonary embolism (HCC)   HTN (hypertension)   GERD (gastroesophageal reflux disease)   Depression   Generalized weakness   Chronic pain   Erythrocytosis   Hypokalemia   CKD stage 3a, GFR 45-59 ml/min (HCC)    Pyuria   Restless leg   Acute submassive saddle pulmonary embolism (HCC) w/ RV strain  Acute occlusive thrombus in the right femoral, popliteal, posterior tibial and peroneal veins Eliquis switched to heparin gtt Vascular planning thrombectomy  Echo --> no concerns   Intractable nausea and vomiting Presumed secondary to acute PE Symptomatic support with ondansetron 4 mg p.o./IV as needed for nausea and vomiting; Phenergan 12.5 mg IV every 6 hours as needed for refractory nausea and vomiting, 18 hours of coverage ordered LR 150 mL/h, 1 day ordered --> d/c, restart IV fluids as needed  Protonix 40 mg IV nightly, 2 doses ordered  HTN (hypertension) Amlodipine 10 mg daily resumed Hydralazine 5 mg IV every 8 hours as needed for SBP greater 175, 4 days ordered  GERD (gastroesophageal reflux disease) Protonix 40 mg IV nightly, 2 days ordered in setting of intractable nausea and vomiting  CKD stage 3a, GFR 45-59 ml/min (HCC) At baseline, with mild increase in serum creatinine and decrease in EGFR compared to prior, does not meet criteria for acute kidney injury; presumed secondary to prerenal, from GI loss Follow BMP   Pyuria but pt noew denies dysuria ESBL likely colonization D/c abx   Restless leg Initiation of Requip 0.5 mg nightly, first dose today, 2 doses ordered Iron panel  Hypokalemia Suspect secondary to GI loss Check magnesium level on admission --> WNL Replace as needed Monitor BMP  Depression Escitalopram 20 mg daily resumed Lorazepam 0.5 mg every 6 hours as needed for anxiety resumed  Chronic pain Home tizanidine 2 mg every 8 hours as needed for muscle spasms resumed Tramadol 50 mg twice daily as needed for moderate pain resumed  Generalized weakness Fall  precautions, aspiration, PT, OT, TOC consulted    DVT prophylaxis: n/a, treating w/ Eliquis  Pertinent IV fluids/nutrition: stopped IV fluids, NPO at [facility] for thrombectomy tomorrow  Central lines / invasive  devices: none  Code Status: DNR ACP documentation reviewed: 05/06, none on file   Current Admission Status: inpatient   TOC needs / Dispo plan: pending PT/OT Barriers to discharge / significant pending items: thrombectomy today, expect here 1-2 more days following procedure pending clinical course / vascular clearance            Subjective / Brief ROS:  Patient reports anxiety.  Denies dysuria Denies CP/SOB.  Tolerating diet.   Family Communication: none at this time will update after thrombectomy     Objective Findings:  Vitals:   03/09/23 0326 03/09/23 0816 03/09/23 1148 03/09/23 1416  BP: (!) 164/77 (!) 153/70 (!) 151/67 (!) 170/74  Pulse: 94 78 82 97  Resp: 17 18 16  (!) 21  Temp: 98.4 F (36.9 C) 98.4 F (36.9 C) 98.3 F (36.8 C) 98.9 F (37.2 C)  TempSrc: Oral   Oral  SpO2: 94% 94% 95% 92%  Weight:      Height:        Intake/Output Summary (Last 24 hours) at 03/09/2023 1517 Last data filed at 03/09/2023 1431 Gross per 24 hour  Intake 3932.47 ml  Output 2000 ml  Net 1932.47 ml    Filed Weights   03/07/23 1745  Weight: 74.5 kg    Examination:  She is VERY hard of hearing  Physical Exam Constitutional:      General: She is not in acute distress. Cardiovascular:     Rate and Rhythm: Normal rate and regular rhythm.  Pulmonary:     Effort: Pulmonary effort is normal. No respiratory distress.     Breath sounds: Normal breath sounds.  Abdominal:     General: Abdomen is flat.     Palpations: Abdomen is soft.  Skin:    General: Skin is warm and dry.  Neurological:     Mental Status: She is alert and oriented to person, place, and time. Mental status is at baseline.          Scheduled Medications:   amLODipine  10 mg Oral Daily   escitalopram  20 mg Oral Daily   feeding supplement  237 mL Oral BID BM   nitrofurantoin (macrocrystal-monohydrate)  100 mg Oral Q12H   rosuvastatin  20 mg Oral QHS   senna-docusate  2 tablet Oral BID     Continuous Infusions:  sodium chloride 75 mL/hr at 03/08/23 2044   heparin 850 Units/hr (03/09/23 0007)   vancomycin      PRN Medications:  acetaminophen **OR** acetaminophen, diphenhydrAMINE, famotidine, fentaNYL (SUBLIMAZE) injection, hydrALAZINE, HYDROmorphone (DILAUDID) injection, LORazepam, methylPREDNISolone (SOLU-MEDROL) injection, ondansetron **OR** ondansetron (ZOFRAN) IV, ondansetron (ZOFRAN) IV, oxyCODONE, senna-docusate, tiZANidine, traMADol  Antimicrobials from admission:  Anti-infectives (From admission, onward)    Start     Dose/Rate Route Frequency Ordered Stop   03/09/23 1100  nitrofurantoin (macrocrystal-monohydrate) (MACROBID) capsule 100 mg        100 mg Oral Every 12 hours 03/09/23 1005 03/14/23 0959   03/08/23 1531  vancomycin (VANCOCIN) IVPB 1000 mg/200 mL premix        1,000 mg 200 mL/hr over 60 Minutes Intravenous 60 min pre-op 03/08/23 1531     03/07/23 1500  cefTRIAXone (ROCEPHIN) 1 g in sodium chloride 0.9 % 100 mL IVPB  Status:  Discontinued  1 g 200 mL/hr over 30 Minutes Intravenous Every 24 hours 03/07/23 1420 03/09/23 1005           Data Reviewed:  I have personally reviewed the following...  CBC: Recent Labs  Lab 03/07/23 0813 03/08/23 0428 03/09/23 0519  WBC 9.5 10.4 10.0  NEUTROABS 7.7  --   --   HGB 14.9 13.0 13.9  HCT 44.6 40.2 41.4  MCV 85.6 87.0 85.2  PLT 258 245 289    Basic Metabolic Panel: Recent Labs  Lab 03/07/23 0813 03/08/23 0428  NA 140 139  K 3.0* 3.1*  CL 107 106  CO2 23 25  GLUCOSE 100* 92  BUN 8 5*  CREATININE 1.16* 0.79  CALCIUM 8.8* 8.3*  MG 2.1  --     GFR: Estimated Creatinine Clearance: 49.7 mL/min (by C-G formula based on SCr of 0.79 mg/dL). Liver Function Tests: Recent Labs  Lab 03/07/23 0813  AST 26  ALT 17  ALKPHOS 80  BILITOT 1.1  PROT 6.6  ALBUMIN 3.3*    Recent Labs  Lab 03/07/23 0813  LIPASE 26    No results for input(s): "AMMONIA" in the last 168  hours. Coagulation Profile: No results for input(s): "INR", "PROTIME" in the last 168 hours. Cardiac Enzymes: No results for input(s): "CKTOTAL", "CKMB", "CKMBINDEX", "TROPONINI" in the last 168 hours. BNP (last 3 results) No results for input(s): "PROBNP" in the last 8760 hours. HbA1C: No results for input(s): "HGBA1C" in the last 72 hours. CBG: No results for input(s): "GLUCAP" in the last 168 hours. Lipid Profile: No results for input(s): "CHOL", "HDL", "LDLCALC", "TRIG", "CHOLHDL", "LDLDIRECT" in the last 72 hours. Thyroid Function Tests: No results for input(s): "TSH", "T4TOTAL", "FREET4", "T3FREE", "THYROIDAB" in the last 72 hours. Anemia Panel: Recent Labs    03/08/23 0426  FERRITIN 147  TIBC 218*  IRON 68    Most Recent Urinalysis On File:     Component Value Date/Time   COLORURINE AMBER (A) 03/07/2023 1245   APPEARANCEUR HAZY (A) 03/07/2023 1245   APPEARANCEUR Hazy 03/27/2013 1114   LABSPEC 1.035 (H) 03/07/2023 1245   LABSPEC 1.026 03/27/2013 1114   PHURINE 8.0 03/07/2023 1245   GLUCOSEU NEGATIVE 03/07/2023 1245   GLUCOSEU Negative 03/27/2013 1114   HGBUR NEGATIVE 03/07/2023 1245   BILIRUBINUR NEGATIVE 03/07/2023 1245   BILIRUBINUR Negative 03/27/2013 1114   KETONESUR 5 (A) 03/07/2023 1245   PROTEINUR NEGATIVE 03/07/2023 1245   NITRITE NEGATIVE 03/07/2023 1245   LEUKOCYTESUR SMALL (A) 03/07/2023 1245   LEUKOCYTESUR Negative 03/27/2013 1114   Sepsis Labs: @LABRCNTIP (procalcitonin:4,lacticidven:4) Microbiology: Recent Results (from the past 240 hour(s))  Urine Culture     Status: Abnormal   Collection Time: 03/07/23 12:45 PM   Specimen: Urine, Random  Result Value Ref Range Status   Specimen Description   Final    URINE, RANDOM Performed at [facility], [address], [place], [place] [address]    Special Requests   Final    NONE Performed at [facility], [address] [place], [place] [address]    Culture (A)  Final     >=100,000 COLONIES/mL ESCHERICHIA COLI Confirmed Extended Spectrum Beta-Lactamase Producer (ESBL).  In bloodstream infections from ESBL organisms, carbapenems are preferred over piperacillin/tazobactam. They are shown to have a lower risk of mortality.    Report Status 03/09/2023 FINAL  Final   Organism ID, Bacteria ESCHERICHIA COLI (A)  Final      Susceptibility   Escherichia coli - MIC*  AMPICILLIN >=32 RESISTANT Resistant     CEFAZOLIN >=64 RESISTANT Resistant     CEFEPIME 16 RESISTANT Resistant     CEFTRIAXONE >=64 RESISTANT Resistant     CIPROFLOXACIN <=0.25 SENSITIVE Sensitive     GENTAMICIN <=1 SENSITIVE Sensitive     IMIPENEM <=0.25 SENSITIVE Sensitive     NITROFURANTOIN <=16 SENSITIVE Sensitive     TRIMETH/SULFA <=20 SENSITIVE Sensitive     AMPICILLIN/SULBACTAM 8 SENSITIVE Sensitive     PIP/TAZO <=4 SENSITIVE Sensitive     * >=100,000 COLONIES/mL ESCHERICHIA COLI      Radiology Studies last 3 days: ECHOCARDIOGRAM COMPLETE  Result Date: 03/08/2023    ECHOCARDIOGRAM REPORT   Patient Name:   EPONINE KOELLER Forand Date of Exam: 03/08/2023 Medical Rec #:  [id number]    Height:       63.0 in Accession #:    [id number]   Weight:       164.2 lb Date of Birth:  [birth year]    BSA:          1.778 m Patient Age:    87 years     BP:           106/61 mmHg Patient Gender: F            HR:           76 bpm. Exam Location:  [facility] Procedure: 2D Echo, Cardiac Doppler and Color Doppler Indications:     Pulmonary embolus  History:         Patient has no prior history of Echocardiogram examinations.                  Risk Factors:Hypertension. Pulmonary embolus.  Sonographer:     Mikki Harbor Referring Phys:  [id number] Sunnie Nielsen Diagnosing Phys: Lorine Bears MD IMPRESSIONS  1. Left ventricular ejection fraction, by estimation, is 60 to 65%. The left ventricle has normal function. The left ventricle has no regional wall motion abnormalities. There is mild left ventricular hypertrophy. Left ventricular  diastolic parameters are consistent with Grade I diastolic dysfunction (impaired relaxation).  2. Right ventricular systolic function is normal. The right ventricular size is normal. Tricuspid regurgitation signal is inadequate for assessing PA pressure.  3. The mitral valve is normal in structure. No evidence of mitral valve regurgitation. No evidence of mitral stenosis.  4. The aortic valve is normal in structure. Aortic valve regurgitation is not visualized. No aortic stenosis is present.  5. The inferior vena cava is normal in size with greater than 50% respiratory variability, suggesting right atrial pressure of 3 mmHg. FINDINGS  Left Ventricle: Left ventricular ejection fraction, by estimation, is 60 to 65%. The left ventricle has normal function. The left ventricle has no regional wall motion abnormalities. The left ventricular internal cavity size was normal in size. There is  mild left ventricular hypertrophy. Left ventricular diastolic parameters are consistent with Grade I diastolic dysfunction (impaired relaxation). Right Ventricle: The right ventricular size is normal. No increase in right ventricular wall thickness. Right ventricular systolic function is normal. Tricuspid regurgitation signal is inadequate for assessing PA pressure. Left Atrium: Left atrial size was normal in size. Right Atrium: Right atrial size was normal in size. Pericardium: There is no evidence of pericardial effusion. Mitral Valve: The mitral valve is normal in structure. No evidence of mitral valve regurgitation. No evidence of mitral valve stenosis. MV peak gradient, 4.7 mmHg. The mean mitral valve gradient is 2.0 mmHg. Tricuspid Valve: The tricuspid valve is normal in  structure. Tricuspid valve regurgitation is not demonstrated. No evidence of tricuspid stenosis. Aortic Valve: The aortic valve is normal in structure. Aortic valve regurgitation is not visualized. No aortic stenosis is present. Aortic valve mean gradient  measures 5.0 mmHg. Aortic valve peak gradient measures 9.4 mmHg. Aortic valve area, by VTI measures 2.63 cm. Pulmonic Valve: The pulmonic valve was normal in structure. Pulmonic valve regurgitation is trivial. No evidence of pulmonic stenosis. Aorta: The aortic root is normal in size and structure. Venous: The inferior vena cava is normal in size with greater than 50% respiratory variability, suggesting right atrial pressure of 3 mmHg. IAS/Shunts: No atrial level shunt detected by color flow Doppler.  LEFT VENTRICLE PLAX 2D LVIDd:         4.10 cm   Diastology LVIDs:         2.60 cm   LV e' medial:    9.90 cm/s LV PW:         1.10 cm   LV E/e' medial:  8.2 LV IVS:        1.20 cm   LV e' lateral:   9.46 cm/s LVOT diam:     1.90 cm   LV E/e' lateral: 8.6 LV SV:         84 LV SV Index:   48 LVOT Area:     2.84 cm  RIGHT VENTRICLE RV Basal diam:  3.40 cm RV Mid diam:    2.40 cm RV S prime:     19.60 cm/s TAPSE (M-mode): 2.6 cm LEFT ATRIUM             Index        RIGHT ATRIUM           Index LA diam:        3.20 cm 1.80 cm/m   RA Area:     15.10 cm LA Vol (A2C):   31.7 ml 17.83 ml/m  RA Volume:   34.00 ml  19.12 ml/m LA Vol (A4C):   29.8 ml 16.76 ml/m LA Biplane Vol: 31.9 ml 17.94 ml/m  AORTIC VALVE                     PULMONIC VALVE AV Area (Vmax):    2.48 cm      PV Vmax:       1.24 m/s AV Area (Vmean):   2.34 cm      PV Peak grad:  6.2 mmHg AV Area (VTI):     2.63 cm AV Vmax:           153.00 cm/s AV Vmean:          100.000 cm/s AV VTI:            0.321 m AV Peak Grad:      9.4 mmHg AV Mean Grad:      5.0 mmHg LVOT Vmax:         134.00 cm/s LVOT Vmean:        82.500 cm/s LVOT VTI:          0.298 m LVOT/AV VTI ratio: 0.93  AORTA Ao Root diam: 2.90 cm Ao Asc diam:  3.30 cm MITRAL VALVE MV Area (PHT): 3.56 cm     SHUNTS MV Area VTI:   2.99 cm     Systemic VTI:  0.30 m MV Peak grad:  4.7 mmHg     Systemic Diam: 1.90 cm MV Mean grad:  2.0 mmHg MV Vmax:  1.08 m/s MV Vmean:      63.0 cm/s MV Decel Time: 213  msec MV E velocity: 81.20 cm/s MV A velocity: 104.00 cm/s MV E/A ratio:  0.78 Lorine Bears MD Electronically signed by Lorine Bears MD Signature Date/Time: 03/08/2023/4:49:33 PM    Final    CT Angio Chest Pulmonary Embolism (PE) W or WO Contrast  Result Date: 03/08/2023 CLINICAL DATA:  Presents with nausea, vomiting, left lower quadrant pain. EXAM: CT ANGIOGRAPHY CHEST WITH CONTRAST TECHNIQUE: Multidetector CT imaging of the chest was performed using the standard protocol during bolus administration of intravenous contrast. Multiplanar CT image reconstructions and MIPs were obtained to evaluate the vascular anatomy. RADIATION DOSE REDUCTION: This exam was performed according to the departmental dose-optimization program which includes automated exposure control, adjustment of the mA and/or kV according to patient size and/or use of iterative reconstruction technique. CONTRAST:  75mL OMNIPAQUE IOHEXOL 350 MG/ML SOLN COMPARISON:  Chest radiograph 1 day prior, CT abdomen/pelvis 1 day prior FINDINGS: Cardiovascular: There is acute saddle pulmonary embolism straddling the main pulmonary artery bifurcation with a moderate clot burden in both central pulmonary arteries extending to the segmental and sub segmental arteries, primarily involving the lower lobes. The clot on the left is primarily nonocclusive. The heart is enlarged. There is no pericardial effusion. There is mild calcified plaque in the nonaneurysmal thoracic aorta. There is evidence of right heart strain with right to left heart ratio measuring 1.18. Mediastinum/Nodes: The thyroid is unremarkable. The esophagus is grossly unremarkable. There is no mediastinal, hilar, or axillary lymphadenopathy. Lungs/Pleura: The trachea and central airways are patent. Lung volumes are low. Opacities in the lung bases likely reflect dependent subsegmental atelectasis. There is mild interlobular septal thickening in the lung apices. There is no focal airspace  consolidation. There is no pleural effusion. There is no pneumothorax. There are no suspicious nodules. Upper Abdomen: The imaged portions of the upper abdominal viscera are unremarkable. Musculoskeletal: There is no acute osseous abnormality or suspicious osseous lesion. Review of the MIP images confirms the above findings. IMPRESSION: 1. Acute saddle pulmonary embolism with a moderate clot burden in both central pulmonary arteries extending to the segmental and sub segmental arteries, primarily involving the lower lobes. There is evidence of right heart strain with right to left heart ratio measuring 1.18, consistent with at least submassive (intermediate risk) PE. The presence of right heart strain has been associated with an increased risk of morbidity and mortality. Please refer to the "Code PE Focused" order set in EPIC. 2. Cardiomegaly with mild interlobular septal thickening in the lung apices suggesting mild interstitial edema. Critical Value/emergent results were called by telephone at the time of interpretation on 03/08/2023 at 12:42 pm to provider Sunnie Nielsen , who verbally acknowledged these results. Electronically Signed   By: Lesia Hausen M.D.   On: 03/08/2023 12:42   US Venous Img Lower Bilateral (DVT)  Result Date: 03/07/2023 CLINICAL DATA:  Pulmonary embolus. EXAM: BILATERAL LOWER EXTREMITY VENOUS DOPPLER ULTRASOUND TECHNIQUE: Gray-scale sonography with graded compression, as well as color Doppler and duplex ultrasound were performed to evaluate the lower extremity deep venous systems from the level of the common femoral vein and including the common femoral, femoral, profunda femoral, popliteal and calf veins including the posterior tibial, peroneal and gastrocnemius veins when visible. The superficial great saphenous vein was also interrogated. Spectral Doppler was utilized to evaluate flow at rest and with distal augmentation maneuvers in the common femoral, femoral and popliteal veins.  COMPARISON:  None Available.  FINDINGS: RIGHT LOWER EXTREMITY Common Femoral Vein: No evidence of thrombus. Normal compressibility, respiratory phasicity and response to augmentation. Saphenofemoral Junction: No evidence of thrombus. Normal compressibility and flow on color Doppler imaging. Profunda Femoral Vein: No evidence of thrombus. Normal compressibility and flow on color Doppler imaging. Femoral Vein: Occlusive thrombus. Popliteal Vein: Occlusive thrombus. Calf Veins: Occlusive thrombus in the posterior tibial and peroneal veins. Superficial Great Saphenous Vein: No evidence of thrombus. Normal compressibility. Venous Reflux:  None. Other Findings:  None. LEFT LOWER EXTREMITY Common Femoral Vein: No evidence of thrombus. Normal compressibility, respiratory phasicity and response to augmentation. Saphenofemoral Junction: No evidence of thrombus. Normal compressibility and flow on color Doppler imaging. Profunda Femoral Vein: No evidence of thrombus. Normal compressibility and flow on color Doppler imaging. Femoral Vein: No evidence of thrombus. Normal compressibility, respiratory phasicity and response to augmentation. Popliteal Vein: No evidence of thrombus. Normal compressibility, respiratory phasicity and response to augmentation. Calf Veins: No evidence of thrombus. Normal compressibility and flow on color Doppler imaging. Superficial Great Saphenous Vein: No evidence of thrombus. Normal compressibility. Venous Reflux:  None. Other Findings:  None. IMPRESSION: 1. Occlusive thrombus in the right femoral, popliteal, posterior tibial and peroneal veins. 2. No evidence of deep venous thrombosis in the left lower extremity. Electronically Signed   By: Orvan Falconer M.D.   On: 03/07/2023 14:51   DG Chest Port 1 View  Result Date: 03/07/2023 CLINICAL DATA:  Dyspnea on exertion. EXAM: PORTABLE CHEST 1 VIEW COMPARISON:  None Available. FINDINGS: Low lung volumes accentuate the pulmonary vasculature and  cardiomediastinal silhouette. No consolidation or edema. No pleural effusion or pneumothorax. Visualized bones and upper abdomen are unremarkable. IMPRESSION: No evidence of acute cardiopulmonary disease. Electronically Signed   By: Orvan Falconer M.D.   On: 03/07/2023 14:15   CT ABDOMEN PELVIS W CONTRAST  Result Date: 03/07/2023 CLINICAL DATA:  Left lower quadrant abdominal pain, diverticulitis. Complication suspected. EXAM: CT ABDOMEN AND PELVIS WITH CONTRAST TECHNIQUE: Multidetector CT imaging of the abdomen and pelvis was performed using the standard protocol following bolus administration of intravenous contrast. RADIATION DOSE REDUCTION: This exam was performed according to the departmental dose-optimization program which includes automated exposure control, adjustment of the mA and/or kV according to patient size and/or use of iterative reconstruction technique. CONTRAST:  OMNIPAQUE IOHEXOL 300 MG/ML  SOLN COMPARISON:  None Available. FINDINGS: Lower chest: There is a thrombus in the incompletely imaged right lower lobe pulmonary vessel concerning for pulmonary embolism. Hepatobiliary: No focal liver abnormality is seen. Status post cholecystectomy. Mild intrahepatic and prominent extrahepatic biliary duct, likely secondary to cholecystectomy. Pancreas: Unremarkable. No pancreatic ductal dilatation or surrounding inflammatory changes. Spleen: Normal in size without focal abnormality. Adrenals/Urinary Tract: Adrenal glands are unremarkable. Kidneys are normal, without renal calculi, focal lesion, or hydronephrosis. Bladder is unremarkable. Stomach/Bowel: Stomach is within normal limits. Appendix not identified. Colonic diverticulosis prominent in the sigmoid colon without evidence of acute diverticulitis. Mild thickening of the sigmoid colonic wall, likely secondary to severe diverticulosis. No abdominal collection or abscess. No extraluminal free air. Vascular/Lymphatic: Aortic atherosclerosis. No  enlarged abdominal or pelvic lymph nodes. Reproductive: Status post hysterectomy. No adnexal masses. Other: No abdominal wall hernia or abnormality. No abdominopelvic ascites. Musculoskeletal: Multilevel degenerate disc disease of the lumbar spine. No acute osseous abnormality. IMPRESSION: 1. Severe sigmoid diverticulosis without evidence of acute diverticulitis. Mild thickening of the sigmoid colonic wall, likely secondary to severe diverticulosis. No evidence of perforation or abscess. 2. There is a thrombus in the incompletely  imaged right lower lobe pulmonary vessel concerning for pulmonary embolism. CT chest for further evaluation is suggested. 3. Status post cholecystectomy with mild intrahepatic and prominent extrahepatic biliary duct, likely secondary to cholecystectomy. 4. Aortic atherosclerosis. 5. Multilevel degenerate disc disease of the lumbar spine. Findings concerning for pulmonary embolism were called by telephone at the time of interpretation on 03/07/2023 at 9:49 am to provider [facility] , who verbally acknowledged these results. Electronically Signed   By: Larose Hires D.O.   On: 03/07/2023 09:49             LOS: 2 days    Time spent: 50 min     Sunnie Nielsen, DO [facility] 03/09/2023, 3:17 PM    Dictation software may have been used to generate the above note. Typos may occur and escape review in typed/dictated notes. Please contact Dr Lyn Hollingshead directly for clarity if needed.  Staff may message me via secure chat in Epic  but this may not receive an immediate response,  please page me for urgent matters!  If 7PM-7AM, please contact night coverage [email or web address]

## 2023-03-09 NOTE — TOC Initial Note (Signed)
Transition of Care [facility]) - Initial/Assessment Note    Patient Details  Name: Marilyn Andrews MRN: [id number] Date of Birth: [birth year]  [facility]) CM/SW Contact:    Truddie Hidden, RN Phone Number: 03/09/2023, 3:07 PM  Clinical Narrative:                 Per Tammy at [facility] patient was STR.           Patient Goals and CMS Choice            Expected Discharge Plan and Services                                              Prior Living Arrangements/Services                       Activities of Daily Living Home Assistive Devices/Equipment: Eyeglasses, Dan Humphreys (specify type) ADL Screening (condition at time of admission) Patient's cognitive ability adequate to safely complete daily activities?: Yes Is the patient deaf or have difficulty hearing?: Yes Does the patient have difficulty seeing, even when wearing glasses/contacts?: No Does the patient have difficulty concentrating, remembering, or making decisions?: No Patient able to express need for assistance with ADLs?: Yes Does the patient have difficulty dressing or bathing?: Yes Independently performs ADLs?: No Communication: Independent Dressing (OT): Needs assistance Is this a change from baseline?: Pre-admission baseline Grooming: Needs assistance Is this a change from baseline?: Pre-admission baseline Feeding: Independent Bathing: Needs assistance Is this a change from baseline?: Pre-admission baseline Toileting: Needs assistance Is this a change from baseline?: Pre-admission baseline In/Out Bed: Independent Walks in Home: Needs assistance Is this a change from baseline?: Pre-admission baseline Does the patient have difficulty walking or climbing stairs?: Yes Weakness of Legs: Both Weakness of Arms/Hands: None  Permission Sought/Granted                  Emotional Assessment              Admission diagnosis:  Intractable nausea and vomiting [R11.2] Other  acute pulmonary embolism, unspecified whether acute cor pulmonale present (HCC) [I26.99] Nausea and vomiting, unspecified vomiting type [R11.2] Intractable nausea [R11.0] Patient Active Problem List   Diagnosis Date Noted   Intractable nausea and vomiting 03/07/2023   Acute pulmonary embolism (HCC) 03/07/2023   CKD stage 3a, GFR 45-59 ml/min (HCC) 03/07/2023   Pyuria 03/07/2023   Restless leg 03/07/2023   Left upper extremity swelling 02/18/2023   Hypokalemia 02/09/2023   Chronic pain 02/08/2023   Erythrocytosis 02/08/2023   Generalized weakness 09/17/2020   GERD (gastroesophageal reflux disease) 09/09/2020   Depression 09/09/2020   HTN (hypertension) 03/05/2020   UTI (urinary tract infection) 07/28/2018   Severe recurrent major depression without psychotic features (HCC) 03/08/2018   Diverticulitis 07/01/2016   PCP:  [facility] Pharmacy:  No Pharmacies Listed    Social Determinants of Health (SDOH) Social History: SDOH Screenings   Food Insecurity: No Food Insecurity (03/07/2023)  Housing: Low Risk  (03/07/2023)  Transportation Needs: No Transportation Needs (03/07/2023)  Utilities: Not At Risk (03/07/2023)  Tobacco Use: Low Risk  (03/07/2023)   SDOH Interventions: Housing Interventions: Intervention Not Indicated   Readmission Risk Interventions     No data to display

## 2023-03-09 NOTE — Consult Note (Addendum)
ANTICOAGULATION CONSULT NOTE - Initial Consult  Pharmacy Consult for: Heparin  Indication: pulmonary embolus and DVT  Allergies  Allergen Reactions   Penicillins Other (See Comments)    Reaction as a child, patient does not recall  Has patient had a PCN reaction causing immediate rash, facial/tongue/throat swelling, SOB or lightheadedness with hypotension: unknown Has patient had a PCN reaction causing severe rash involving mucus membranes or skin necrosis: unknown Has patient had a PCN reaction that required hospitalization: unknown Has patient had a PCN reaction occurring within the last 10 years: No If all of the above answers are "NO", then may proceed with Cephalosporin use.   Sulfa Antibiotics Nausea Only    Patient Measurements: Height: 5\' 3"  (160 cm) Weight: 74.5 kg (164 lb 3.9 oz) IBW/kg (Calculated) : 52.4 Heparin Dosing Weight: 68.2 kg  Vital Signs: Temp: 98.9 F (37.2 C) (05/07 1416) Temp Source: Oral (05/07 1416) BP: 136/61 (05/07 1900) Pulse Rate: 83 (05/07 1900)  Labs: Recent Labs    03/07/23 0813 03/08/23 0428 03/08/23 2224 03/09/23 0519 03/09/23 0927  HGB 14.9 13.0  --  13.9  --   HCT 44.6 40.2  --  41.4  --   PLT 258 245  --  289  --   APTT  --   --  123*  --  78*  HEPARINUNFRC  --   --   --   --  >1.10*  CREATININE 1.16* 0.79  --   --   --      Estimated Creatinine Clearance: 49.7 mL/min (by C-G formula based on SCr of 0.79 mg/dL).   Medical History: Past Medical History:  Diagnosis Date   Acute diverticulitis 03/07/2018   Anxiety    Arthritis    Depression    Diverticulitis    Dyspnea    Edema    FEET/LEGS   HOH (hard of hearing)    Hypertension    Hypokalemia 02/09/2023    Medications:  Scheduled:   [MAR Hold] amLODipine  10 mg Oral Daily   [MAR Hold] escitalopram  20 mg Oral Daily   [MAR Hold] feeding supplement  237 mL Oral BID BM   [MAR Hold] rosuvastatin  20 mg Oral QHS   [MAR Hold] senna-docusate  2 tablet Oral BID    Infusions:   heparin     PRN: [MAR Hold] acetaminophen **OR** [MAR Hold] acetaminophen, [MAR Hold] fentaNYL (SUBLIMAZE) injection, [MAR Hold] hydrALAZINE, [MAR Hold]  HYDROmorphone (DILAUDID) injection, iodixanol, [MAR Hold] LORazepam, [MAR Hold] ondansetron **OR** [MAR Hold] ondansetron (ZOFRAN) IV, [MAR Hold] ondansetron (ZOFRAN) IV, [MAR Hold] oxyCODONE, [MAR Hold] senna-docusate, [MAR Hold] tiZANidine, [MAR Hold] traMADol Anti-infectives (From admission, onward)    Start     Dose/Rate Route Frequency Ordered Stop   03/09/23 1100  nitrofurantoin (macrocrystal-monohydrate) (MACROBID) capsule 100 mg  Status:  Discontinued        100 mg Oral Every 12 hours 03/09/23 1005 03/09/23 1518   03/08/23 1531  vancomycin (VANCOCIN) IVPB 1000 mg/200 mL premix        1,000 mg 200 mL/hr over 60 Minutes Intravenous 60 min pre-op 03/08/23 1531 03/09/23 1736   03/07/23 1500  cefTRIAXone (ROCEPHIN) 1 g in sodium chloride 0.9 % 100 mL IVPB  Status:  Discontinued        1 g 200 mL/hr over 30 Minutes Intravenous Every 24 hours 03/07/23 1420 03/09/23 1005       Assessment: 87 yr female with PMH: diverticulosis and HTN presented to the ED with N/V.  Pulmonary embolism found via CT Angiography Chest with Contrast. Patient started on apixaban (Eliquis) 10mg  twice daily yesterday (5/5) x 7 days followed by apixaban (Eliquis) 5mg  twice daily for DVT/PE. This has been stopped due to plans for pulmonary thrombectomy tomorrow (5/7). Last dose apixaban was 5/6 08:33. Apixaban may falsely elevate heparin anti-Xa level temporarily and aPTT level will be used to monitor heparin.   Per CT Angiography Chest w/ Contrast:  Acute saddle pulmonary embolism with a moderate clot burden in both central pulmonary arteries extending to the segmental and subsegmental arteries, primarily involving the lower lobes. There is evidence of right heart strain with right to left heart ratio measuring 1.18, consistent with at least submassive  (intermediate risk) PE. The presence of right heart strain has been associated with an increased risk of morbidity and mortality.  Goal of Therapy:  aPTT 66-102 seconds Heparin level 0.3-0.7 once heparin level and aPTT is correlating.  Monitor platelets by anticoagulation protocol: Yes  Date Time aPTT/HL Rate/Comment 5/6 2224 123 s  Supratherapeutic 5/7 0927 78 s/>1.10 Therapeutic x 1; not correlating  Plan:  Restart heparin at 850 units/hr at 2100. Recheck aPTT in 8 hours. CBC and heparin level daily while on heparin.   Paschal Dopp, PharmD, BCPS 03/09/2023 7:11 PM

## 2023-03-09 NOTE — Consult Note (Signed)
ANTICOAGULATION CONSULT NOTE - Initial Consult  Pharmacy Consult for: Heparin  Indication: pulmonary embolus and DVT  Allergies  Allergen Reactions   Penicillins Other (See Comments)    Reaction as a child, patient does not recall  Has patient had a PCN reaction causing immediate rash, facial/tongue/throat swelling, SOB or lightheadedness with hypotension: unknown Has patient had a PCN reaction causing severe rash involving mucus membranes or skin necrosis: unknown Has patient had a PCN reaction that required hospitalization: unknown Has patient had a PCN reaction occurring within the last 10 years: No If all of the above answers are "NO", then may proceed with Cephalosporin use.   Sulfa Antibiotics Nausea Only    Patient Measurements: Height: 5\' 3"  (160 cm) Weight: 74.5 kg (164 lb 3.9 oz) IBW/kg (Calculated) : 52.4 Heparin Dosing Weight: 68.2 kg  Vital Signs: Temp: 98.4 F (36.9 C) (05/07 0816) Temp Source: Oral (05/07 0326) BP: 153/70 (05/07 0816) Pulse Rate: 78 (05/07 0816)  Labs: Recent Labs    03/07/23 0813 03/08/23 0428 03/08/23 2224 03/09/23 0519 03/09/23 0927  HGB 14.9 13.0  --  13.9  --   HCT 44.6 40.2  --  41.4  --   PLT 258 245  --  289  --   APTT  --   --  123*  --  78*  HEPARINUNFRC  --   --   --   --  >1.10*  CREATININE 1.16* 0.79  --   --   --      Estimated Creatinine Clearance: 49.7 mL/min (by C-G formula based on SCr of 0.79 mg/dL).   Medical History: Past Medical History:  Diagnosis Date   Acute diverticulitis 03/07/2018   Anxiety    Arthritis    Depression    Diverticulitis    Dyspnea    Edema    FEET/LEGS   HOH (hard of hearing)    Hypertension    Hypokalemia 02/09/2023    Medications:  Scheduled:   amLODipine  10 mg Oral Daily   escitalopram  20 mg Oral Daily   feeding supplement  237 mL Oral BID BM   nitrofurantoin (macrocrystal-monohydrate)  100 mg Oral Q12H   rosuvastatin  20 mg Oral QHS   senna-docusate  2 tablet Oral  BID   Infusions:   sodium chloride 75 mL/hr at 03/08/23 2044   heparin 850 Units/hr (03/09/23 0007)   vancomycin     PRN: acetaminophen **OR** acetaminophen, diphenhydrAMINE, famotidine, fentaNYL (SUBLIMAZE) injection, hydrALAZINE, HYDROmorphone (DILAUDID) injection, LORazepam, methylPREDNISolone (SOLU-MEDROL) injection, midazolam, ondansetron **OR** ondansetron (ZOFRAN) IV, ondansetron (ZOFRAN) IV, oxyCODONE, senna-docusate, tiZANidine, traMADol Anti-infectives (From admission, onward)    Start     Dose/Rate Route Frequency Ordered Stop   03/09/23 1100  nitrofurantoin (macrocrystal-monohydrate) (MACROBID) capsule 100 mg        100 mg Oral Every 12 hours 03/09/23 1005 03/14/23 0959   03/08/23 1531  vancomycin (VANCOCIN) IVPB 1000 mg/200 mL premix        1,000 mg 200 mL/hr over 60 Minutes Intravenous 60 min pre-op 03/08/23 1531     03/07/23 1500  cefTRIAXone (ROCEPHIN) 1 g in sodium chloride 0.9 % 100 mL IVPB  Status:  Discontinued        1 g 200 mL/hr over 30 Minutes Intravenous Every 24 hours 03/07/23 1420 03/09/23 1005       Assessment: 87 yr female with PMH: diverticulosis and HTN presented to the ED with N/V. Pulmonary embolism found via CT Angiography Chest with Contrast. Patient started  on apixaban (Eliquis) 10mg  twice daily yesterday (5/5) x 7 days followed by apixaban (Eliquis) 5mg  twice daily for DVT/PE. This has been stopped due to plans for pulmonary thrombectomy tomorrow (5/7). Last dose apixaban was 5/6 08:33. Apixaban may falsely elevate heparin anti-Xa level temporarily and aPTT level will be used to monitor heparin.   Per CT Angiography Chest w/ Contrast:  Acute saddle pulmonary embolism with a moderate clot burden in both central pulmonary arteries extending to the segmental and subsegmental arteries, primarily involving the lower lobes. There is evidence of right heart strain with right to left heart ratio measuring 1.18, consistent with at least submassive (intermediate  risk) PE. The presence of right heart strain has been associated with an increased risk of morbidity and mortality.  Goal of Therapy:  aPTT 66-102 seconds Heparin level 0.3-0.7 once heparin level and aPTT is correlating.  Monitor platelets by anticoagulation protocol: Yes  Date Time aPTT/HL Rate/Comment 5/6 2224 123 s  Supratherapeutic 5/7 0927 78 s/>1.10 Therapeutic x 1; not correlating  Plan:  Last dose of apixaban on 5/6 at 0833 Continue heparin infusion at 850 un/hr Check aPTT level in 8 hours and daily once consecutively therapeutic.  Titrate by aPTT's until lab correlation is noted, then titrate by anti-xa alone. Continue to monitor H&H and platelets daily while on heparin gtt.  Will M. Dareen Piano, PharmD PGY-1 Pharmacy Resident 03/09/2023 10:15 AM

## 2023-03-09 NOTE — Progress Notes (Signed)
OT Cancellation Note  Patient Details Name: Sharron Flory Muff MRN: [id number] DOB: 1936-04-24   Cancelled Treatment:    Reason Eval/Treat Not Completed: Medical issues which prohibited therapy. Per therapy protocol, OT evaluation will be completed after 48 hours of anticoagulation. Pt started on heparin at 1430 on 03/08/23. Will re-attempt evaluation as pt medically appropriate.    Dorene Grebe  [facility] 03/09/2023, 7:49 AM

## 2023-03-10 ENCOUNTER — Encounter: Payer: Self-pay | Admitting: Vascular Surgery

## 2023-03-10 DIAGNOSIS — R112 Nausea with vomiting, unspecified: Secondary | ICD-10-CM | POA: Diagnosis not present

## 2023-03-10 DIAGNOSIS — I82431 Acute embolism and thrombosis of right popliteal vein: Secondary | ICD-10-CM | POA: Diagnosis not present

## 2023-03-10 DIAGNOSIS — I2699 Other pulmonary embolism without acute cor pulmonale: Secondary | ICD-10-CM | POA: Diagnosis not present

## 2023-03-10 DIAGNOSIS — Z9889 Other specified postprocedural states: Secondary | ICD-10-CM

## 2023-03-10 DIAGNOSIS — I82441 Acute embolism and thrombosis of right tibial vein: Secondary | ICD-10-CM | POA: Diagnosis not present

## 2023-03-10 DIAGNOSIS — Z95828 Presence of other vascular implants and grafts: Secondary | ICD-10-CM

## 2023-03-10 DIAGNOSIS — I82411 Acute embolism and thrombosis of right femoral vein: Secondary | ICD-10-CM | POA: Diagnosis not present

## 2023-03-10 LAB — BASIC METABOLIC PANEL
Anion gap: 7 (ref 5–15)
BUN: 7 mg/dL — ABNORMAL LOW (ref 8–23)
CO2: 24 mmol/L (ref 22–32)
Calcium: 8 mg/dL — ABNORMAL LOW (ref 8.9–10.3)
Chloride: 109 mmol/L (ref 98–111)
Creatinine, Ser: 0.76 mg/dL (ref 0.44–1.00)
GFR, Estimated: >60 mL/min (ref >60)
Glucose, Bld: 86 mg/dL (ref 70–99)
Potassium: 2.8 mmol/L — ABNORMAL LOW (ref 3.5–5.1)
Sodium: 140 mmol/L (ref 135–145)

## 2023-03-10 LAB — CBC
HCT: 36.3 % (ref 36.0–46.0)
Hemoglobin: 12 g/dL (ref 12.0–15.0)
MCH: 28.8 pg (ref 26.0–34.0)
MCHC: 33.1 g/dL (ref 30.0–36.0)
MCV: 87.1 fL (ref 80.0–100.0)
Platelets: 267 10*3/uL (ref 150–400)
RBC: 4.17 MIL/uL (ref 3.87–5.11)
RDW: 14.6 % (ref 11.5–15.5)
WBC: 10.7 10*3/uL — ABNORMAL HIGH (ref 4.0–10.5)
nRBC: 0 % (ref 0.0–0.2)

## 2023-03-10 LAB — JAK2 V617F RFX CALR/MPL/E12-15

## 2023-03-10 LAB — APTT
aPTT: 92 seconds — ABNORMAL HIGH (ref 24–36)
aPTT: 85 seconds — ABNORMAL HIGH (ref 24–36)

## 2023-03-10 LAB — CALR +MPL + E12-E15  (REFLEX)

## 2023-03-10 LAB — HEPARIN LEVEL (UNFRACTIONATED): Heparin Unfractionated: >1.1 IU/mL — ABNORMAL HIGH (ref 0.30–0.70)

## 2023-03-10 MED ORDER — POTASSIUM CHLORIDE CRYS ER 20 MEQ PO TBCR
40.0000 meq | EXTENDED_RELEASE_TABLET | ORAL | Status: AC
  Administered 2023-03-10 (×3): 40 meq via ORAL
  Filled 2023-03-10 (×4): qty 2

## 2023-03-10 MED ORDER — APIXABAN 5 MG PO TABS
5.0000 mg | ORAL_TABLET | Freq: Two times a day (BID) | ORAL | Status: DC
  Administered 2023-03-16 – 2023-03-17 (×3): 5 mg via ORAL
  Filled 2023-03-10 (×3): qty 1

## 2023-03-10 MED ORDER — APIXABAN 5 MG PO TABS
10.0000 mg | ORAL_TABLET | Freq: Two times a day (BID) | ORAL | Status: AC
  Administered 2023-03-10 – 2023-03-15 (×12): 10 mg via ORAL
  Filled 2023-03-10 (×12): qty 2

## 2023-03-10 NOTE — Care Management Important Message (Signed)
Important Message  Patient Details  Name: Marilyn Andrews MRN: [id number] Date of Birth: Sep 25, 1936   Medicare Important Message Given:  N/A - LOS <3 / Initial given by admissions     [name] 03/10/2023, 9:04 AM

## 2023-03-10 NOTE — Progress Notes (Signed)
Progress Note    03/10/2023 2:11 PM 1 Day Post-Op  Subjective:  Marilyn Andrews is a 87 year old female with history of diverticulosis with diverticulitis, hypertension, GERD, generalized weakness, chronic pain low back and bilateral knee pain, chronic osteoarthritis, erythrocytosis, who presents to the emergency department from [facility] for chief concerns of intractable nausea and vomiting, left lower quadrant pain.   Patient is now postop day 1 from mechanical thrombectomy of bilateral pulmonary arteries with placement of an Elite IVC filter and right heart strain.  On exam this morning patient is resting comfortably in bed eating breakfast.  She is currently on 2 L of nasal cannula oxygen.  Endorses not having any pain at this time.  She does still endorse feeling weak without any nausea.  No complaints overnight vitals all remained stable   Vitals:   03/10/23 0912 03/10/23 1231  BP: (!) 138/54 (!) 107/52  Pulse: 87 84  Resp: (!) 24 20  Temp: 98.1 F (36.7 C) 98.1 F (36.7 C)  SpO2: 95% 95%   Physical Exam: Cardiac:  RRR, Normal S1, S2  Lungs:  Diminished throughout on auscultation with scattered Rhonchi Incisions:  Right groin with dressing clean dry and intact.  Extremities:  Positive palpable pulses throughout Abdomen:  Positive bowel sounds, soft, non tender and non distended Neurologic: A&O X 3; No focal weakness or paresthesias are detected; speech is fluent/normal   CBC    Component Value Date/Time   WBC 10.7 (H) 03/10/2023 0446   RBC 4.17 03/10/2023 0446   HGB 12.0 03/10/2023 0446   HGB 14.6 02/24/2023 1147   HGB 13.2 03/28/2013 0240   HCT 36.3 03/10/2023 0446   HCT 38.8 03/28/2013 0240   PLT 267 03/10/2023 0446   PLT 321 02/24/2023 1147   PLT 269 03/28/2013 0240   MCV 87.1 03/10/2023 0446   MCV 85 03/28/2013 0240   MCH 28.8 03/10/2023 0446   MCHC 33.1 03/10/2023 0446   RDW 14.6 03/10/2023 0446   RDW 13.3 03/28/2013 0240   LYMPHSABS 1.0 03/07/2023 0813    LYMPHSABS 1.1 03/28/2013 0240   MONOABS 0.5 03/07/2023 0813   MONOABS 0.8 03/28/2013 0240   EOSABS 0.2 03/07/2023 0813   EOSABS 0.0 03/28/2013 0240   BASOSABS 0.1 03/07/2023 0813   BASOSABS 0.0 03/28/2013 0240    BMET    Component Value Date/Time   NA 140 03/10/2023 0446   NA 143 03/31/2013 0438   K 2.8 (L) 03/10/2023 0446   K 3.7 03/31/2013 0438   CL 109 03/10/2023 0446   CL 109 (H) 03/31/2013 0438   CO2 24 03/10/2023 0446   CO2 28 03/31/2013 0438   GLUCOSE 86 03/10/2023 0446   GLUCOSE 109 (H) 03/31/2013 0438   BUN 7 (L) 03/10/2023 0446   BUN 4 (L) 03/31/2013 0438   CREATININE 0.76 03/10/2023 0446   CREATININE 1.04 (H) 02/24/2023 1148   CREATININE 0.95 03/31/2013 0438   CALCIUM 8.0 (L) 03/10/2023 0446   CALCIUM 8.0 (L) 03/31/2013 0438   GFRNONAA >60 03/10/2023 0446   GFRNONAA 53 (L) 02/24/2023 1148   GFRNONAA 59 (L) 03/31/2013 0438   GFRAA 52 (L) 03/07/2020 0457   GFRAA >60 03/31/2013 0438    INR    Component Value Date/Time   INR 1.1 02/09/2023 0613     Intake/Output Summary (Last 24 hours) at 03/10/2023 1411 Last data filed at 03/10/2023 0300 Gross per 24 hour  Intake 39.01 ml  Output 200 ml  Net -160.99 ml  Assessment/Plan:  87 y.o. female is s/p mechanical thrombectomy with IVC filter placement for saddle pulmonary embolism.  1 Day Post-Op   PLAN: Per vascular surgery patient may be taken off of heparin infusion and converted to Eliquis 10 mg twice a day for 7 days then converted to Eliquis 5 mg twice a day. Okay to discharge patient when hospitalist team feels appropriate. Pain control PT/OT   DVT prophylaxis:  Heparin Infusion   Marcie Bal [facility] 03/10/2023 2:11 PM

## 2023-03-10 NOTE — Progress Notes (Signed)
PT Cancellation Note  Patient Details Name: Rosabell Camm Lehtinen MRN: [id number] DOB: October 26, 1936   Cancelled Treatment:    Reason Eval/Treat Not Completed: Medical issues which prohibited therapy: Pt's most recent K was 2.8 with MD requesting PT/OT be held this date.  Will attempt to see pt at a future date/time as medically appropriate.    Ovidio Hanger PT, DPT 03/10/23, 2:29 PM

## 2023-03-10 NOTE — Progress Notes (Signed)
OT Cancellation Note  Patient Details Name: Jacie Vanwert Capraro MRN: [id number] DOB: 1936-06-25   Cancelled Treatment:    Reason Eval/Treat Not Completed: Medical issues which prohibited therapy. Pt s/p thrombectomy on 03/09/23. Pt's most recent K was 2.8 with MD requesting PT/OT be held this date. Will attempt to see pt at a future date/time as medically appropriate.   Dorene Grebe  [facility] 03/10/2023, 2:31 PM

## 2023-03-10 NOTE — Progress Notes (Signed)
Progress Note   Patient: Marilyn Andrews ZOX:[id number] DOB: 1936-03-26 DOA: 03/07/2023     3 DOS: the patient was seen and examined on 03/10/2023      Subjective:  Patient seen and examined at bedside this morning S/p thrombectomy by vascular surgeon done on 03/09/2023 Denies chest pain nausea vomiting abdominal pain She tells me her shortness of breath is much better today  Brief hospital course: [name] is a 87 year old female with history of diverticulosis with diverticulitis, hypertension, GERD, generalized weakness, chronic pain low back and bilateral knee pain, chronic osteoarthritis, erythrocytosis, who presents to the emergency department from [facility] for chief concerns of intractable nausea and vomiting, left lower quadrant pain. Also (+)dysuria.  05/05: in ED, VSS, mild hypokalemia, mild AKI, abn UA w/ 6-10 WBC and many bacteria w/ 5 ketones, Procal <0.01. CXR no concerns. CT Abd/Pelv (+)Severe sigmoid diverticulosis, no acute diverticulitis. Mild thickening of the sigmoid colonic wall, likely d/t diverticulosis, no perforation/abscess. (+)thrombus in the incompletely imaged right lower lobe pulmonary vessel concerning for pulmonary embolism. CT chest for further evaluation is suggested. DVT US LE (+)occlusive thrombus in the right femoral, popliteal, posterior tibial and peroneal veins. Given apixiban, potassium, 1L NS, nausea control. Admitted to hospitalist service. Follow BMP and plan CTA if renal fxn appropriate. 05/06: AKI resolved, CTA chest (+)saddle PE w/ RH strain. Vascular consulted - plan thrombectomy tomorrow. Heparin gtt started. Echo ordered.  05/07: thrombectomy today. Echo EF 60-65, no RWMA, mild LVH, G1DD, RV systolic fxn WNL, tricuspid regurg signal inadequate to assess PA pressure.        Consultants:  Vascular Surgery    Procedures: Thrombectomy done on 03/09/2023   ASSESSMENT & PLAN:   Principal Problem:   Intractable nausea and vomiting Active  Problems:   Acute pulmonary embolism (HCC)   HTN (hypertension)   GERD (gastroesophageal reflux disease)   Depression   Generalized weakness   Chronic pain   Erythrocytosis   Hypokalemia   CKD stage 3a, GFR 45-59 ml/min (HCC)   Pyuria   Restless leg     Acute submassive saddle pulmonary embolism (HCC) w/ RV strain  Acute occlusive thrombus in the right femoral, popliteal, posterior tibial and peroneal veins Continue Eliquis S/p thrombectomy by vascular surgeon done on 03/09/2023 Echo --> no concerns    Intractable nausea and vomiting Presumed secondary to acute PE Symptomatic support with ondansetron 4 mg p.o./IV as needed for nausea and vomiting; Phenergan 12.5 mg IV every 6 hours as needed for refractory nausea and vomiting, 18 hours of coverage ordered LR 150 mL/h, 1 day ordered --> d/c, restart IV fluids as needed  Protonix 40 mg IV nightly, 2 doses ordered   HTN (hypertension) Amlodipine 10 mg daily resumed Hydralazine 5 mg IV every 8 hours as needed for SBP greater 175, 4 days ordered  Hypokalemia Suspect secondary to GI loss Check magnesium level on admission --> WNL Replace as needed Monitor BMP Potassium of 2.8 on 03/10/2023   GERD (gastroesophageal reflux disease) Protonix 40 mg IV nightly, 2 days ordered in setting of intractable nausea and vomiting   CKD stage 3a, GFR 45-59 ml/min (HCC) At baseline, with mild increase in serum creatinine and decrease in EGFR compared to prior, does not meet criteria for acute kidney injury; presumed secondary to prerenal, from GI loss Follow BMP    Pyuria but pt noew denies dysuria ESBL likely colonization D/c abx    Restless leg Initiation of Requip 0.5 mg nightly, first dose  today, 2 doses ordered Iron panel      Depression Escitalopram 20 mg daily resumed Lorazepam 0.5 mg every 6 hours as needed for anxiety resumed   Chronic pain Home tizanidine 2 mg every 8 hours as needed for muscle spasms resumed Tramadol 50  mg twice daily as needed for moderate pain resumed   Generalized weakness Fall precautions, aspiration, PT, OT, TOC consulted       DVT prophylaxis: n/a, treating w/ Eliquis  Pertinent IV fluids/nutrition: stopped IV fluids, NPO at MN for thrombectomy tomorrow  Central lines / invasive devices: none   Code Status: DNR ACP documentation reviewed: 05/06, none on file    Current Admission Status: inpatient   TOC needs / Dispo plan: pending PT/OT Barriers to discharge / significant pending items: S post thrombectomy needs clearance by vascular surgeon before discharge   Family Communication: none at this time   Physical Exam: She is VERY hard of hearing  Constitutional:      General: She is not in acute distress. Cardiovascular:     Rate and Rhythm: Normal rate and regular rhythm.  Pulmonary:     Effort: Pulmonary effort is normal. No respiratory distress.     Breath sounds: Normal breath sounds.  Abdominal:     General: Abdomen is flat.     Palpations: Abdomen is soft.  Skin:    General: Skin is warm and dry.  Neurological:     Mental Status: She is alert and oriented to person, place, and time. Mental status is at baseline.    Vitals:   03/10/23 0410 03/10/23 0910 03/10/23 0912 03/10/23 1231  BP: 129/60 (!) 138/54 (!) 138/54 (!) 107/52  Pulse: 78  87 84  Resp: 17  (!) 24 20  Temp: 97.6 F (36.4 C)  98.1 F (36.7 C) 98.1 F (36.7 C)  TempSrc: Oral     SpO2: 92%  95% 95%  Weight:      Height:        Data Reviewed: Reviewed patient's laboratory results showing potassium of 2.8  Time spent: 45 minutes  Author: Loyce Dys, MD 03/10/2023 2:31 PM  For on call review [email or web address].

## 2023-03-10 NOTE — Consult Note (Signed)
ANTICOAGULATION CONSULT NOTE - Initial Consult  Pharmacy Consult for: Heparin  Indication: pulmonary embolus and DVT  Allergies  Allergen Reactions   Penicillins Other (See Comments)    Reaction as a child, patient does not recall  Has patient had a PCN reaction causing immediate rash, facial/tongue/throat swelling, SOB or lightheadedness with hypotension: unknown Has patient had a PCN reaction causing severe rash involving mucus membranes or skin necrosis: unknown Has patient had a PCN reaction that required hospitalization: unknown Has patient had a PCN reaction occurring within the last 10 years: No If all of the above answers are "NO", then may proceed with Cephalosporin use.   Sulfa Antibiotics Nausea Only    Patient Measurements: Height: 5\' 3"  (160 cm) Weight: 74.5 kg (164 lb 3.9 oz) IBW/kg (Calculated) : 52.4 Heparin Dosing Weight: 68.2 kg  Vital Signs: Temp: 97.6 F (36.4 C) (05/08 0410) Temp Source: Oral (05/08 0410) BP: 129/60 (05/08 0410) Pulse Rate: 78 (05/08 0410)  Labs: Recent Labs    03/07/23 0813 03/08/23 0428 03/08/23 2224 03/09/23 0519 03/09/23 0927 03/10/23 0446  HGB 14.9 13.0  --  13.9  --  12.0  HCT 44.6 40.2  --  41.4  --  36.3  PLT 258 245  --  289  --  267  APTT  --   --  123*  --  78* 92*  HEPARINUNFRC  --   --   --   --  >1.10* >1.10*  CREATININE 1.16* 0.79  --   --   --  0.76     Estimated Creatinine Clearance: 49.7 mL/min (by C-G formula based on SCr of 0.76 mg/dL).   Medical History: Past Medical History:  Diagnosis Date   Acute diverticulitis 03/07/2018   Anxiety    Arthritis    Depression    Diverticulitis    Dyspnea    Edema    FEET/LEGS   HOH (hard of hearing)    Hypertension    Hypokalemia 02/09/2023    Medications:  Scheduled:   amLODipine  10 mg Oral Daily   escitalopram  20 mg Oral Daily   feeding supplement  237 mL Oral BID BM   rosuvastatin  20 mg Oral QHS   senna-docusate  2 tablet Oral BID    Infusions:   heparin 850 Units/hr (03/09/23 2224)   promethazine (PHENERGAN) injection (IM or IVPB)     PRN: acetaminophen **OR** acetaminophen, fentaNYL (SUBLIMAZE) injection, hydrALAZINE, HYDROmorphone (DILAUDID) injection, LORazepam, ondansetron **OR** ondansetron (ZOFRAN) IV, ondansetron (ZOFRAN) IV, oxyCODONE, senna-docusate, tiZANidine, traMADol Anti-infectives (From admission, onward)    Start     Dose/Rate Route Frequency Ordered Stop   03/09/23 1100  nitrofurantoin (macrocrystal-monohydrate) (MACROBID) capsule 100 mg  Status:  Discontinued        100 mg Oral Every 12 hours 03/09/23 1005 03/09/23 1518   03/08/23 1531  vancomycin (VANCOCIN) IVPB 1000 mg/200 mL premix        1,000 mg 200 mL/hr over 60 Minutes Intravenous 60 min pre-op 03/08/23 1531 03/09/23 1736   03/07/23 1500  cefTRIAXone (ROCEPHIN) 1 g in sodium chloride 0.9 % 100 mL IVPB  Status:  Discontinued        1 g 200 mL/hr over 30 Minutes Intravenous Every 24 hours 03/07/23 1420 03/09/23 1005       Assessment: 87 yr female with PMH: diverticulosis and HTN presented to the ED with N/V. Pulmonary embolism found via CT Angiography Chest with Contrast. Patient started on apixaban (Eliquis) 10mg  twice daily yesterday (  5/5) x 7 days followed by apixaban (Eliquis) 5mg  twice daily for DVT/PE. This has been stopped due to plans for pulmonary thrombectomy tomorrow (5/7). Last dose apixaban was 5/6 08:33. Apixaban may falsely elevate heparin anti-Xa level temporarily and aPTT level will be used to monitor heparin.   Per CT Angiography Chest w/ Contrast:  Acute saddle pulmonary embolism with a moderate clot burden in both central pulmonary arteries extending to the segmental and subsegmental arteries, primarily involving the lower lobes. There is evidence of right heart strain with right to left heart ratio measuring 1.18, consistent with at least submassive (intermediate risk) PE. The presence of right heart strain has been  associated with an increased risk of morbidity and mortality.  Goal of Therapy:  aPTT 66-102 seconds Heparin level 0.3-0.7 once heparin level and aPTT is correlating.  Monitor platelets by anticoagulation protocol: Yes  Date Time aPTT/HL Rate/Comment 5/6 2224 123 s  Supratherapeutic 5/7 0927 78 s/>1.10 Therapeutic x 1; not correlating 5/8       0446    92 s/>1.10      Therapeutic X 2   Plan:  5/8 @ 0446:  aPTT = 92,   HL = > 1.10 - aPTT therapeutic X 2 , HL still elevated from Eliquis PTA - will continue pt on current rate and recheck aPTT and HL on 5/9 with AM labs.   Klay Sobotka D 03/10/2023 6:24 AM

## 2023-03-11 DIAGNOSIS — R112 Nausea with vomiting, unspecified: Secondary | ICD-10-CM | POA: Diagnosis not present

## 2023-03-11 LAB — CBC WITH DIFFERENTIAL/PLATELET
Abs Immature Granulocytes: 0.07 10*3/uL (ref 0.00–0.07)
Basophils Absolute: 0.1 10*3/uL (ref 0.0–0.1)
Basophils Relative: 1 %
Eosinophils Absolute: 0.7 10*3/uL — ABNORMAL HIGH (ref 0.0–0.5)
Eosinophils Relative: 7 %
HCT: 34.6 % — ABNORMAL LOW (ref 36.0–46.0)
Hemoglobin: 11.3 g/dL — ABNORMAL LOW (ref 12.0–15.0)
Immature Granulocytes: 1 %
Lymphocytes Relative: 16 %
Lymphs Abs: 1.4 10*3/uL (ref 0.7–4.0)
MCH: 28.4 pg (ref 26.0–34.0)
MCHC: 32.7 g/dL (ref 30.0–36.0)
MCV: 86.9 fL (ref 80.0–100.0)
Monocytes Absolute: 0.5 10*3/uL (ref 0.1–1.0)
Monocytes Relative: 6 %
Neutro Abs: 6.4 10*3/uL (ref 1.7–7.7)
Neutrophils Relative %: 69 %
Platelets: 262 10*3/uL (ref 150–400)
RBC: 3.98 MIL/uL (ref 3.87–5.11)
RDW: 14.6 % (ref 11.5–15.5)
WBC: 9.2 10*3/uL (ref 4.0–10.5)
nRBC: 0 % (ref 0.0–0.2)

## 2023-03-11 LAB — BASIC METABOLIC PANEL
Anion gap: 5 (ref 5–15)
BUN: 14 mg/dL (ref 8–23)
CO2: 24 mmol/L (ref 22–32)
Calcium: 8.5 mg/dL — ABNORMAL LOW (ref 8.9–10.3)
Chloride: 108 mmol/L (ref 98–111)
Creatinine, Ser: 0.89 mg/dL (ref 0.44–1.00)
GFR, Estimated: >60 mL/min (ref >60)
Glucose, Bld: 101 mg/dL — ABNORMAL HIGH (ref 70–99)
Potassium: 4.6 mmol/L (ref 3.5–5.1)
Sodium: 137 mmol/L (ref 135–145)

## 2023-03-11 MED ORDER — TRAMADOL HCL 50 MG PO TABS
50.0000 mg | ORAL_TABLET | Freq: Three times a day (TID) | ORAL | Status: DC
  Administered 2023-03-11 – 2023-03-17 (×16): 50 mg via ORAL
  Filled 2023-03-11 (×17): qty 1

## 2023-03-11 MED ORDER — ACETAMINOPHEN 500 MG PO TABS
1000.0000 mg | ORAL_TABLET | Freq: Two times a day (BID) | ORAL | Status: DC
  Administered 2023-03-11 – 2023-03-17 (×12): 1000 mg via ORAL
  Filled 2023-03-11 (×12): qty 2

## 2023-03-11 NOTE — Evaluation (Signed)
Occupational Therapy Evaluation Patient Details Name: Marilyn Andrews MRN: [id number] DOB: 1936-05-05 Today's Date: 03/11/2023   History of Present Illness Marilyn Andrews is a 87 y.o. female with medical history significant of prior episodes of diverticulitis.  Patient was in her usual state of health until about 5 days ago when she reports a new onset of constant aching pain in the left lower quadrant.   Clinical Impression   Pt was seen for OT evaluation this date. Prior to hospital admission, pt was at rehab and reports working with therapy but very weak. Pt presents to acute OT demonstrating impaired ADL performance and functional mobility 2/2 decreased strength, balance, activity tolerance, and pain (See OT problem list). Pt currently requires MIN A for STS transfers, CGA-MIN A for marching in place with RW and VC for preventing posterior LOB, and MIN-MOD A for LB ADL tasks. Pt would benefit from additional skilled OT services to address noted impairments and functional limitations (see below for any additional details) in order to maximize safety and independence while minimizing falls risk and caregiver burden.    Recommendations for follow up therapy are one component of a multi-disciplinary discharge planning process, led by the attending physician.  Recommendations may be updated based on patient status, additional functional criteria and insurance authorization.   Assistance Recommended at Discharge Frequent or constant Supervision/Assistance  Patient can return home with the following A lot of help with bathing/dressing/bathroom;Assistance with cooking/housework;Assist for transportation;Help with stairs or ramp for entrance;A little help with walking and/or transfers    Functional Status Assessment  Patient has had a recent decline in their functional status and demonstrates the ability to make significant improvements in function in a reasonable and predictable amount of time.  Equipment  Recommendations  Other (comment) (defer)    Recommendations for Other Services       Precautions / Restrictions Precautions Precautions: Fall Restrictions Weight Bearing Restrictions: No      Mobility Bed Mobility               General bed mobility comments: NT up in recliner    Transfers Overall transfer level: Needs assistance   Transfers: Sit to/from Stand Sit to Stand: Min assist           General transfer comment: VC hand placement      Balance Overall balance assessment: Needs assistance Sitting-balance support: No upper extremity supported, Feet supported Sitting balance-Leahy Scale: Fair   Postural control: Posterior lean Standing balance support: Reliant on assistive device for balance, During functional activity, Bilateral upper extremity supported Standing balance-Leahy Scale: Poor                             ADL either performed or assessed with clinical judgement   ADL Overall ADL's : Needs assistance/impaired     Grooming: Sitting;Set up;Wash/dry hands;Oral care;Wash/dry face               Lower Body Dressing: Sitting/lateral leans;Minimal assistance Lower Body Dressing Details (indicate cue type and reason): MIN A to don socks using figure 4, able to don R, requiring assist for initiating over L foot                     Vision         Perception     Praxis      Pertinent Vitals/Pain Pain Assessment Pain Assessment: 0-10 Pain Score: 9  Pain Location:  neck Pain Descriptors / Indicators: Aching, Sharp, Guarding Pain Intervention(s): Limited activity within patient's tolerance, Monitored during session, Repositioned, Patient requesting pain meds-RN notified, RN gave pain meds during session     Hand Dominance     Extremity/Trunk Assessment Upper Extremity Assessment Upper Extremity Assessment: Generalized weakness   Lower Extremity Assessment Lower Extremity Assessment: Generalized weakness        Communication Communication Communication: (P) HOH   Cognition Arousal/Alertness: Awake/alert Behavior During Therapy: WFL for tasks assessed/performed Overall Cognitive Status: Within Functional Limits for tasks assessed                                       General Comments  HR 108, SpO2 92% on room air after marching    Exercises Other Exercises Other Exercises: Pt educated in AE for LB ADL, ADL transfers, balance with RW with weight shifting to decrease posterior lean/LOB   Shoulder Instructions      Home Living Family/patient expects to be discharged to:: (P) Private residence Living Arrangements: (P) Alone Available Help at Discharge: (P) Family;Available PRN/intermittently Type of Home: (P) House Home Access: (P) Stairs to enter Entrance Stairs-Number of Steps: (P) 4 Entrance Stairs-Rails: (P) Right;Left;Can reach both Home Layout: (P) One level     Bathroom Shower/Tub: (P) Tub/shower unit         Home Equipment: (P) Rolling Walker (2 wheels);Cane - single point          Prior Functioning/Environment Prior Level of Function : (P) Needs assist             Mobility Comments: (P) uses RW household distances. ADLs Comments: (P) son and DIL assist with IADL's, meals on wheels. Pt (I) ADLs.        OT Problem List: Decreased strength;Decreased activity tolerance;Impaired balance (sitting and/or standing);Decreased safety awareness;Pain;Decreased knowledge of use of DME or AE;Impaired UE functional use      OT Treatment/Interventions: Self-care/ADL training;Therapeutic exercise;Therapeutic activities;DME and/or AE instruction;Energy conservation;Patient/family education;Balance training    OT Goals(Current goals can be found in the care plan section) Acute Rehab OT Goals Patient Stated Goal: have less pain OT Goal Formulation: With patient Time For Goal Achievement: 03/25/23 Potential to Achieve Goals: Fair ADL Goals Pt Will Perform  Lower Body Dressing: with min guard assist;sit to/from stand;with adaptive equipment Pt Will Transfer to Toilet: with supervision;ambulating;bedside commode (LRAD) Pt Will Perform Toileting - Clothing Manipulation and hygiene: with modified independence Additional ADL Goal #1: Pt will verbalize plan to implement at least 1 learned falls prevention strategy to minimize falls risk.  OT Frequency: Min 2X/week    Co-evaluation              AM-PAC OT "6 Clicks" Daily Activity     Outcome Measure Help from another person eating meals?: None Help from another person taking care of personal grooming?: A Little Help from another person toileting, which includes using toliet, bedpan, or urinal?: A Lot Help from another person bathing (including washing, rinsing, drying)?: A Lot Help from another person to put on and taking off regular upper body clothing?: A Little Help from another person to put on and taking off regular lower body clothing?: A Lot 6 Click Score: 16   End of Session Equipment Utilized During Treatment: Gait belt;Rolling walker (2 wheels) Nurse Communication: Patient requests pain meds;Mobility status  Activity Tolerance: Patient tolerated treatment well Patient left: in chair;with call  bell/phone within reach;with chair alarm set  OT Visit Diagnosis: Muscle weakness (generalized) (M62.81)                Time: 1014-1050 OT Time Calculation (min): 36 min Charges:  OT General Charges $OT Visit: 1 Visit OT Evaluation $OT Eval Moderate Complexity: 1 Mod OT Treatments $Self Care/Home Management : 8-22 mins  Arman Filter., MPH, MS, OTR/L ascom [phone] 03/11/23, 1:09 PM

## 2023-03-11 NOTE — TOC Progression Note (Signed)
Transition of Care [facility]) - Progression Note    Patient Details  Name: Marilyn Andrews MRN: [id number] Date of Birth: 05-Jan-1936  Transition of Care [facility] CM/SW Contact  Truddie Hidden, RN Phone Number: 03/11/2023, 1:17 AM  Clinical Narrative:    Spoke with patient regarding discharge plan. Patient does not wish to return to [facility]. She does not have a preference of facilities. She would like to remain in [facility].        Expected Discharge Plan and Services                                               Social Determinants of Health (SDOH) Interventions SDOH Screenings   Food Insecurity: No Food Insecurity (03/07/2023)  Housing: Low Risk  (03/07/2023)  Transportation Needs: No Transportation Needs (03/07/2023)  Utilities: Not At Risk (03/07/2023)  Tobacco Use: Low Risk  (03/10/2023)    Readmission Risk Interventions     No data to display

## 2023-03-11 NOTE — Evaluation (Signed)
Physical Therapy Evaluation Patient Details Name: Marilyn Andrews MRN: [id number] DOB: Nov 11, 1935 Today's Date: 03/11/2023  History of Present Illness  Marilyn Andrews is a 87 y.o. female with medical history significant of prior episodes of diverticulitis. Arrives from STR with nausea/vomiting/pain in the left lower quadrant.  While here found to have had PE and is s/p thrombectomy 5/7.  Clinical Impression  Pt willing to work with PT but consistently discussing how weak she feels and needing to do all activities on her terms.  She did not need a lot of assist with bed mobility and getting to standing but showed poor activity tolerance and ability to maintain standing safely/effectively.  She was able to take a few steps bed to [facility] relatively well, but on standing and attempting to do some walking (plan to get to recliner) she had a significant posterior LOB needing maxA to insure she made it safely to the bed.  After resetting and further cuing for safety, weight shift, walker use and positioning she was able to again rise and take a few steps with CGA/minA to the recliner.  Pt will benefit from continued PT to address functional limitations and work back to PLOF.       Recommendations for follow up therapy are one component of a multi-disciplinary discharge planning process, led by the attending physician.  Recommendations may be updated based on patient status, additional functional criteria and insurance authorization.  Follow Up Recommendations Can patient physically be transported by private vehicle: No     Assistance Recommended at Discharge Intermittent Supervision/Assistance  Patient can return home with the following  Help with stairs or ramp for entrance;Assist for transportation;A little help with walking and/or transfers;A little help with bathing/dressing/bathroom;Assistance with cooking/housework    Equipment Recommendations None recommended by PT  Recommendations for Other Services        Functional Status Assessment Patient has had a recent decline in their functional status and demonstrates the ability to make significant improvements in function in a reasonable and predictable amount of time.     Precautions / Restrictions Precautions Precautions: Fall Restrictions Weight Bearing Restrictions: No      Mobility  Bed Mobility Overal bed mobility: Needs Assistance Bed Mobility: Supine to Sit     Supine to sit: Min assist     General bed mobility comments: Pt needed rail use and light assist with LEs/trunk to get to sitting EOB    Transfers Overall transfer level: Needs assistance Equipment used: Rolling walker (2 wheels) Transfers: Sit to/from Stand, Bed to chair/wheelchair/BSC Sit to Stand: Min assist Stand pivot transfers: Min assist         General transfer comment: Pt was able to initiate upward movement but needed assist to maintain forward/upward momentum to actually attain standing in walker    Ambulation/Gait Ambulation/Gait assistance: Min assist, Max assist Gait Distance (Feet): 5 Feet Assistive device: Rolling walker (2 wheels)         General Gait Details: Pt with limited actual ambulate due to LE weakness and general instability.  She was able to take a few small steps to/from the [facility] but did have one significant backward LOB needing maxA to insure she landed on the bed.  After readjustment she was able to rise and take a few more steps to get bed to recliner but continuously vocalizing how weak her LEs are.  Heavy UE use on walker and minA with standing/weight shifting efforts.  Stairs  Wheelchair Mobility    Modified Rankin (Stroke Patients Only)       Balance Overall balance assessment: Needs assistance Sitting-balance support: No upper extremity supported, Feet supported Sitting balance-Leahy Scale: Good   Postural control: Posterior lean Standing balance support: Reliant on assistive device for balance,  During functional activity, Bilateral upper extremity supported Standing balance-Leahy Scale: Poor Standing balance comment: Pt struggled to consistently keep weight forward on walker (could make transient adjustments with cuing) needing constant CGA to minA to insure weight stays forward - ultimately needing heavy UE use and plenty of cuing for standing acts.  One significant retro LOB in standing needing maxA to safely get to bed.                             Pertinent Vitals/Pain Pain Assessment Pain Assessment: Faces Faces Pain Scale: Hurts little more Pain Location:  (pt reports "all over"general pain complaints)    Home Living Family/patient expects to be discharged to:: Skilled nursing facility Living Arrangements: Alone Available Help at Discharge: (P) Family;Available PRN/intermittently Type of Home: House Home Access: Stairs to enter Entrance Stairs-Rails: Right;Left;Can reach both Entrance Stairs-Number of Steps: 4   Home Layout: One level Home Equipment: Agricultural consultant (2 wheels);Cane - single point      Prior Function Prior Level of Function : Needs assist             Mobility Comments: Pt reports that she is normally mobile in the home, has been weak and limited with ambulation since recent hosptialization last month. ADLs Comments: son and DIL assist with IADL's, meals on wheels. Pt (I) ADLs.     Hand Dominance        Extremity/Trunk Assessment   Upper Extremity Assessment Upper Extremity Assessment: Generalized weakness    Lower Extremity Assessment Lower Extremity Assessment: Generalized weakness       Communication   Communication: HOH  Cognition Arousal/Alertness: Awake/alert Behavior During Therapy: WFL for tasks assessed/performed Overall Cognitive Status: Within Functional Limits for tasks assessed                                 General Comments: Pt willing to participate but did not seem particularly excited  about it        General Comments General comments (skin integrity, edema, etc.): Pt on 2L O2 on arrival with SpO2 in the high 90s, room air t/o activity with sats staying in the mid 90s no DOE    Exercises     Assessment/Plan    PT Assessment Patient needs continued PT services  PT Problem List Decreased strength;Pain;Decreased activity tolerance;Decreased balance;Decreased mobility       PT Treatment Interventions DME instruction;Therapeutic exercise;Balance training;Gait training;Neuromuscular re-education;Functional mobility training;Therapeutic activities;Patient/family education;Stair training    PT Goals (Current goals can be found in the Care Plan section)  Acute Rehab PT Goals Patient Stated Goal: get stronger and go home to take care of her dog PT Goal Formulation: With patient Time For Goal Achievement: 03/24/23 Potential to Achieve Goals: Fair    Frequency Min 2X/week     Co-evaluation               AM-PAC PT "6 Clicks" Mobility  Outcome Measure Help needed turning from your back to your side while in a flat bed without using bedrails?: None Help needed moving from lying on your back to  sitting on the side of a flat bed without using bedrails?: None Help needed moving to and from a bed to a chair (including a wheelchair)?: A Lot Help needed standing up from a chair using your arms (e.g., wheelchair or bedside chair)?: A Little Help needed to walk in hospital room?: A Lot Help needed climbing 3-5 steps with a railing? : Total 6 Click Score: 16    End of Session   Activity Tolerance: Patient limited by fatigue Patient left: in chair;with chair alarm set Nurse Communication: Mobility status PT Visit Diagnosis: Muscle weakness (generalized) (M62.81);Other abnormalities of gait and mobility (R26.89);Difficulty in walking, not elsewhere classified (R26.2)    Time: 1610-9604 PT Time Calculation (min) (ACUTE ONLY): 31 min   Charges:   PT Evaluation $PT  Eval Low Complexity: 1 Low PT Treatments $Therapeutic Activity: 8-22 mins        Malachi Pro, DPT 03/11/2023, 2:16 PM

## 2023-03-11 NOTE — Progress Notes (Signed)
Progress Note   Patient: Marilyn Andrews ZOX:[id number] DOB: 10/30/1936 DOA: 03/07/2023     4 DOS: the patient was seen and examined on 03/11/2023     Subjective:  Patient seen and examined at bedside this morning S/p thrombectomy by vascular surgeon done on 03/09/2023 Discussed with vascular surgeon as well as cardiologist According to vascular surgeon case is suspicious for ASD and subsequently cardiologist was consulted who is planning echo with bubble study Denies chest pain nausea vomiting abdominal pain She tells me her shortness of breath is much better today   Brief hospital course: Marilyn Andrews is a 87 year old female with history of diverticulosis with diverticulitis, hypertension, GERD, generalized weakness, chronic pain low back and bilateral knee pain, chronic osteoarthritis, erythrocytosis, who presents [facility] the emergency [facility] for chief concerns of intractable nausea and vomiting, left lower quadrant pain. Also (+)dysuria.  05/05: in ED, VSS, mild hypokalemia, mild AKI, abn UA w/ 6-10 WBC and many bacteria w/ 5 ketones, Procal <0.01. CXR no concerns. CT Abd/Pelv (+)Severe sigmoid diverticulosis, no acute diverticulitis. Mild thickening of the sigmoid colonic wall, likely d/t diverticulosis, no perforation/abscess. (+)thrombus in the incompletely imaged right lower lobe pulmonary vessel concerning for pulmonary embolism. CT chest for further evaluation is suggested. DVT US LE (+)occlusive thrombus in the right femoral, popliteal, posterior tibial and peroneal veins. Given apixiban, potassium, 1L NS, nausea control. Admitted to hospitalist service. Follow BMP and plan CTA if renal fxn appropriate. 05/06: AKI resolved, CTA chest (+)saddle PE w/ RH strain. Vascular consulted - plan thrombectomy tomorrow. Heparin gtt started. Echo ordered.  05/07: thrombectomy today. Echo EF 60-65, no RWMA, mild LVH, G1DD, RV systolic fxn WNL, tricuspid regurg signal inadequate to assess  PA pressure.        Consultants:  Vascular Surgery    Procedures: Thrombectomy done on 03/09/2023   ASSESSMENT & PLAN:   Principal Problem:   Intractable nausea and vomiting Active Problems:   Acute pulmonary embolism (HCC)   HTN (hypertension)   GERD (gastroesophageal reflux disease)   Depression   Generalized weakness   Chronic pain   Erythrocytosis   Hypokalemia   CKD stage 3a, GFR 45-59 ml/min (HCC)   Pyuria   Restless leg     Acute submassive saddle pulmonary embolism (HCC) w/ RV strain  Acute occlusive thrombus in the right femoral, popliteal, posterior tibial and peroneal veins Continue Eliquis S/p thrombectomy by vascular surgeon done on 03/09/2023    Possible ASD or PFO S/p thrombectomy by vascular surgeon done on 03/09/2023 Discussed with vascular surgeon as well as cardiologist According to vascular surgeon case is suspicious for ASD and subsequently cardiologist was consulted who is planning echo with bubble study  Intractable nausea and vomiting Presumed secondary to acute PE Symptomatic support with ondansetron 4 mg p.o./IV as needed for nausea and vomiting; Phenergan 12.5 mg IV every 6 hours as needed for refractory nausea and vomiting, 18 hours of coverage ordered LR 150 mL/h, 1 day ordered --> d/c, restart IV fluids as needed  Protonix 40 mg   HTN (hypertension) Amlodipine 10 mg daily resumed Hydralazine 5 mg IV every 8 hours as needed for SBP greater 175, 4 days ordered   Hypokalemia Suspect secondary to GI loss Check magnesium level on admission --> WNL Replace as needed Monitor BMP Potassium of 2.8 on 03/10/2023    GERD (gastroesophageal reflux disease) Protonix 40 mg IV nightly, 2 days ordered in setting of intractable nausea and vomiting   CKD stage  3a, GFR 45-59 ml/min (HCC) At baseline, with mild increase in serum creatinine and decrease in EGFR compared to prior, does not meet criteria for acute kidney injury; presumed secondary to  prerenal, from GI loss Follow BMP    Pyuria but pt noew denies dysuria ESBL likely colonization D/c abx    Restless leg Initiation of Requip 0.5 mg nightly, first dose today, 2 doses ordered Iron panel       Depression Escitalopram 20 mg daily resumed Lorazepam 0.5 mg every 6 hours as needed for anxiety resumed   Chronic pain Home tizanidine 2 mg every 8 hours as needed for muscle spasms resumed Tramadol 50 mg twice daily as needed for moderate pain resumed   Generalized weakness Fall precautions, aspiration, PT, OT, TOC consulted       DVT prophylaxis: n/a, treating w/ Eliquis   Central lines / invasive devices: none   Code Status: DNR ACP documentation reviewed: 05/06, none on file    Current Admission Status: inpatient    Barriers to discharge / significant pending items: S post thrombectomy needs clearance by vascular surgeon and cardiologist before discharge    Family Communication: none at this time   Laboratory data reviewed: I personally reviewed patient's laboratory data today showing WBC 9.2 hemoglobin 11.3 sodium 137 potassium 4.6  Physical Exam: She is VERY hard of hearing  Constitutional:      General: She is not in acute distress. Cardiovascular:     Rate and Rhythm: Normal rate and regular rhythm.  Pulmonary:     Effort: Pulmonary effort is normal. No respiratory distress.     Breath sounds: Normal breath sounds.  Abdominal:     General: Abdomen is flat.     Palpations: Abdomen is soft.  Skin:    General: Skin is warm and dry.  Neurological:     Mental Status: She is alert and oriented to person, place, and time. Mental status is at baseline.     I spent a total of 50 minutes discussing the plan of care with cardiologist, vascular surgeon, patient, nursing staff as well as taking care of this patient today  Vitals:   03/11/23 0746 03/11/23 1201 03/11/23 1211 03/11/23 1227  BP: (!) 142/61 (!) 120/53 116/65 105/65  Pulse: 66 92 97 (!) 114   Resp: 15 15    Temp: 97.7 F (36.5 C) 98.3 F (36.8 C)    TempSrc: Oral     SpO2: 97% 96%  98%  Weight:      Height:         Author: Loyce Dys, MD 03/11/2023 3:13 PM  For on call review [email or web address].

## 2023-03-12 ENCOUNTER — Inpatient Hospital Stay (HOSPITAL_COMMUNITY)
Admit: 2023-03-12 | Discharge: 2023-03-12 | Disposition: A | Discharge status: Home / Self Care | Payer: Medicare HMO | Attending: Cardiovascular Disease | Admitting: Cardiovascular Disease

## 2023-03-12 DIAGNOSIS — R112 Nausea with vomiting, unspecified: Secondary | ICD-10-CM | POA: Diagnosis not present

## 2023-03-12 DIAGNOSIS — I2609 Other pulmonary embolism with acute cor pulmonale: Secondary | ICD-10-CM | POA: Diagnosis not present

## 2023-03-12 LAB — BASIC METABOLIC PANEL
Anion gap: 5 (ref 5–15)
BUN: 13 mg/dL (ref 8–23)
CO2: 24 mmol/L (ref 22–32)
Calcium: 8.7 mg/dL — ABNORMAL LOW (ref 8.9–10.3)
Chloride: 109 mmol/L (ref 98–111)
Creatinine, Ser: 0.94 mg/dL (ref 0.44–1.00)
GFR, Estimated: 59 mL/min — ABNORMAL LOW (ref >60)
Glucose, Bld: 102 mg/dL — ABNORMAL HIGH (ref 70–99)
Potassium: 3.9 mmol/L (ref 3.5–5.1)
Sodium: 138 mmol/L (ref 135–145)

## 2023-03-12 LAB — CBC WITH DIFFERENTIAL/PLATELET
Abs Immature Granulocytes: 0.08 10*3/uL — ABNORMAL HIGH (ref 0.00–0.07)
Basophils Absolute: 0.1 10*3/uL (ref 0.0–0.1)
Basophils Relative: 1 %
Eosinophils Absolute: 0.5 10*3/uL (ref 0.0–0.5)
Eosinophils Relative: 6 %
HCT: 34.6 % — ABNORMAL LOW (ref 36.0–46.0)
Hemoglobin: 11.7 g/dL — ABNORMAL LOW (ref 12.0–15.0)
Immature Granulocytes: 1 %
Lymphocytes Relative: 19 %
Lymphs Abs: 1.7 10*3/uL (ref 0.7–4.0)
MCH: 28.7 pg (ref 26.0–34.0)
MCHC: 33.8 g/dL (ref 30.0–36.0)
MCV: 85 fL (ref 80.0–100.0)
Monocytes Absolute: 0.5 10*3/uL (ref 0.1–1.0)
Monocytes Relative: 6 %
Neutro Abs: 6 10*3/uL (ref 1.7–7.7)
Neutrophils Relative %: 67 %
Platelets: 293 10*3/uL (ref 150–400)
RBC: 4.07 MIL/uL (ref 3.87–5.11)
RDW: 14.6 % (ref 11.5–15.5)
WBC: 8.8 10*3/uL (ref 4.0–10.5)
nRBC: 0 % (ref 0.0–0.2)

## 2023-03-12 LAB — ECHOCARDIOGRAM LIMITED
Height: 63 in
S' Lateral: 2.7 cm
Weight: 2627.88 oz

## 2023-03-12 NOTE — NC FL2 (Signed)
[place] MEDICAID FL2 LEVEL OF CARE FORM     IDENTIFICATION  Patient Name: Marilyn Andrews Birthdate: [birth year] Sex: female Admission Date (Current Location): 03/07/2023  [facility] and [place] Number:  Chiropodist and Address:  [facility], [address] [place], [place] [address]      Provider Number: [id number]  Attending Physician Name and Address:  Loyce Dys, MD  Relative Name and Phone Number:  Nasheka, Njie) [phone] [facility])    Current Level of Care: Hospital Recommended Level of Care: Skilled Nursing Facility Prior Approval Number:    Date Approved/Denied:   PASRR Number: [id number]  Discharge Plan: SNF    Current Diagnoses: Patient Active Problem List   Diagnosis Date Noted   Intractable nausea and vomiting 03/07/2023   Acute pulmonary embolism (HCC) 03/07/2023   CKD stage 3a, GFR 45-59 ml/min (HCC) 03/07/2023   Pyuria 03/07/2023   Restless leg 03/07/2023   Left upper extremity swelling 02/18/2023   Hypokalemia 02/09/2023   Chronic pain 02/08/2023   Erythrocytosis 02/08/2023   Generalized weakness 09/17/2020   GERD (gastroesophageal reflux disease) 09/09/2020   Depression 09/09/2020   HTN (hypertension) 03/05/2020   UTI (urinary tract infection) 07/28/2018   Severe recurrent major depression without psychotic features (HCC) 03/08/2018   Diverticulitis 07/01/2016    Orientation RESPIRATION BLADDER Height & Weight     Self, Time, Situation, Place  Normal Incontinent Weight: 74.5 kg Height:  5\' 3"  (160 cm)  BEHAVIORAL SYMPTOMS/MOOD NEUROLOGICAL BOWEL NUTRITION STATUS  Other (Comment) (n/a)  (n/a) Continent Diet (Heart)  AMBULATORY STATUS COMMUNICATION OF NEEDS Skin   Limited Assist Verbally Bruising (bilateral arms)                       Personal Care Assistance Level of Assistance  Bathing, Dressing Bathing Assistance: Limited assistance Feeding assistance: Independent Dressing Assistance:  Limited assistance     Functional Limitations Info  Sight, Hearing Sight Info: Impaired Hearing Info: Impaired      SPECIAL CARE FACTORS FREQUENCY  PT (By licensed PT), OT (By licensed OT)     PT Frequency: Min 2x weekly OT Frequency: Min 2x weekly            Contractures Contractures Info: Not present    Additional Factors Info  Code Status, Allergies Code Status Info: DNR Allergies Info: Penicillins, Sulfa Antibiotics           Current Medications (03/12/2023):  This is the current hospital active medication list Current Facility-Administered Medications  Medication Dose Route Frequency Provider Last Rate Last Admin   acetaminophen (TYLENOL) tablet 1,000 mg  1,000 mg Oral BID Rosezetta Schlatter T, MD   1,000 mg at 03/12/23 0939   amLODipine (NORVASC) tablet 10 mg  10 mg Oral Daily Schnier, Latina Craver, MD   10 mg at 03/12/23 6440   apixaban (ELIQUIS) tablet 10 mg  10 mg Oral BID Renford Dills, MD   10 mg at 03/12/23 3474   Followed by   Melene Muller ON 03/16/2023] apixaban (ELIQUIS) tablet 5 mg  5 mg Oral BID Schnier, Latina Craver, MD       escitalopram (LEXAPRO) tablet 20 mg  20 mg Oral Daily Schnier, Latina Craver, MD   20 mg at 03/12/23 0939   feeding supplement (ENSURE ENLIVE / ENSURE PLUS) liquid 237 mL  237 mL Oral BID BM Schnier, Latina Craver, MD   237 mL at 03/12/23 0939   fentaNYL (SUBLIMAZE) injection 12.5 mcg  12.5 mcg Intravenous Once PRN Schnier, Latina Craver, MD       HYDROmorphone (DILAUDID) injection 1 mg  1 mg Intravenous Once PRN Schnier, Latina Craver, MD       LORazepam (ATIVAN) tablet 0.5 mg  0.5 mg Oral Q6H PRN Schnier, Latina Craver, MD   0.5 mg at 03/12/23 0535   ondansetron (ZOFRAN) tablet 4 mg  4 mg Oral Q6H PRN Schnier, Latina Craver, MD   4 mg at 03/11/23 1827   Or   ondansetron (ZOFRAN) injection 4 mg  4 mg Intravenous Q6H PRN Schnier, Latina Craver, MD   4 mg at 03/11/23 0524   ondansetron (ZOFRAN) injection 4 mg  4 mg Intravenous Q6H PRN Schnier, Latina Craver, MD       oxyCODONE  (Oxy IR/ROXICODONE) immediate release tablet 5 mg  5 mg Oral Q6H PRN Schnier, Latina Craver, MD   5 mg at 03/11/23 2145   promethazine (PHENERGAN) 12.5 mg in sodium chloride 0.9 % 50 mL IVPB  12.5 mg Intravenous Once Foust, Katy L, NP       rosuvastatin (CRESTOR) tablet 20 mg  20 mg Oral QHS Schnier, Latina Craver, MD   20 mg at 03/11/23 2145   senna-docusate (Senokot-S) tablet 1 tablet  1 tablet Oral QHS PRN Schnier, Latina Craver, MD       senna-docusate (Senokot-S) tablet 2 tablet  2 tablet Oral BID Schnier, Latina Craver, MD   2 tablet at 03/12/23 4098   tiZANidine (ZANAFLEX) tablet 2 mg  2 mg Oral Q8H PRN Schnier, Latina Craver, MD   2 mg at 03/11/23 2145   traMADol (ULTRAM) tablet 50 mg  50 mg Oral Q8H Loyce Dys, MD   50 mg at 03/12/23 0535     Discharge Medications: Please see discharge summary for a list of discharge medications.  Relevant Imaging Results:  Relevant Lab Results:   Additional Information SS# [id number]  Truddie Hidden, RN

## 2023-03-12 NOTE — Progress Notes (Signed)
Progress Note   Patient: Marilyn Andrews NFA:[id number] DOB: 10-Oct-1936 DOA: 03/07/2023     5 DOS: the patient was seen and examined on 03/12/2023    Subjective:  Patient seen and examined at bedside this morning She tells me she is feeling generally weak She is working with PT OT who have recommended skilled nursing facility/short-term rehab Denies chest pain nausea vomiting abdominal pain    Brief hospital course: [name] is a 87 year old female with history of diverticulosis with diverticulitis, hypertension, GERD, generalized weakness, chronic pain low back and bilateral knee pain, chronic osteoarthritis, erythrocytosis, who presents to the emergency department from [facility] for chief concerns of intractable nausea and vomiting, left lower quadrant pain. Also (+)dysuria.  05/05: in ED, VSS, mild hypokalemia, mild AKI, abn UA w/ 6-10 WBC and many bacteria w/ 5 ketones, Procal <0.01. CXR no concerns. CT Abd/Pelv (+)Severe sigmoid diverticulosis, no acute diverticulitis. Mild thickening of the sigmoid colonic wall, likely d/t diverticulosis, no perforation/abscess. (+)thrombus in the incompletely imaged right lower lobe pulmonary vessel concerning for pulmonary embolism. CT chest for further evaluation is suggested. DVT US LE (+)occlusive thrombus in the right femoral, popliteal, posterior tibial and peroneal veins. Given apixiban, potassium, 1L NS, nausea control. Admitted to hospitalist service. Follow BMP and plan CTA if renal fxn appropriate. 05/06: AKI resolved, CTA chest (+)saddle PE w/ RH strain. Vascular consulted - plan thrombectomy tomorrow. Heparin gtt started. Echo ordered.  05/07: thrombectomy today. Echo EF 60-65, no RWMA, mild LVH, G1DD, RV systolic fxn WNL, tricuspid regurg signal inadequate to assess PA pressure.        Consultants:  Vascular Surgery    Procedures: Thrombectomy done on 03/09/2023   ASSESSMENT & PLAN:   Principal Problem:   Intractable nausea and  vomiting Active Problems:   Acute pulmonary embolism (HCC)   HTN (hypertension)   GERD (gastroesophageal reflux disease)   Depression   Generalized weakness   Chronic pain   Erythrocytosis   Hypokalemia   CKD stage 3a, GFR 45-59 ml/min (HCC)   Pyuria   Restless leg     Acute submassive saddle pulmonary embolism (HCC) w/ RV strain  Acute occlusive thrombus in the right femoral, popliteal, posterior tibial and peroneal veins Continue Eliquis S/p thrombectomy by vascular surgeon done on 03/09/2023     Possible ASD or PFO ruled out S/p thrombectomy by vascular surgeon done on 03/09/2023 Discussed with vascular surgeon as well as cardiologist There was suspicion for ASD/PFO however after bubble study done by cardiologist today this was ruled out Given the findings on CT angio cardiologist has recommended continuation of Eliquis   Intractable nausea and vomiting Presumed secondary to acute PE Symptomatic support with ondansetron 4 mg p.o./IV as needed for nausea and vomiting; Phenergan 12.5 mg IV every 6 hours as needed for refractory nausea and vomiting, 18 hours of coverage ordered Protonix 40 mg   HTN (hypertension) Amlodipine 10 mg daily resumed Hydralazine 5 mg IV every 8 hours as needed for SBP greater 175, 4 days ordered   Hypokalemia-improved Suspect secondary to GI loss Check magnesium level on admission --> WNL Replace as needed Monitor BMP Potassium of 2.8 on 03/10/2023    GERD (gastroesophageal reflux disease) Protonix 40 mg IV nightly, 2 days ordered in setting of intractable nausea and vomiting   CKD stage 3a, GFR 45-59 ml/min (HCC) At baseline, with mild increase in serum creatinine and decrease in EGFR compared to prior, does not meet criteria for acute kidney injury; presumed  secondary to prerenal, from GI loss Follow BMP    Pyuria but pt noew denies dysuria ESBL likely colonization D/c abx    Restless leg Continue Requip 0.5 mg nightly     Depression Escitalopram 20 mg daily resumed Lorazepam 0.5 mg every 6 hours as needed for anxiety resumed   Chronic pain Home tizanidine 2 mg every 8 hours as needed for muscle spasms resumed Tramadol 50 mg twice daily as needed for moderate pain resumed   Generalized weakness Fall precautions, aspiration, PT, OT, TOC consulted       DVT prophylaxis: n/a, treating w/ Eliquis    Central lines / invasive devices: none   Code Status: DNR   Current Admission Status: inpatient     Barriers to discharge / significant pending items: S post thrombectomy needs clearance by vascular surgeon and cardiologist before discharge    Family Communication: none at this time   Laboratory data reviewed: I personally reviewed patient's laboratory data today showing sodium 138 potassium 3.9 WBC 8.8 hemoglobin 11.7   Physical Exam: She is VERY hard of hearing  Constitutional:      General: She is not in acute distress. Cardiovascular:     Rate and Rhythm: Normal rate and regular rhythm.  Pulmonary:     Effort: Pulmonary effort is normal. No respiratory distress.     Breath sounds: Normal breath sounds.  Abdominal:     General: Abdomen is flat.     Palpations: Abdomen is soft.  Skin:    General: Skin is warm and dry.  Neurological:     Mental Status: She is alert and oriented to person, place, and time. Mental status is at baseline.      I spent a total of 45 minutes discussing the plan of care with cardiologist, vascular surgeon, patient, nursing staff as well as taking care of this patient today      Vitals:   03/11/23 2321 03/12/23 0316 03/12/23 0930 03/12/23 1213  BP: (!) 97/51 134/62 129/61 (!) 131/56  Pulse: 71 73 81 86  Resp: 20 20 18 16   Temp: 98.1 F (36.7 C) 97.9 F (36.6 C) 98.1 F (36.7 C) 98.3 F (36.8 C)  TempSrc:      SpO2: 92% 94% 97% 97%  Weight:      Height:        Author: Loyce Dys, MD 03/12/2023 4:30 PM  For on call review [email or web address].

## 2023-03-12 NOTE — Care Management Important Message (Signed)
Important Message  Patient Details  Name: Kiannah Menth Femia MRN: [id number] Date of Birth: October 18, 1936   Medicare Important Message Given:  Yes     Olegario Messier A Dearius Hoffmann 03/12/2023, 11:27 AM

## 2023-03-12 NOTE — TOC Progression Note (Signed)
Transition of Care [facility]) - Progression Note    Patient Details  Name: Marilyn Andrews MRN: [id number] Date of Birth: April 13, 1936  [facility] CM/SW Contact  Truddie Hidden, RN Phone Number: 03/12/2023, 10:09 AM  Clinical Narrative:    FL2 completed. Bed search initiated.         Expected Discharge Plan and Services                                               Social Determinants of Health (SDOH) Interventions SDOH Screenings   Food Insecurity: No Food Insecurity (03/07/2023)  Housing: Low Risk  (03/07/2023)  Transportation Needs: No Transportation Needs (03/07/2023)  Utilities: Not At Risk (03/07/2023)  Tobacco Use: Low Risk  (03/10/2023)    Readmission Risk Interventions     No data to display

## 2023-03-12 NOTE — Progress Notes (Signed)
*  PRELIMINARY RESULTS* Echocardiogram A Limited 2D Echocardiogram has been performed.  Carolyne Fiscal 03/12/2023, 11:17 AM

## 2023-03-12 NOTE — Progress Notes (Signed)
Physical Therapy Treatment Patient Details Name: Marilyn Andrews MRN: [id number] DOB: 13-Dec-1935 Today's Date: 03/12/2023   History of Present Illness Marilyn Andrews is an 87 y.o. female with medical history significant of prior episodes of diverticulitis. Arrives from [facility] with nausea/vomiting/pain in the left lower quadrant.  While here found to have had PE and is s/p thrombectomy 5/7.    PT Comments    Pt was pleasant and motivated to participate during the session and put forth good effort throughout.  Pt required only min assist with below functional tasks along with cuing for sequencing.  During ambulation pt presented with no buckling or LOB but did require assist to manage the RW and was only able to amb a max of around 4 feet before becoming fatigued and needing to return to sitting.  Pt reported no adverse symptoms during the session and was left with all needs in reach.  Pt will benefit from continued PT services upon discharge to safely address deficits listed in patient problem list for decreased caregiver assistance and eventual return to PLOF.     Recommendations for follow up therapy are one component of a multi-disciplinary discharge planning process, led by the attending physician.  Recommendations may be updated based on patient status, additional functional criteria and insurance authorization.  Follow Up Recommendations  Can patient physically be transported by private vehicle: No    Assistance Recommended at Discharge Intermittent Supervision/Assistance  Patient can return home with the following Help with stairs or ramp for entrance;Assist for transportation;A little help with bathing/dressing/bathroom;Assistance with cooking/housework;A lot of help with walking and/or transfers   Equipment Recommendations  None recommended by PT    Recommendations for Other Services       Precautions / Restrictions Precautions Precautions: Fall Restrictions Weight Bearing  Restrictions: No     Mobility  Bed Mobility Overal bed mobility: Needs Assistance       Supine to sit: Min assist     General bed mobility comments: Min A for trunk control and rotation once in sitting with use of the bed rail and cues for sequencing    Transfers Overall transfer level: Needs assistance Equipment used: Rolling walker (2 wheels) Transfers: Sit to/from Stand Sit to Stand: Min assist, From elevated surface           General transfer comment: Min verbal cues for increased trunk flexion and hand placement    Ambulation/Gait Ambulation/Gait assistance: Min assist Gait Distance (Feet): 4 Feet Assistive device: Rolling walker (2 wheels) Gait Pattern/deviations: Step-to pattern, Decreased step length - right, Decreased step length - left Gait velocity: decreased     General Gait Details: Pt able to take several small steps at the EOB and then to the chair with very slow cadence and short B step lengths and with min A to guide the RW but no overt LOB this session   Stairs             Wheelchair Mobility    Modified Rankin (Stroke Patients Only)       Balance Overall balance assessment: Needs assistance Sitting-balance support: No upper extremity supported, Feet supported Sitting balance-Leahy Scale: Good     Standing balance support: Reliant on assistive device for balance, During functional activity, Bilateral upper extremity supported Standing balance-Leahy Scale: Fair                              Cognition Arousal/Alertness: Awake/alert Behavior During  Therapy: WFL for tasks assessed/performed Overall Cognitive Status: Within Functional Limits for tasks assessed                                          Exercises Other Exercises Other Exercises: Static and dynamic sitting at EOB x 10 min with reaching outside BOS for improved activity tolerance    General Comments        Pertinent Vitals/Pain Pain  Assessment Pain Assessment: No/denies pain    Home Living                          Prior Function            PT Goals (current goals can now be found in the care plan section) Progress towards PT goals: Progressing toward goals    Frequency    Min 2X/week      PT Plan Current plan remains appropriate    Co-evaluation              AM-PAC PT "6 Clicks" Mobility   Outcome Measure  Help needed turning from your back to your side while in a flat bed without using bedrails?: None Help needed moving from lying on your back to sitting on the side of a flat bed without using bedrails?: A Little Help needed moving to and from a bed to a chair (including a wheelchair)?: A Little Help needed standing up from a chair using your arms (e.g., wheelchair or bedside chair)?: A Little Help needed to walk in hospital room?: A Lot Help needed climbing 3-5 steps with a railing? : Total 6 Click Score: 16    End of Session Equipment Utilized During Treatment: Oxygen;Gait belt Activity Tolerance: Patient tolerated treatment well Patient left: in chair;with chair alarm set;with call bell/phone within reach Nurse Communication: Mobility status PT Visit Diagnosis: Muscle weakness (generalized) (M62.81);Other abnormalities of gait and mobility (R26.89);Difficulty in walking, not elsewhere classified (R26.2)     Time: 1610-9604 PT Time Calculation (min) (ACUTE ONLY): 33 min  Charges:  $Therapeutic Exercise: 8-22 mins $Therapeutic Activity: 8-22 mins                     D. Scott Dolora Ridgely PT, DPT 03/12/23, 5:24 PM

## 2023-03-13 DIAGNOSIS — R112 Nausea with vomiting, unspecified: Secondary | ICD-10-CM | POA: Diagnosis not present

## 2023-03-13 LAB — BASIC METABOLIC PANEL
Anion gap: 3 — ABNORMAL LOW (ref 5–15)
BUN: 10 mg/dL (ref 8–23)
CO2: 26 mmol/L (ref 22–32)
Calcium: 8.4 mg/dL — ABNORMAL LOW (ref 8.9–10.3)
Chloride: 110 mmol/L (ref 98–111)
Creatinine, Ser: 0.77 mg/dL (ref 0.44–1.00)
GFR, Estimated: >60 mL/min (ref >60)
Glucose, Bld: 107 mg/dL — ABNORMAL HIGH (ref 70–99)
Potassium: 3.8 mmol/L (ref 3.5–5.1)
Sodium: 139 mmol/L (ref 135–145)

## 2023-03-13 LAB — CBC WITH DIFFERENTIAL/PLATELET
Abs Immature Granulocytes: 0.07 10*3/uL (ref 0.00–0.07)
Basophils Absolute: 0.1 10*3/uL (ref 0.0–0.1)
Basophils Relative: 1 %
Eosinophils Absolute: 0.8 10*3/uL — ABNORMAL HIGH (ref 0.0–0.5)
Eosinophils Relative: 10 %
HCT: 32.8 % — ABNORMAL LOW (ref 36.0–46.0)
Hemoglobin: 10.8 g/dL — ABNORMAL LOW (ref 12.0–15.0)
Immature Granulocytes: 1 %
Lymphocytes Relative: 16 %
Lymphs Abs: 1.3 10*3/uL (ref 0.7–4.0)
MCH: 28.5 pg (ref 26.0–34.0)
MCHC: 32.9 g/dL (ref 30.0–36.0)
MCV: 86.5 fL (ref 80.0–100.0)
Monocytes Absolute: 0.4 10*3/uL (ref 0.1–1.0)
Monocytes Relative: 5 %
Neutro Abs: 5.7 10*3/uL (ref 1.7–7.7)
Neutrophils Relative %: 67 %
Platelets: 299 10*3/uL (ref 150–400)
RBC: 3.79 MIL/uL — ABNORMAL LOW (ref 3.87–5.11)
RDW: 14.7 % (ref 11.5–15.5)
WBC: 8.4 10*3/uL (ref 4.0–10.5)
nRBC: 0 % (ref 0.0–0.2)

## 2023-03-13 NOTE — Progress Notes (Signed)
Progress Note   Patient: Marilyn Andrews ZOX:[id number] DOB: 11-Feb-1936 DOA: 03/07/2023     6 DOS: the patient was seen and examined on 03/13/2023    Subjective:  Patient seen and examined at bedside this morning Her generalized weakness is improving Patient tells me she is looking to be able to get out of bed and walk around before she leaves. She is working with PT OT who have recommended skilled nursing facility/short-term rehab Case workers have initiated on bed search Denies chest pain nausea vomiting abdominal pain     Brief hospital course: From HPI "[name] is a 87 year old female with history of diverticulosis with diverticulitis, hypertension, GERD, generalized weakness, chronic pain low back and bilateral knee pain, chronic osteoarthritis, erythrocytosis, who presents to the emergency department from [facility] for chief concerns of intractable nausea and vomiting, left lower quadrant pain. Also (+)dysuria.  05/05: in ED, VSS, mild hypokalemia, mild AKI, abn UA w/ 6-10 WBC and many bacteria w/ 5 ketones, Procal <0.01. CXR no concerns. CT Abd/Pelv (+)Severe sigmoid diverticulosis, no acute diverticulitis. Mild thickening of the sigmoid colonic wall, likely d/t diverticulosis, no perforation/abscess. (+)thrombus in the incompletely imaged right lower lobe pulmonary vessel concerning for pulmonary embolism. CT chest for further evaluation is suggested. DVT US LE (+)occlusive thrombus in the right femoral, popliteal, posterior tibial and peroneal veins. Given apixiban, potassium, 1L NS, nausea control. Admitted to hospitalist service. Follow BMP and plan CTA if renal fxn appropriate. 05/06: AKI resolved, CTA chest (+)saddle PE w/ RH strain. Vascular consulted - plan thrombectomy tomorrow. Heparin gtt started. Echo ordered.  05/07: thrombectomy today. Echo EF 60-65, no RWMA, mild LVH, G1DD, RV systolic fxn WNL, tricuspid regurg signal inadequate to assess PA pressure. "        Consultants:  Vascular Surgery    Procedures: Thrombectomy done on 03/09/2023   ASSESSMENT & PLAN:   Principal Problem:   Intractable nausea and vomiting Active Problems:   Acute pulmonary embolism (HCC)   HTN (hypertension)   GERD (gastroesophageal reflux disease)   Depression   Generalized weakness   Chronic pain   Erythrocytosis   Hypokalemia   CKD stage 3a, GFR 45-59 ml/min (HCC)   Pyuria   Restless leg     Acute submassive saddle pulmonary embolism (HCC) w/ RV strain  Acute occlusive thrombus in the right femoral, popliteal, posterior tibial and peroneal veins Continue Eliquis S/p thrombectomy by vascular surgeon done on 03/09/2023     Possible ASD or PFO ruled out S/p thrombectomy by vascular surgeon done on 03/09/2023 Discussed with vascular surgeon as well as cardiologist There was suspicion for ASD/PFO however after bubble study done by cardiologist this was ruled out Given the findings on CT angio cardiologist has recommended continuation of Eliquis I have discussed these findings with the patient   Intractable nausea and vomiting Presumed secondary to acute PE Symptomatic support with ondansetron 4 mg p.o./IV as needed for nausea and vomiting; Phenergan 12.5 mg IV every 6 hours as needed for refractory nausea and vomiting, 18 hours of coverage ordered Protonix 40 mg   HTN (hypertension) Amlodipine 10 mg daily resumed Sinew to monitor blood pressure closely   Hypokalemia-improved Suspect secondary to GI loss Check magnesium level on admission --> WNL Replace as needed Monitor BMP    GERD (gastroesophageal reflux disease) Protonix 40 mg IV nightly, 2 days ordered in setting of intractable nausea and vomiting   CKD stage 3a, GFR 45-59 ml/min (HCC) At baseline, with mild increase  in serum creatinine and decrease in EGFR compared to prior, does not meet criteria for acute kidney injury; presumed secondary to prerenal, from GI loss Follow BMP    Pyuria but  pt noew denies dysuria ESBL likely colonization D/c abx    Restless leg Continue Requip 0.5 mg nightly     Depression Escitalopram 20 mg daily resumed Lorazepam 0.5 mg every 6 hours as needed for anxiety resumed   Chronic pain Home tizanidine 2 mg every 8 hours as needed for muscle spasms resumed Tramadol 50 mg twice daily as needed for moderate pain resumed   Generalized weakness Fall precautions, aspiration, PT, OT, TOC consulted       DVT prophylaxis: n/a, treating w/ Eliquis    Central lines / invasive devices: none   Code Status: DNR   Current Admission Status: inpatient     Barriers to discharge / significant pending items: S post thrombectomy needs clearance by vascular surgeon and cardiologist before discharge    Family Communication: none at this time   Laboratory data reviewed: I personally reviewed patient's laboratory data today showing sodium 139 potassium 3.8 creatinine 0.7   Physical Exam: She is VERY hard of hearing  Constitutional:      General: She is not in acute distress. Cardiovascular:     Rate and Rhythm: Normal rate and regular rhythm.  Pulmonary:     Effort: Pulmonary effort is normal. No respiratory distress.     Breath sounds: Normal breath sounds.  Abdominal:     General: Abdomen is flat.     Palpations: Abdomen is soft.  Skin:    General: Skin is warm and dry.  Neurological:     Mental Status: She is alert and oriented to person, place, and time. Mental status is at baseline.      I spent a total of 40 minutes      Vitals:   03/12/23 2317 03/13/23 0329 03/13/23 0745 03/13/23 1206  BP: (!) 128/55 (!) 132/56 128/63 (!) 113/52  Pulse: 81 75 72 65  Resp: 20 20 16 18   Temp: 97.6 F (36.4 C) 98.1 F (36.7 C) 98.4 F (36.9 C) 97.7 F (36.5 C)  TempSrc:   Oral   SpO2: 95% 99% 95% 98%  Weight:      Height:         Author: Loyce Dys, MD 03/13/2023 2:34 PM  For on call review [email or web address].

## 2023-03-13 NOTE — TOC Progression Note (Signed)
Transition of Care [facility]) - Progression Note    Patient Details  Name: Marilyn Andrews MRN: [id number] Date of Birth: 1936-08-04  Transition of [facility]) CM/SW Contact  Kemper Durie, RN Phone Number: 03/13/2023, 3:15 PM  Clinical Narrative:      Spoke with patient to present bed offers ([facility] and [place]).  Declines offer for , state she would like to speak to son about [facility]. Also advised that [facility], [facility], and [place] still pending.  TOC will continue to follow for choice.       Expected Discharge Plan and Services                                               Social Determinants of Health (SDOH) Interventions SDOH Screenings   Food Insecurity: No Food Insecurity (03/07/2023)  Housing: Low Risk  (03/07/2023)  Transportation Needs: No Transportation Needs (03/07/2023)  Utilities: Not At Risk (03/07/2023)  Tobacco Use: Low Risk  (03/10/2023)    Readmission Risk Interventions     No data to display

## 2023-03-14 DIAGNOSIS — R112 Nausea with vomiting, unspecified: Secondary | ICD-10-CM | POA: Diagnosis not present

## 2023-03-14 LAB — BASIC METABOLIC PANEL
Anion gap: 5 (ref 5–15)
BUN: 10 mg/dL (ref 8–23)
CO2: 24 mmol/L (ref 22–32)
Calcium: 8.4 mg/dL — ABNORMAL LOW (ref 8.9–10.3)
Chloride: 107 mmol/L (ref 98–111)
Creatinine, Ser: 0.66 mg/dL (ref 0.44–1.00)
GFR, Estimated: >60 mL/min (ref >60)
Glucose, Bld: 116 mg/dL — ABNORMAL HIGH (ref 70–99)
Potassium: 3.8 mmol/L (ref 3.5–5.1)
Sodium: 136 mmol/L (ref 135–145)

## 2023-03-14 NOTE — Progress Notes (Signed)
Progress Note   Patient: Marilyn Andrews ZOX:[id number] DOB: [birth year] DOA: 03/07/2023     7 DOS: the patient was seen and examined on 03/14/2023      Subjective:  Patient seen and examined at bedside this morning Her generalized weakness is improving Marilyn Andrews is working with PT OT who have recommended skilled nursing facility/short-term rehab Case workers have initiated on bed search Denies chest pain nausea vomiting abdominal pain     Brief hospital course: From HPI "Marilyn Andrews is a 87 year old female with history of diverticulosis with diverticulitis, hypertension, GERD, generalized weakness, chronic pain low back and bilateral knee pain, chronic osteoarthritis, erythrocytosis, who presents to the emergency department from [facility] for chief concerns of intractable nausea and vomiting, left lower quadrant pain. Also (+)dysuria.  05/05: in ED, VSS, mild hypokalemia, mild AKI, abn UA w/ 6-10 WBC and many bacteria w/ 5 ketones, Procal <0.01. CXR no concerns. CT Abd/Pelv (+)Severe sigmoid diverticulosis, no acute diverticulitis. Mild thickening of the sigmoid colonic wall, likely d/t diverticulosis, no perforation/abscess. (+)thrombus in the incompletely imaged right lower lobe pulmonary vessel concerning for pulmonary embolism. CT chest for further evaluation is suggested. DVT US LE (+)occlusive thrombus in the right femoral, popliteal, posterior tibial and peroneal veins. Given apixiban, potassium, 1L NS, nausea control. Admitted to hospitalist service. Follow BMP and plan CTA if renal fxn appropriate. 05/06: AKI resolved, CTA chest (+)saddle PE w/ RH strain. Vascular consulted - plan thrombectomy tomorrow. Heparin gtt started. Echo ordered.  05/07: thrombectomy today. Echo EF 60-65, no RWMA, mild LVH, G1DD, RV systolic fxn WNL, tricuspid regurg signal inadequate to assess PA pressure. "       Consultants:  Vascular Surgery    Procedures: Thrombectomy done on 03/09/2023   ASSESSMENT &  PLAN:   Principal Problem:   Intractable nausea and vomiting Active Problems:   Acute pulmonary embolism (HCC)   HTN (hypertension)   GERD (gastroesophageal reflux disease)   Depression   Generalized weakness   Chronic pain   Erythrocytosis   Hypokalemia   CKD stage 3a, GFR 45-59 ml/min (HCC)   Pyuria   Restless leg     Acute submassive saddle pulmonary embolism (HCC) w/ RV strain  Acute occlusive thrombus in the right femoral, popliteal, posterior tibial and peroneal veins Continue Eliquis S/p thrombectomy by vascular surgeon done on 03/09/2023     Possible ASD or PFO ruled out S/p thrombectomy by vascular surgeon done on 03/09/2023 Discussed with vascular surgeon as well as cardiologist There was suspicion for ASD/PFO however after bubble study done by cardiologist this was ruled out Given the findings on CT angio cardiologist has recommended continuation of Eliquis I have discussed these findings with the patient   Intractable nausea and vomiting Presumed secondary to acute PE Symptomatic support with ondansetron 4 mg p.o./IV as needed for nausea and vomiting; Phenergan 12.5 mg IV every 6 hours as needed for refractory nausea and vomiting, 18 hours of coverage ordered Protonix 40 mg   HTN (hypertension) Amlodipine 10 mg daily resumed Sinew to monitor blood pressure closely   Hypokalemia-improved Suspect secondary to GI loss Check magnesium level on admission --> WNL Replace as needed Monitor BMP     GERD (gastroesophageal reflux disease) Protonix 40 mg IV nightly, 2 days ordered in setting of intractable nausea and vomiting   CKD stage 3a, GFR 45-59 ml/min (HCC) At baseline, with mild increase in serum creatinine and decrease in EGFR compared to prior, does not meet criteria for acute kidney  injury; presumed secondary to prerenal, from GI loss Follow BMP    Pyuria but pt noew denies dysuria ESBL likely colonization D/c abx    Restless leg Continue Requip 0.5  mg nightly     Depression Escitalopram 20 mg daily resumed Lorazepam 0.5 mg every 6 hours as needed for anxiety resumed   Chronic pain Home tizanidine 2 mg every 8 hours as needed for muscle spasms resumed Tramadol 50 mg twice daily as needed for moderate pain resumed   Generalized weakness Fall precautions, aspiration, PT, OT, TOC consulted       DVT prophylaxis: n/a, treating w/ Eliquis    Central lines / invasive devices: none   Code Status: DNR   Current Admission Status: inpatient     Barriers to discharge / significant pending items: S post thrombectomy needs clearance by vascular surgeon and cardiologist before discharge    Family Communication: Spoke with patient's son over the phone today   Laboratory data reviewed: I personally reviewed patient's laboratory data today showing sodium 136 potassium 3.8 creatinine 0.6   Physical Exam: Marilyn Andrews is VERY hard of hearing  Constitutional:      General: Marilyn Andrews is not in acute distress. Cardiovascular:     Rate and Rhythm: Normal rate and regular rhythm.  Pulmonary:     Effort: Pulmonary effort is normal. No respiratory distress.     Breath sounds: Normal breath sounds.  Abdominal:     General: Abdomen is flat.     Palpations: Abdomen is soft.  Skin:    General: Skin is warm and dry.  Neurological:     Mental Status: Marilyn Andrews is alert and oriented to person, place, and time. Mental status is at baseline.      I spent a total of 45 minutes       Vitals:   03/14/23 0548 03/14/23 0743 03/14/23 1250 03/14/23 1633  BP: (!) 136/57 (!) 151/67 (!) 133/51 (!) 118/54  Pulse:  82 64 70  Resp: 18 20 (!) 24 20  Temp: 98.2 F (36.8 C) 97.7 F (36.5 C) 98.2 F (36.8 C) (!) 97.1 F (36.2 C)  TempSrc: Oral Oral    SpO2: 94% 97% 100% 99%  Weight:      Height:        Author: Loyce Dys, MD 03/14/2023 5:14 PM  For on call review [email or web address].

## 2023-03-15 DIAGNOSIS — R112 Nausea with vomiting, unspecified: Secondary | ICD-10-CM | POA: Diagnosis not present

## 2023-03-15 LAB — BASIC METABOLIC PANEL
Anion gap: 5 (ref 5–15)
BUN: 11 mg/dL (ref 8–23)
CO2: 26 mmol/L (ref 22–32)
Calcium: 8.7 mg/dL — ABNORMAL LOW (ref 8.9–10.3)
Chloride: 108 mmol/L (ref 98–111)
Creatinine, Ser: 0.74 mg/dL (ref 0.44–1.00)
GFR, Estimated: >60 mL/min (ref >60)
Glucose, Bld: 99 mg/dL (ref 70–99)
Potassium: 3.8 mmol/L (ref 3.5–5.1)
Sodium: 139 mmol/L (ref 135–145)

## 2023-03-15 NOTE — Progress Notes (Signed)
Progress Note   Patient: Marilyn Andrews WGN:[id number] DOB: [birth year] DOA: 03/07/2023     8 DOS: the patient was seen and examined on 03/15/2023       Subjective:  Patient seen and examined at bedside this morning Her generalized weakness is improving She is working with PT OT who have recommended skilled nursing facility/short-term rehab I understand patient will need insurance prior authorization. Patient and her son still yet to make decision on bed offers Denies chest pain nausea vomiting abdominal pain     Brief hospital course: From HPI "Ms. Rosicela Hiller is a 87 year old female with history of diverticulosis with diverticulitis, hypertension, GERD, generalized weakness, chronic pain low back and bilateral knee pain, chronic osteoarthritis, erythrocytosis, who presents to the emergency department from [facility] for chief concerns of intractable nausea and vomiting, left lower quadrant pain. Also (+)dysuria.  05/05: in ED, VSS, mild hypokalemia, mild AKI, abn UA w/ 6-10 WBC and many bacteria w/ 5 ketones, Procal <0.01. CXR no concerns. CT Abd/Pelv (+)Severe sigmoid diverticulosis, no acute diverticulitis. Mild thickening of the sigmoid colonic wall, likely d/t diverticulosis, no perforation/abscess. (+)thrombus in the incompletely imaged right lower lobe pulmonary vessel concerning for pulmonary embolism. CT chest for further evaluation is suggested. DVT US LE (+)occlusive thrombus in the right femoral, popliteal, posterior tibial and peroneal veins. Given apixiban, potassium, 1L NS, nausea control. Admitted to hospitalist service. Follow BMP and plan CTA if renal fxn appropriate. 05/06: AKI resolved, CTA chest (+)saddle PE w/ RH strain. Vascular consulted - plan thrombectomy tomorrow. Heparin gtt started. Echo ordered.  05/07: thrombectomy today. Echo EF 60-65, no RWMA, mild LVH, G1DD, RV systolic fxn WNL, tricuspid regurg signal inadequate to assess PA pressure. "       Consultants:   Vascular Surgery    Procedures: Thrombectomy done on 03/09/2023   ASSESSMENT & PLAN:   Principal Problem:   Intractable nausea and vomiting Active Problems:   Acute pulmonary embolism (HCC)   HTN (hypertension)   GERD (gastroesophageal reflux disease)   Depression   Generalized weakness   Chronic pain   Erythrocytosis   Hypokalemia   CKD stage 3a, GFR 45-59 ml/min (HCC)   Pyuria   Restless leg     Acute submassive saddle pulmonary embolism (HCC) w/ RV strain  Acute occlusive thrombus in the right femoral, popliteal, posterior tibial and peroneal veins Continue Eliquis S/p thrombectomy by vascular surgeon done on 03/09/2023     Possible ASD or PFO ruled out S/p thrombectomy by vascular surgeon done on 03/09/2023 Discussed with vascular surgeon as well as cardiologist There was suspicion for ASD/PFO however after bubble study done by cardiologist this was ruled out Given the findings on CT angio cardiologist has recommended continuation of Eliquis I have discussed these findings with the patient   Intractable nausea and vomiting Presumed secondary to acute PE Symptomatic support with ondansetron 4 mg p.o./IV as needed for nausea and vomiting; Phenergan 12.5 mg IV every 6 hours as needed for refractory nausea and vomiting, 18 hours of coverage ordered Protonix 40 mg   HTN (hypertension) Amlodipine 10 mg daily resumed Sinew to monitor blood pressure closely   Hypokalemia-improved Suspect secondary to GI loss Checked magnesium level on admission --> WNL Replace as needed Monitor BMP     GERD (gastroesophageal reflux disease) Protonix 40 mg   CKD stage 3a, GFR 45-59 ml/min (HCC) At baseline, with mild increase in serum creatinine and decrease in EGFR compared to prior, does not meet criteria for  acute kidney injury; presumed secondary to prerenal, from GI loss Follow BMP    Pyuria but pt noew denies dysuria ESBL likely colonization D/c abx    Restless  leg Continue Requip 0.5 mg nightly     Depression Escitalopram 20 mg daily resumed Lorazepam 0.5 mg every 6 hours as needed for anxiety resumed   Chronic pain Home tizanidine 2 mg every 8 hours as needed for muscle spasms resumed Tramadol 50 mg twice daily as needed for moderate pain resumed   Generalized weakness Fall precautions, aspiration, PT, OT, TOC consulted       DVT prophylaxis: n/a, treating w/ Eliquis    Central lines / invasive devices: none   Code Status: DNR   Current Admission Status: inpatient     Barriers to discharge / significant pending items: S post thrombectomy needs clearance by vascular surgeon and cardiologist before discharge    Family Communication: Spoke with patient's son over the phone today   Laboratory data reviewed: I personally reviewed patient's laboratory data today showing sodium 139 potassium 3.8 creatinine 0.7   Physical Exam: She is VERY hard of hearing  Constitutional:      General: She is not in acute distress. Cardiovascular:     Rate and Rhythm: Normal rate and regular rhythm.  Pulmonary:     Effort: Pulmonary effort is normal. No respiratory distress.     Breath sounds: Normal breath sounds.  Abdominal:     General: Abdomen is flat.     Palpations: Abdomen is soft.  Skin:    General: Skin is warm and dry.  Neurological:     Mental Status: She is alert and oriented to person, place, and time. Mental status is at baseline.      I spent a total of 42 minutes         Vitals:   03/15/23 0731 03/15/23 1000 03/15/23 1044 03/15/23 1234  BP: (!) 130/56   135/62  Pulse: 69   70  Resp: 16 18 18 18   Temp: 97.6 F (36.4 C)   97.8 F (36.6 C)  TempSrc:    Oral  SpO2: 96%   98%  Weight:      Height:         Author: Loyce Dys, MD 03/15/2023 4:11 PM  For on call review [email or web address].

## 2023-03-15 NOTE — Progress Notes (Signed)
Occupational Therapy Treatment Patient Details Name: Marilyn Andrews MRN: [id number] DOB: 1935-11-29 Today's Date: 03/15/2023   History of present illness Marilyn Andrews is an 87 y.o. female with medical history significant of prior episodes of diverticulitis. [facility] from [facility] with nausea/vomiting/pain in the left lower quadrant.  While here found to have had PE and is s/p thrombectomy 5/7.   OT comments  Pt seen for OT treatment on this date. Upon arrival to room pt was laying in bed, agreeable to tx. Pt requires Min A during sit to stand, MIN A for clothing management, MOD A for peri care. Pt demonstrated fair effort during session with prompts for independence and engagement. Pt was nauseous throughout treatment. RN was made aware. Pt is making good progress toward goals, will continue to follow POC. Discharge recommendation remains appropriate.     Recommendations for follow up therapy are one component of a multi-disciplinary discharge planning process, led by the attending physician.  Recommendations may be updated based on patient status, additional functional criteria and insurance authorization.    Assistance Recommended at Discharge Frequent or constant Supervision/Assistance  Patient can return home with the following  A lot of help with bathing/dressing/bathroom;Assistance with cooking/housework;Assist for transportation;Help with stairs or ramp for entrance;A little help with walking and/or transfers   Equipment Recommendations  Other (comment) (Defer)    Recommendations for Other Services      Precautions / Restrictions Precautions Precautions: Fall       Mobility Bed Mobility Overal bed mobility: Needs Assistance Bed Mobility: Supine to Sit, Sit to Supine     Supine to sit: Min guard Sit to supine: Min guard   General bed mobility comments: MIN guard - requires increased time    Transfers Overall transfer level: Needs assistance Equipment used: Rolling walker (2  wheels) Transfers: Bed to chair/wheelchair/BSC Sit to Stand: Min assist Stand pivot transfers: Min assist   Step pivot transfers: Min assist           Balance Overall balance assessment: Needs assistance Sitting-balance support: No upper extremity supported, Feet supported Sitting balance-Leahy Scale: Good   Postural control: Posterior lean Standing balance support: Reliant on assistive device for balance, During functional activity, Bilateral upper extremity supported Standing balance-Leahy Scale: Fair                             ADL either performed or assessed with clinical judgement   ADL Overall ADL's : Needs assistance/impaired                         Toilet Transfer: Minimal assistance;Stand-pivot;Rolling walker (2 wheels)   Toileting- Clothing Manipulation and Hygiene: Minimal assistance;Bed level Toileting - Clothing Manipulation Details (indicate cue type and reason): MOD A provided for peri care while standing and finished at bed level. Pt was prompted to increase engagement and independence during hygiene and donning/doffing briefs.            Extremity/Trunk Assessment              Vision       Perception     Praxis      Cognition Arousal/Alertness: Awake/alert Behavior During Therapy: WFL for tasks assessed/performed Overall Cognitive Status: Within Functional Limits for tasks assessed  General Comments: Pt was willing to participate with coaching and prompting to be more independent.        Exercises      Shoulder Instructions       General Comments      Pertinent Vitals/ Pain       Pain Assessment Pain Assessment: 0-10 Pain Score: 0-No pain  Home Living                                          Prior Functioning/Environment              Frequency  Min 2X/week        Progress Toward Goals  OT Goals(current goals can now be found  in the care plan section)  Progress towards OT goals: Progressing toward goals  Acute Rehab OT Goals Patient Stated Goal: To feel better OT Goal Formulation: With patient Time For Goal Achievement: 03/25/23 Potential to Achieve Goals: Fair ADL Goals Pt Will Perform Grooming: with modified independence;standing Pt Will Perform Lower Body Dressing: with min guard assist;sit to/from stand;with adaptive equipment Pt Will Transfer to Toilet: with supervision;ambulating;bedside commode Pt Will Perform Toileting - Clothing Manipulation and hygiene: with modified independence Additional ADL Goal #1: Pt will verbalize plan to implement at least 1 learned falls prevention strategy to minimize falls risk.  Plan Discharge plan remains appropriate;Frequency remains appropriate    Co-evaluation                 AM-PAC OT "6 Clicks" Daily Activity     Outcome Measure   Help from another person eating meals?: None Help from another person taking care of personal grooming?: A Little Help from another person toileting, which includes using toliet, bedpan, or urinal?: A Lot Help from another person bathing (including washing, rinsing, drying)?: A Lot Help from another person to put on and taking off regular upper body clothing?: A Little Help from another person to put on and taking off regular lower body clothing?: A Lot 6 Click Score: 16    End of Session Equipment Utilized During Treatment: Rolling walker (2 wheels)  OT Visit Diagnosis: Muscle weakness (generalized) (M62.81)   Activity Tolerance Patient tolerated treatment well   Patient Left in bed;with call bell/phone within reach;with bed alarm set   Nurse Communication Patient requests pain meds (Patient reported nausea and dizziness throughout session without relief from positioning or activity.)        Time: 1610-9604 OT Time Calculation (min): 34 min  Charges: OT General Charges $OT Visit: 1 Visit OT Treatments $Self  Care/Home Management : 23-37 mins  [employer], OTS

## 2023-03-15 NOTE — TOC Progression Note (Signed)
Transition of Care [facility]) - Progression Note    Patient Details  Name: Marilyn Andrews MRN: [id number] Date of Birth: [birth year]  Transition of Care [facility]) CM/SW Contact  Truddie Hidden, RN Phone Number: 03/15/2023, 1:43 PM  Clinical Narrative:    Spoke with patient's son regarding discharge plan. He was provided bed offers. He is agreeable to [facility] but would like to follow up with the patient before his final decision. He has started the [facility] application and inquired about a EDD. He was advised d/c would likely be in the next few days. He has also been informed insurance authorization would have to be approved.         Expected Discharge Plan and Services                                               Social Determinants of Health (SDOH) Interventions SDOH Screenings   Food Insecurity: No Food Insecurity (03/07/2023)  Housing: Low Risk  (03/07/2023)  Transportation Needs: No Transportation Needs (03/07/2023)  Utilities: Not At Risk (03/07/2023)  Tobacco Use: Low Risk  (03/10/2023)    Readmission Risk Interventions     No data to display

## 2023-03-16 DIAGNOSIS — R112 Nausea with vomiting, unspecified: Secondary | ICD-10-CM | POA: Diagnosis not present

## 2023-03-16 LAB — BASIC METABOLIC PANEL
Anion gap: 5 (ref 5–15)
BUN: 11 mg/dL (ref 8–23)
CO2: 25 mmol/L (ref 22–32)
Calcium: 8.8 mg/dL — ABNORMAL LOW (ref 8.9–10.3)
Chloride: 110 mmol/L (ref 98–111)
Creatinine, Ser: 0.7 mg/dL (ref 0.44–1.00)
GFR, Estimated: >60 mL/min (ref >60)
Glucose, Bld: 90 mg/dL (ref 70–99)
Potassium: 3.8 mmol/L (ref 3.5–5.1)
Sodium: 140 mmol/L (ref 135–145)

## 2023-03-16 LAB — GLUCOSE, CAPILLARY: Glucose-Capillary: 79 mg/dL (ref 70–99)

## 2023-03-16 NOTE — Progress Notes (Signed)
Progress Note   Patient: Marilyn Andrews WUJ:[id number] DOB: [birth year] DOA: 03/07/2023     9 DOS: the patient was seen and examined on 03/16/2023      Subjective:  Patient seen and examined at bedside this morning Her generalized weakness is improving She is working with PT OT who have recommended skilled nursing facility/short-term rehab I understand patient will need insurance prior authorization. Patient and her son still yet to make decision on bed offers Have discussed the case with case managers today Denies chest pain nausea vomiting abdominal pain     Brief hospital course: From HPI "[name] is a 87 year old female with history of diverticulosis with diverticulitis, hypertension, GERD, generalized weakness, chronic pain low back and bilateral knee pain, chronic osteoarthritis, erythrocytosis, who presents [facility] the emergency [facility] for chief concerns of intractable nausea and vomiting, left lower quadrant pain. Also (+)dysuria.  05/05: in ED, VSS, mild hypokalemia, mild AKI, abn UA w/ 6-10 WBC and many bacteria w/ 5 ketones, Procal <0.01. CXR no concerns. CT Abd/Pelv (+)Severe sigmoid diverticulosis, no acute diverticulitis. Mild thickening of the sigmoid colonic wall, likely d/t diverticulosis, no perforation/abscess. (+)thrombus in the incompletely imaged right lower lobe pulmonary vessel concerning for pulmonary embolism. CT chest for further evaluation is suggested. DVT US LE (+)occlusive thrombus in the right femoral, popliteal, posterior tibial and peroneal veins. Given apixiban, potassium, 1L NS, nausea control. Admitted to hospitalist service. Follow BMP and plan CTA if renal fxn appropriate. 05/06: AKI resolved, CTA chest (+)saddle PE w/ RH strain. Vascular consulted - plan thrombectomy tomorrow. Heparin gtt started. Echo ordered.  05/07: thrombectomy today. Echo EF 60-65, no RWMA, mild LVH, G1DD, RV systolic fxn WNL, tricuspid regurg signal inadequate  to assess PA pressure. "       Consultants:  Vascular Surgery    Procedures: Thrombectomy done on 03/09/2023   ASSESSMENT & PLAN:   Principal Problem:   Intractable nausea and vomiting Active Problems:   Acute pulmonary embolism (HCC)   HTN (hypertension)   GERD (gastroesophageal reflux disease)   Depression   Generalized weakness   Chronic pain   Erythrocytosis   Hypokalemia   CKD stage 3a, GFR 45-59 ml/min (HCC)   Pyuria   Restless leg     Acute submassive saddle pulmonary embolism (HCC) w/ RV strain  Acute occlusive thrombus in the right femoral, popliteal, posterior tibial and peroneal veins Continue Eliquis S/p thrombectomy by vascular surgeon done on 03/09/2023     Possible ASD or PFO ruled out S/p thrombectomy by vascular surgeon done on 03/09/2023 Discussed with vascular surgeon as well as cardiologist There was suspicion for ASD/PFO however after bubble study done by cardiologist this was ruled out Given the findings on CT angio cardiologist has recommended continuation of Eliquis I have discussed these findings with the patient.   Intractable nausea and vomiting Presumed secondary to acute PE Symptomatic support with ondansetron 4 mg p.o./IV as needed for nausea and vomiting; Phenergan 12.5 mg IV every 6 hours as needed for refractory nausea and vomiting, 18 hours of coverage ordered Protonix 40 mg   HTN (hypertension) Amlodipine 10 mg daily resumed Continue to monitor blood pressure closely   Hypokalemia-improved Suspect secondary to GI loss Checked magnesium level on admission --> WNL Replace as needed Monitor BMP     GERD (gastroesophageal reflux disease) Protonix 40 mg   CKD stage 3a, GFR 45-59 ml/min (HCC) At baseline, with mild increase in serum creatinine and decrease in EGFR compared  to prior, does not meet criteria for acute kidney injury; presumed secondary to prerenal, from GI loss Follow BMP    Pyuria but pt denies dysuria ESBL likely  colonization D/c abx    Restless leg Continue Requip 0.5 mg nightly     Depression Escitalopram 20 mg daily resumed Lorazepam 0.5 mg every 6 hours as needed for anxiety resumed   Chronic pain Home tizanidine 2 mg every 8 hours as needed for muscle spasms resumed Tramadol 50 mg twice daily as needed for moderate pain resumed   Generalized weakness Fall precautions, aspiration, PT, OT, TOC consulted     DVT prophylaxis: n/a, treating w/ Eliquis      Code Status: DNR   Current Admission Status: inpatient     Barriers to discharge / significant pending items: S post thrombectomy currently awaiting skilled nursing facility placement   Family Communication: Spoke with patient's son over the phone    Laboratory data reviewed: I personally reviewed patient's laboratory data today showing sodium 140 potassium 3.8 creatinine 0.7   Physical Exam: She is VERY hard of hearing  Constitutional:      General: She is not in acute distress. Cardiovascular:     Rate and Rhythm: Normal rate and regular rhythm.  Pulmonary:     Effort: Pulmonary effort is normal. No respiratory distress.     Breath sounds: Normal breath sounds.  Abdominal:     General: Abdomen is flat.     Palpations: Abdomen is soft.  Skin:    General: Skin is warm and dry.  Neurological:     Mental Status: She is alert and oriented to person, place, and time. Mental status is at baseline.      I spent a total of 43 minutes       Vitals:   03/15/23 2149 03/16/23 0441 03/16/23 0740 03/16/23 1605  BP: (!) 122/50 (!) 147/65 136/64 119/64  Pulse: 65 70 74 84  Resp: 18 18 18 18   Temp: 98 F (36.7 C) 98 F (36.7 C) 98.4 F (36.9 C) 98.1 F (36.7 C)  TempSrc: Oral Oral    SpO2: 97% 96% 96% 96%  Weight:      Height:        Author: Loyce Dys, MD 03/16/2023 4:15 PM  For on call review [email or web address].

## 2023-03-16 NOTE — Care Management Important Message (Signed)
Important Message  Patient Details  Name: Marilyn Andrews MRN: [id number] Date of Birth: Mar 11, 1936   Medicare Important Message Given:  Yes     [name] 03/16/2023, 3:11 PM

## 2023-03-16 NOTE — Progress Notes (Signed)
Progress Note    03/16/2023 8:07 AM 7 Days Post-Op  Subjective:   Marilyn Andrews is a 87 year old female with history of diverticulosis with diverticulitis, hypertension, GERD, generalized weakness, chronic pain low back and bilateral knee pain, chronic osteoarthritis, erythrocytosis, who presents [facility] the [facility] for chief concerns of intractable nausea and vomiting, left lower quadrant pain.    Patient is now postop day 7 from mechanical thrombectomy of bilateral pulmonary arteries with placement of an Elite IVC filter and right heart strain.  On exam this morning patient is resting comfortably in bed eating breakfast.  She is currently on 2 L of nasal cannula oxygen.  Endorses not having any pain at this time.  She does still endorse feeling weak with nausea.  No complaints overnight vitals all remained stable     Vitals:   03/16/23 0441 03/16/23 0740  BP: (!) 147/65 136/64  Pulse: 70 74  Resp: 18 18  Temp: 98 F (36.7 C) 98.4 F (36.9 C)  SpO2: 96% 96%   Physical Exam: Cardiac:  RRR, Normal S1, S2  Lungs:  Diminished throughout on auscultation with scattered Rhonchi Incisions:  Right groin with dressing clean dry and intact.  Extremities:  Positive palpable pulses throughout Abdomen:  Positive bowel sounds, soft, non tender and non distended Neurologic: A&O X 3; No focal weakness or paresthesias are detected; speech is fluent/normal     CBC    Component Value Date/Time   WBC 8.4 03/13/2023 0518   RBC 3.79 (L) 03/13/2023 0518   HGB 10.8 (L) 03/13/2023 0518   HGB 14.6 02/24/2023 1147   HGB 13.2 03/28/2013 0240   HCT 32.8 (L) 03/13/2023 0518   HCT 38.8 03/28/2013 0240   PLT 299 03/13/2023 0518   PLT 321 02/24/2023 1147   PLT 269 03/28/2013 0240   MCV 86.5 03/13/2023 0518   MCV 85 03/28/2013 0240   MCH 28.5 03/13/2023 0518   MCHC 32.9 03/13/2023 0518   RDW 14.7 03/13/2023 0518   RDW 13.3 03/28/2013 0240   LYMPHSABS 1.3 03/13/2023 0518    LYMPHSABS 1.1 03/28/2013 0240   MONOABS 0.4 03/13/2023 0518   MONOABS 0.8 03/28/2013 0240   EOSABS 0.8 (H) 03/13/2023 0518   EOSABS 0.0 03/28/2013 0240   BASOSABS 0.1 03/13/2023 0518   BASOSABS 0.0 03/28/2013 0240    BMET    Component Value Date/Time   NA 140 03/16/2023 0546   NA 143 03/31/2013 0438   K 3.8 03/16/2023 0546   K 3.7 03/31/2013 0438   CL 110 03/16/2023 0546   CL 109 (H) 03/31/2013 0438   CO2 25 03/16/2023 0546   CO2 28 03/31/2013 0438   GLUCOSE 90 03/16/2023 0546   GLUCOSE 109 (H) 03/31/2013 0438   BUN 11 03/16/2023 0546   BUN 4 (L) 03/31/2013 0438   CREATININE 0.70 03/16/2023 0546   CREATININE 1.04 (H) 02/24/2023 1148   CREATININE 0.95 03/31/2013 0438   CALCIUM 8.8 (L) 03/16/2023 0546   CALCIUM 8.0 (L) 03/31/2013 0438   GFRNONAA >60 03/16/2023 0546   GFRNONAA 53 (L) 02/24/2023 1148   GFRNONAA 59 (L) 03/31/2013 0438   GFRAA 52 (L) 03/07/2020 0457   GFRAA >60 03/31/2013 0438    INR    Component Value Date/Time   INR 1.1 02/09/2023 0613     Intake/Output Summary (Last 24 hours) at 03/16/2023 0807 Last data filed at 03/16/2023 0600 Gross per 24 hour  Intake 400 ml  Output 900 ml  Net -500  ml     Assessment/Plan:  87 y.o. female is s/p 87 y.o. female is s/p mechanical thrombectomy with IVC filter placement for saddle pulmonary embolism.   7 Days Post-Op   PLAN: Per vascular surgery patient needs to be on Eliquis 10 mg twice a day for 7 days then converted to Eliquis 5 mg twice a day. Okay to discharge patient when hospitalist team feels appropriate. Pain control PT/OT  Vascular Surgery signs off this case at this time. If needed again please reconsult.   DVT prophylaxis:  Eliquis 10 mg twice Daily   Damonie Furney R Aoki Wedemeyer [facility] 03/16/2023 8:07 AM

## 2023-03-16 NOTE — Progress Notes (Signed)
Physical Therapy Treatment Patient Details Name: Marilyn Andrews MRN: [id number] DOB: 06-09-1936 Today's Date: 03/16/2023   History of Present Illness Marilyn Andrews is an 87 y.o. female with medical history significant of prior episodes of diverticulitis. Arrives from [facility] with nausea/vomiting/pain in the left lower quadrant.  While here found to have had PE and is s/p thrombectomy 5/7.    PT Comments    Pt was pleasant and motivated to participate during the session and put forth good effort throughout. Pt did require frequent therapeutic rest breaks secondary to back pain and some nausea, nursing notified.  Pt continued to require cuing for proper transfer sequencing with improved trunk flexion after anterior weight shifting activities.  Pt was able to amb a max of 5 feet with very slow cadence and short B step length but was steady with no overt LOB.  Pt will benefit from continued PT services upon discharge to safely address deficits listed in patient problem list for decreased caregiver assistance and eventual return to PLOF.     Recommendations for follow up therapy are one component of a multi-disciplinary discharge planning process, led by the attending physician.  Recommendations may be updated based on patient status, additional functional criteria and insurance authorization.  Follow Up Recommendations  Can patient physically be transported by private vehicle: No    Assistance Recommended at Discharge Intermittent Supervision/Assistance  Patient can return home with the following Help with stairs or ramp for entrance;Assist for transportation;A little help with bathing/dressing/bathroom;Assistance with cooking/housework;A lot of help with walking and/or transfers   Equipment Recommendations  None recommended by PT    Recommendations for Other Services       Precautions / Restrictions Precautions Precautions: Fall Restrictions Weight Bearing Restrictions: No     Mobility  Bed  Mobility               General bed mobility comments: NT, in recliner    Transfers Overall transfer level: Needs assistance Equipment used: Rolling walker (2 wheels) Transfers: Sit to/from Stand Sit to Stand: Min guard           General transfer comment: Min verbal cues for increased trunk flexion and hand placement    Ambulation/Gait Ambulation/Gait assistance: Min guard Gait Distance (Feet): 5 Feet Assistive device: Rolling walker (2 wheels) Gait Pattern/deviations: Decreased step length - right, Decreased step length - left, Step-through pattern Gait velocity: decreased     General Gait Details: Slow cadence with short B step length but steady without LOB   Stairs             Wheelchair Mobility    Modified Rankin (Stroke Patients Only)       Balance Overall balance assessment: Needs assistance Sitting-balance support: No upper extremity supported, Feet supported Sitting balance-Leahy Scale: Good     Standing balance support: Reliant on assistive device for balance, During functional activity, Bilateral upper extremity supported Standing balance-Leahy Scale: Fair                              Cognition Arousal/Alertness: Awake/alert Behavior During Therapy: WFL for tasks assessed/performed Overall Cognitive Status: Within Functional Limits for tasks assessed                                          Exercises Total Joint Exercises Ankle Circles/Pumps: AROM,  Strengthening, Both, 10 reps Hip ABduction/ADduction: AAROM, Strengthening, Both, 5 reps Straight Leg Raises: AAROM, Both, 5 reps, Strengthening Long Arc Quad: AROM, Strengthening, Both, 10 reps Knee Flexion: AROM, Strengthening, Both, 10 reps Other Exercises Other Exercises: Anterior weight shifting in sitting to facilitate improved transfers    General Comments        Pertinent Vitals/Pain Pain Assessment Pain Assessment: 0-10 Pain Score: 9  Pain  Location: back Pain Descriptors / Indicators: Sore, Aching Pain Intervention(s): Repositioned, Monitored during session, Patient requesting pain meds-RN notified    Home Living                          Prior Function            PT Goals (current goals can now be found in the care plan section) Progress towards PT goals: Progressing toward goals    Frequency    Min 2X/week      PT Plan Current plan remains appropriate    Co-evaluation              AM-PAC PT "6 Clicks" Mobility   Outcome Measure  Help needed turning from your back to your side while in a flat bed without using bedrails?: None Help needed moving from lying on your back to sitting on the side of a flat bed without using bedrails?: A Little Help needed moving to and from a bed to a chair (including a wheelchair)?: A Little Help needed standing up from a chair using your arms (e.g., wheelchair or bedside chair)?: A Little Help needed to walk in hospital room?: A Lot Help needed climbing 3-5 steps with a railing? : Total 6 Click Score: 16    End of Session Equipment Utilized During Treatment: Gait belt Activity Tolerance: Patient tolerated treatment well Patient left: in chair;with chair alarm set;with call bell/phone within reach Nurse Communication: Mobility status;Patient requests pain meds PT Visit Diagnosis: Muscle weakness (generalized) (M62.81);Other abnormalities of gait and mobility (R26.89);Difficulty in walking, not elsewhere classified (R26.2)     Time: 1610-9604 PT Time Calculation (min) (ACUTE ONLY): 23 min  Charges:  $Therapeutic Exercise: 8-22 mins $Therapeutic Activity: 8-22 mins                     D. Scott Kaleisha Bhargava PT, DPT 03/16/23, 11:58 AM

## 2023-03-16 NOTE — TOC Progression Note (Signed)
Transition of Care [facility]) - Progression Note    Patient Details  Name: Marilyn Andrews MRN: [id number] Date of Birth: 1936/10/14  [facility]) CM/SW Contact  Allena Katz, LCSW Phone Number: 03/16/2023, 3:28 PM  Clinical Narrative:   Berkley Harvey started for [facility].          Expected Discharge Plan and Services                                               Social Determinants of Health (SDOH) Interventions SDOH Screenings   Food Insecurity: No Food Insecurity (03/07/2023)  Housing: Low Risk  (03/07/2023)  Transportation Needs: No Transportation Needs (03/07/2023)  Utilities: Not At Risk (03/07/2023)  Tobacco Use: Low Risk  (03/10/2023)    Readmission Risk Interventions     No data to display

## 2023-03-17 ENCOUNTER — Inpatient Hospital Stay: Payer: Medicare HMO | Admitting: Oncology

## 2023-03-17 DIAGNOSIS — R112 Nausea with vomiting, unspecified: Secondary | ICD-10-CM | POA: Diagnosis not present

## 2023-03-17 LAB — BASIC METABOLIC PANEL
Anion gap: 9 (ref 5–15)
BUN: 9 mg/dL (ref 8–23)
CO2: 22 mmol/L (ref 22–32)
Calcium: 8.5 mg/dL — ABNORMAL LOW (ref 8.9–10.3)
Chloride: 107 mmol/L (ref 98–111)
Creatinine, Ser: 0.68 mg/dL (ref 0.44–1.00)
GFR, Estimated: >60 mL/min (ref >60)
Glucose, Bld: 90 mg/dL (ref 70–99)
Potassium: 3.6 mmol/L (ref 3.5–5.1)
Sodium: 138 mmol/L (ref 135–145)

## 2023-03-17 LAB — CBC WITH DIFFERENTIAL/PLATELET
Abs Immature Granulocytes: 0.06 10*3/uL (ref 0.00–0.07)
Basophils Absolute: 0.1 10*3/uL (ref 0.0–0.1)
Basophils Relative: 1 %
Eosinophils Absolute: 0.3 10*3/uL (ref 0.0–0.5)
Eosinophils Relative: 3 %
HCT: 35.2 % — ABNORMAL LOW (ref 36.0–46.0)
Hemoglobin: 11.5 g/dL — ABNORMAL LOW (ref 12.0–15.0)
Immature Granulocytes: 1 %
Lymphocytes Relative: 16 %
Lymphs Abs: 1.5 10*3/uL (ref 0.7–4.0)
MCH: 29 pg (ref 26.0–34.0)
MCHC: 32.7 g/dL (ref 30.0–36.0)
MCV: 88.7 fL (ref 80.0–100.0)
Monocytes Absolute: 0.5 10*3/uL (ref 0.1–1.0)
Monocytes Relative: 5 %
Neutro Abs: 6.8 10*3/uL (ref 1.7–7.7)
Neutrophils Relative %: 74 %
Platelets: 351 10*3/uL (ref 150–400)
RBC: 3.97 MIL/uL (ref 3.87–5.11)
RDW: 15.4 % (ref 11.5–15.5)
WBC: 9.2 10*3/uL (ref 4.0–10.5)
nRBC: 0 % (ref 0.0–0.2)

## 2023-03-17 MED ORDER — TRAMADOL HCL 50 MG PO TABS: 50.0000 mg | ORAL_TABLET | Freq: Two times a day (BID) | ORAL | 0 refills | Status: AC

## 2023-03-17 MED ORDER — APIXABAN 5 MG PO TABS: 5.0000 mg | ORAL_TABLET | Freq: Two times a day (BID) | ORAL | 0 refills | Status: AC

## 2023-03-17 MED ORDER — OXYCODONE HCL 5 MG PO TABS: 5.0000 mg | ORAL_TABLET | Freq: Four times a day (QID) | ORAL | 0 refills | Status: AC | PRN

## 2023-03-17 MED ORDER — LORAZEPAM 0.5 MG PO TABS: 0.5000 mg | ORAL_TABLET | Freq: Three times a day (TID) | ORAL | 0 refills | Status: AC | PRN

## 2023-03-17 NOTE — Discharge Summary (Signed)
Physician Discharge Summary   Patient: Marilyn Andrews MRN: [id number]  DOB: Feb 12, 1936   Admit:     Date of Admission: 03/07/2023 Admitted from: home   Discharge: Date of discharge: 03/17/23 Disposition: Skilled nursing facility Condition at discharge: good  CODE STATUS: DNR- signed yellow form in chart      Discharge Physician: Sunnie Nielsen, DO [facility]     PCP: Center, [facility]  Recommendations for Outpatient Follow-up:  Follow up with PCP Center, [facility] in 1-2 weeks Please obtain labs/tests: CBC, BMP in 1-2 weeks Follow as directed w/ vascular surgery  Please follow up on the following pending results: none PCP AND OTHER OUTPATIENT PROVIDERS: SEE BELOW FOR SPECIFIC DISCHARGE INSTRUCTIONS PRINTED FOR PATIENT IN ADDITION TO GENERIC AVS PATIENT INFO     Discharge Instructions     Diet - low sodium heart healthy   Complete by: As directed    Increase activity slowly   Complete by: As directed          Discharge Diagnoses: Principal Problem:   Intractable nausea and vomiting Active Problems:   Acute pulmonary embolism (HCC)   HTN (hypertension)   GERD (gastroesophageal reflux disease)   Depression   Generalized weakness   Chronic pain   Erythrocytosis   Hypokalemia   CKD stage 3a, GFR 45-59 ml/min (HCC)   Pyuria   Restless leg       Hospital Course: [name] is a 87 year old female with history of diverticulosis with diverticulitis, hypertension, GERD, generalized weakness, chronic pain low back and bilateral knee pain, chronic osteoarthritis, erythrocytosis, who [facility] for chief concerns of intractable nausea and vomiting, left lower quadrant pain. Also (+)dysuria.  05/05: in ED, VSS, mild hypokalemia, mild AKI, abn UA w/ 6-10 WBC and many bacteria w/ 5 ketones, Procal <0.01. CXR no concerns. CT Abd/Pelv (+)Severe sigmoid diverticulosis, no acute  diverticulitis. Mild thickening of the sigmoid colonic wall, likely d/t diverticulosis, no perforation/abscess. (+)thrombus in the incompletely imaged right lower lobe pulmonary vessel concerning for pulmonary embolism. CT chest for further evaluation is suggested. DVT US LE (+)occlusive thrombus in the right femoral, popliteal, posterior tibial and peroneal veins. Given apixiban, potassium, 1L NS, nausea control. Admitted to hospitalist service. Follow BMP and plan CTA if renal fxn appropriate. 05/06: AKI resolved, CTA chest (+)saddle PE w/ RH strain. Vascular consulted - plan thrombectomy tomorrow. Heparin gtt started. Echo ordered.  05/07: Echo EF 60-65, no RWMA, mild LVH, G1DD, RV systolic fxn WNL, tricuspid regurg signal inadequate to assess PA pressure. Underwent thrombectomy of bilateral pulmonary arteries with placement of an Elite IVC filter 05/08-05/15: stable, awaiting SNF placement for STR, insurance auth     Consultants:  Vascular Surgery   Procedures: 05/07: thrombectomy of bilateral pulmonary arteries with placement of an Elite IVC filter      ASSESSMENT & PLAN:   Principal Problem:   Intractable nausea and vomiting Active Problems:   Acute pulmonary embolism (HCC)   HTN (hypertension)   GERD (gastroesophageal reflux disease)   Depression   Generalized weakness   Chronic pain   Erythrocytosis   Hypokalemia   CKD stage 3a, GFR 45-59 ml/min (HCC)   Pyuria   Restless leg   Acute submassive saddle pulmonary embolism (HCC) w/ RV strain  Acute occlusive thrombus in the right femoral, popliteal, posterior tibial and peroneal veins Eliquis  S/p thrombectomy and IVC placement 03/09/2023  Intractable nausea and vomiting Presumed  secondary to acute PE Symptomatic support with ondansetron 4 mg p.o./IV as needed for nausea and vomiting; Phenergan 12.5 mg IV every 6 hours as needed for refractory nausea and vomiting, 18 hours of coverage ordered IV fluids as needed   HTN  (hypertension) Amlodipine 10 mg daily resumed Hydralazine 5 mg IV every 8 hours as needed for SBP greater 175, 4 days ordered  GERD (gastroesophageal reflux disease) Protonix 40 mg IV nightly, 2 days ordered in setting of intractable nausea and vomiting  CKD stage 3a, GFR 45-59 ml/min (HCC) At baseline, with mild increase in serum creatinine and decrease in EGFR compared to prior, does not meet criteria for acute kidney injury; presumed secondary to prerenal, from GI loss Follow BMP   Pyuria but pt then denies dysuria ESBL likely colonization D/c abx   Restless leg Initiation of Requip 0.5 mg nightly, first dose today, 2 doses ordered Iron panel  Hypokalemia Suspect secondary to GI loss Check magnesium level on admission --> WNL Replace as needed Monitor BMP  Depression Escitalopram 20 mg daily resumed Lorazepam 0.5 mg every 6 hours as needed for anxiety resumed  Chronic pain Home tizanidine 2 mg every 8 hours as needed for muscle spasms resumed Tramadol 50 mg twice daily as needed for moderate pain resumed  Generalized weakness Fall precautions, aspiration, PT, OT, TOC consulted    DVT prophylaxis: n/a, treating w/ Eliquis  Pertinent IV fluids/nutrition: heart healthy diet  Central lines / invasive devices: none  Code Status: DNR ACP documentation reviewed: 05/15, none on file   Current Admission Status: inpatient   TOC needs / Dispo plan: to SNF/STR Barriers to discharge / significant pending items: pending insurance auth for SNF               Discharge Instructions  Allergies as of 03/17/2023       Reactions   Penicillins Other (See Comments)   Reaction as a child, patient does not recall Has patient had a PCN reaction causing immediate rash, facial/tongue/throat swelling, SOB or lightheadedness with hypotension: unknown Has patient had a PCN reaction causing severe rash involving mucus membranes or skin necrosis: unknown Has patient had a PCN  reaction that required hospitalization: unknown Has patient had a PCN reaction occurring within the last 10 years: No If all of the above answers are "NO", then may proceed with Cephalosporin use.   Sulfa Antibiotics Nausea Only        Medication List     STOP taking these medications    diphenhydrAMINE 50 MG tablet Commonly known as: BENADRYL   ibuprofen 200 MG tablet Commonly known as: ADVIL   methocarbamol 500 MG tablet Commonly known as: Robaxin   triamcinolone 0.025 % cream Commonly known as: KENALOG       TAKE these medications    acetaminophen 500 MG tablet Commonly known as: TYLENOL Take 1,000 mg by mouth every 6 (six) hours as needed for mild pain or moderate pain.   amLODipine 10 MG tablet Commonly known as: NORVASC Take 1 tablet (10 mg total) by mouth daily.   apixaban 5 MG Tabs tablet Commonly known as: ELIQUIS Take 1 tablet (5 mg total) by mouth 2 (two) times daily.   escitalopram 20 MG tablet Commonly known as: LEXAPRO Take 1 tablet (20 mg total) by mouth daily.   feeding supplement Liqd Take 237 mLs by mouth 2 (two) times daily between meals.   fluticasone 50 MCG/ACT nasal spray Commonly known as: FLONASE Place 2 sprays into  both nostrils 2 (two) times daily as needed for allergies or rhinitis.   LORazepam 0.5 MG tablet Commonly known as: ATIVAN Take 1 tablet (0.5 mg total) by mouth every 8 (eight) hours as needed for anxiety. What changed:  when to take this reasons to take this   meloxicam 15 MG tablet Commonly known as: MOBIC Take 15 mg by mouth daily.   multivitamin with minerals Tabs tablet Take 1 tablet by mouth daily.   naloxone 4 MG/0.1ML Liqd nasal spray kit Commonly known as: NARCAN Place 1 spray into the nose as needed (suspected opioid overdose).   ondansetron 8 MG disintegrating tablet Commonly known as: ZOFRAN-ODT Take 1 tablet (8 mg total) by mouth every 8 (eight) hours as needed for nausea or vomiting.    oxyCODONE 5 MG immediate release tablet Commonly known as: Oxy IR/ROXICODONE Take 1 tablet (5 mg total) by mouth every 6 (six) hours as needed for breakthrough pain (pain despite tramadol).   pantoprazole 40 MG tablet Commonly known as: PROTONIX Take 1 tablet (40 mg total) by mouth 2 (two) times daily.   polyethylene glycol 17 g packet Commonly known as: MIRALAX / GLYCOLAX Take 17 g by mouth once.   rosuvastatin 20 MG tablet Commonly known as: CRESTOR Take 20 mg by mouth at bedtime.   senna-docusate 8.6-50 MG tablet Commonly known as: Senokot-S Take 2 tablets by mouth 2 (two) times daily as needed for mild constipation. What changed: when to take this   tiZANidine 2 MG tablet Commonly known as: ZANAFLEX Take 2 mg by mouth 3 (three) times daily.   traMADol 50 MG tablet Commonly known as: ULTRAM Take 1 tablet (50 mg total) by mouth 2 (two) times daily. What changed:  when to take this reasons to take this         Contact information for after-discharge care     Destination     [facility] Preferred SNF .   Service: Skilled Nursing Contact information: [address] [place] [address] [phone]                     Allergies  Allergen Reactions   Penicillins Other (See Comments)    Reaction as a child, patient does not recall  Has patient had a PCN reaction causing immediate rash, facial/tongue/throat swelling, SOB or lightheadedness with hypotension: unknown Has patient had a PCN reaction causing severe rash involving mucus membranes or skin necrosis: unknown Has patient had a PCN reaction that required hospitalization: unknown Has patient had a PCN reaction occurring within the last 10 years: No If all of the above answers are "NO", then may proceed with Cephalosporin use.   Sulfa Antibiotics Nausea Only     Subjective: pt has no concerns today, denies CP/SOB   Discharge Exam: BP (!)  138/59 (BP Location: Right Arm)   Pulse 82   Temp 97.7 F (36.5 C)   Resp 17   Ht 5\' 3"  (1.6 m)   Wt 76.3 kg   SpO2 99%   BMI 29.80 kg/m  General: Pt is alert, awake, not in acute distress Cardiovascular: RRR, S1/S2 +, no rubs, no gallops Respiratory: CTA bilaterally, no wheezing, no rhonchi Abdominal: Soft, NT Extremities: no edema, no cyanosis     The results of significant diagnostics from this hospitalization (including imaging, microbiology, ancillary and laboratory) are listed below for reference.     Microbiology: Recent Results (from the past 240 hour(s))  Urine Culture  Status: Abnormal   Collection Time: 03/07/23 12:45 PM   Specimen: Urine, Random  Result Value Ref Range Status   Specimen Description   Final    URINE, RANDOM Performed at [facility], [address] [place], [place] [address]    Special Requests   Final    NONE Performed at [facility], [address] [place], [place] [address]    Culture (A)  Final    >=100,000 COLONIES/mL ESCHERICHIA COLI Confirmed Extended Spectrum Beta-Lactamase Producer (ESBL).  In bloodstream infections from ESBL organisms, carbapenems are preferred over piperacillin/tazobactam. They are shown to have a lower risk of mortality.    Report Status 03/09/2023 FINAL  Final   Organism ID, Bacteria ESCHERICHIA COLI (A)  Final      Susceptibility   Escherichia coli - MIC*    AMPICILLIN >=32 RESISTANT Resistant     CEFAZOLIN >=64 RESISTANT Resistant     CEFEPIME 16 RESISTANT Resistant     CEFTRIAXONE >=64 RESISTANT Resistant     CIPROFLOXACIN <=0.25 SENSITIVE Sensitive     GENTAMICIN <=1 SENSITIVE Sensitive     IMIPENEM <=0.25 SENSITIVE Sensitive     NITROFURANTOIN <=16 SENSITIVE Sensitive     TRIMETH/SULFA <=20 SENSITIVE Sensitive     AMPICILLIN/SULBACTAM 8 SENSITIVE Sensitive     PIP/TAZO <=4 SENSITIVE Sensitive     * >=100,000 COLONIES/mL ESCHERICHIA COLI     Labs: BNP (last 3 results) No  results for input(s): "BNP" in the last 8760 hours. Basic Metabolic Panel: Recent Labs  Lab 03/13/23 0518 03/14/23 0447 03/15/23 0540 03/16/23 0546 03/17/23 0450  NA 139 136 139 140 138  K 3.8 3.8 3.8 3.8 3.6  CL 110 107 108 110 107  CO2 26 24 26 25 22   GLUCOSE 107* 116* 99 90 90  BUN 10 10 11 11 9   CREATININE 0.77 0.66 0.74 0.70 0.68  CALCIUM 8.4* 8.4* 8.7* 8.8* 8.5*   Liver Function Tests: No results for input(s): "AST", "ALT", "ALKPHOS", "BILITOT", "PROT", "ALBUMIN" in the last 168 hours. No results for input(s): "LIPASE", "AMYLASE" in the last 168 hours. No results for input(s): "AMMONIA" in the last 168 hours. CBC: Recent Labs  Lab 03/11/23 0404 03/12/23 0600 03/13/23 0518 03/17/23 0450  WBC 9.2 8.8 8.4 9.2  NEUTROABS 6.4 6.0 5.7 6.8  HGB 11.3* 11.7* 10.8* 11.5*  HCT 34.6* 34.6* 32.8* 35.2*  MCV 86.9 85.0 86.5 88.7  PLT 262 293 299 351   Cardiac Enzymes: No results for input(s): "CKTOTAL", "CKMB", "CKMBINDEX", "TROPONINI" in the last 168 hours. BNP: Invalid input(s): "POCBNP" CBG: Recent Labs  Lab 03/16/23 0741  GLUCAP 79   D-Dimer No results for input(s): "DDIMER" in the last 72 hours. Hgb A1c No results for input(s): "HGBA1C" in the last 72 hours. Lipid Profile No results for input(s): "CHOL", "HDL", "LDLCALC", "TRIG", "CHOLHDL", "LDLDIRECT" in the last 72 hours. Thyroid function studies No results for input(s): "TSH", "T4TOTAL", "T3FREE", "THYROIDAB" in the last 72 hours.  Invalid input(s): "FREET3" Anemia work up No results for input(s): "VITAMINB12", "FOLATE", "FERRITIN", "TIBC", "IRON", "RETICCTPCT" in the last 72 hours. Urinalysis    Component Value Date/Time   COLORURINE AMBER (A) 03/07/2023 1245   APPEARANCEUR HAZY (A) 03/07/2023 1245   APPEARANCEUR Hazy 03/27/2013 1114   LABSPEC 1.035 (H) 03/07/2023 1245   LABSPEC 1.026 03/27/2013 1114   PHURINE 8.0 03/07/2023 1245   GLUCOSEU NEGATIVE 03/07/2023 1245   GLUCOSEU Negative 03/27/2013  1114   HGBUR NEGATIVE 03/07/2023 1245   BILIRUBINUR NEGATIVE  03/07/2023 1245   BILIRUBINUR Negative 03/27/2013 1114   KETONESUR 5 (A) 03/07/2023 1245   PROTEINUR NEGATIVE 03/07/2023 1245   NITRITE NEGATIVE 03/07/2023 1245   LEUKOCYTESUR SMALL (A) 03/07/2023 1245   LEUKOCYTESUR Negative 03/27/2013 1114   Sepsis Labs Recent Labs  Lab 03/11/23 0404 03/12/23 0600 03/13/23 0518 03/17/23 0450  WBC 9.2 8.8 8.4 9.2   Microbiology Recent Results (from the past 240 hour(s))  Urine Culture     Status: Abnormal   Collection Time: 03/07/23 12:45 PM   Specimen: Urine, Random  Result Value Ref Range Status   Specimen Description   Final    URINE, RANDOM Performed at [facility], [address] [place], [place] [address]    Special Requests   Final    NONE Performed at [facility], [address] [place], [place] [address]    Culture (A)  Final    >=100,000 COLONIES/mL ESCHERICHIA COLI Confirmed Extended Spectrum Beta-Lactamase Producer (ESBL).  In bloodstream infections from ESBL organisms, carbapenems are preferred over piperacillin/tazobactam. They are shown to have a lower risk of mortality.    Report Status 03/09/2023 FINAL  Final   Organism ID, Bacteria ESCHERICHIA COLI (A)  Final      Susceptibility   Escherichia coli - MIC*    AMPICILLIN >=32 RESISTANT Resistant     CEFAZOLIN >=64 RESISTANT Resistant     CEFEPIME 16 RESISTANT Resistant     CEFTRIAXONE >=64 RESISTANT Resistant     CIPROFLOXACIN <=0.25 SENSITIVE Sensitive     GENTAMICIN <=1 SENSITIVE Sensitive     IMIPENEM <=0.25 SENSITIVE Sensitive     NITROFURANTOIN <=16 SENSITIVE Sensitive     TRIMETH/SULFA <=20 SENSITIVE Sensitive     AMPICILLIN/SULBACTAM 8 SENSITIVE Sensitive     PIP/TAZO <=4 SENSITIVE Sensitive     * >=100,000 COLONIES/mL ESCHERICHIA COLI   Imaging ECHOCARDIOGRAM LIMITED  Result Date: 03/12/2023    ECHOCARDIOGRAM LIMITED REPORT   Patient Name:   AIZLYN GOTCH Vantol Date of Exam:  03/12/2023 Medical Rec #:  [id number]    Height:       63.0 in Accession #:    [id number]   Weight:       164.2 lb Date of Birth:  02-06-1936    BSA:          1.778 m Patient Age:    87 years     BP:           134/62 mmHg Patient Gender: F            HR:           79 bpm. Exam Location:  [facility] Procedure: Limited Echo and Saline Contrast Bubble Study Indications:     Evaluation for PFO  History:         Patient has prior history of Echocardiogram examinations, most                  recent 03/08/2023. Risk Factors:Hypertension. Pulmonary embolus,                  possible PFO.  Sonographer:     Mikki Harbor Referring Phys:  [id number] Antonieta Iba Diagnosing Phys: Julien Nordmann MD  Sonographer Comments: Technically difficult study due to poor echo windows and suboptimal apical window. IMPRESSIONS  1. Left ventricular ejection fraction, by estimation, is 60 to 65%. The left ventricle has normal function. The left ventricle has no regional wall motion abnormalities.  2. Right ventricular systolic function is  normal. The right ventricular size is normal. Tricuspid regurgitation signal is inadequate for assessing PA pressure.  3. The mitral valve is normal in structure. No evidence of mitral stenosis.  4. The aortic valve is normal in structure. No aortic stenosis is present.  5. The inferior vena cava is normal in size with greater than 50% respiratory variability, suggesting right atrial pressure of 3 mmHg.  6. Agitated saline contrast bubble study was negative, with no evidence of any interatrial shunt. FINDINGS  Left Ventricle: Left ventricular ejection fraction, by estimation, is 60 to 65%. The left ventricle has normal function. The left ventricle has no regional wall motion abnormalities. The left ventricular internal cavity size was normal in size. There is  no left ventricular hypertrophy. Right Ventricle: The right ventricular size is normal. No increase in right ventricular wall thickness. Right ventricular  systolic function is normal. Tricuspid regurgitation signal is inadequate for assessing PA pressure. Left Atrium: Left atrial size was normal in size. Right Atrium: Right atrial size was normal in size. Pericardium: There is no evidence of pericardial effusion. Mitral Valve: The mitral valve is normal in structure. No evidence of mitral valve stenosis. Tricuspid Valve: The tricuspid valve is normal in structure. No evidence of tricuspid stenosis. Aortic Valve: The aortic valve is normal in structure. No aortic stenosis is present. Pulmonic Valve: The pulmonic valve was normal in structure. No evidence of pulmonic stenosis. Aorta: The aortic root is normal in size and structure. Venous: The inferior vena cava is normal in size with greater than 50% respiratory variability, suggesting right atrial pressure of 3 mmHg. IAS/Shunts: No atrial level shunt detected by color flow Doppler. Agitated saline contrast was given intravenously to evaluate for intracardiac shunting. Agitated saline contrast bubble study was negative, with no evidence of any interatrial shunt. There  is no evidence of a patent foramen ovale. There is no evidence of an atrial septal defect. LEFT VENTRICLE PLAX 2D LVIDd:         4.00 cm LVIDs:         2.70 cm LV PW:         1.10 cm LV IVS:        1.00 cm LVOT diam:     2.00 cm LVOT Area:     3.14 cm  LEFT ATRIUM         Index LA diam:    2.60 cm 1.46 cm/m   AORTA Ao Root diam: 3.00 cm  SHUNTS Systemic Diam: 2.00 cm Julien Nordmann MD Electronically signed by Julien Nordmann MD Signature Date/Time: 03/12/2023/12:02:45 PM    Final       Time coordinating discharge: over 30 minutes  SIGNED:  Sunnie Nielsen DO [facility]

## 2023-03-17 NOTE — TOC Transition Note (Signed)
Transition of Care [facility]) - CM/SW Discharge Note   Patient Details  Name: Marilyn Andrews MRN: [id number] Date of Birth: 01-28-1936  [facility] CM/SW Contact:  Allena Katz, LCSW Phone Number: 03/17/2023, 11:02 AM   Clinical Narrative:   Pt has orders to discharge to [facility]. Marcelino Duster at [facility] notified. RN given number for report. Pt going to room 702. Dc summary sent. CSW let pt's son know about discharge. Son is interested In extra help for her at home when she leaves facility. Son agreeable to helper bees referral to see if she qualifies for aide services at discharge. Son reports he is concerned after discharge that she will need more assistance and is working with DSS to get her qualified for medicaid. Son states she had meals on wheels prior to coming to hospital.    Final next level of care: Skilled Nursing Facility Barriers to Discharge: Barriers Resolved   Patient Goals and CMS Choice CMS Medicare.gov Compare Post Acute Care list provided to:: Patient Represenative (must comment) (daughter)    Discharge Placement                  Patient to be transferred to facility by: [facility]   Patient and family notified of of transfer: 03/17/23  Discharge Plan and Services Additional resources added to the After Visit Summary for                                       Social Determinants of Health (SDOH) Interventions SDOH Screenings   Food Insecurity: No Food Insecurity (03/07/2023)  Housing: Low Risk  (03/07/2023)  Transportation Needs: No Transportation Needs (03/07/2023)  Utilities: Not At Risk (03/07/2023)  Tobacco Use: Low Risk  (03/10/2023)     Readmission Risk Interventions     No data to display

## 2023-03-17 NOTE — Progress Notes (Signed)
Patient being discharged via EMS to [facility]. Left message with secretary with number to call because no one was able to take report at time called. All belongings sent with patient. IV removed.

## 2023-03-18 ENCOUNTER — Telehealth: Payer: Self-pay | Admitting: Physician Assistant

## 2023-03-18 NOTE — Telephone Encounter (Signed)
I have attempted to contact pt regarding her appt. We have scheduled her appt from 5/15 to 5/22 due to her being hospitalized. There is VM available. Will send appointment reminder via mail.

## 2023-03-24 ENCOUNTER — Telehealth: Payer: Self-pay

## 2023-03-24 ENCOUNTER — Inpatient Hospital Stay: Payer: Medicare HMO | Attending: Oncology | Admitting: Oncology

## 2023-03-24 NOTE — Telephone Encounter (Signed)
Spoke to son who stated patient is in [place] and wasn't able to make today's appointment. He has a meeting tomorrow at the facility to discuss her care and he will request if they can transport her. He will then call scheduler back to reschedule.

## 2023-04-12 ENCOUNTER — Encounter: Payer: Self-pay | Admitting: Oncology

## 2023-04-12 ENCOUNTER — Inpatient Hospital Stay: Payer: Medicare HMO | Attending: Oncology | Admitting: Oncology
